# Patient Record
Sex: Female | Born: 1960 | Race: White | Hispanic: No | Marital: Married | Smoking: Never smoker
Health system: Southern US, Community
[De-identification: ages and names within clinical notes are randomized; demographics above are authoritative.]

## PROBLEM LIST (undated history)

## (undated) DIAGNOSIS — Z1501 Genetic susceptibility to malignant neoplasm of breast: Secondary | ICD-10-CM

## (undated) DIAGNOSIS — Z9889 Other specified postprocedural states: Secondary | ICD-10-CM

## (undated) DIAGNOSIS — N6019 Diffuse cystic mastopathy of unspecified breast: Secondary | ICD-10-CM

## (undated) DIAGNOSIS — Z808 Family history of malignant neoplasm of other organs or systems: Secondary | ICD-10-CM

## (undated) DIAGNOSIS — T4145XA Adverse effect of unspecified anesthetic, initial encounter: Secondary | ICD-10-CM

## (undated) DIAGNOSIS — Z515 Encounter for palliative care: Secondary | ICD-10-CM

## (undated) DIAGNOSIS — F329 Major depressive disorder, single episode, unspecified: Secondary | ICD-10-CM

## (undated) DIAGNOSIS — T8859XA Other complications of anesthesia, initial encounter: Secondary | ICD-10-CM

## (undated) DIAGNOSIS — J45909 Unspecified asthma, uncomplicated: Secondary | ICD-10-CM

## (undated) DIAGNOSIS — F32A Depression, unspecified: Secondary | ICD-10-CM

## (undated) DIAGNOSIS — IMO0002 Reserved for concepts with insufficient information to code with codable children: Secondary | ICD-10-CM

## (undated) DIAGNOSIS — J329 Chronic sinusitis, unspecified: Secondary | ICD-10-CM

## (undated) DIAGNOSIS — Z1589 Genetic susceptibility to other disease: Secondary | ICD-10-CM

## (undated) DIAGNOSIS — J302 Other seasonal allergic rhinitis: Secondary | ICD-10-CM

## (undated) DIAGNOSIS — G893 Neoplasm related pain (acute) (chronic): Secondary | ICD-10-CM

## (undated) DIAGNOSIS — S0300XA Dislocation of jaw, unspecified side, initial encounter: Secondary | ICD-10-CM

## (undated) DIAGNOSIS — R112 Nausea with vomiting, unspecified: Secondary | ICD-10-CM

## (undated) HISTORY — DX: Diffuse cystic mastopathy of unspecified breast: N60.19

## (undated) HISTORY — DX: Encounter for palliative care: Z51.5

## (undated) HISTORY — DX: Genetic susceptibility to other disease: Z15.89

## (undated) HISTORY — DX: Genetic susceptibility to malignant neoplasm of breast: Z15.01

## (undated) HISTORY — DX: Reserved for concepts with insufficient information to code with codable children: IMO0002

## (undated) HISTORY — PX: BREAST BIOPSY: SHX20

## (undated) HISTORY — PX: BREAST CYST ASPIRATION: SHX578

## (undated) HISTORY — DX: Chronic sinusitis, unspecified: J32.9

## (undated) HISTORY — DX: Unspecified asthma, uncomplicated: J45.909

## (undated) HISTORY — DX: Other seasonal allergic rhinitis: J30.2

## (undated) HISTORY — PX: LASIK: SHX215

## (undated) HISTORY — DX: Depression, unspecified: F32.A

## (undated) HISTORY — DX: Dislocation of jaw, unspecified side, initial encounter: S03.00XA

## (undated) HISTORY — DX: Neoplasm related pain (acute) (chronic): G89.3

## (undated) HISTORY — DX: Major depressive disorder, single episode, unspecified: F32.9

## (undated) HISTORY — DX: Family history of malignant neoplasm of other organs or systems: Z80.8

## (undated) MED FILL — Fosaprepitant Dimeglumine For IV Infusion 150 MG (Base Eq): INTRAVENOUS | Qty: 5 | Status: AC

---
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
## 1989-02-23 HISTORY — PX: TUBAL LIGATION: SHX77

## 1989-09-30 DIAGNOSIS — O321XX0 Maternal care for breech presentation, not applicable or unspecified: Secondary | ICD-10-CM

## 1994-02-23 HISTORY — PX: RHINOPLASTY: SUR1284

## 1999-02-24 DIAGNOSIS — J45909 Unspecified asthma, uncomplicated: Secondary | ICD-10-CM

## 1999-02-24 HISTORY — PX: OTHER SURGICAL HISTORY: SHX169

## 1999-02-24 HISTORY — DX: Unspecified asthma, uncomplicated: J45.909

## 2001-09-21 IMAGING — MG MM CAD DIAGNOSTIC MAMMO
1 series · 6 of 6 positions shown · non-contrast
Comparison: none

REASON FOR EXAM: Left brt mass...hm [PHONE_NUMBER]...[HOSPITAL] EMP CAT 2

[Series 7520: R CC · right · 6 of 6 slices shown]
[im 1/6]
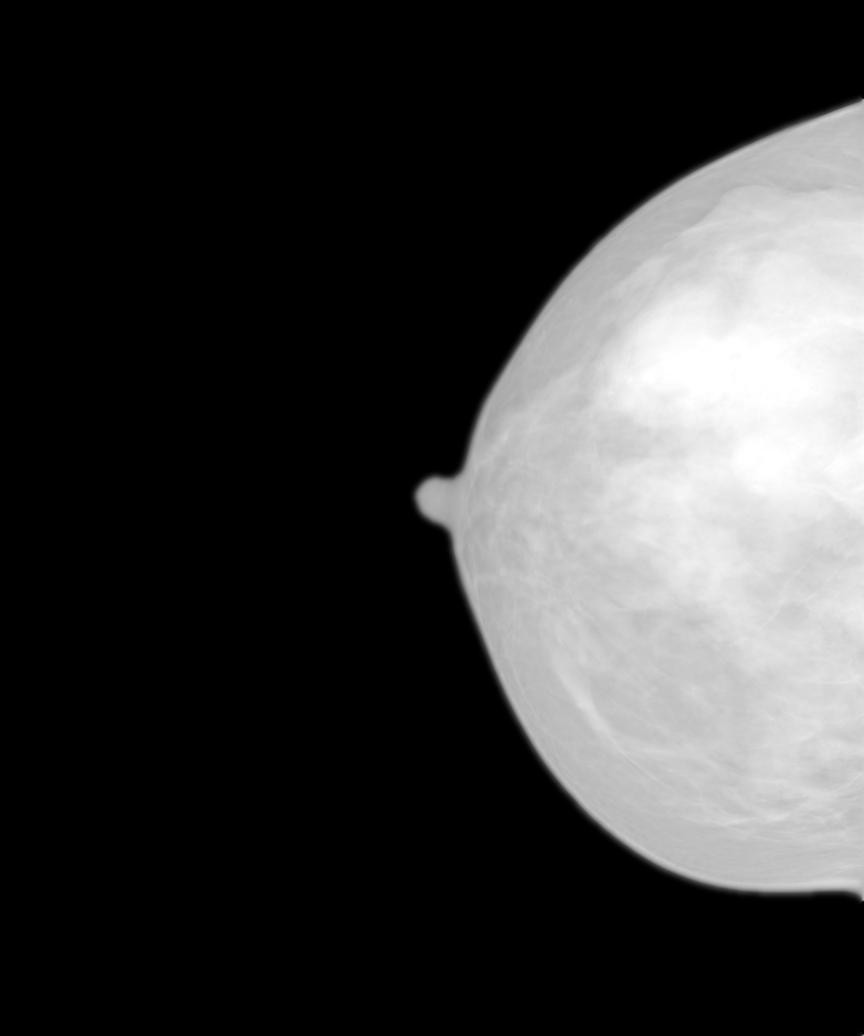
[im 2/6]
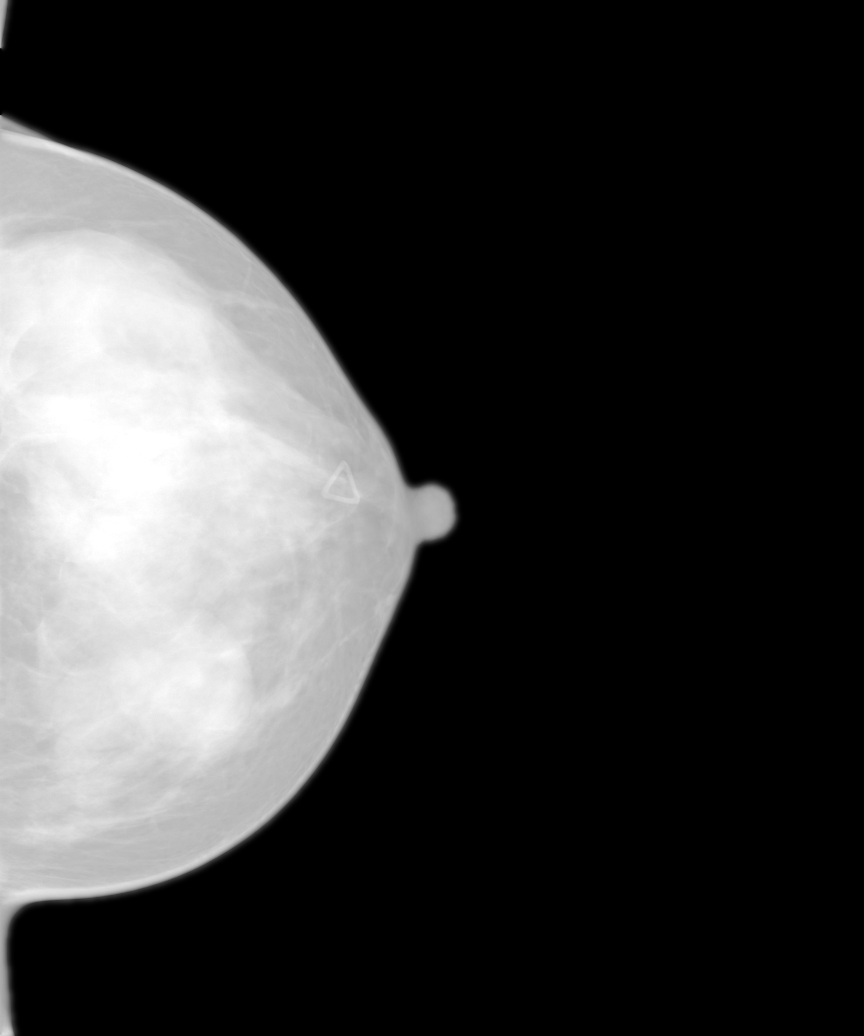
[im 3/6]
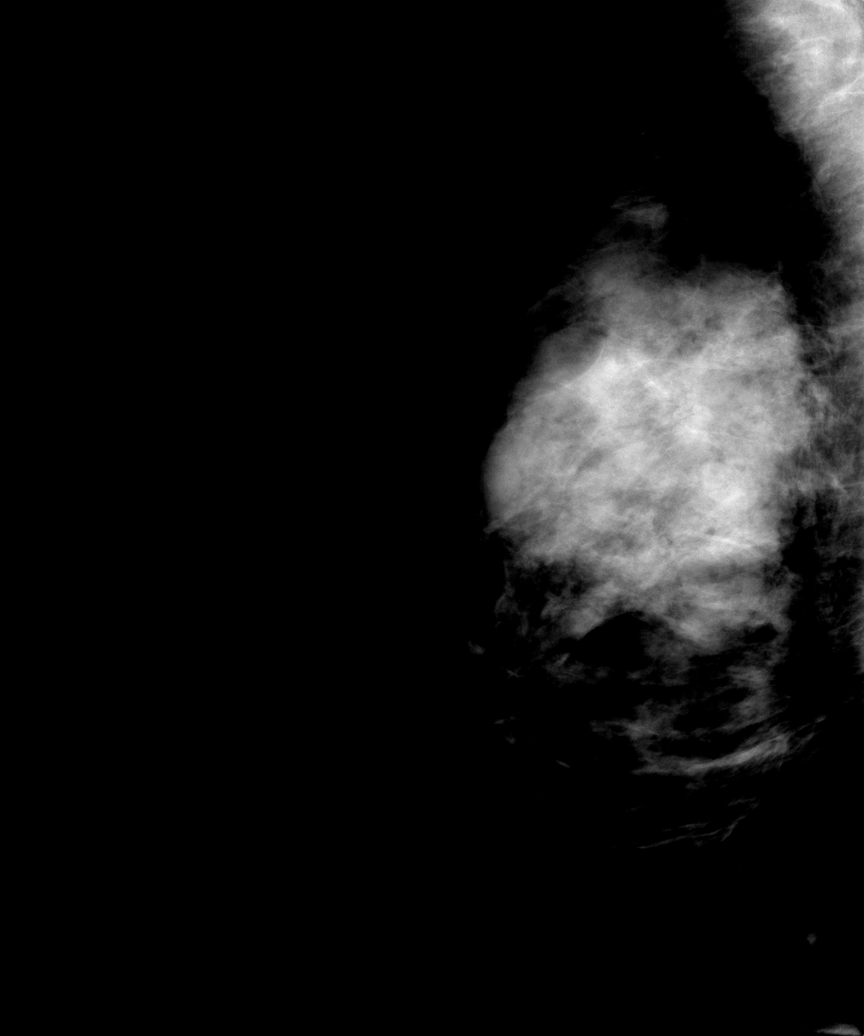
[im 4/6]
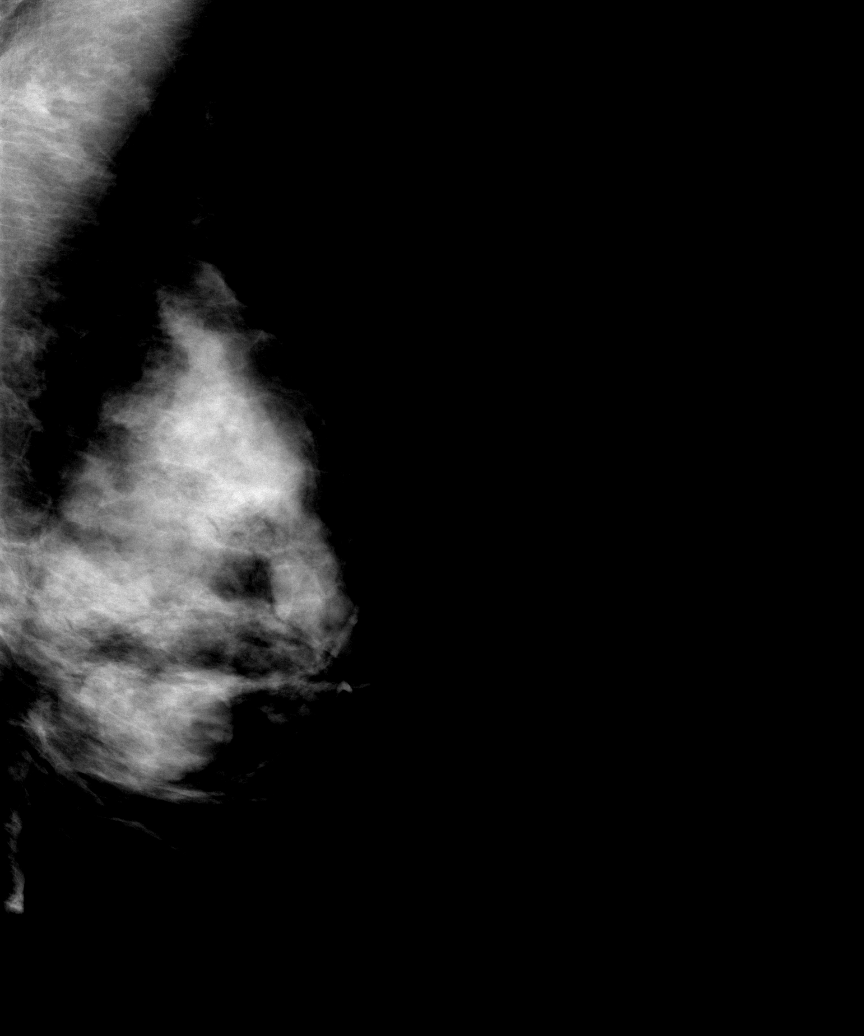
[im 5/6]
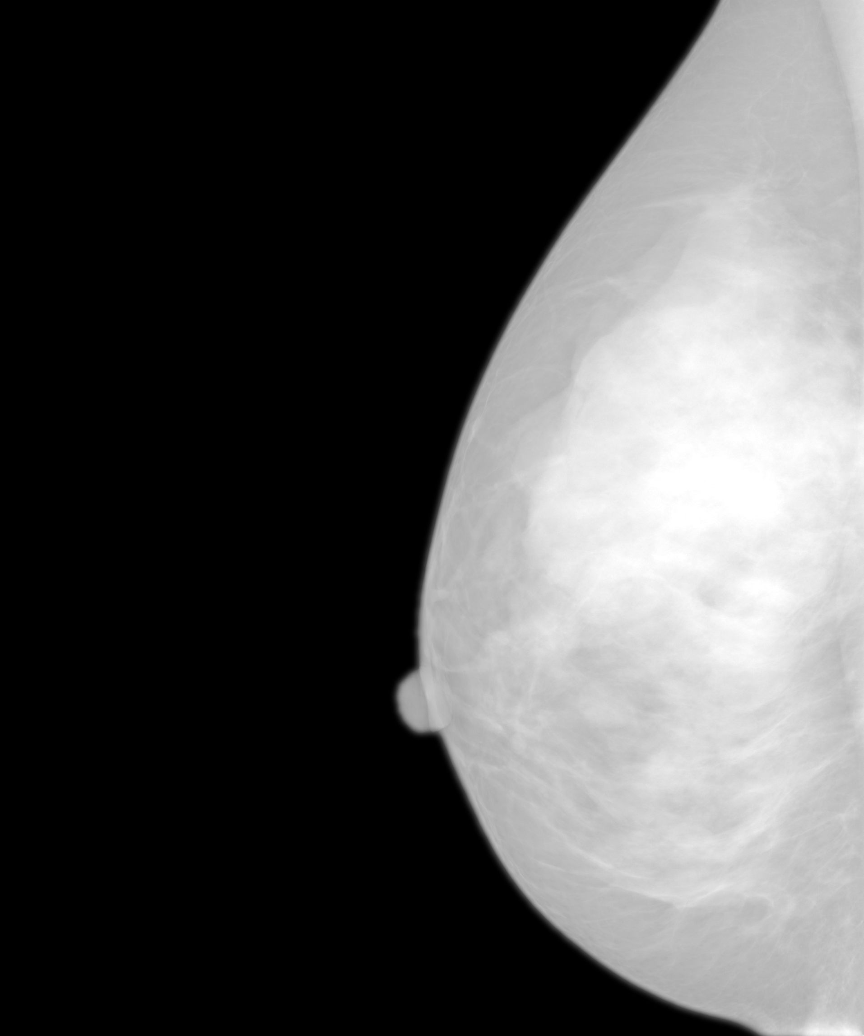
[im 6/6]
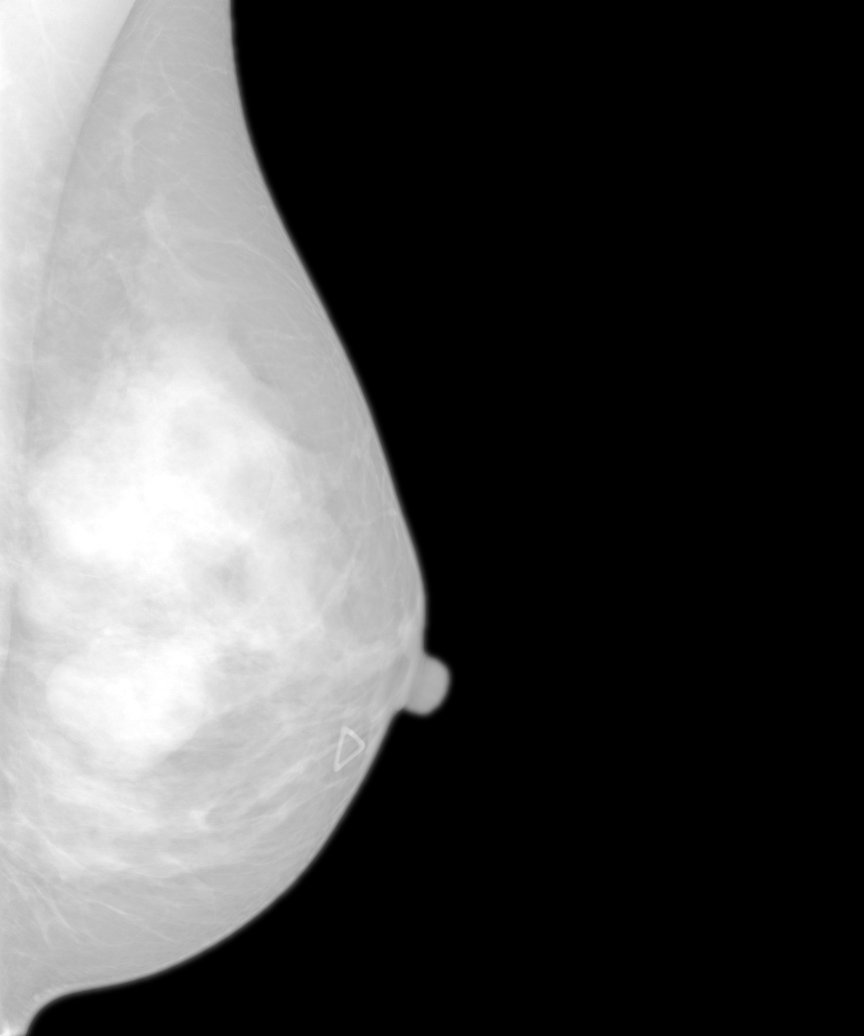

[6 of 6 positions shown; findings below may reference images not displayed]

Procedure: DIGITAL BILATERAL DIAGNOSTIC MAMMOGRAPHY WITH CAD

 Noted is a new prominent mass lesion in the LEFT breast inferiorly and
medially measuring approximately 3.0 cm consistent with a large cyst. This
was confirmed by ultrasound. Multiple tiny cysts are present throughout the
remainder of the breast. There are multiple RIGHT and LEFT breast masses
which are stable.
IMPRESSION: Large cyst, LEFT breast confirmed by ultrasound. Otherwise stable exam with
multiple masses bilaterally consistent with multiple cysts. BI-RADS:
Category 2-Benign Findings.
 A NEGATIVE MAMMOGRAM REPORT DOES NOT PRECLUDE BIOPSY OR OTHER EVALUATION OF
A CLINICALLY PALPABLE OR OTHERWISE SUSPICIOUS MASS OR LESION. BREAST CANCER
MAY NOT BE DETECTED BY MAMMOGRAPHY IN UP TO 10% OF CASES.

## 2003-01-15 IMAGING — MG MM CAD SCREENING MAMMO
1 series · 6 of 6 positions shown · non-contrast
Comparison: none

REASON FOR EXAM: screening

Procedure: BILATERAL DIGITAL SCREENING MAMMOGRAPHY WITH CAD

[R CC · right · 6 of 6 slices shown]
[im 1/6]
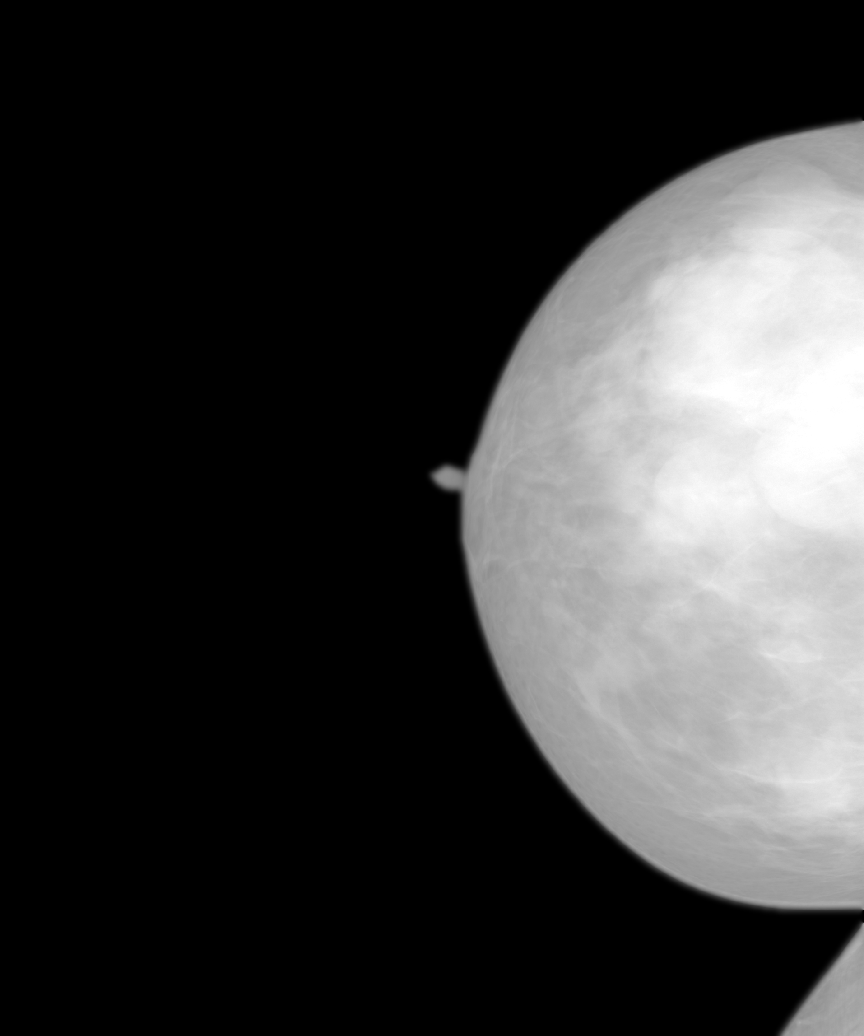
[im 2/6]
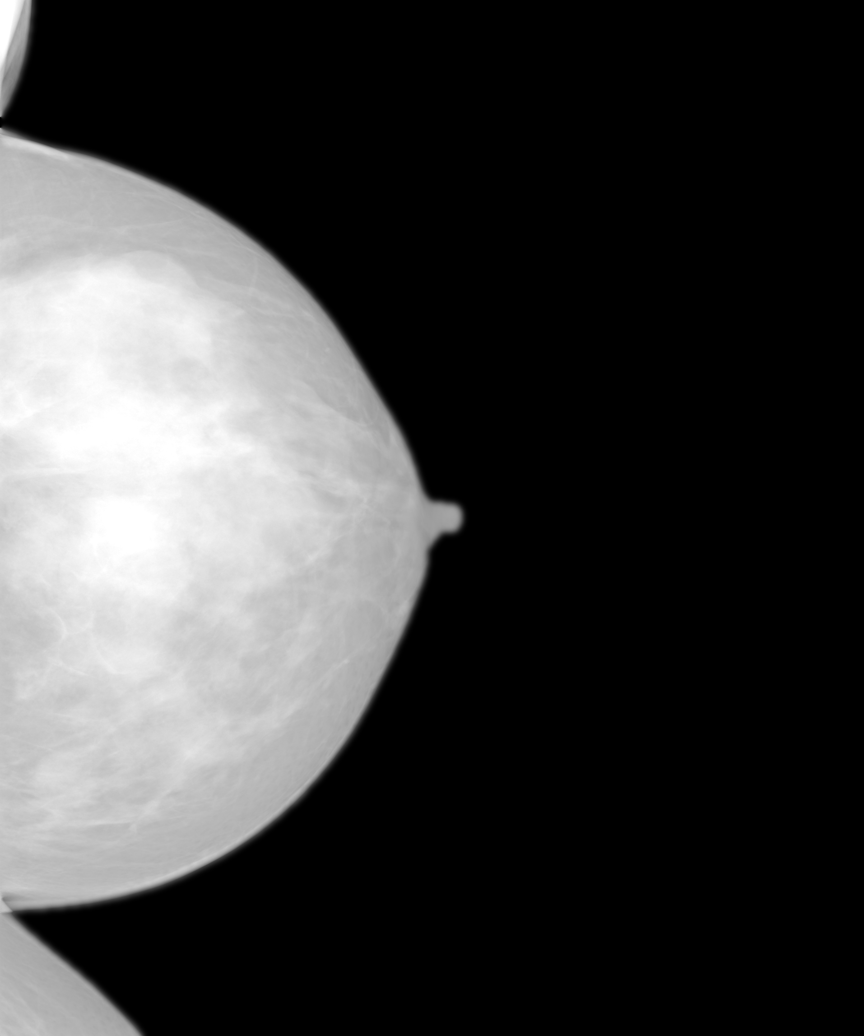
[im 3/6]
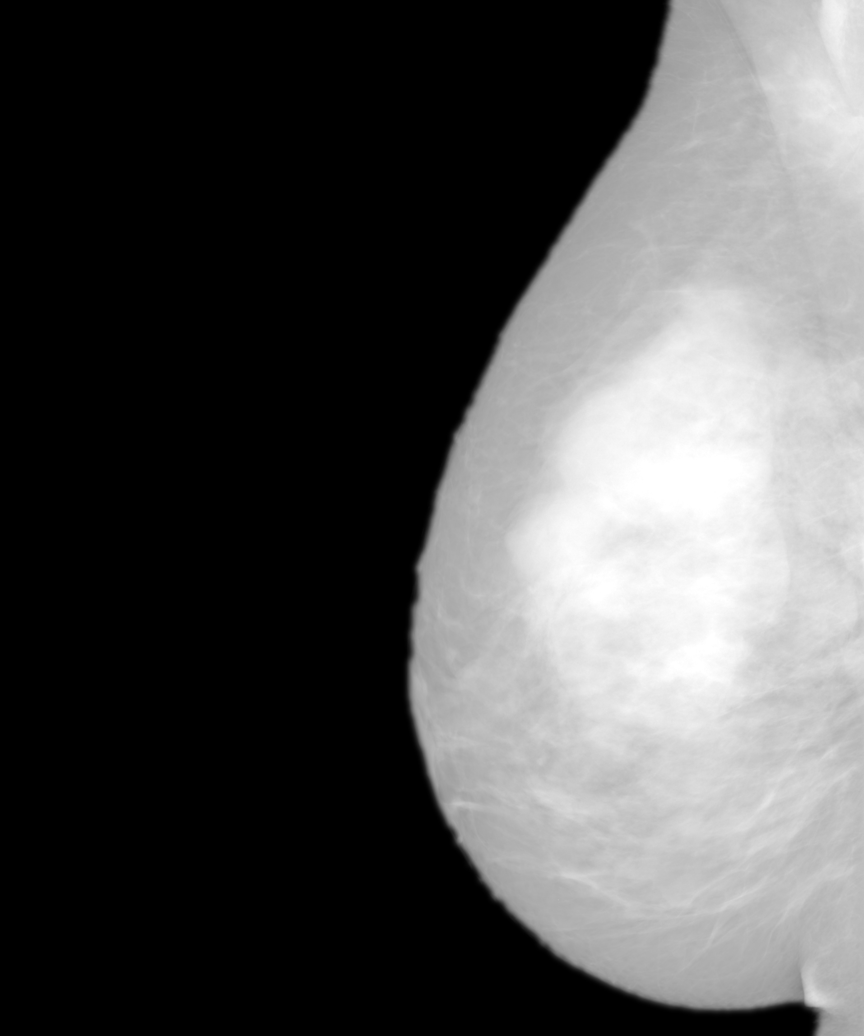
[im 4/6]
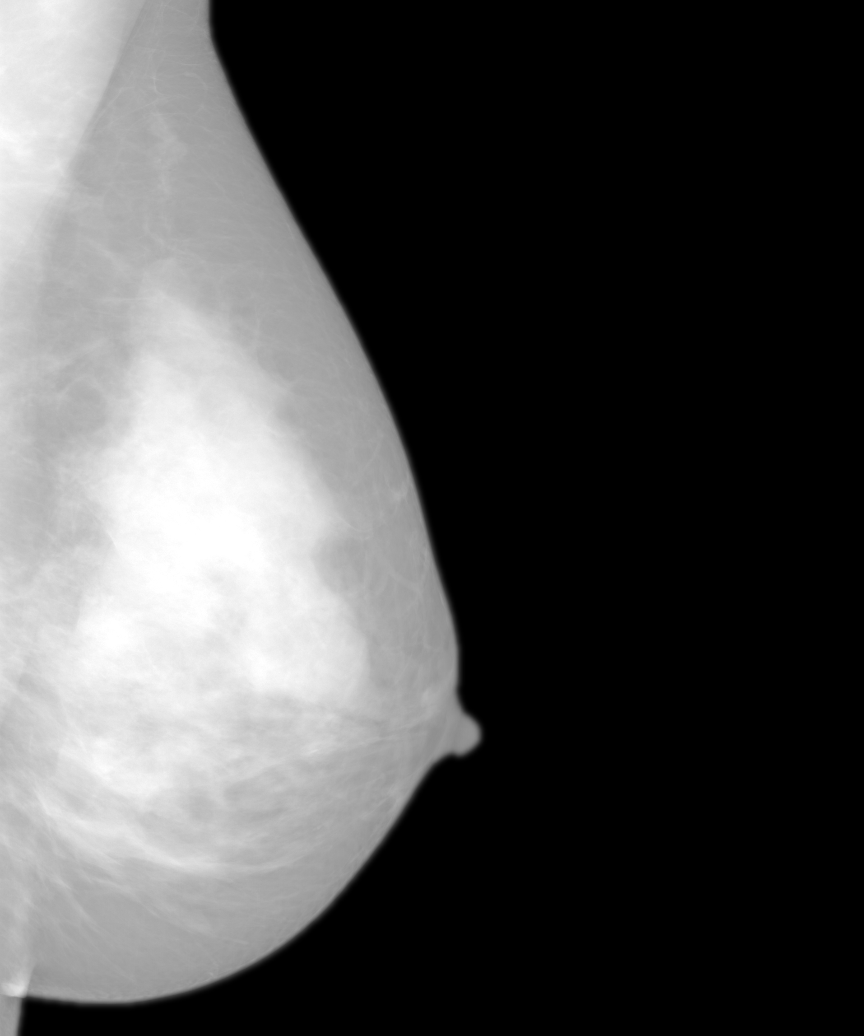
[im 5/6]
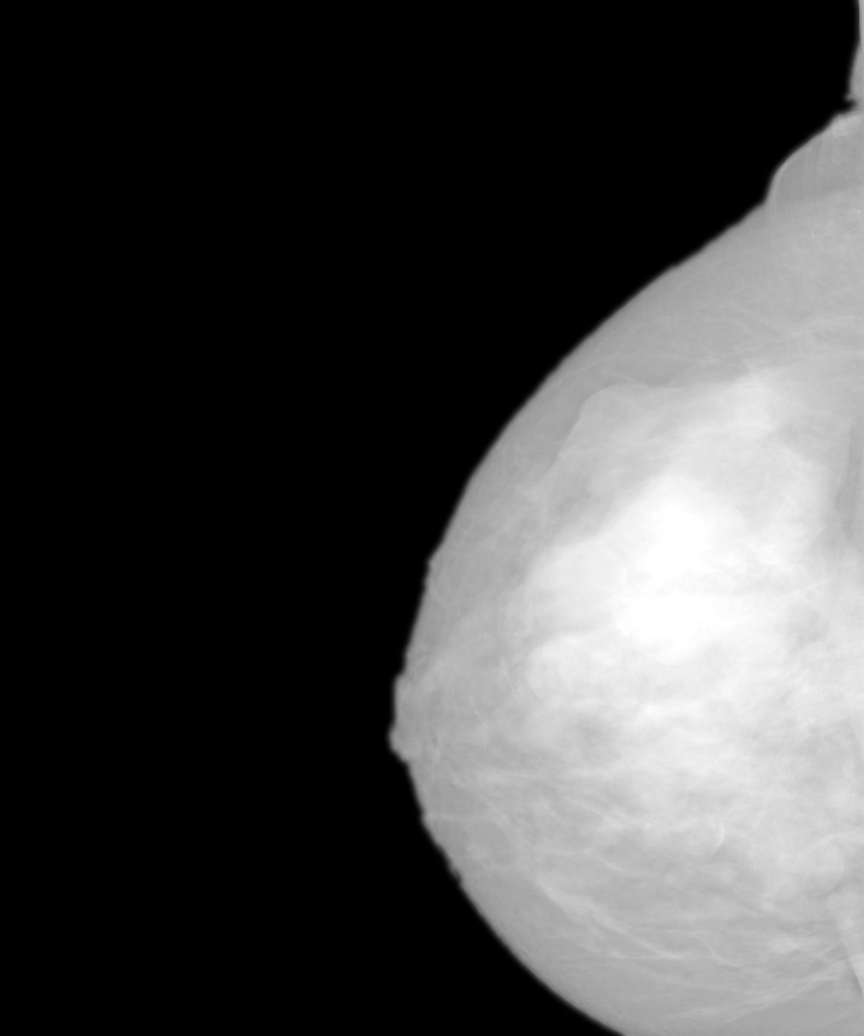
[im 6/6]
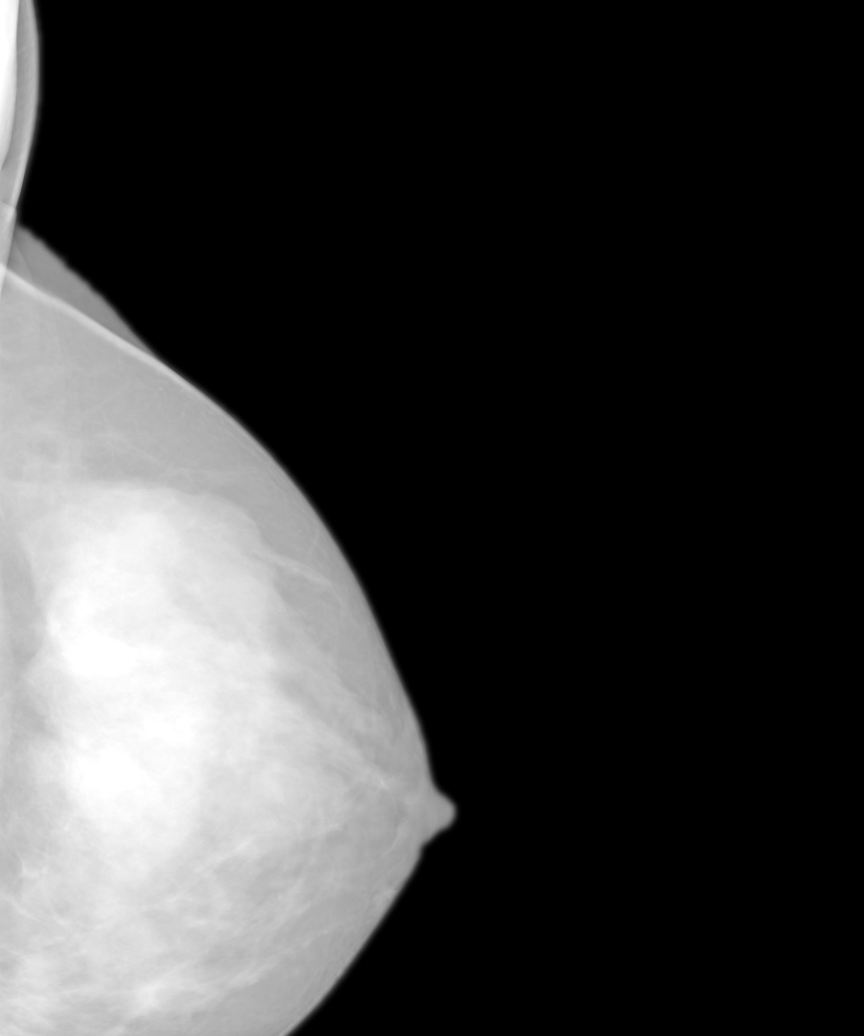

[6 of 6 positions shown; findings below may reference images not displayed]

FINDINGS: Comparison is made to the study of [DATE] as well as to the
film screen exam of [DATE].

 The breasts exhibit a dense parenchymal pattern which limits the
sensitivity for early detection of malignancy or subtle change compared to
prior exams. The CC image on todays examination shows an area of slightly
increased density with some associated calcifications that appear to be
slightly different than that seen on the prior studies. This may be
secondary to projection or differences in compression. Additional
magnification compression views of this area would be suggested. The area is
marked on the CC view and stored electronically. A similar appearing density
is seen on the MLO view and additional magnification compression in that
projection is also recommended.
IMPRESSION: Area of slightly increased parenchymal density seen on the current scan
which is not definitely correlated with areas on the prior study. This may
be secondary to differences in compression compared to the prior exam and
additional magnification compression views of the RIGHT breast would be
suggested in the areas indicated. Mediolateral view of the RIGHT breast
would also be considered.

 BI-RADS: Category 0 - Needs Additional Imaging Evaluation. Please have the
patient return for additional views of the RIGHT breast.

 A NEGATIVE MAMMOGRAM REPORT DOES NOT PRECLUDE BIOPSY OR OTHER EVALUATION OF
CLINICALLY PALPABLE OR OTHERWISE SUSPICIOUS MASS OR LESION. BREAST CANCER
MAY NOT BE DETECTED BY MAMMOGRAPHY IN UP TO 10% OF CASES.

## 2003-02-05 IMAGING — MG MM ADDITIONAL VIEWS AT NO CHARGE
1 series · 3 of 3 positions shown · non-contrast
Comparison: none

REASON FOR EXAM: DENSITY..cat 2

Procedure: DIGITAL RIGHT BREAST MAMMOGRAM, ADDITIONAL VIEWS

[R ML · right · 3 of 3 slices shown]
[im 1/3]
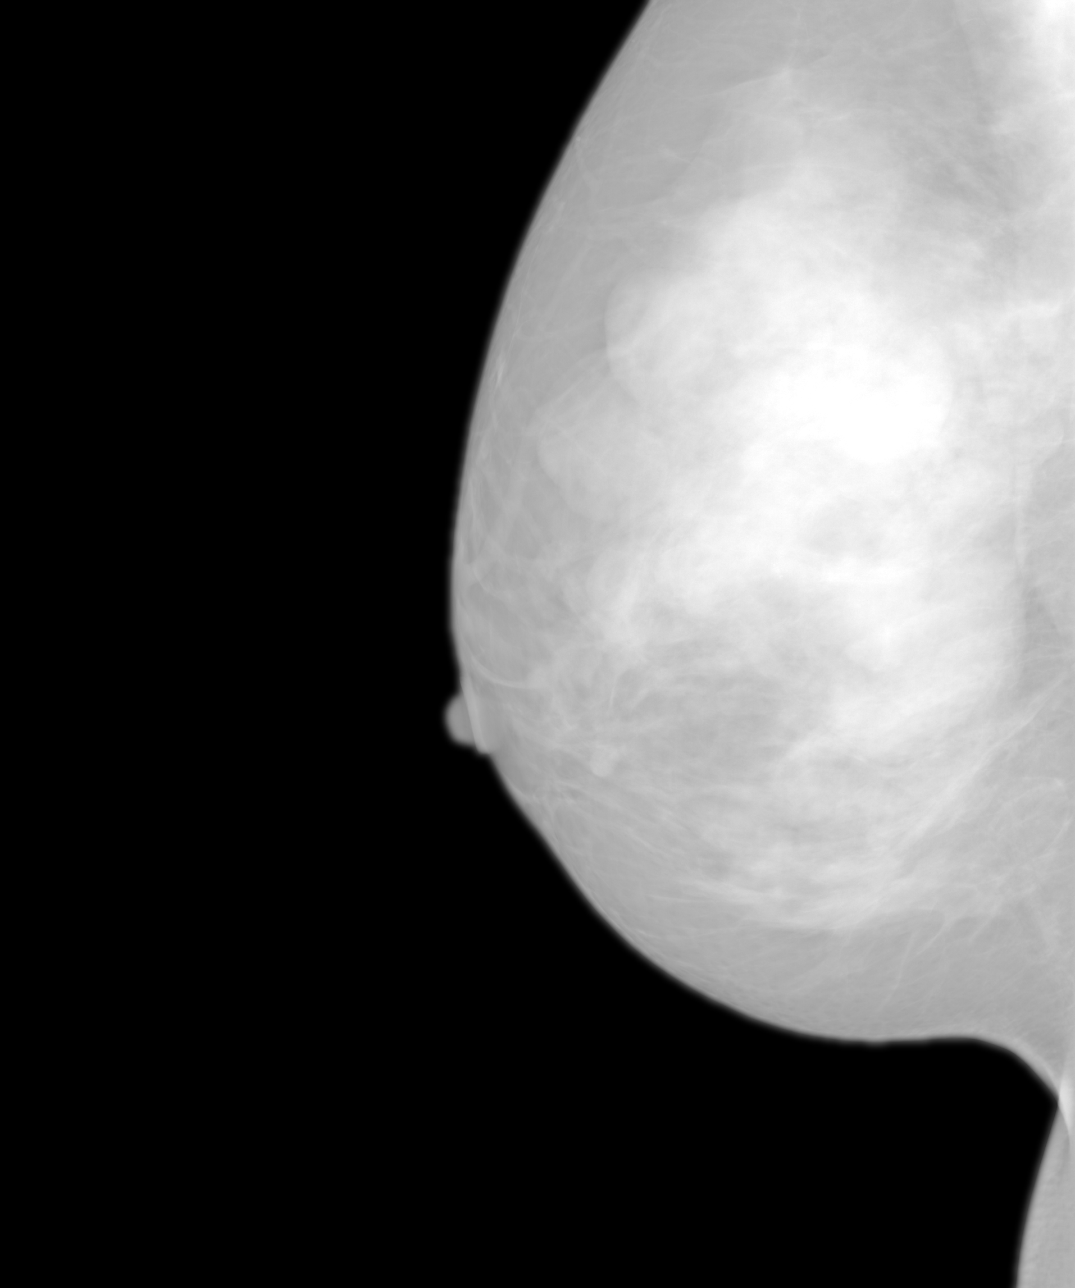
[im 2/3]
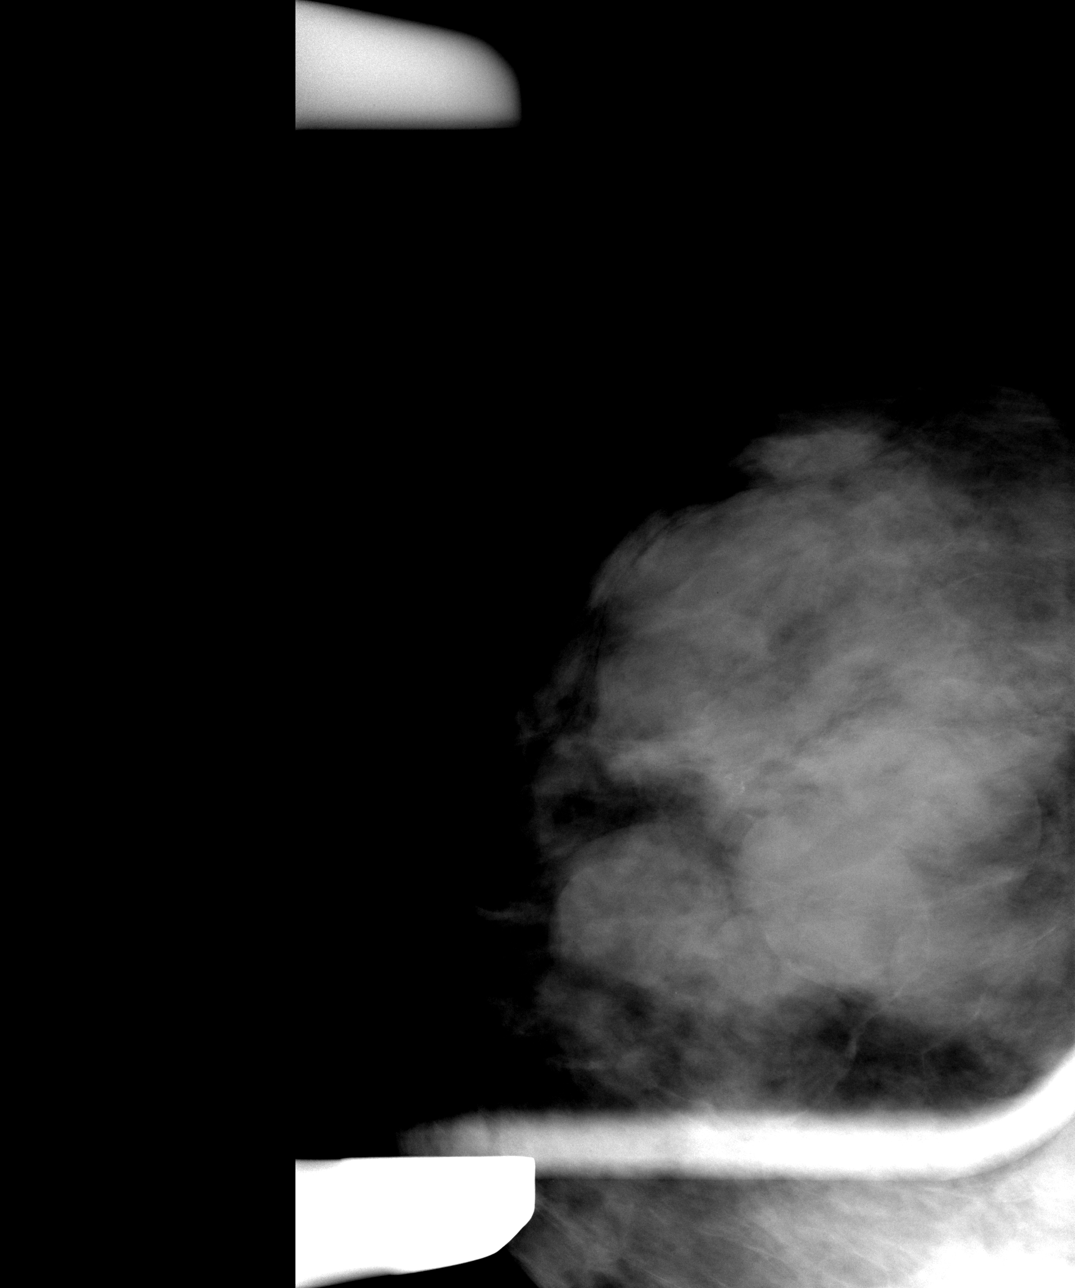
[im 3/3]
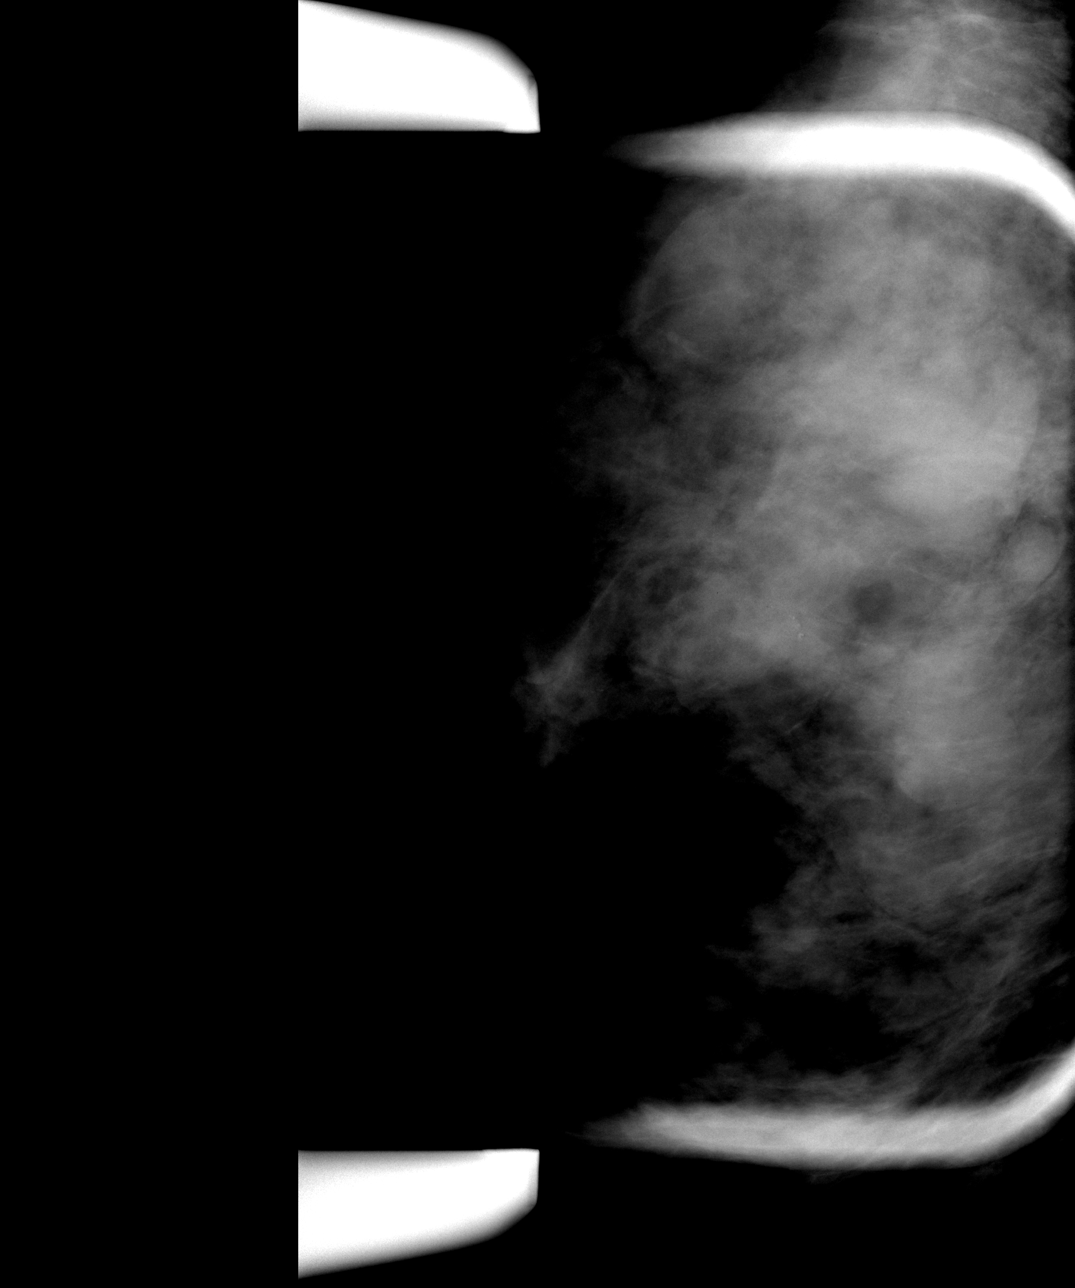

[3 of 3 positions shown; findings below may reference images not displayed]

FINDINGS: The patient returned for additional mediolateral and
magnification compression mediolateral and craniocaudal projections.
Ultrasound was also performed.

 The additional views demonstrate microcalcifications which are faintly seen
in the area of what appear to be some architectural distortion in the upper
outer quadrant. This appears to be at approximately the 9 oclock to 10
oclock position. The breasts show a dense parenchymal configuration. The
ultrasound demonstrates multiple cysts as described in a separate dictation.
IMPRESSION: Abnormal additional views with microcalcifications present at approximately
the 9 oclock position in the RIGHT breast. <pp>
 BI-RADS: Category 4 - Suspicious Abnormality. Surgical consultation is
suggested.

 A NEGATIVE MAMMOGRAM REPORT DOES NOT PRECLUDE BIOPSY OR OTHER EVALUATION OF
CLINICALLY PALPABLE OR OTHERWISE SUSPICIOUS MASS OR LESION. BREAST CANCER
MAY NOT BE DETECTED BY MAMMOGRAPHY IN UP TO 10% OF CASES.

## 2003-03-02 IMAGING — MG MM BREAST NEEDLE LOCALIZATION*R*
1 series · 3 of 3 positions shown · non-contrast
Comparison: none

REASON FOR EXAM: breast mass microcalcifications

[R LM · right · 3 of 3 slices shown]
[im 1/3]
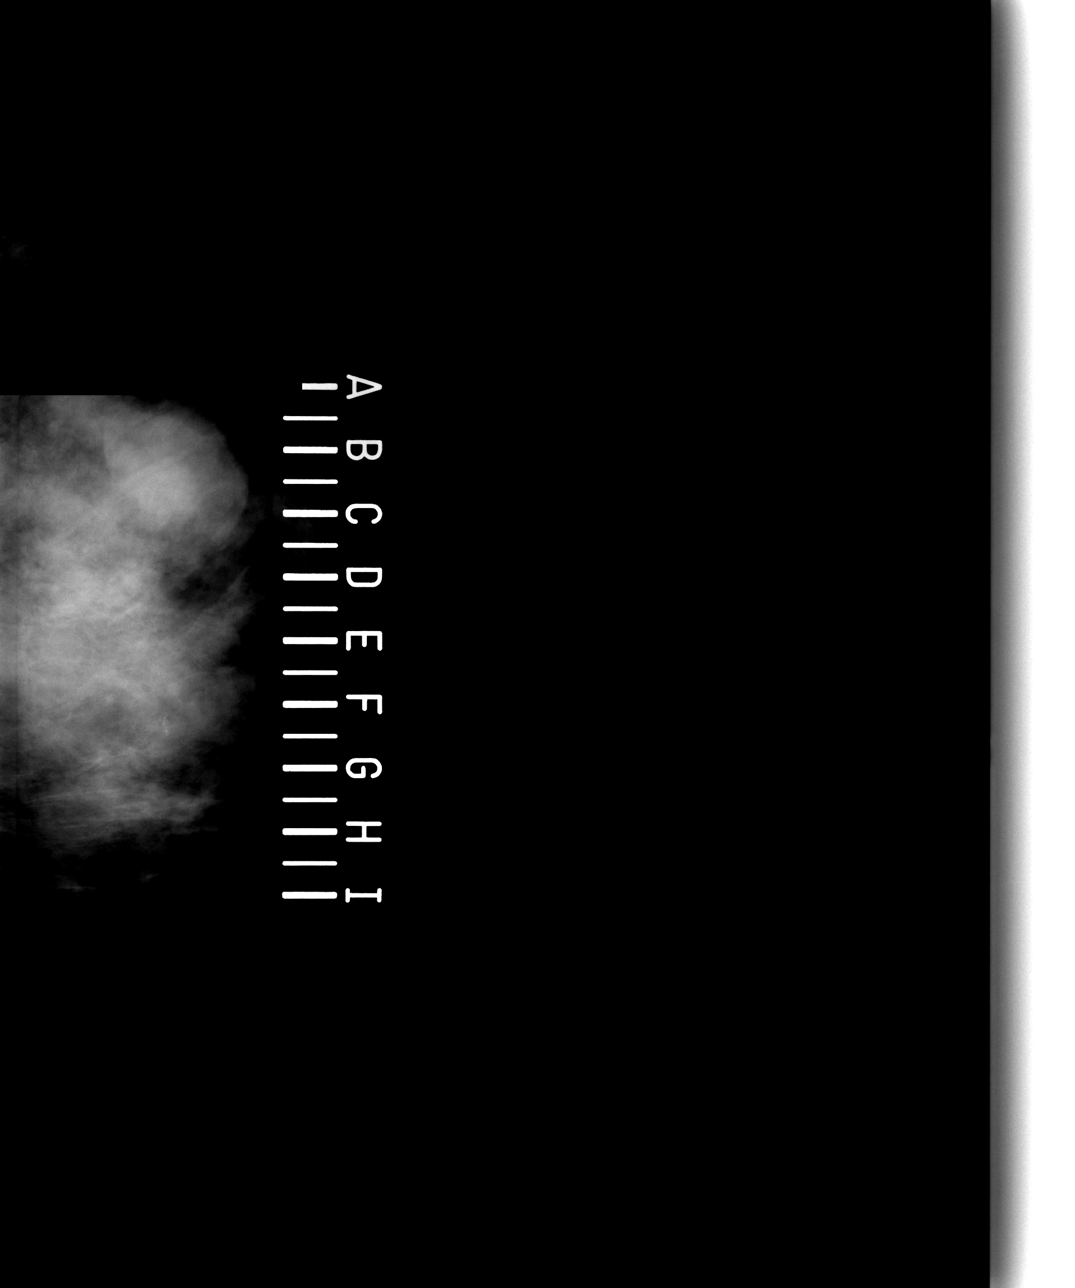
[im 2/3]
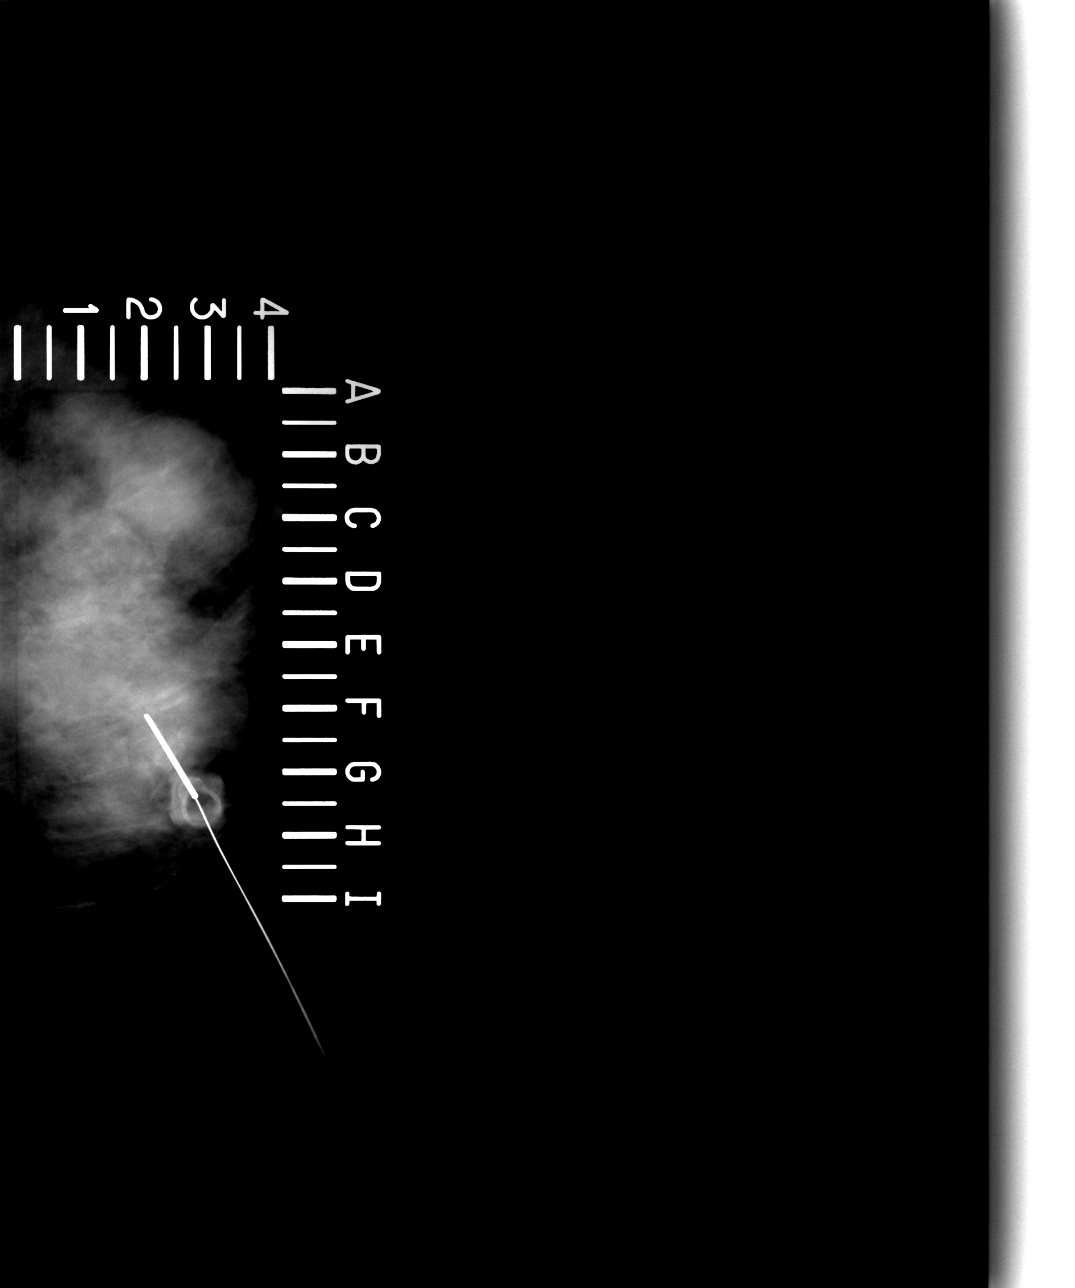
[im 3/3]
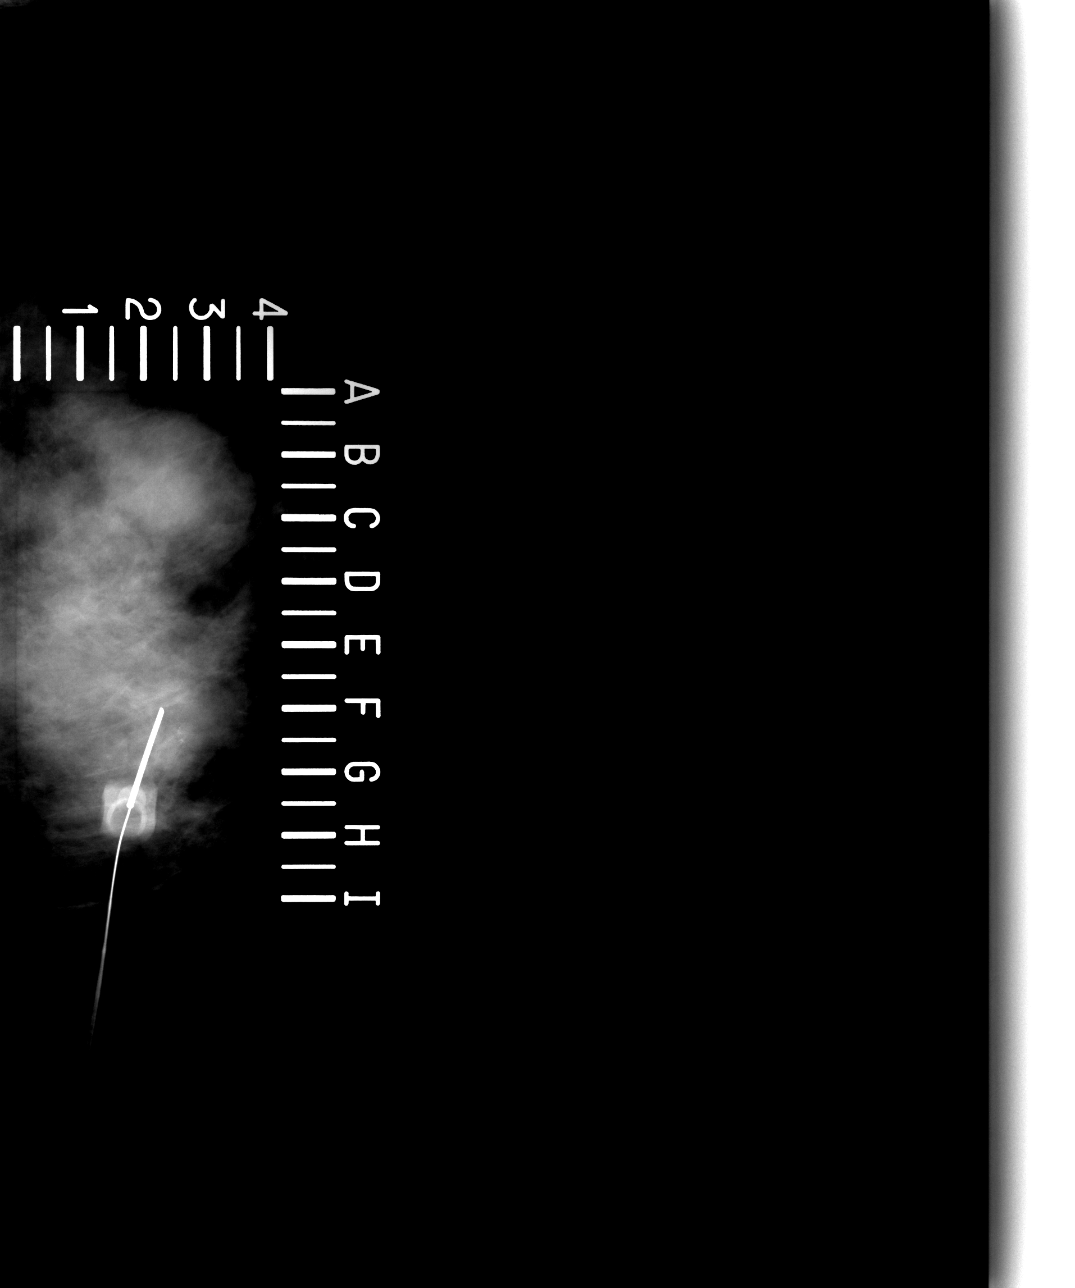

[3 of 3 positions shown; findings below may reference images not displayed]

Procedure: RIGHT BREAST NEEDLE LOCALIZATION

 REASON: Calcifications.

 The patient was scheduled for needle localization of microcalcifications of
the RIGHT breast and subsequent surgical removal. The patient was prepped
and draped in a sterile manner. Appropriate site for needle localization was
determined. However, immediately following administration of local
anesthetic, the patient began feeling dizzy and experienced a vasovagal
reaction. Immediately compression was withdrawn from the breast and she was
placed in the supine position. Immediately the procedure was terminated. The
patient was monitored for a half an hour before refusing to have another
attempt at needle localization. The procedure will have to be rescheduled.
She was driven home by her sister. The operating room and Dr. SANESI were
informed of the developments.

## 2003-07-20 IMAGING — MG MM MAMMO DIAGNOSTIC UNILATERAL*R*
1 series · 3 of 3 positions shown · non-contrast
Comparison: none

REASON FOR EXAM: Calcifications Rt Breast Status Post Biopsy

[R CC · right · 3 of 3 slices shown]
[im 1/3]
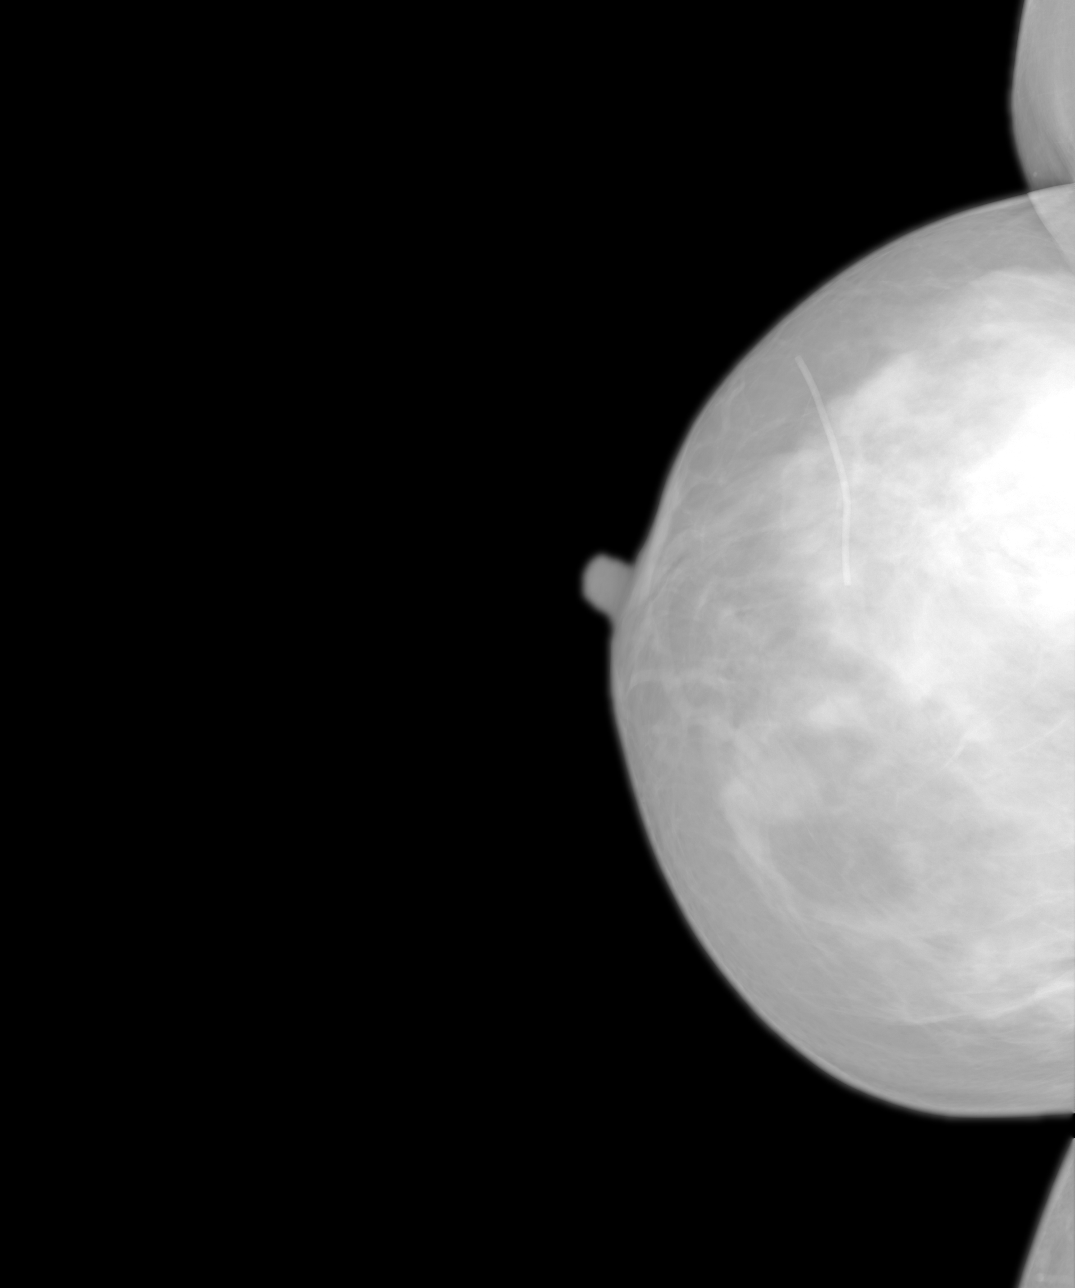
[im 2/3]
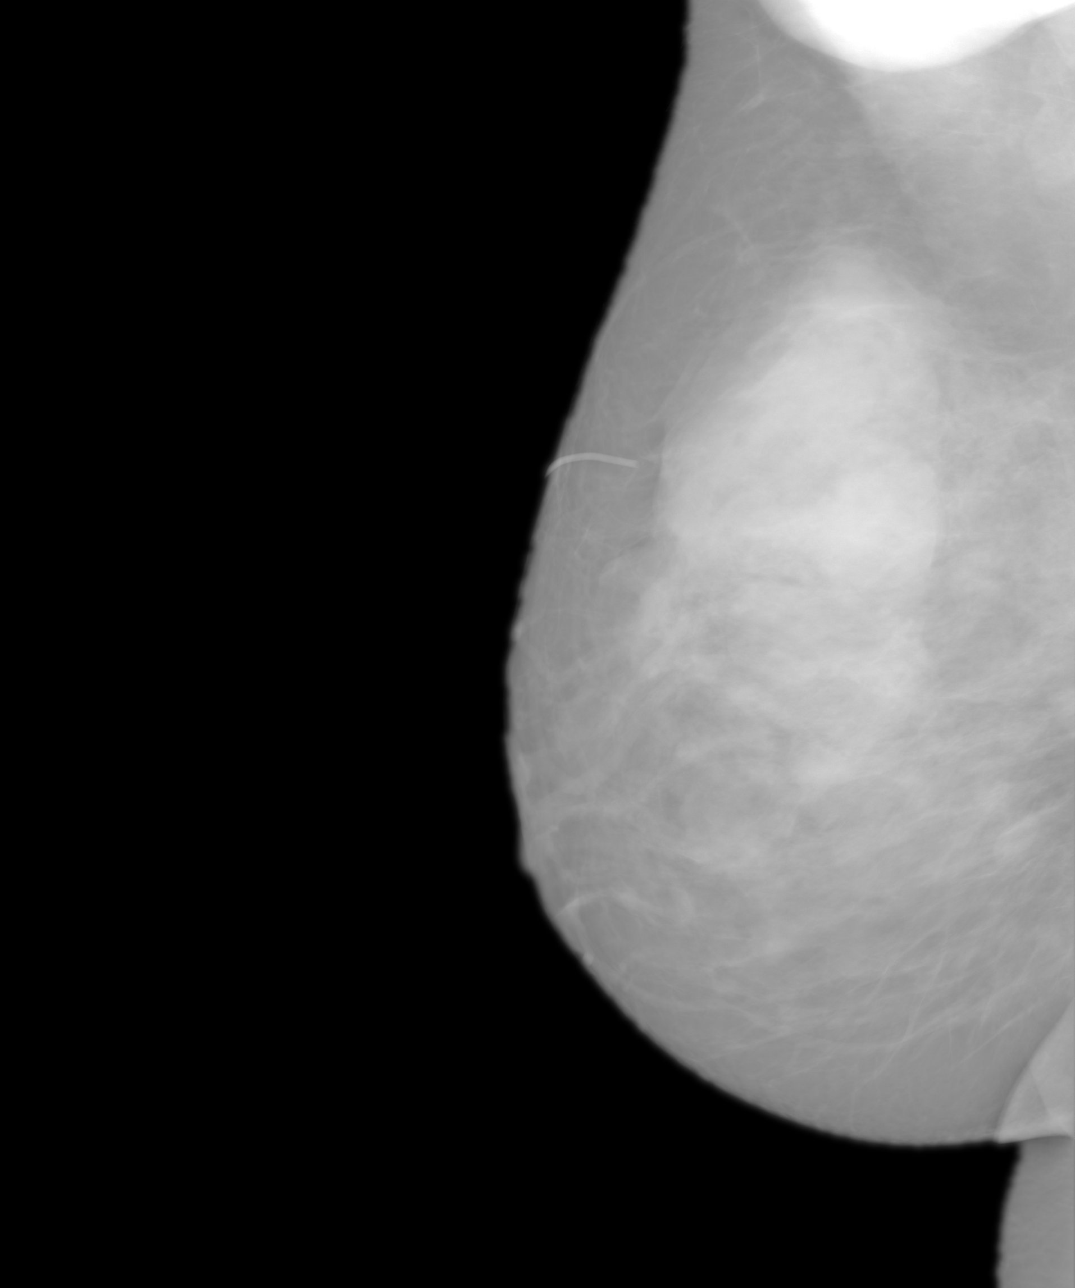
[im 3/3]
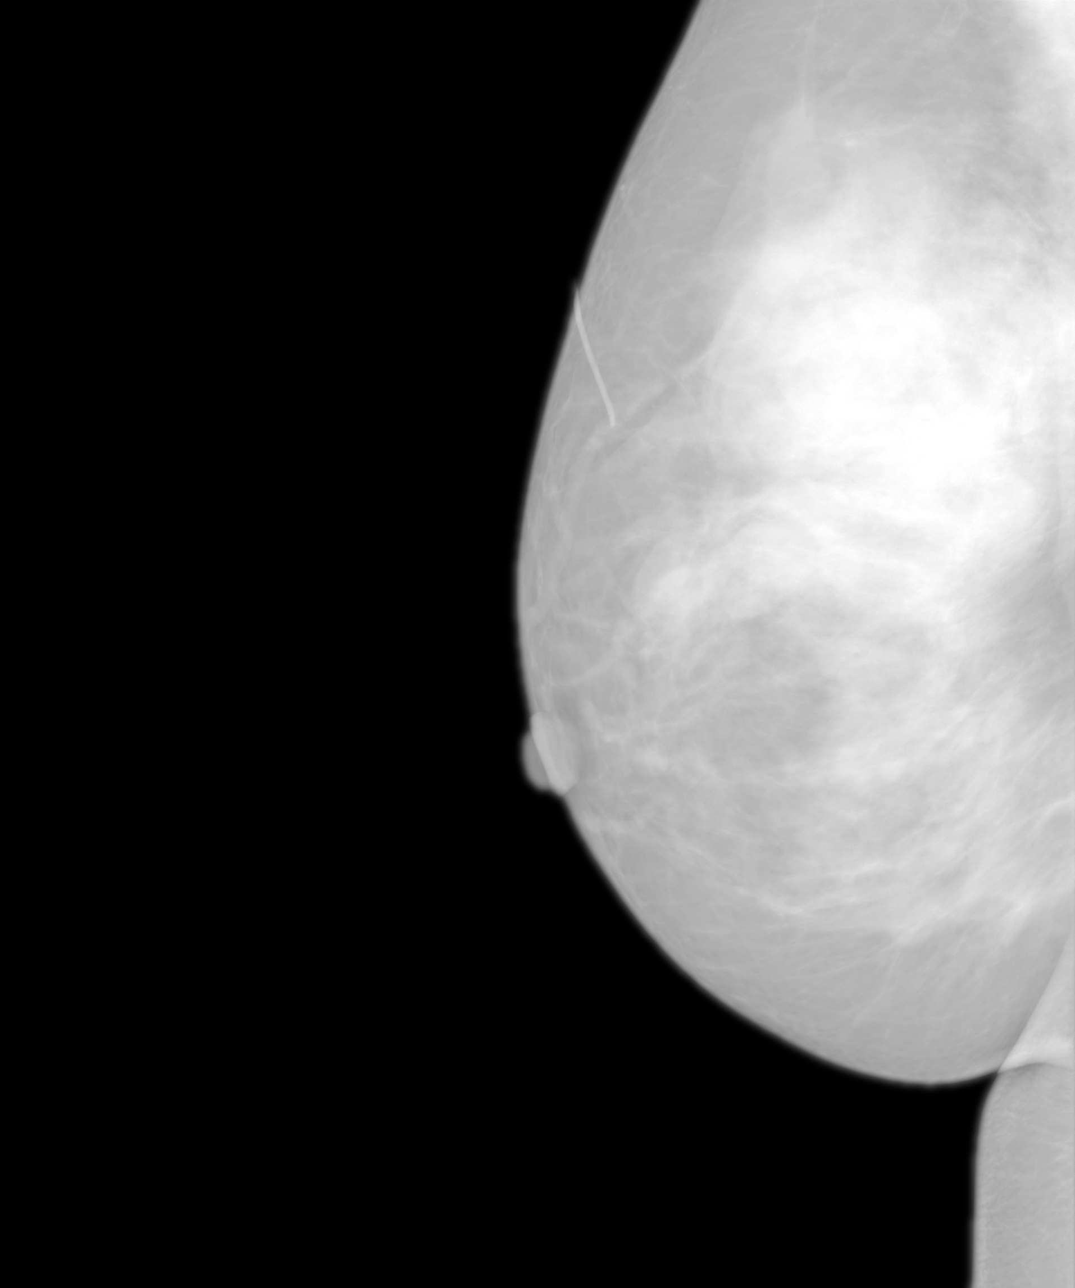

[3 of 3 positions shown; findings below may reference images not displayed]

Procedure: DIGITAL UNILATERAL RIGHT BREAST MAMMOGRAM

 REASON: Calcifications.

 The patient is 42 years old and shows no personal or family history of
breast cancer. She does not take hormones. The patient has had recent needle
localization of her calcifications in the RIGHT breast.

 The RIGHT breast shows predominantly dense fibroglandular tissue with the
overall parenchymal pattern remaining stable when compared to a prior study
from [DATE]. There are post surgical changes seen in the RIGHT breast but
no new mass, calcifications or architectural distortion is identified.

 RECOMMENDATON: The patient should return for bilateral screening mammogram
in 6 months.

 BI-RADS: Category 3 - Probably Benign Finding (interval follow-up)

 A NEGATIVE MAMMOGRAM REPORT DOES NOT PRECLUDE BIOPSY OR OTHER EVALUATION OF
CLINICALLY PALPABLE OR OTHERWISE SUSPICIOUS MASS OR LESION. BREAST CANCER
MAY NOT BE DETECTED BY MAMMOGRAPHY IN UP TO 10% OF CASES.

## 2003-12-31 ENCOUNTER — Ambulatory Visit: Payer: Self-pay | Admitting: General Surgery

## 2003-12-31 IMAGING — MG MM CAD DIAGNOSTIC MAMMO
1 series · 7 of 7 positions shown · non-contrast
Comparison: none

REASON FOR EXAM: density  CAT 2
COMMENTS:

[R CC · right · 7 of 7 slices shown]
[im 1/7]
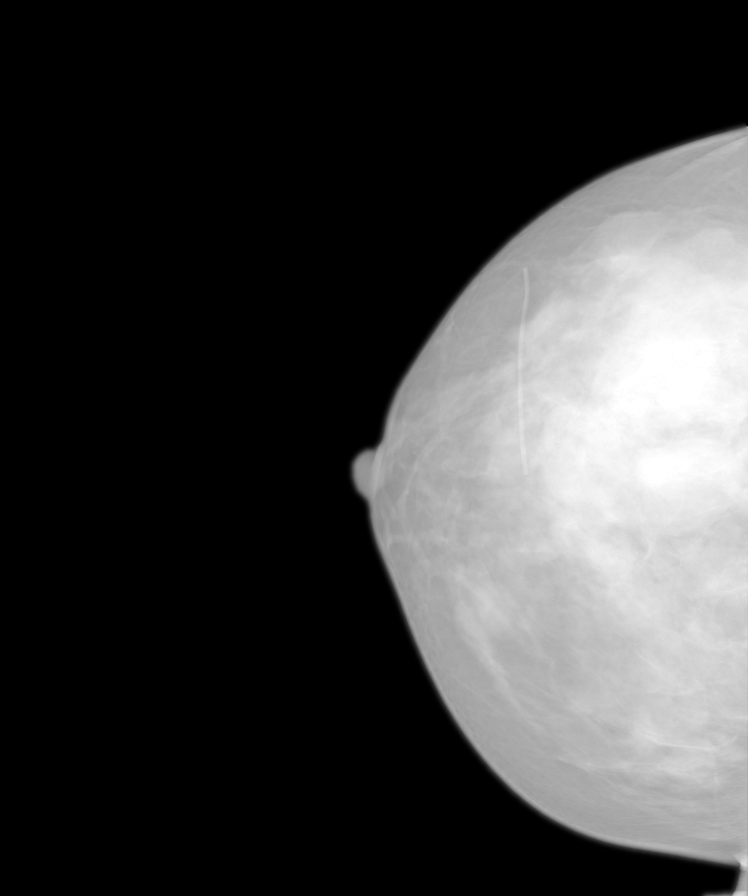
[im 2/7]
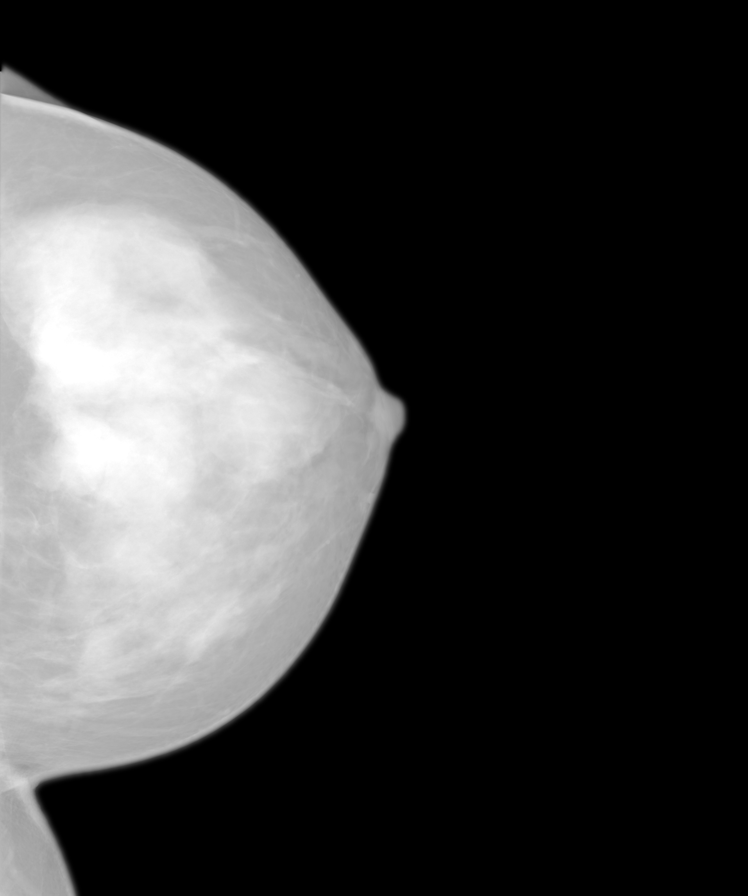
[im 3/7]
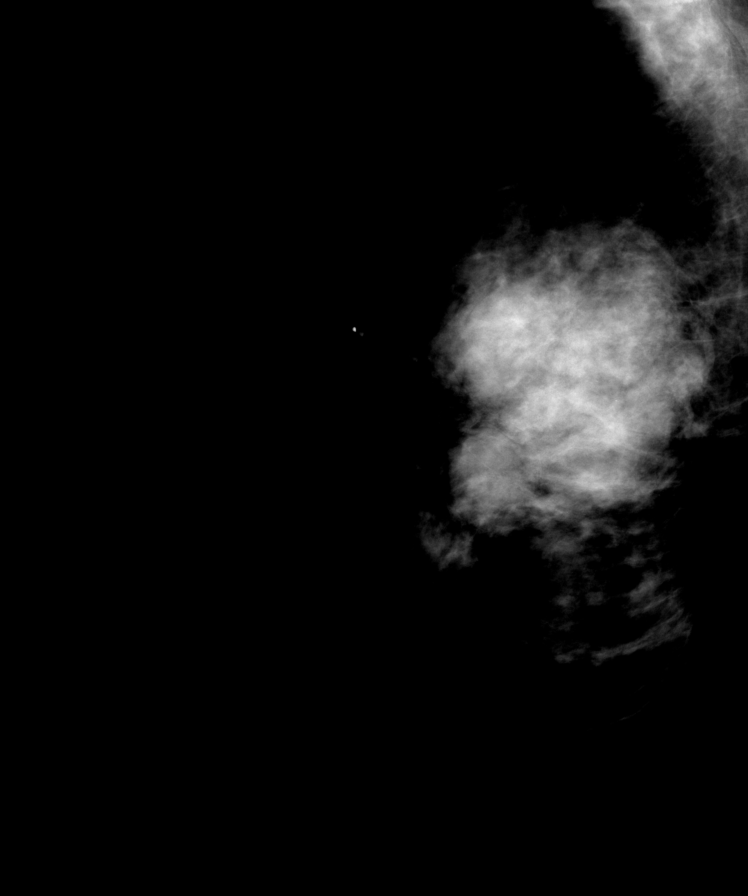
[im 4/7]
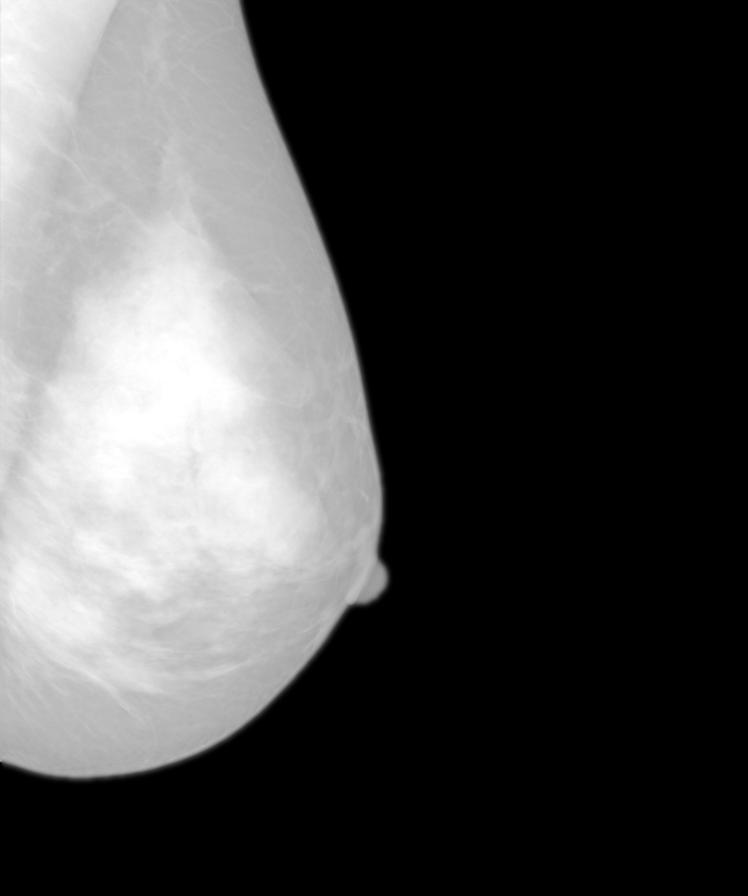
[im 5/7]
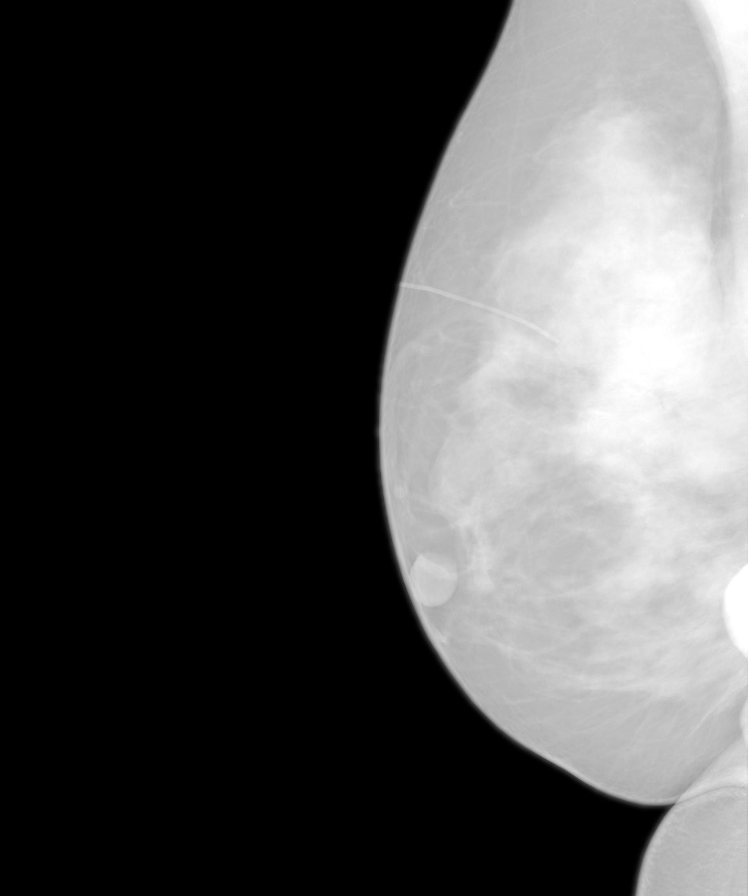
[im 6/7]
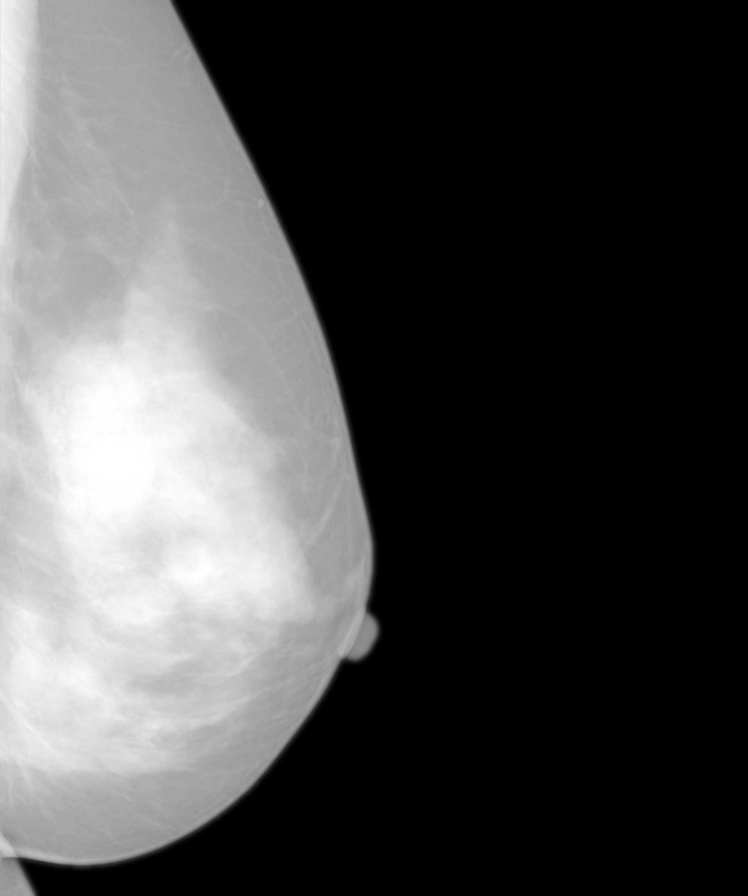
[im 7/7]
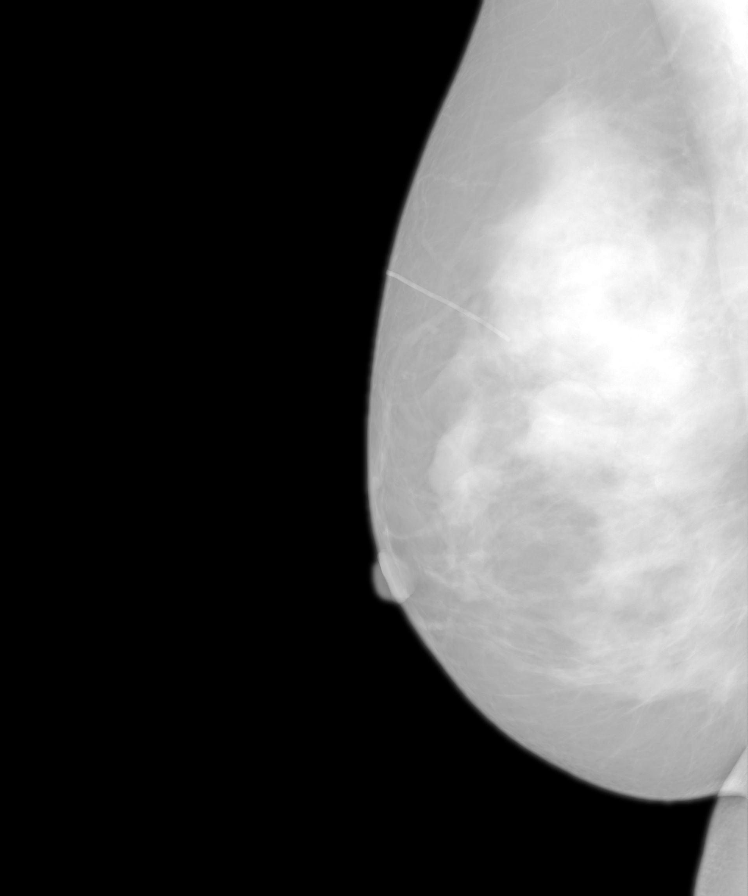

[7 of 7 positions shown; findings below may reference images not displayed]

PROCEDURE:     MAM - MAM DGTL DIAGNOSTIC MAMMO W/CAD  - [DATE]  [DATE]

RESULT:        The current exam is compared to the previous RIGHT mammogram
of [DATE] and the bilateral mammogram of [DATE].  The examination of
[DATE] is also reviewed.   The breast parenchyma is dense and
nonhomogenous.  There are again noted multiple rounded densities which could
either represent masses or dense fibroglandular tissue.  These do not have
appear to have changed appreciably and likely are secondary to fibrocystic
change.  Ultrasound of the upper outer quadrant of both breasts is
recommended for further evaluation if such has not already been performed.
No malignant appearing microcalcifications are seen.
IMPRESSION: 1.     No malignant appearing calcifications are identified.
2.     The breasts are very dense and nonhomogenous.  Ultrasound of the
upper outer quadrant of both breasts are recommended since mass lesions
cannot be excluded mammographically.
3.     BI-RADS:  Category 0- Needs Additional Imaging Evaluation.

A NEGATIVE MAMMOGRAM REPORT DOES NOT PRECLUDE BIOPSY OR OTHER EVALUATION OF
A CLINICALLY PALPABLE OR OTHERWISE SUSPICIOUS MASS OR LESION.   BREAST

## 2004-11-24 ENCOUNTER — Ambulatory Visit: Payer: Self-pay | Admitting: General Surgery

## 2005-11-27 ENCOUNTER — Ambulatory Visit: Payer: Self-pay | Admitting: General Surgery

## 2005-11-27 IMAGING — MG MM CAD DIAGNOSTIC MAMMO
1 series · 6 of 6 positions shown · non-contrast
Comparison: none

REASON FOR EXAM: Fibrocystic disease yearly
COMMENTS:

[R CC · right · 6 of 6 slices shown]
[im 1/6]
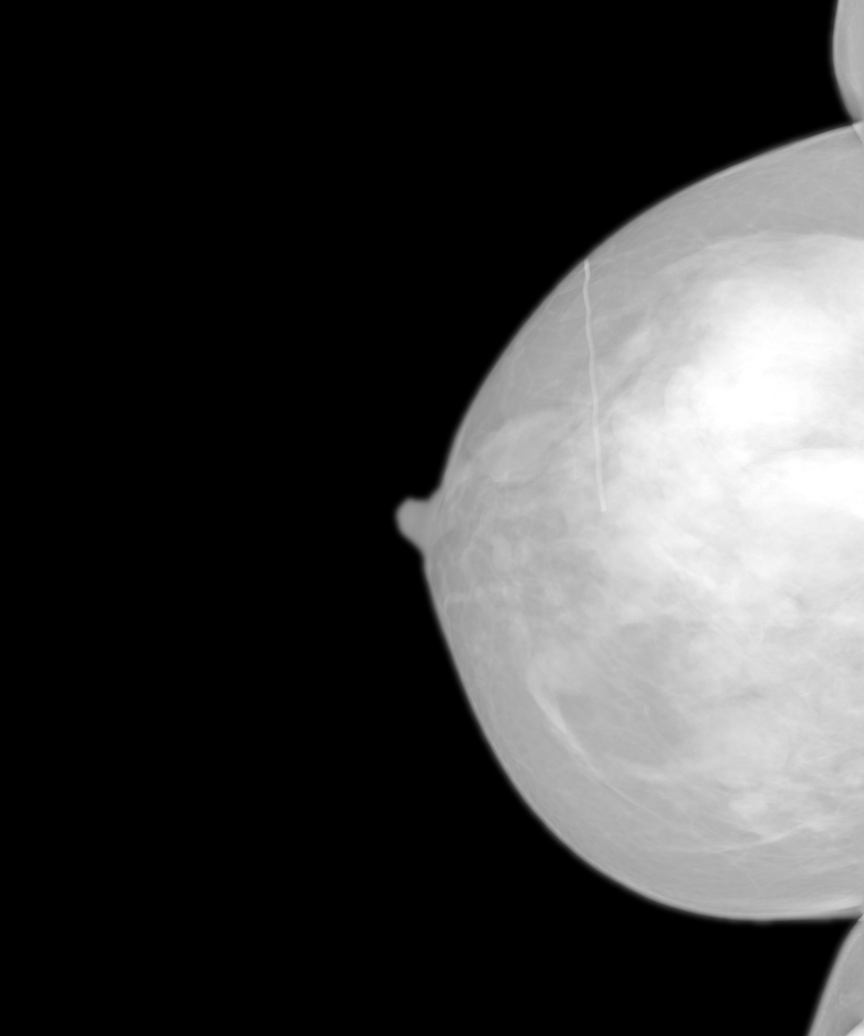
[im 2/6]
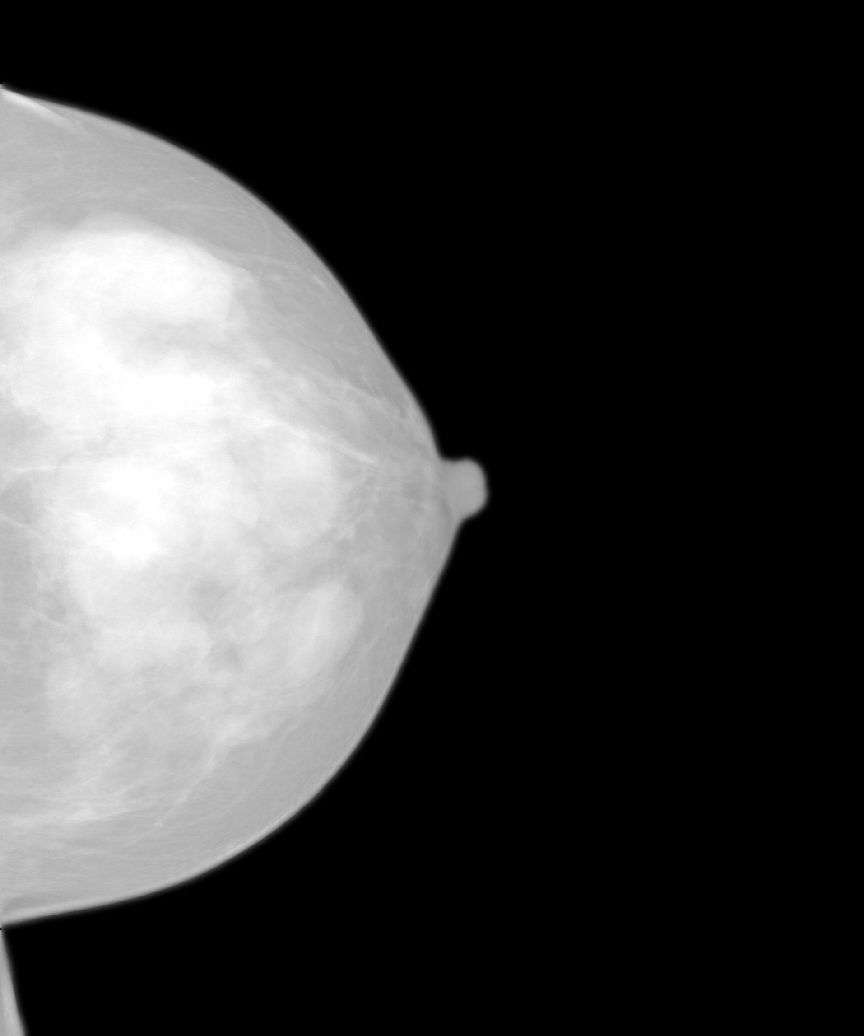
[im 3/6]
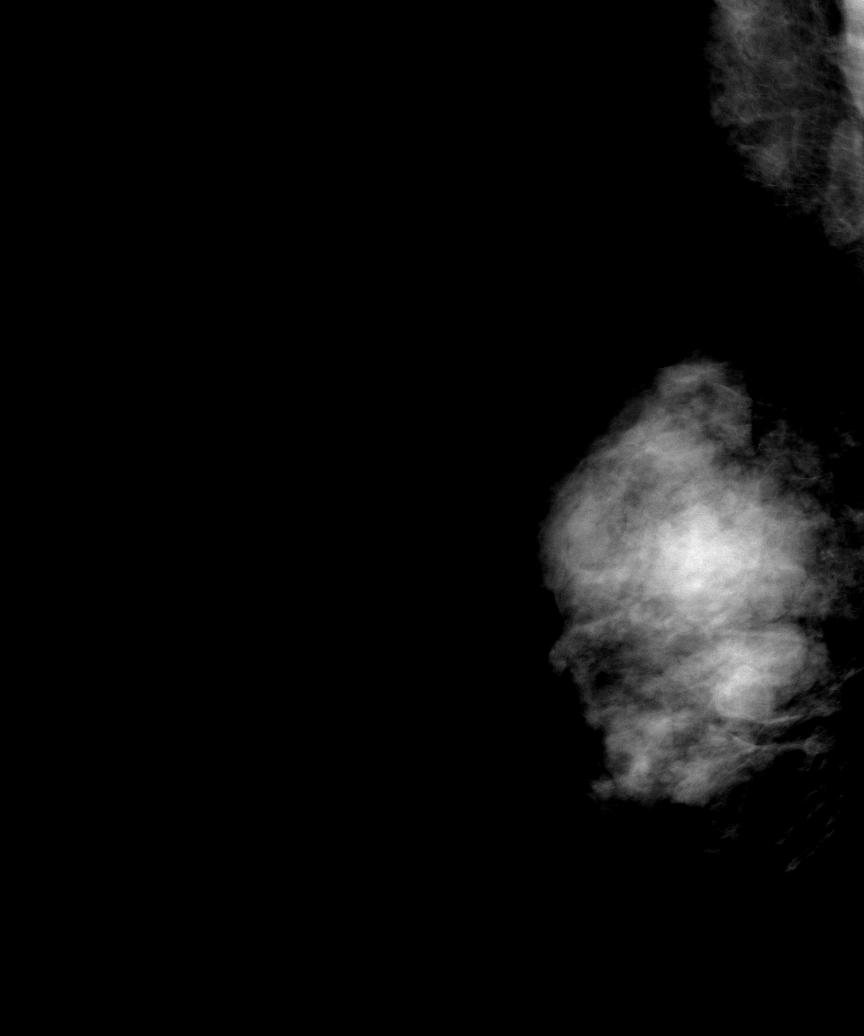
[im 4/6]
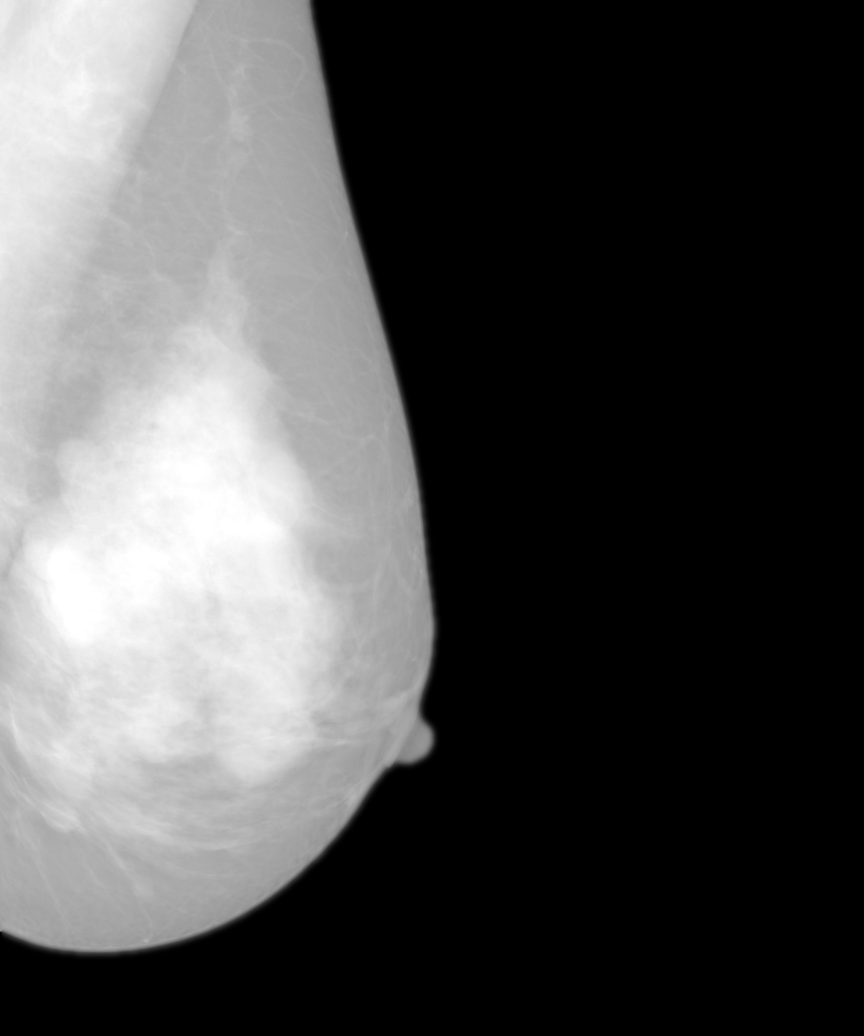
[im 5/6]
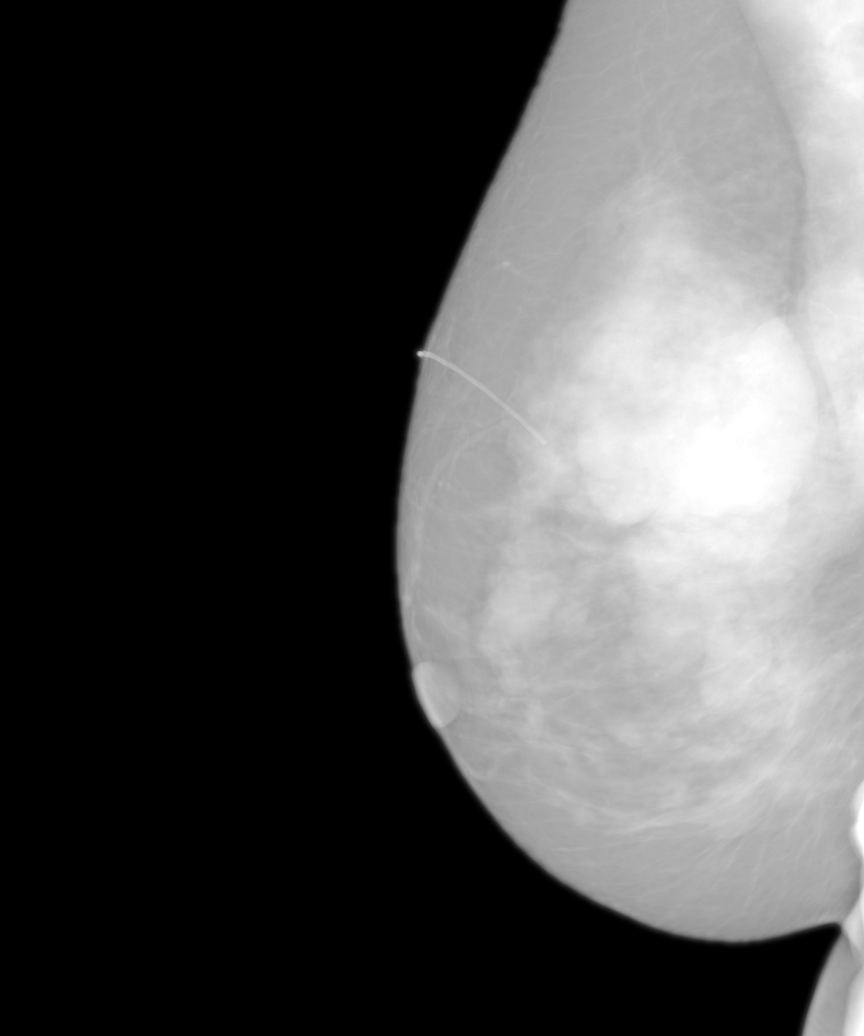
[im 6/6]
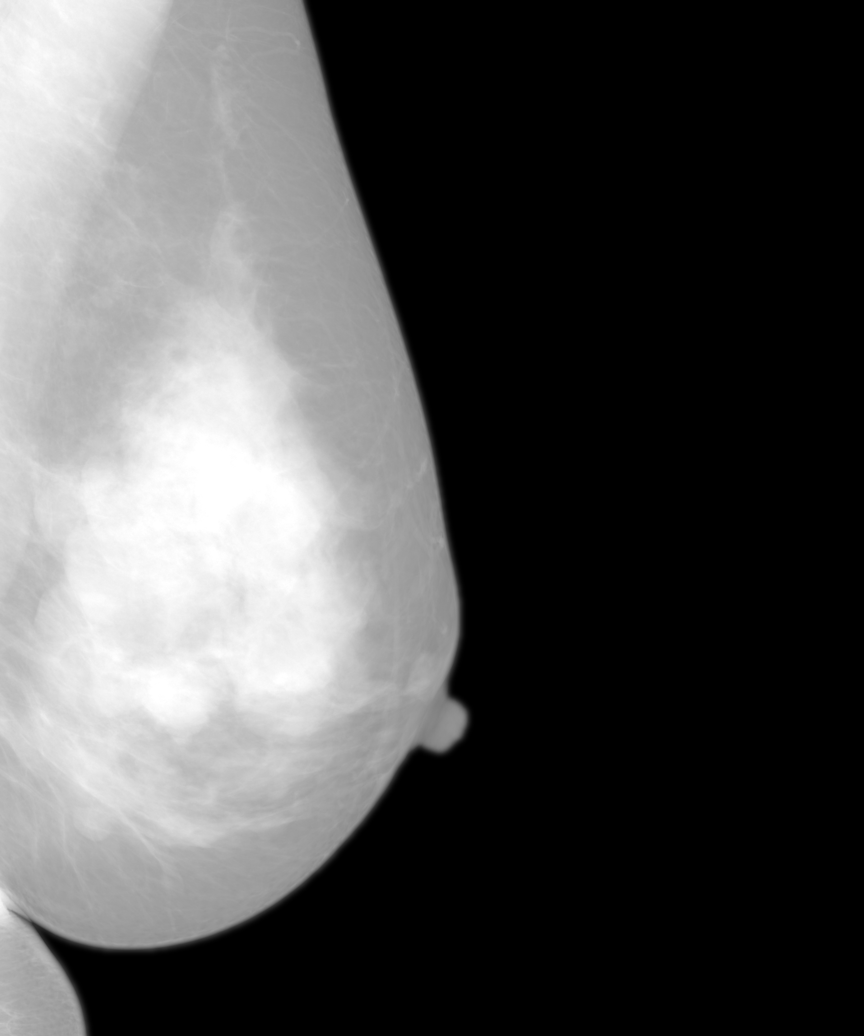

[6 of 6 positions shown; findings below may reference images not displayed]

PROCEDURE:     MAM - MAM DGTL DIAGNOSTIC MAMMO W/CAD  - [DATE]  [DATE]

RESULT:     Multiple bilateral breast masses are noted.  There is a new
breast mass in the medial portion of the LEFT breast.  The remainder of the
bilateral breast masses are stable.  The new breast mass measures
approximately 1.5 cm and is present in the medial portion of the LEFT breast
seen on craniocaudad view.  Ultrasound of this well-circumscribed mass is
suggested.  This most likely represents a cyst.
IMPRESSION: Multiple bilateral breast masses consistent with multiple bilateral cysts.

New breast mass in the medial portion of the LEFT breast seen on
craniocaudad view.  This is well circumscribed and most likely represents
cyst.  Ultrasound of this new well circumscribed nodular density is
suggested.

BI-RADS 0 - Needs Additional Imaging Evaluation.

A NEGATIVE MAMMOGRAM REPORT DOES NOT PRECLUDE BIOPSY OR OTHER EVALUATION OF
A CLNICALLY PALPABLE OR OTHERWISE SUSPICIOUS MASS OR LESION.  BREAST CANCER
MAY NOT BE DETECTED BY MAMMOGRAPHY IN UP TO 10% OF CASES.

## 2006-07-30 ENCOUNTER — Ambulatory Visit: Payer: Self-pay

## 2006-07-30 DIAGNOSIS — D239 Other benign neoplasm of skin, unspecified: Secondary | ICD-10-CM

## 2006-07-30 DIAGNOSIS — D229 Melanocytic nevi, unspecified: Secondary | ICD-10-CM

## 2006-07-30 HISTORY — DX: Melanocytic nevi, unspecified: D22.9

## 2006-07-30 HISTORY — DX: Other benign neoplasm of skin, unspecified: D23.9

## 2007-01-05 ENCOUNTER — Ambulatory Visit: Payer: Self-pay | Admitting: General Surgery

## 2007-01-05 IMAGING — MG MM CAD SCREENING MAMMO
1 series · 4 of 4 positions shown · non-contrast
Comparison: none

REASON FOR EXAM: FCD
COMMENTS:

[Series 2268: R CC · right · 4 of 4 slices shown]
[im 1/4]
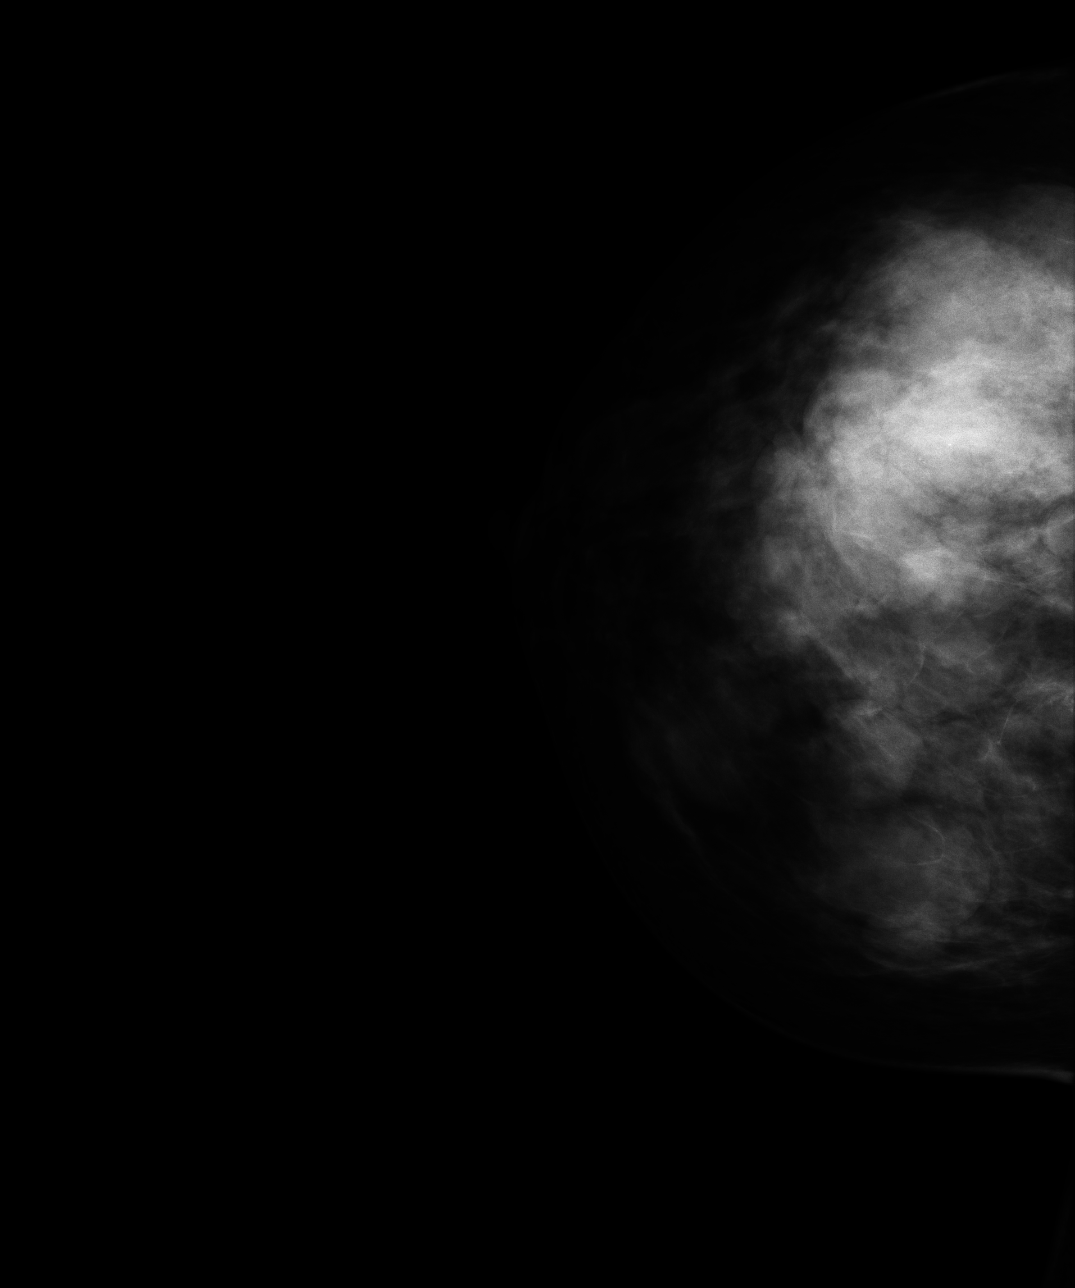
[im 2/4]
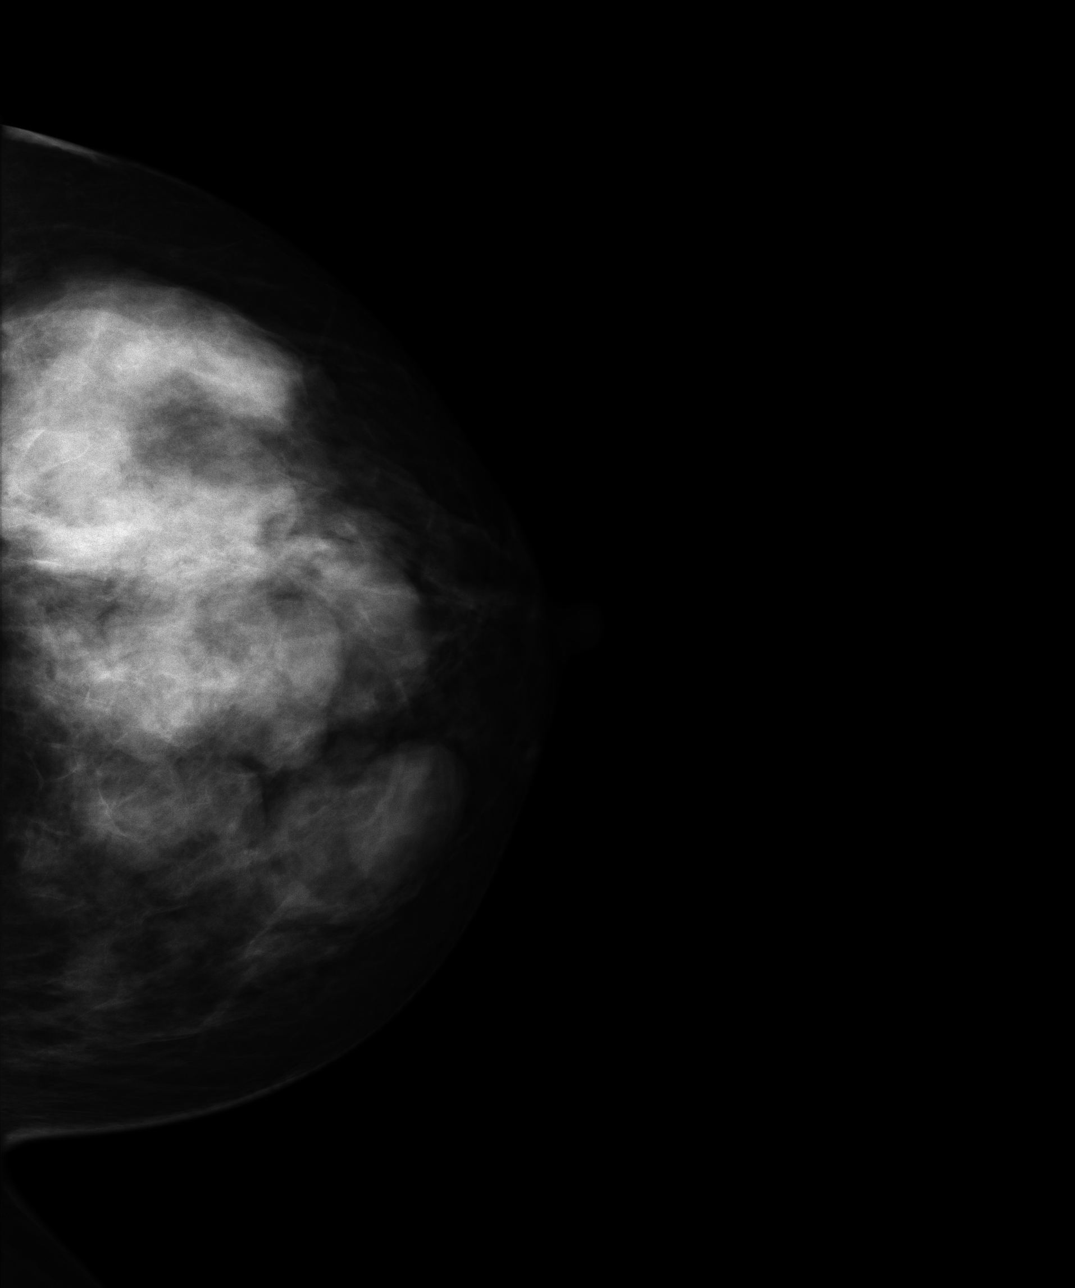
[im 3/4]
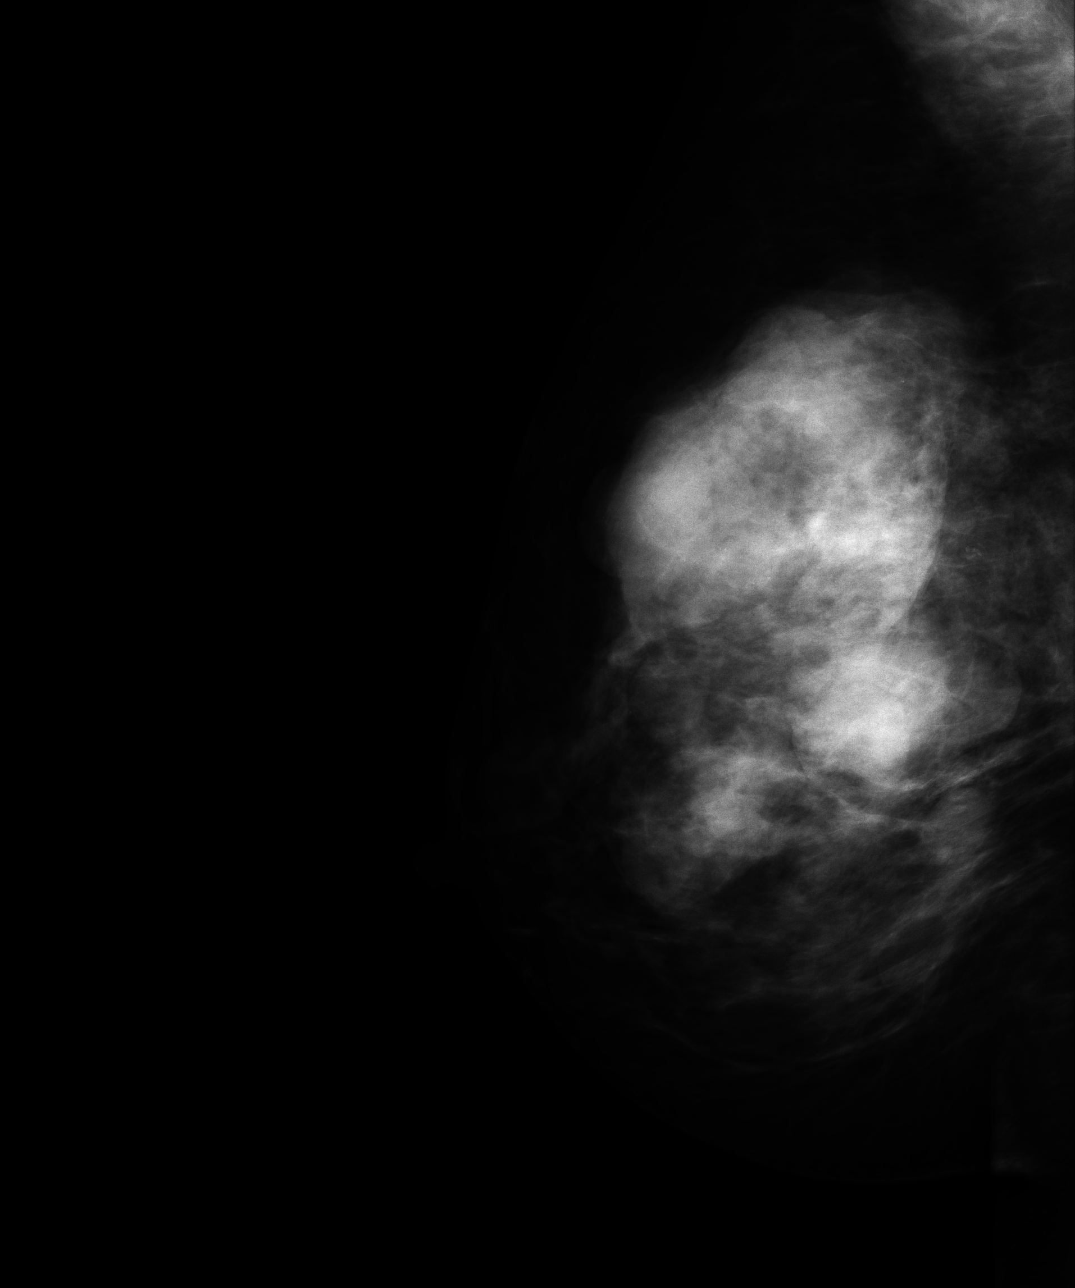
[im 4/4]
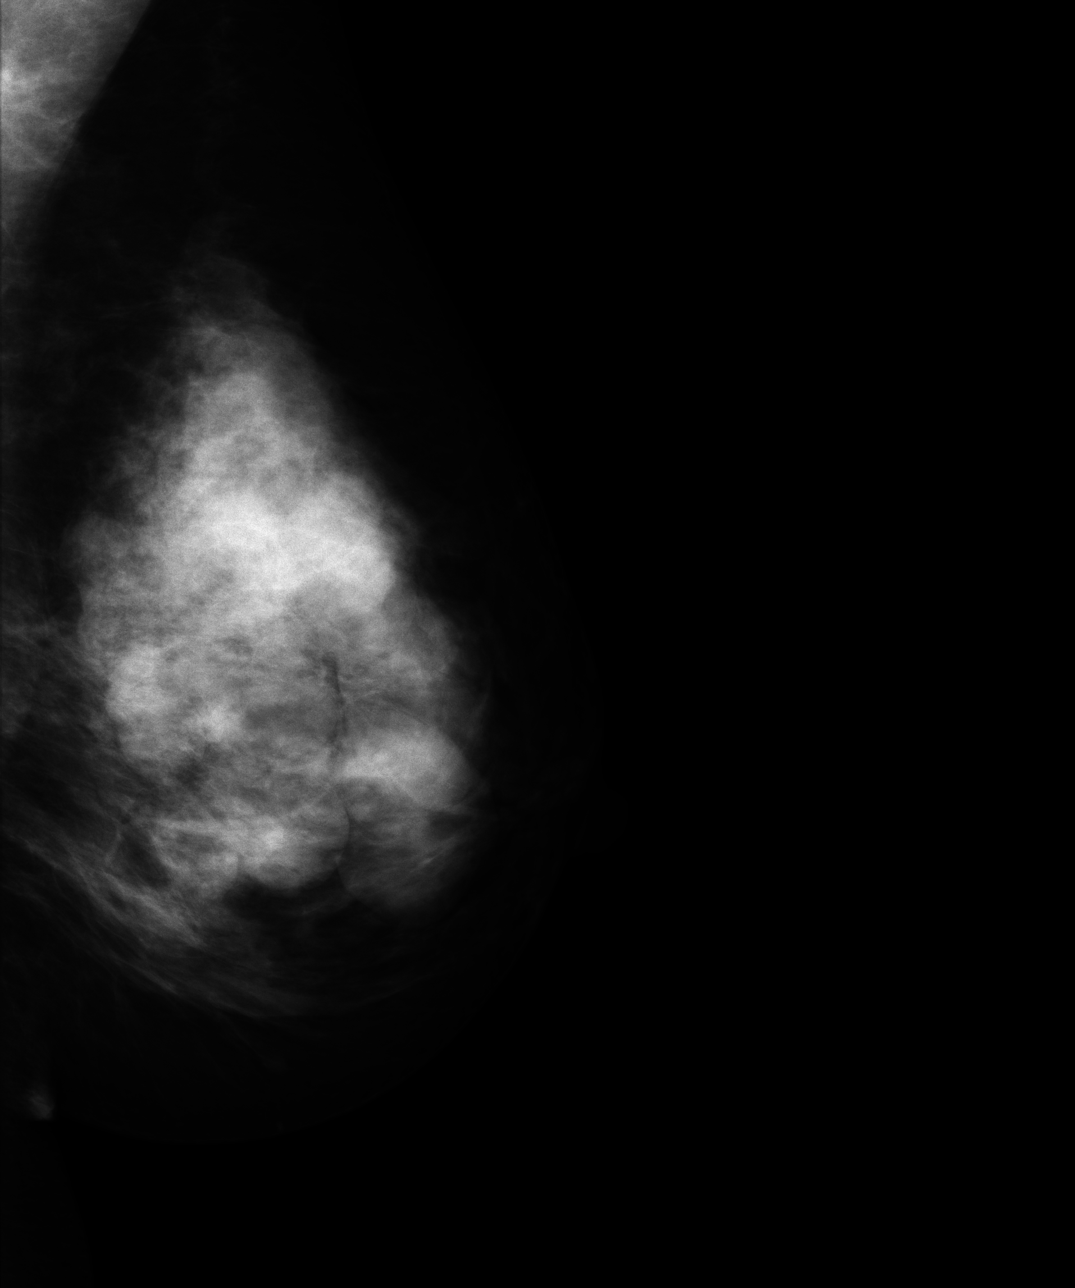

[4 of 4 positions shown; findings below may reference images not displayed]

PROCEDURE:     MAM - MAM DGTL SCREENING MAMMO W/CAD  - [DATE]  [DATE]

RESULT:     Comparison is made to film screening images of [DATE] as well as
to digital images of [DATE] and unilateral RIGHT breast images of [DATE]
and bilateral images of [DATE].  The breasts exhibit a dense parenchymal
pattern with diffuse areas of nodularity present in both breasts.  There
appears to be stable smoothly marginated density medially in the LEFT breast
as well as in the RIGHT breast.  There is no developing density or dominant
mass. There is no architectural distortion or malignant appearing
calcification.
IMPRESSION: 1)Stable, benign appearing bilateral mammogram.

BI-RADS: Category 2-Benign Finding (s)

RECOMMENDATIONS

1)Please continue to encourage annual mammographic follow-up.

A NEGATIVE MAMMOGRAM REPORT DOES NOT PRECLUDE BIOPSY OR OTHER EVALUATION OF
A CLINICALLY PALPABLE OR OTHERWISE SUSPICIOUS MASS OR LESION. BREAST CANCER
MAY NOT BE DETECTED BY MAMMOGRAPHY IN UP TO 10% OF CASES.

## 2007-06-27 ENCOUNTER — Encounter: Payer: Self-pay | Admitting: Orthopaedic Surgery

## 2007-07-25 ENCOUNTER — Encounter: Payer: Self-pay | Admitting: Orthopaedic Surgery

## 2007-08-24 ENCOUNTER — Encounter: Payer: Self-pay | Admitting: Orthopaedic Surgery

## 2008-01-24 ENCOUNTER — Ambulatory Visit: Payer: Self-pay | Admitting: General Surgery

## 2008-01-24 IMAGING — MG MM CAD SCREENING MAMMO
1 series · 4 of 4 positions shown · non-contrast
Comparison: none

REASON FOR EXAM: Screening mammogram  CAT 2
COMMENTS:

[R CC · right · 4 of 4 slices shown]
[im 1/4]
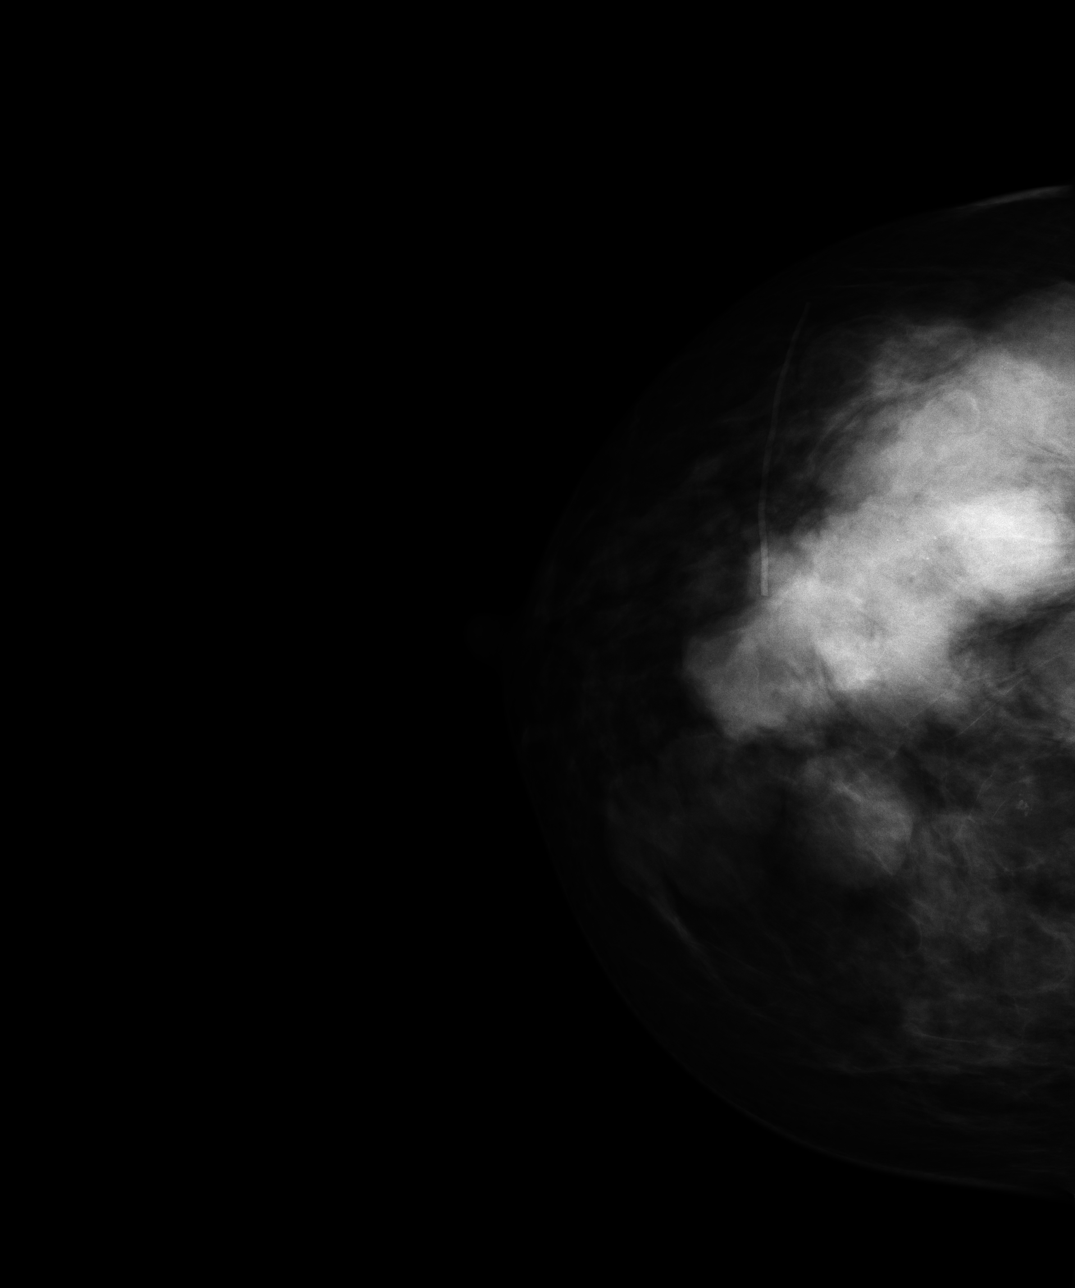
[im 2/4]
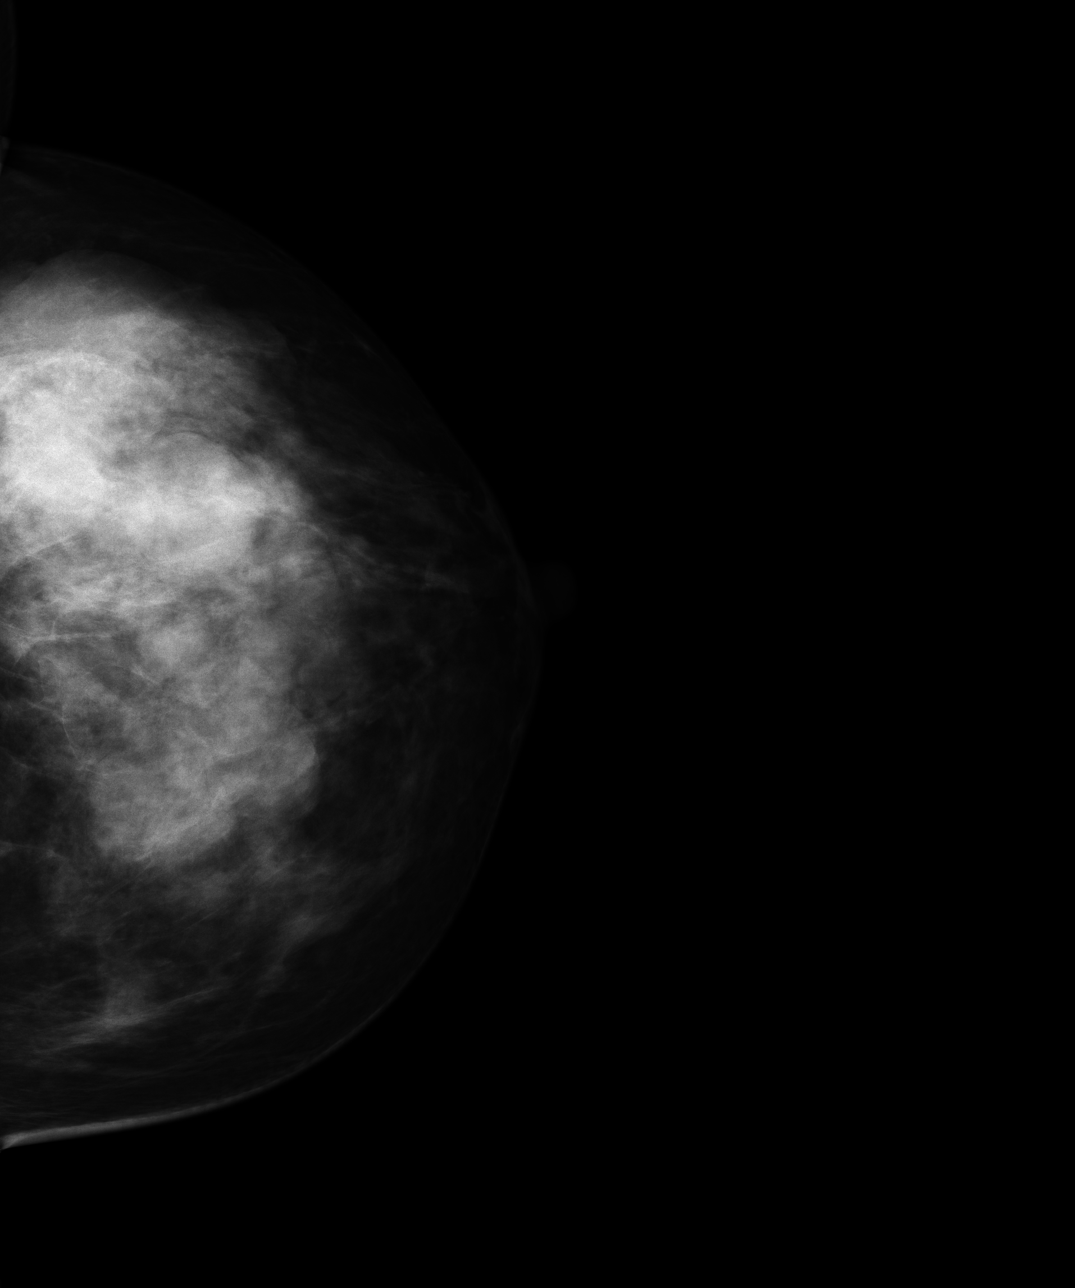
[im 3/4]
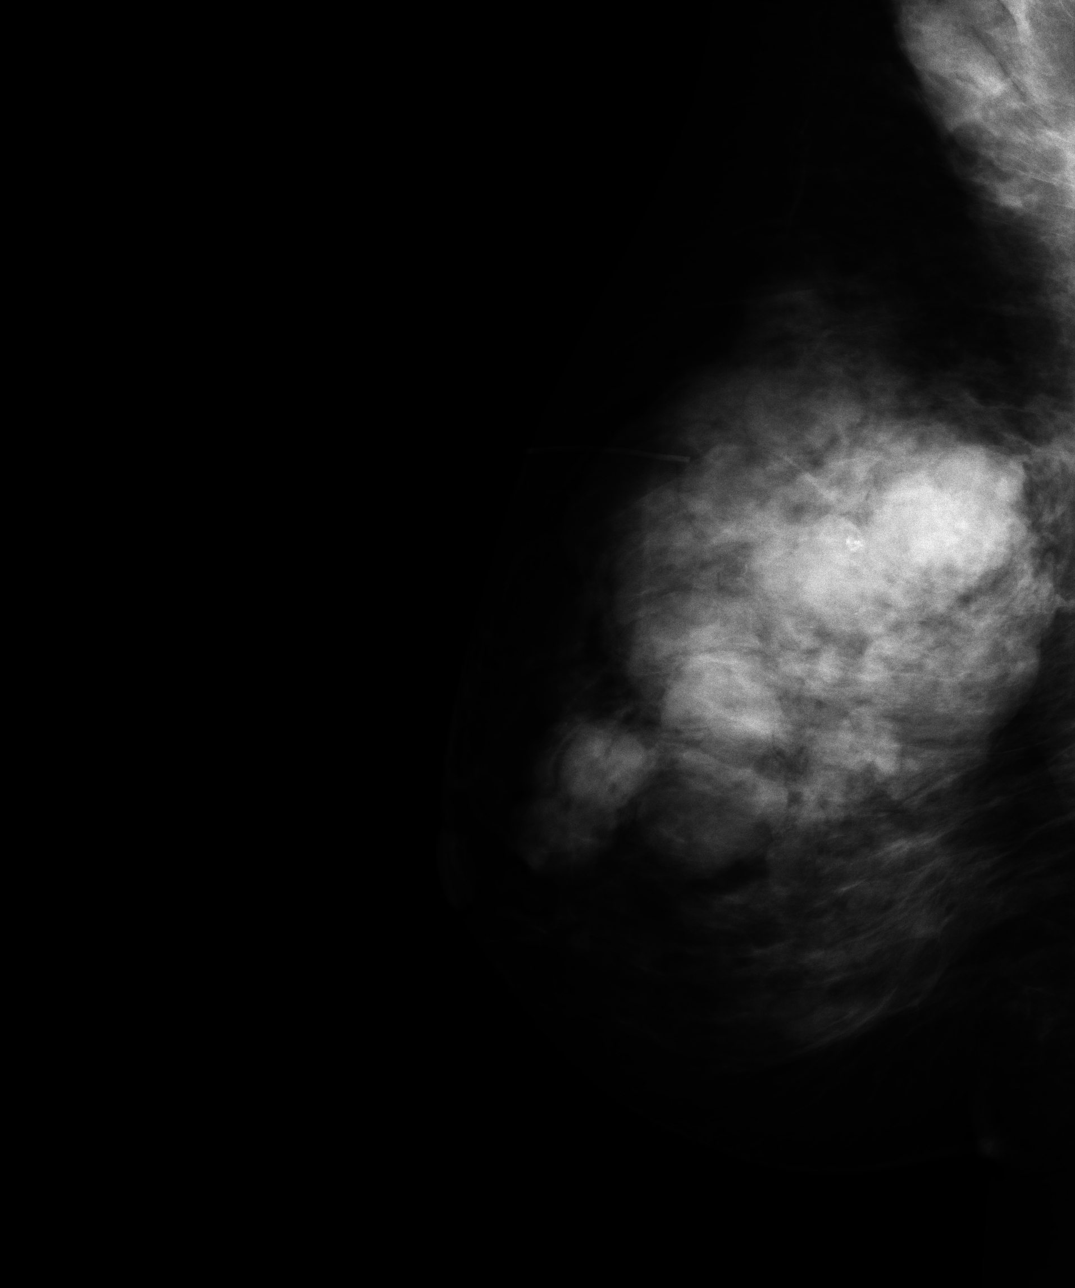
[im 4/4]
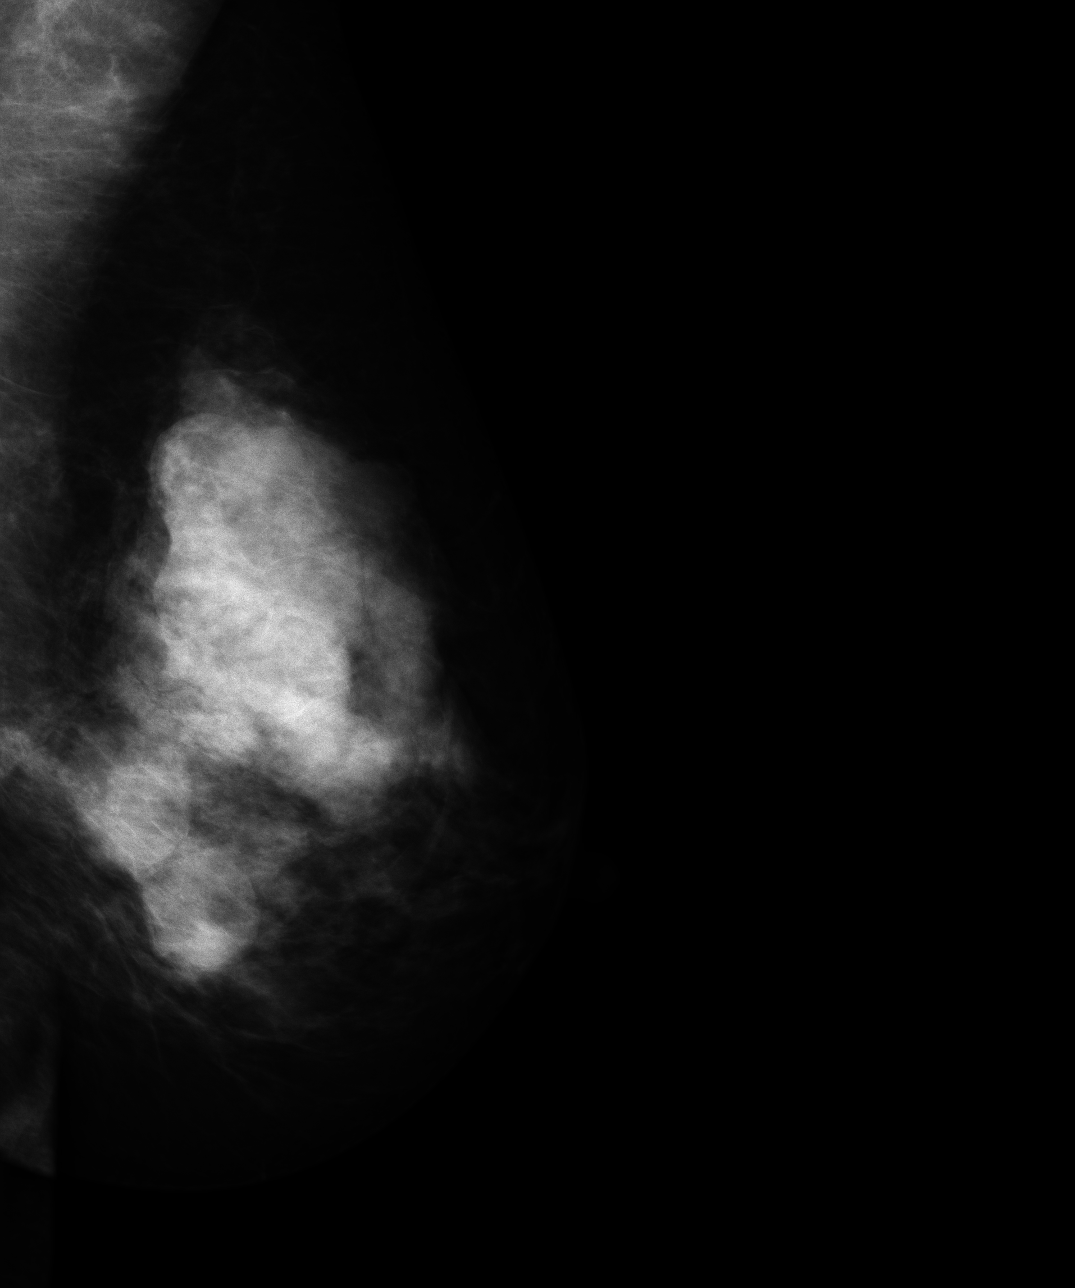

[4 of 4 positions shown; findings below may reference images not displayed]

PROCEDURE:     MAM - MAM DGTL SCREENING MAMMO W/CAD  - [DATE]  [DATE]

RESULT:     Comparison is made to prior examinations of [DATE],
[DATE] and [DATE].

Multiple, smoothly marginated breast masses are again seen and are most
compatible with numerous cysts secondary to fibrocystic change. No focal
dominant mass suspicious for malignancy is seen. The breast parenchyma is
dense. There are a few, scattered, benign appearing calcifications on the
RIGHT.
IMPRESSION: 1.     Stable, bilaterally benign appearing screening mammography.
2.     Multiple masses consistent with fibrocystic change are again observed.
3.     Continued annual screening mammography is recommended.
4.     BI-RADS: Category 2 - Benign Finding

## 2009-02-07 ENCOUNTER — Ambulatory Visit: Payer: Self-pay | Admitting: General Practice

## 2009-02-07 IMAGING — CT CT STONE STUDY
1 of 2 series · 16 of 32 positions shown, 20 images · non-contrast
Comparison: none

REASON FOR EXAM: flank pain hematuria eval for kideny stone
COMMENTS:

[Series 2: stone · axial · 0.66mm/px · z∈[-416,-26]mm · 16 of 142 slices shown, 20 images]
[im 6/142  soft-tissue]
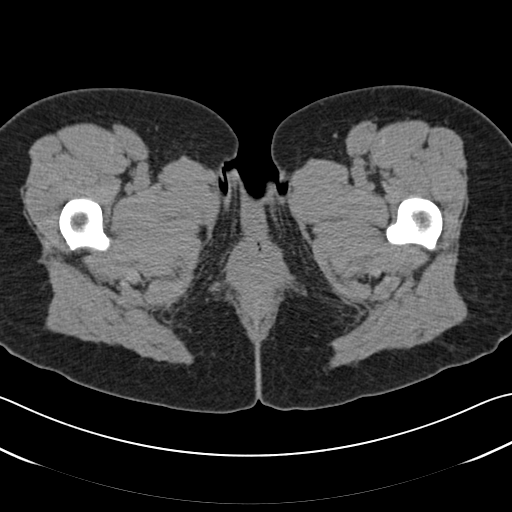
[im 6/142  bone]
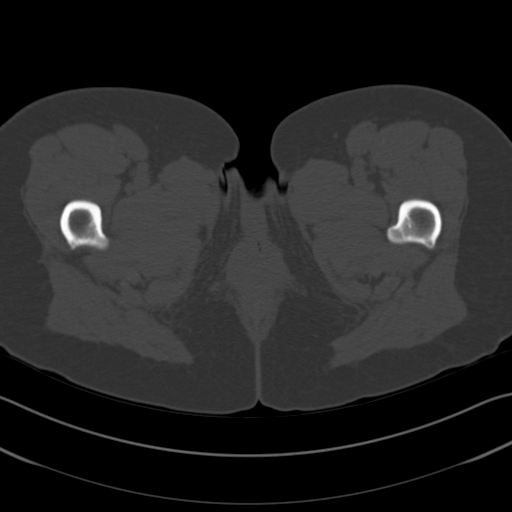
[im 16/142  soft-tissue]
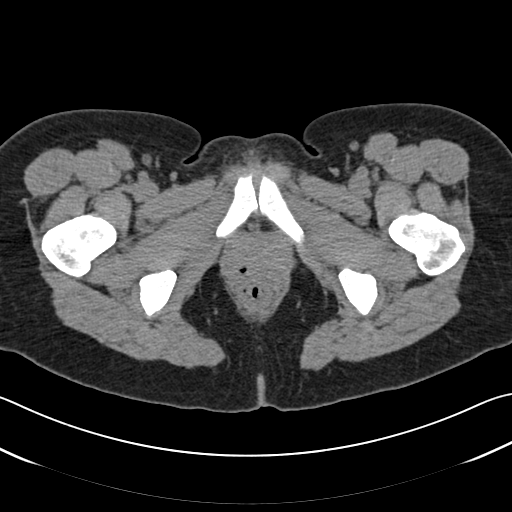
[im 27/142  soft-tissue]
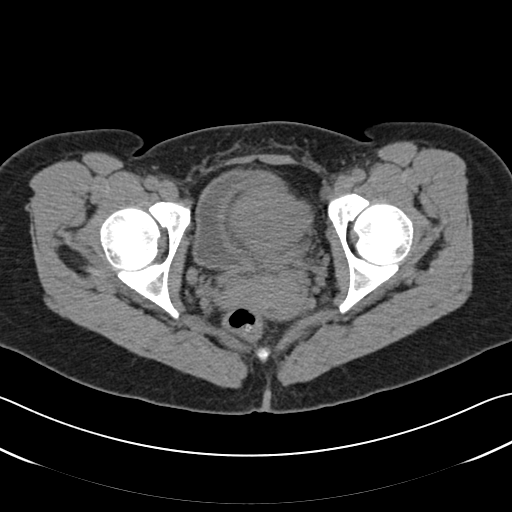
[im 37/142  soft-tissue]
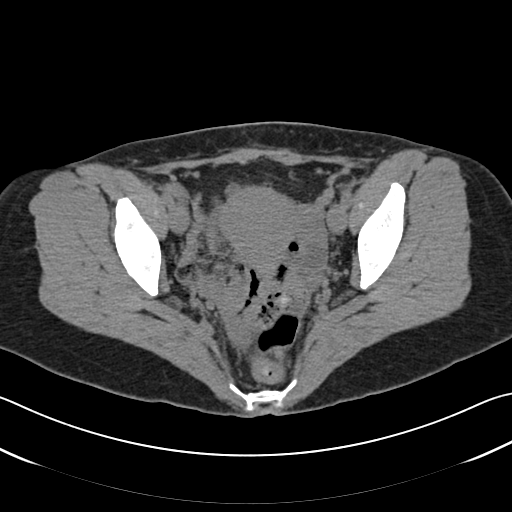
[im 48/142  soft-tissue]
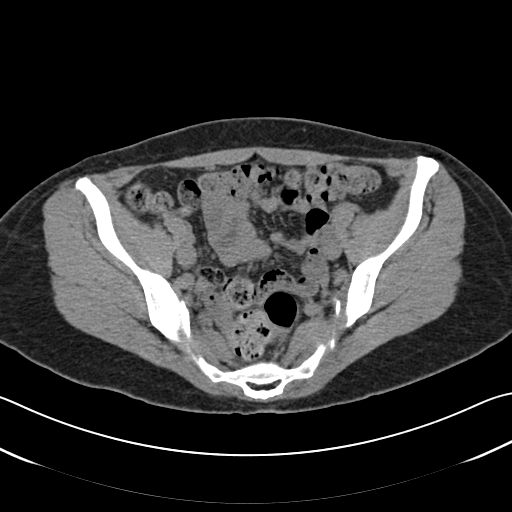
[im 58/142  soft-tissue]
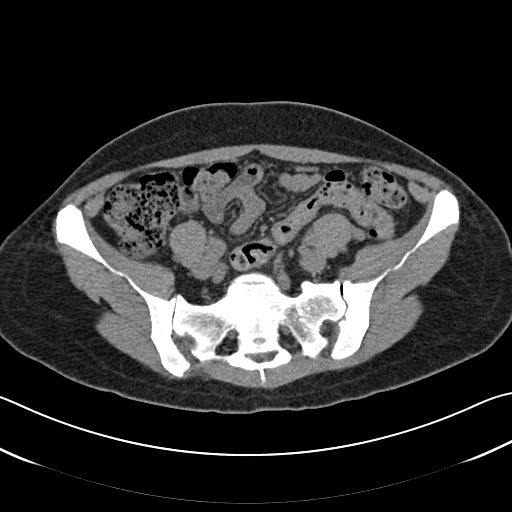
[im 68/142  soft-tissue]
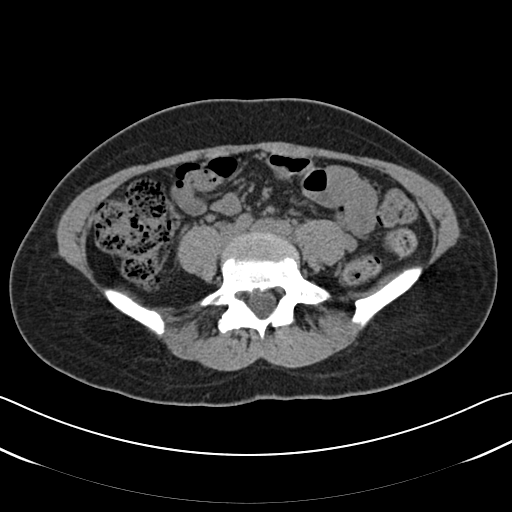
[im 74/142  soft-tissue]
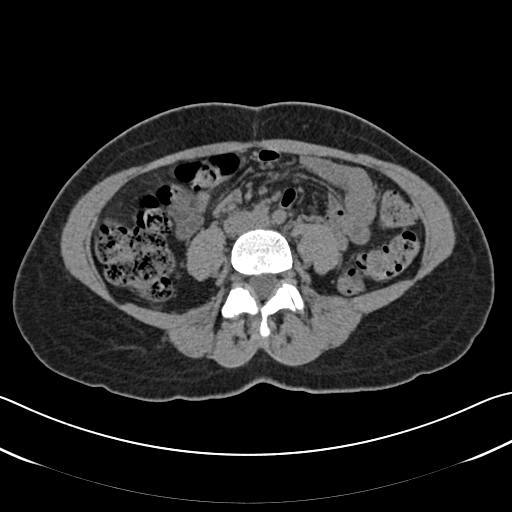
[im 84/142  soft-tissue]
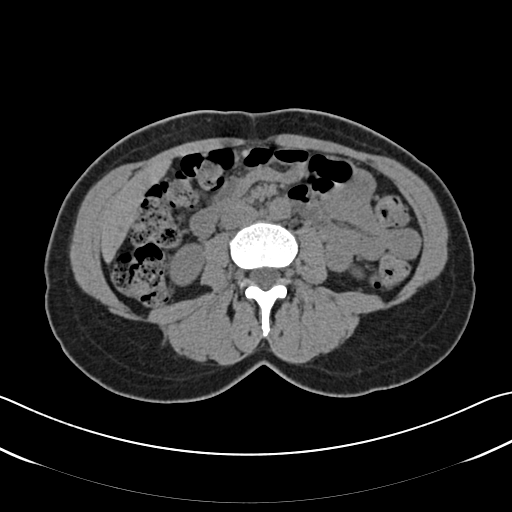
[im 84/142  bone]
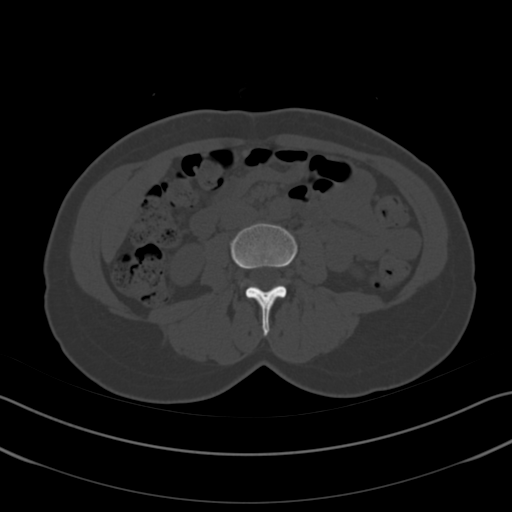
[im 95/142  soft-tissue]
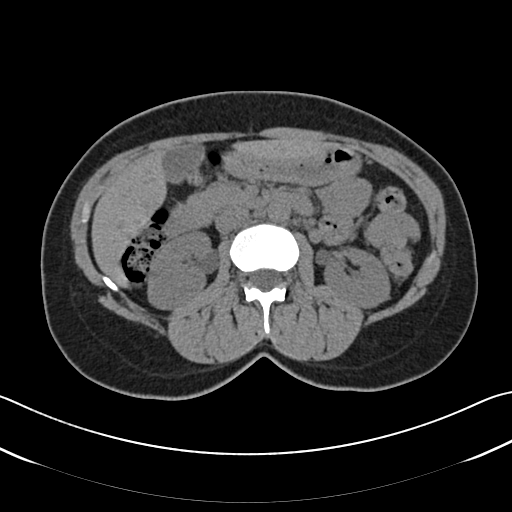
[im 105/142  soft-tissue]
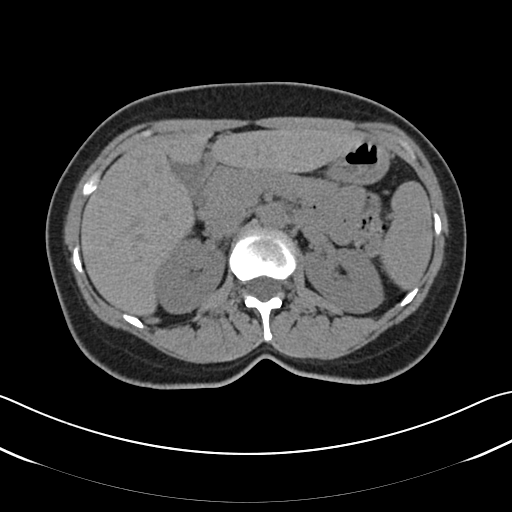
[im 115/142  soft-tissue]
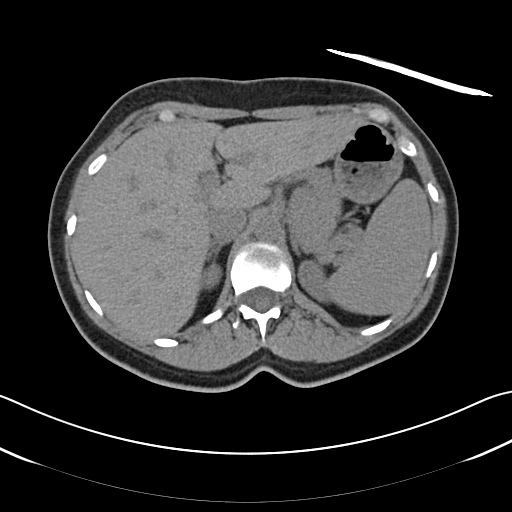
[im 121/142  lung]
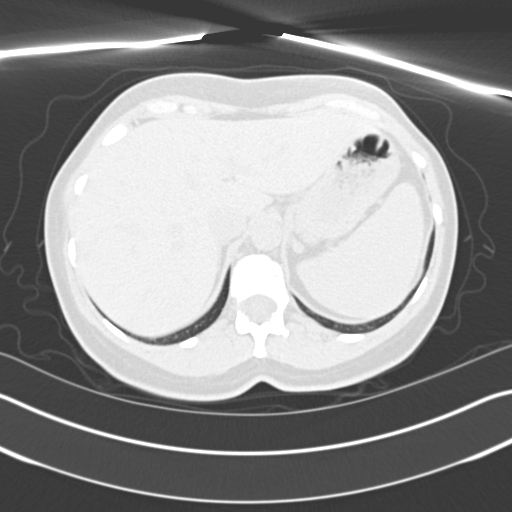
[im 126/142  soft-tissue]
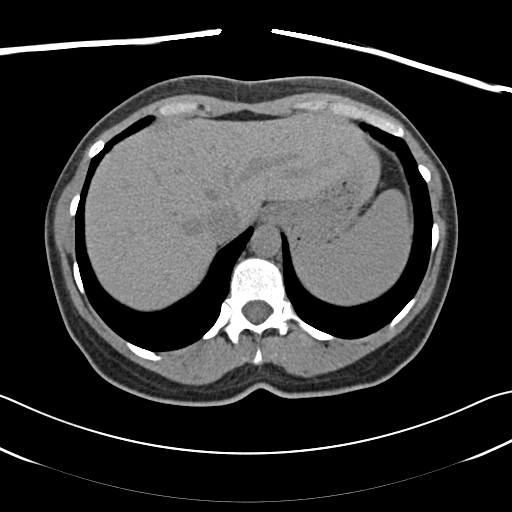
[im 126/142  lung]
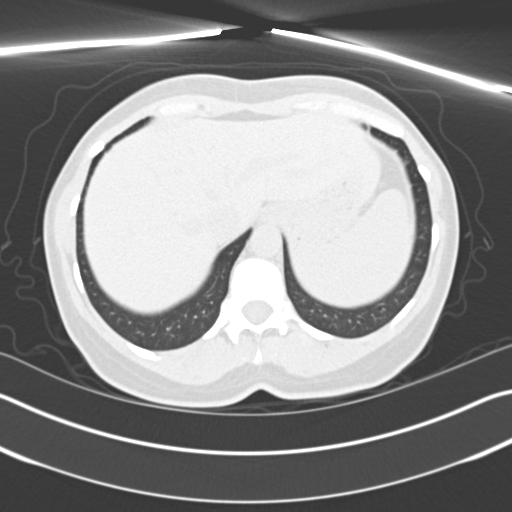
[im 131/142  lung]
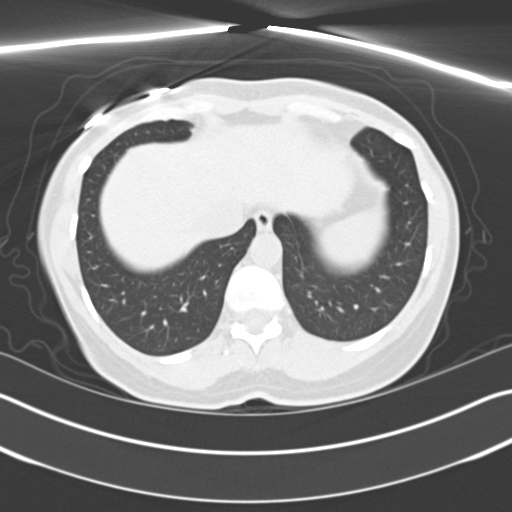
[im 136/142  soft-tissue]
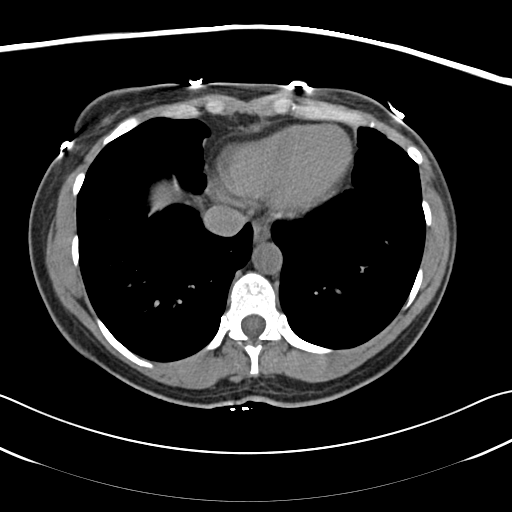
[im 136/142  lung]
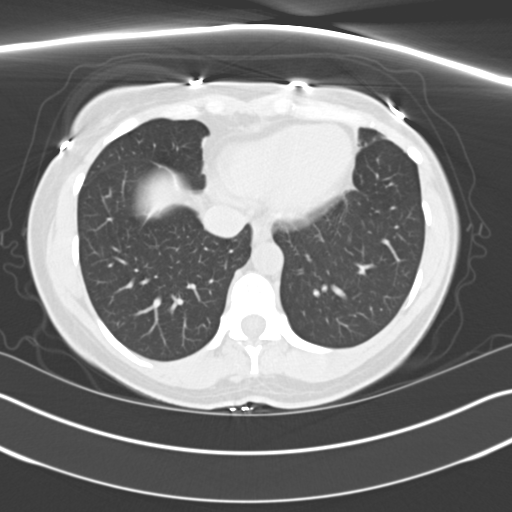

[16 of 32 positions shown; findings below may reference images not displayed]

PROCEDURE:     CT  - CT ABDOMEN /PELVIS WO (STONE)  - [DATE] [DATE]

RESULT:     Non-contrast CT of the abdomen and pelvis is reconstructed at 3
mm slice thickness in the axial plane. There is no previous exam for
comparison.

Images through the base of the lungs demonstrate normal aeration. The
abdominal viscera appear to be normal for a noncontrast exam. The uterus is
present. The urinary bladder is not significantly distended. There is some
free fluid in the right pelvis which may be physiologic. No acute
inflammation is seen.
IMPRESSION: 1.     No urinary tract stones or obstructive changes are seen. There is a
moderate amount of stool in the colon without obstruction. There is no
evidence of appendicitis. There is a trace amount of free fluid in the
pelvis which may be physiologic.

## 2009-02-19 ENCOUNTER — Encounter: Payer: Self-pay | Admitting: Orthopedic Surgery

## 2009-02-23 ENCOUNTER — Encounter: Payer: Self-pay | Admitting: Orthopedic Surgery

## 2009-02-26 ENCOUNTER — Ambulatory Visit: Payer: Self-pay | Admitting: General Surgery

## 2009-02-26 IMAGING — MG MM CAD SCREENING MAMMO
1 series · 4 of 4 positions shown · non-contrast
Comparison: none

REASON FOR EXAM: SCREENING MAMMO [MN]
COMMENTS:  Submitted by practice: [HOSPITAL] Scheduled by
user:

[Series 3339: R CC · right · 4 of 4 slices shown]
[im 1/4]
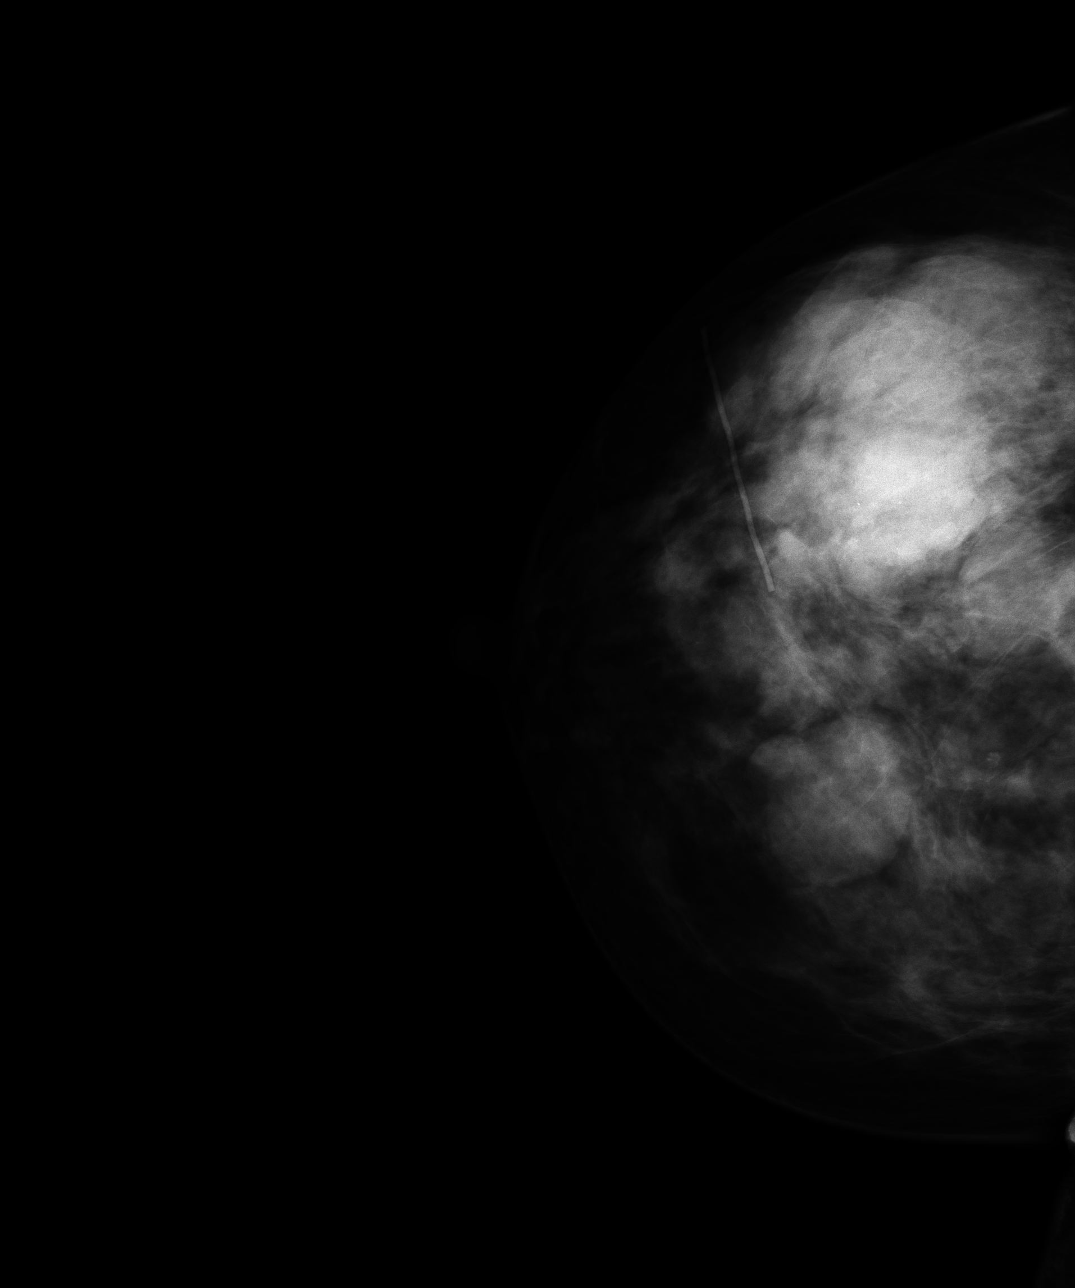
[im 2/4]
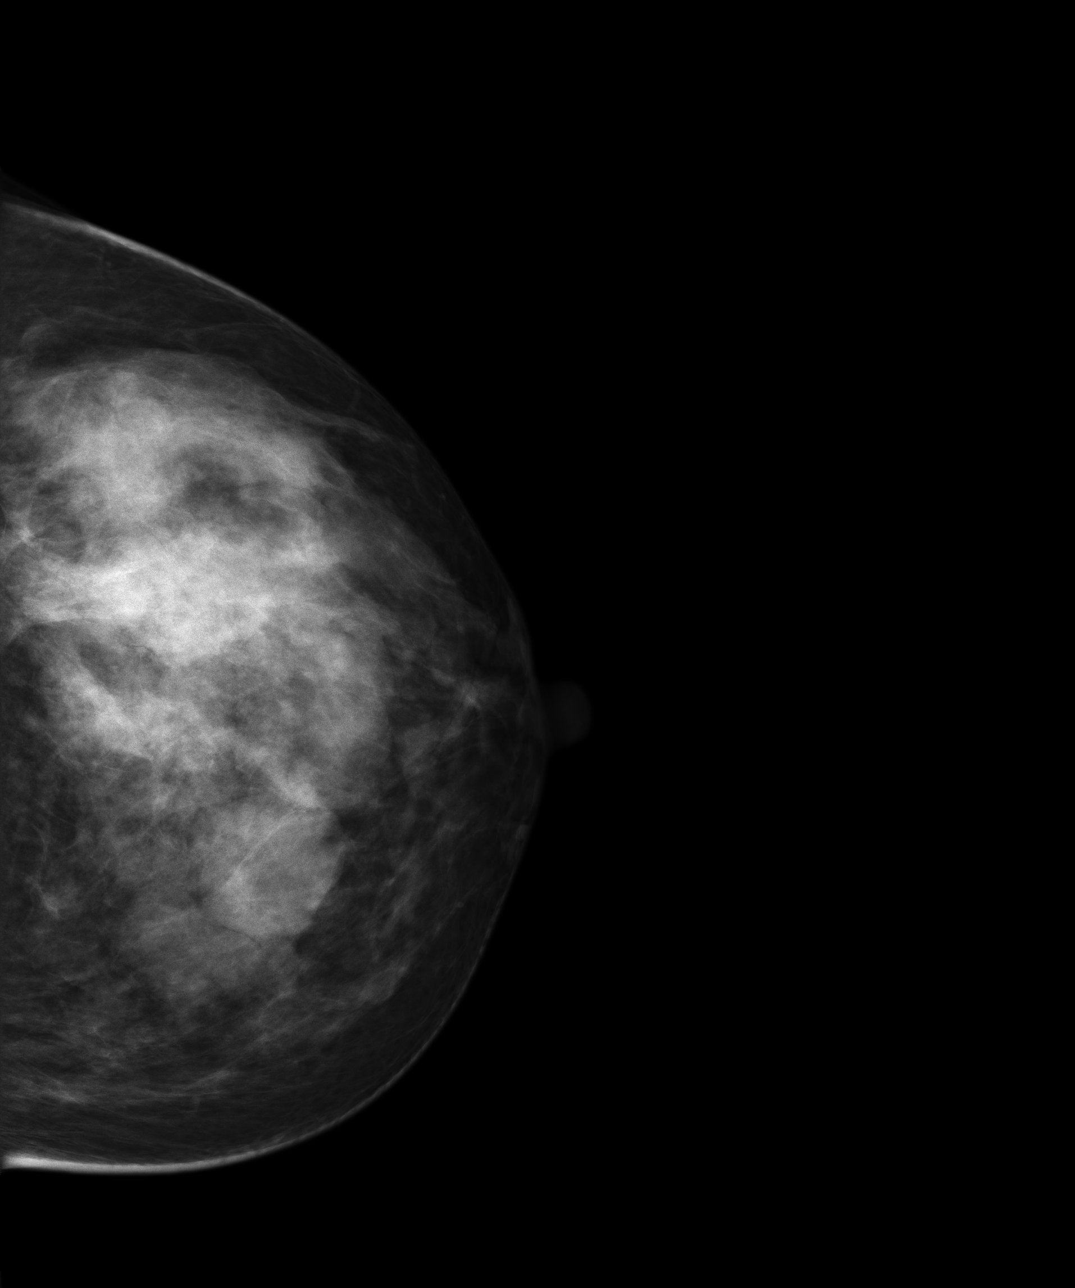
[im 3/4]
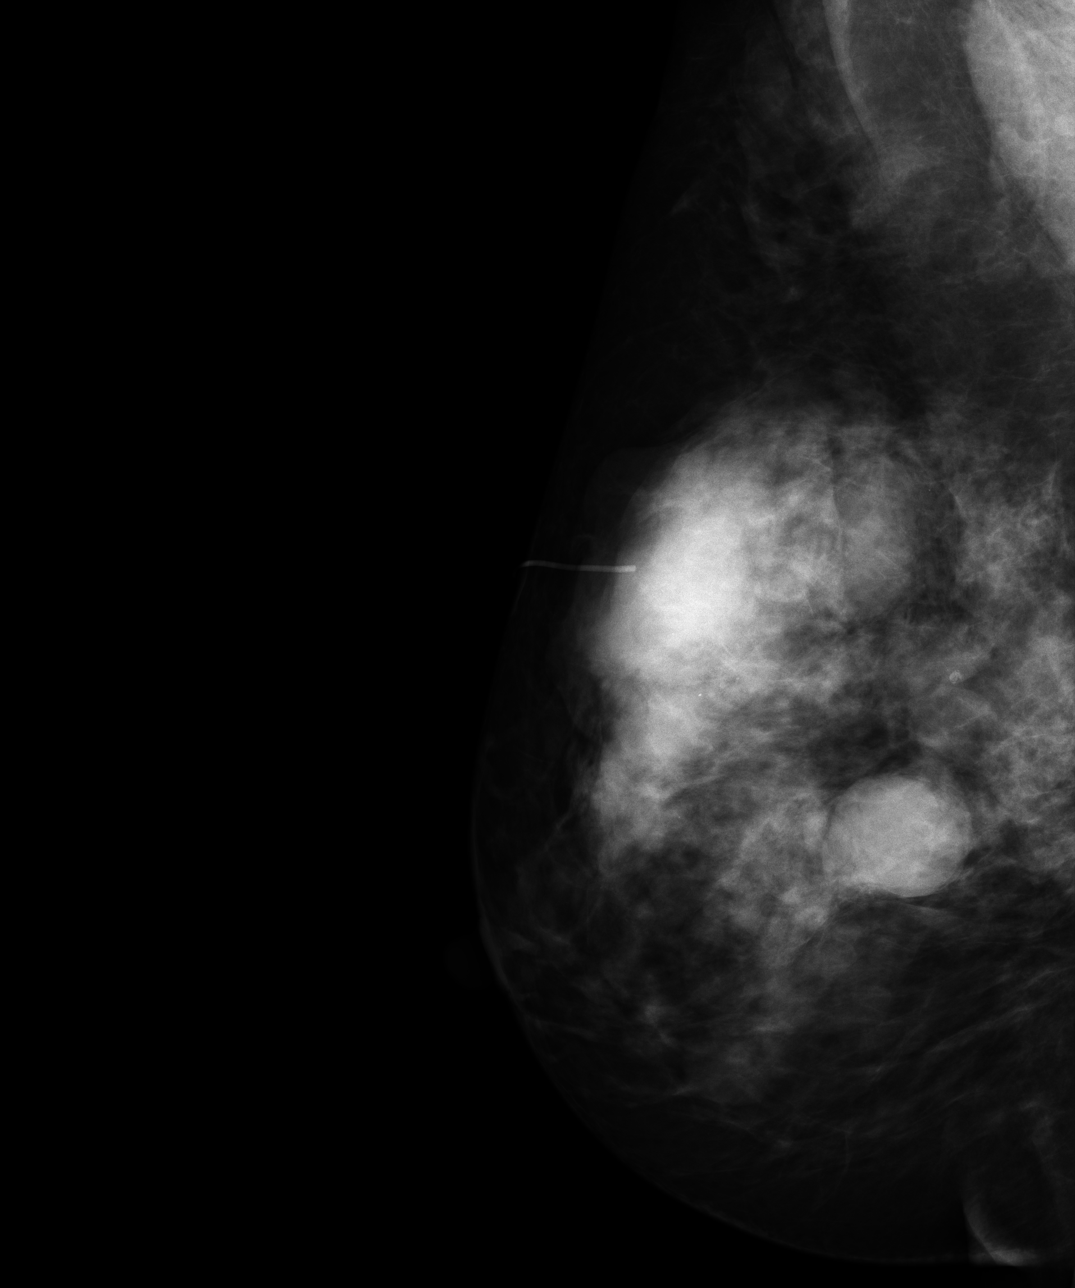
[im 4/4]
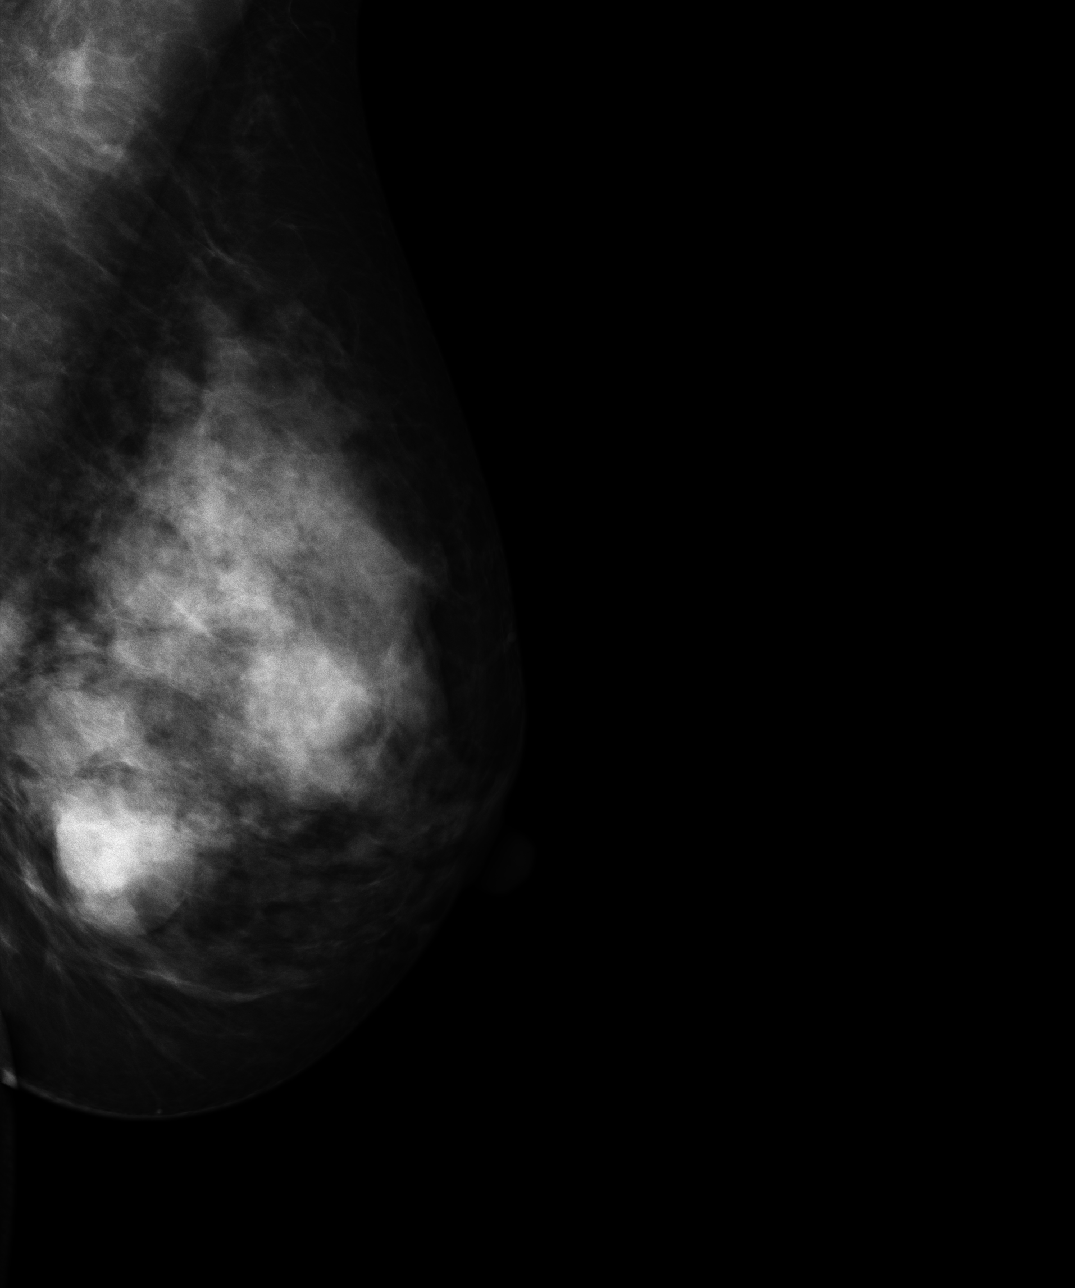

[4 of 4 positions shown; findings below may reference images not displayed]

PROCEDURE:     MAM - MAM DGTL SCREENING MAMMO W/CAD  - [DATE]  [DATE]

RESULT:       Comparison is made to prior studies dated [DATE], [DATE],
[DATE].

The breasts demonstrate a heterogenous fibronodular parenchymal pattern.
Multiple nodular densities are scattered throughout both breasts likely
representing the sequela of fibrocystic change.  There is no new
radiographic evidence to suggest malignancy.
IMPRESSION: BI-RADS:  Category 2- Benign Finding.

A negative mammogram report does not preclude biopsy or other evaluation of
a clinically palpable or otherwise suspicious mass or lesion. Breast cancer
may not be detected by mammography in up to 10% of cases.

## 2009-03-26 ENCOUNTER — Encounter: Payer: Self-pay | Admitting: Orthopedic Surgery

## 2009-04-23 ENCOUNTER — Encounter: Payer: Self-pay | Admitting: Orthopedic Surgery

## 2010-02-23 DIAGNOSIS — N6019 Diffuse cystic mastopathy of unspecified breast: Secondary | ICD-10-CM

## 2010-02-23 DIAGNOSIS — S0300XA Dislocation of jaw, unspecified side, initial encounter: Secondary | ICD-10-CM

## 2010-02-23 HISTORY — DX: Dislocation of jaw, unspecified side, initial encounter: S03.00XA

## 2010-02-23 HISTORY — DX: Diffuse cystic mastopathy of unspecified breast: N60.19

## 2010-03-04 ENCOUNTER — Ambulatory Visit: Payer: Self-pay | Admitting: General Surgery

## 2010-03-04 IMAGING — MG MM CAD SCREENING MAMMO
1 series · 4 of 4 positions shown · non-contrast
Comparison: none

REASON FOR EXAM: scr
COMMENTS:

[R CC · right · 4 of 4 slices shown]
[im 1/4]
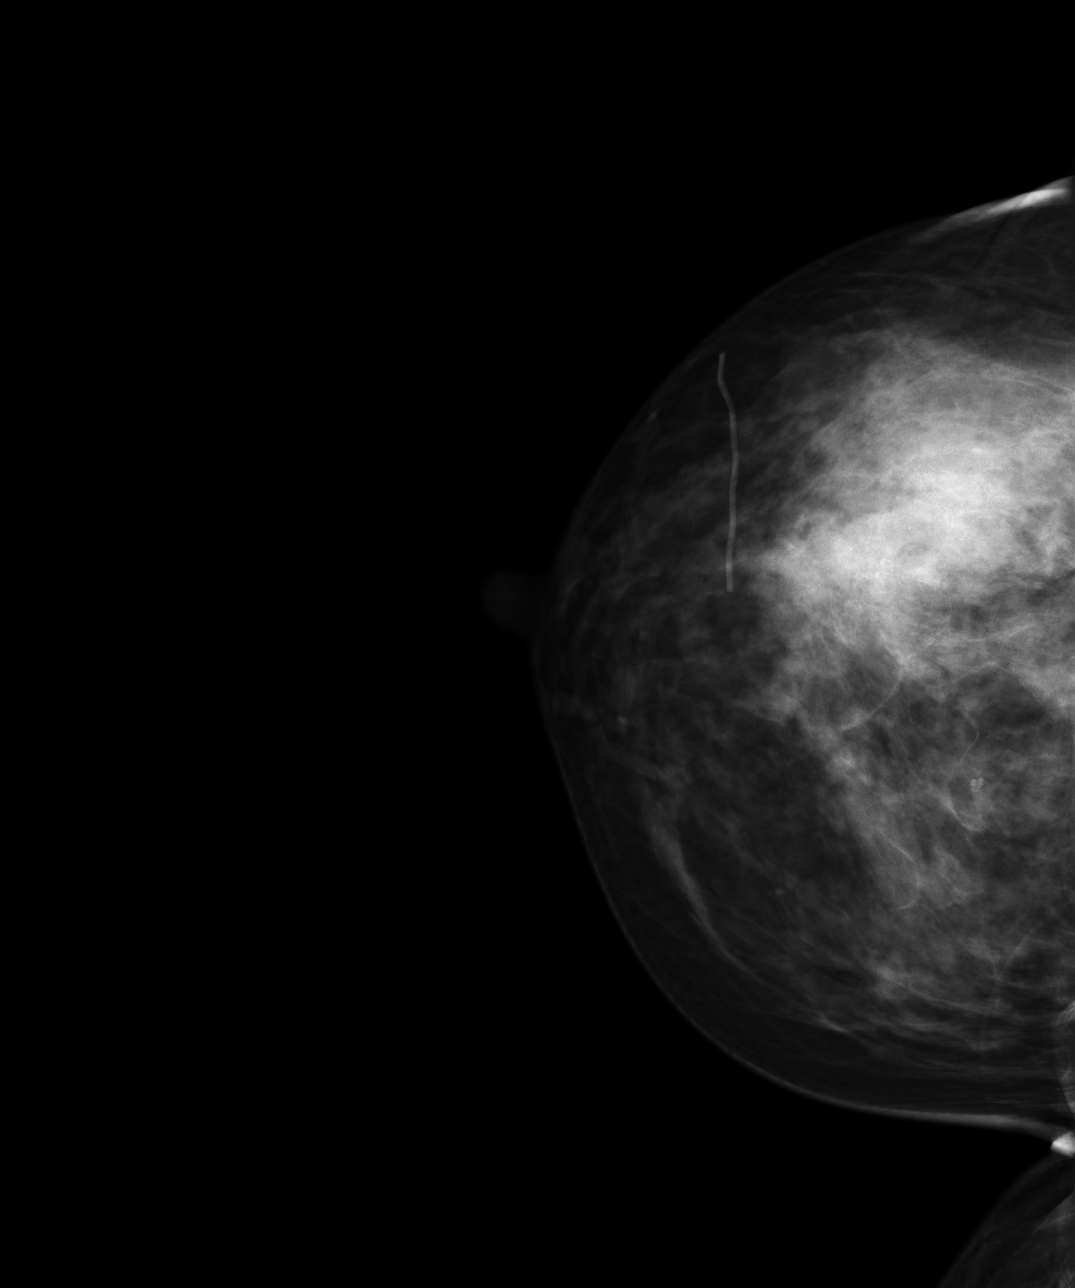
[im 2/4]
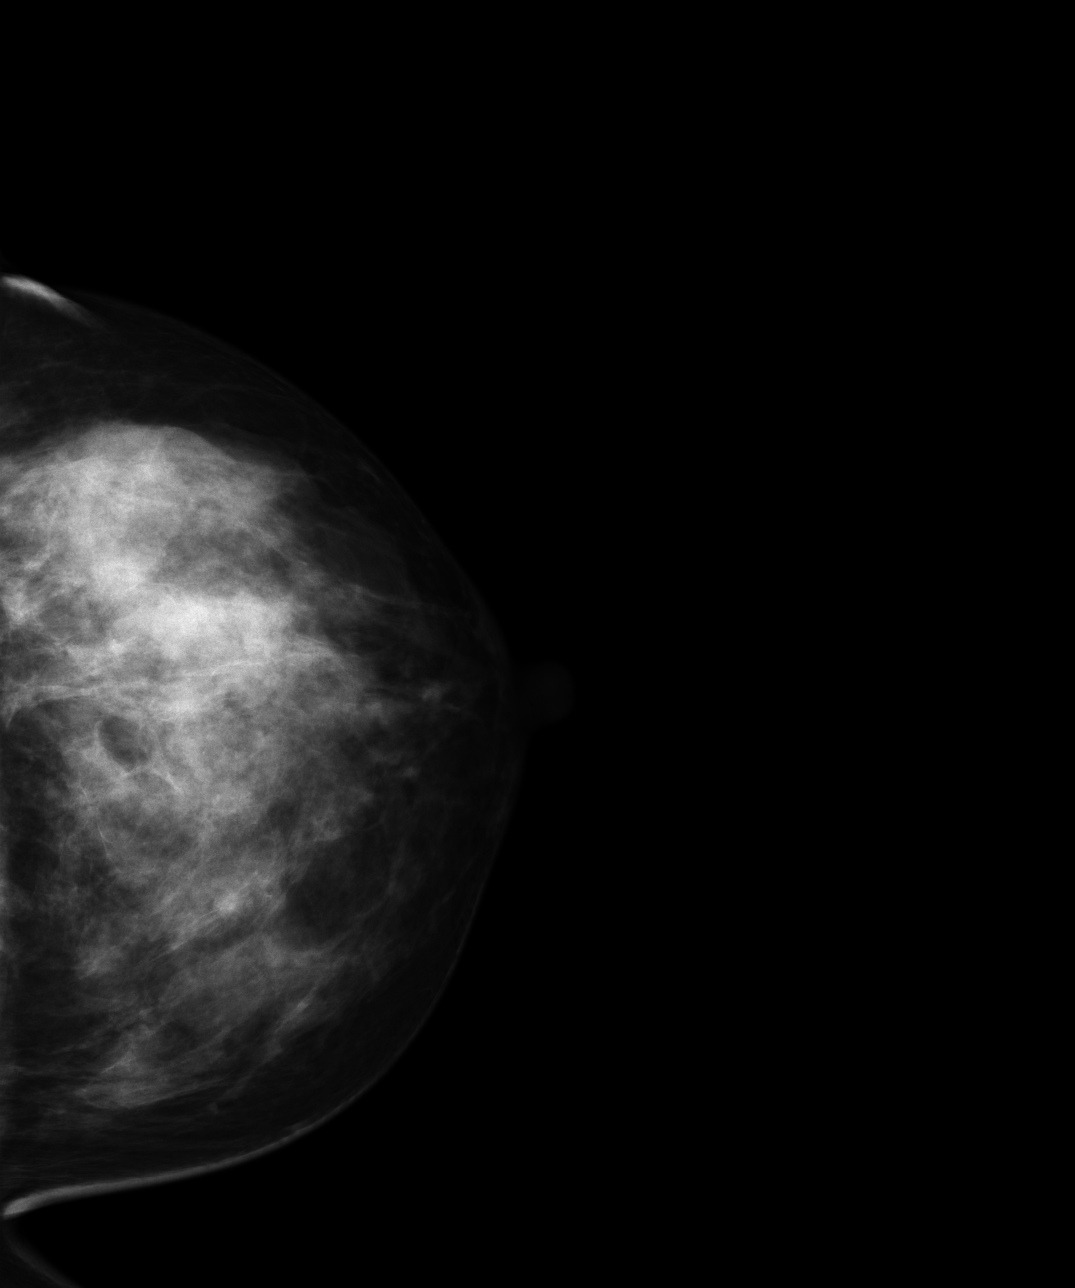
[im 3/4]
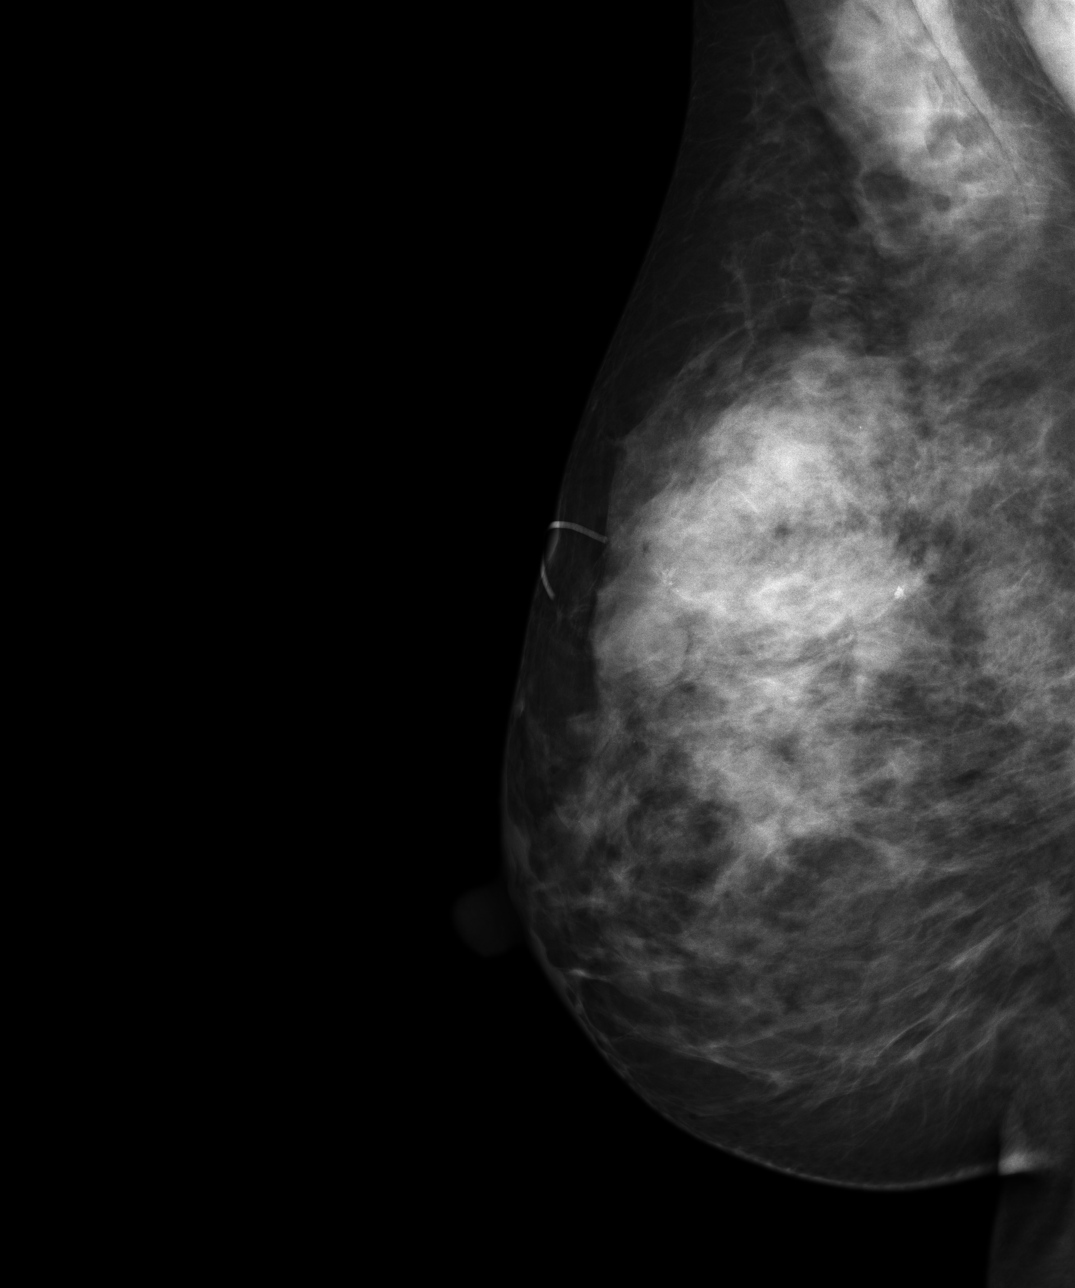
[im 4/4]
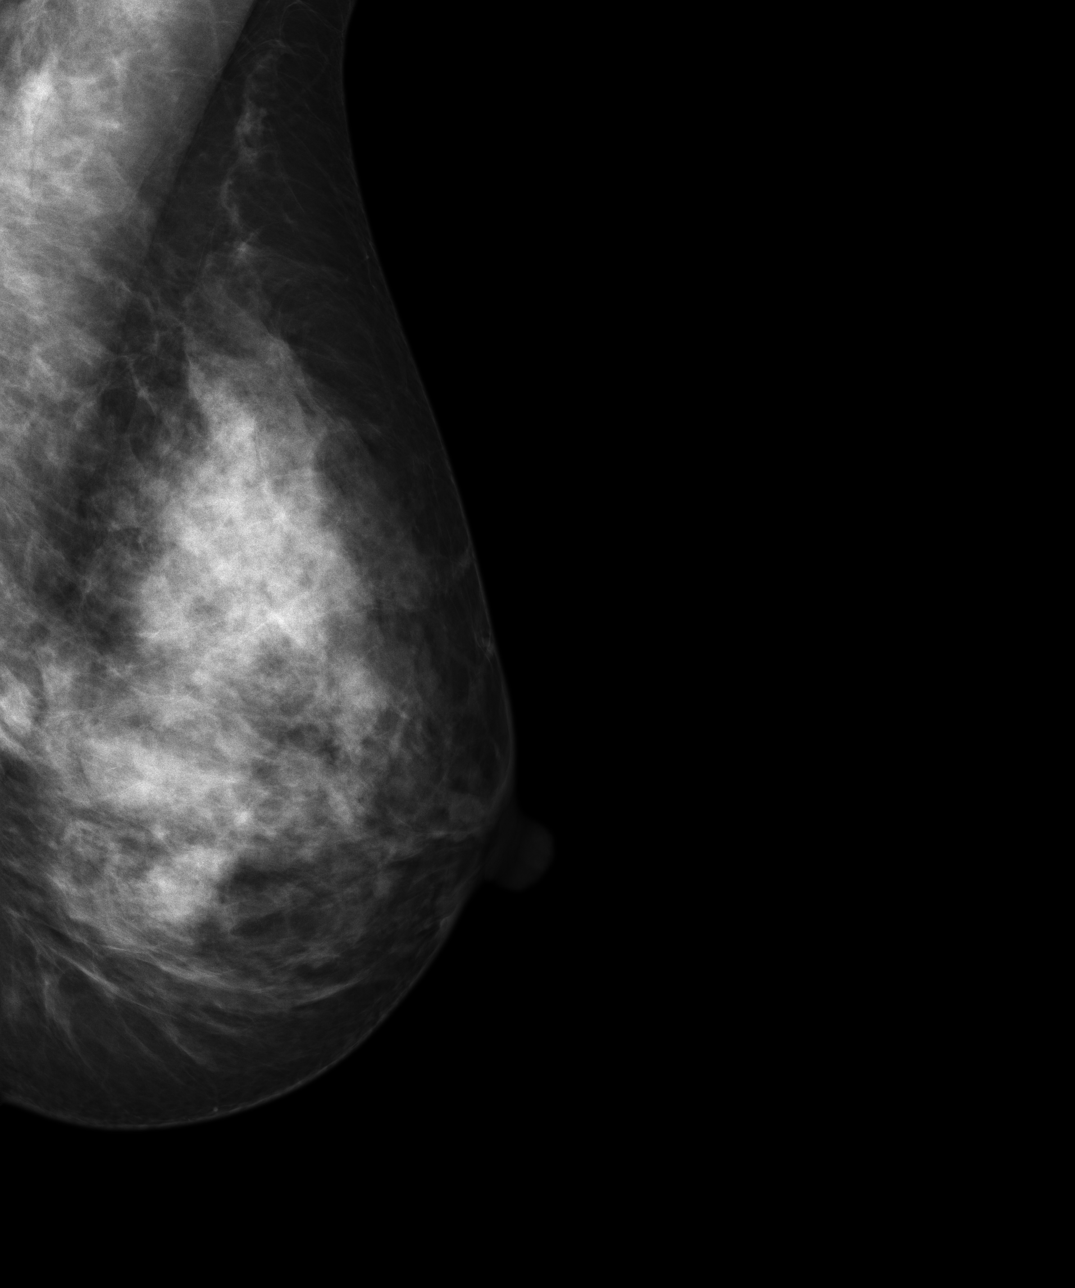

[4 of 4 positions shown; findings below may reference images not displayed]

PROCEDURE:     MAM - MAM DGTL SCREENING MAMMO W/CAD  - [DATE]  [DATE]

RESULT:     There is a history of a previous negative right breast biopsy.
There is no documented family history of breast cancer. Comparison is made
to digital images of [DATE] as well as [DATE] and [DATE]. The
breasts exhibit a dense parenchymal pattern. There is a scar marker in the
lateral upper mid left breast. Some rounded parenchymal densities seen
previously are less prominent consistent with fibrocystic change.
IMPRESSION: 1.     Benign-appearing bilateral mammogram.
2.     BI-RADS:  Category 2- Benign Finding.
3.     Please continue to encourage annual mammographic followup and monthly
breast self-examination.

A negative mammogram report does not preclude biopsy or other evaluation of
a clinically palpable or otherwise suspicious mass or lesion.  Breast cancer
may not be detected by mammography in up to 10% of cases.

## 2010-06-05 ENCOUNTER — Encounter: Payer: Self-pay | Admitting: Dentist

## 2010-06-24 ENCOUNTER — Encounter: Payer: Self-pay | Admitting: Dentist

## 2010-11-27 ENCOUNTER — Ambulatory Visit: Payer: Self-pay | Admitting: Anesthesiology

## 2010-11-27 IMAGING — CR DG CHEST 2V
1 series · 2 of 2 positions shown · non-contrast
Comparison: none

REASON FOR EXAM: cough Dyspnea Please Call Result [PHONE_NUMBER]
COMMENTS:

[Series 1: view not recorded · 0.17mm/px · 2 of 2 slices shown]
[im 1/2]
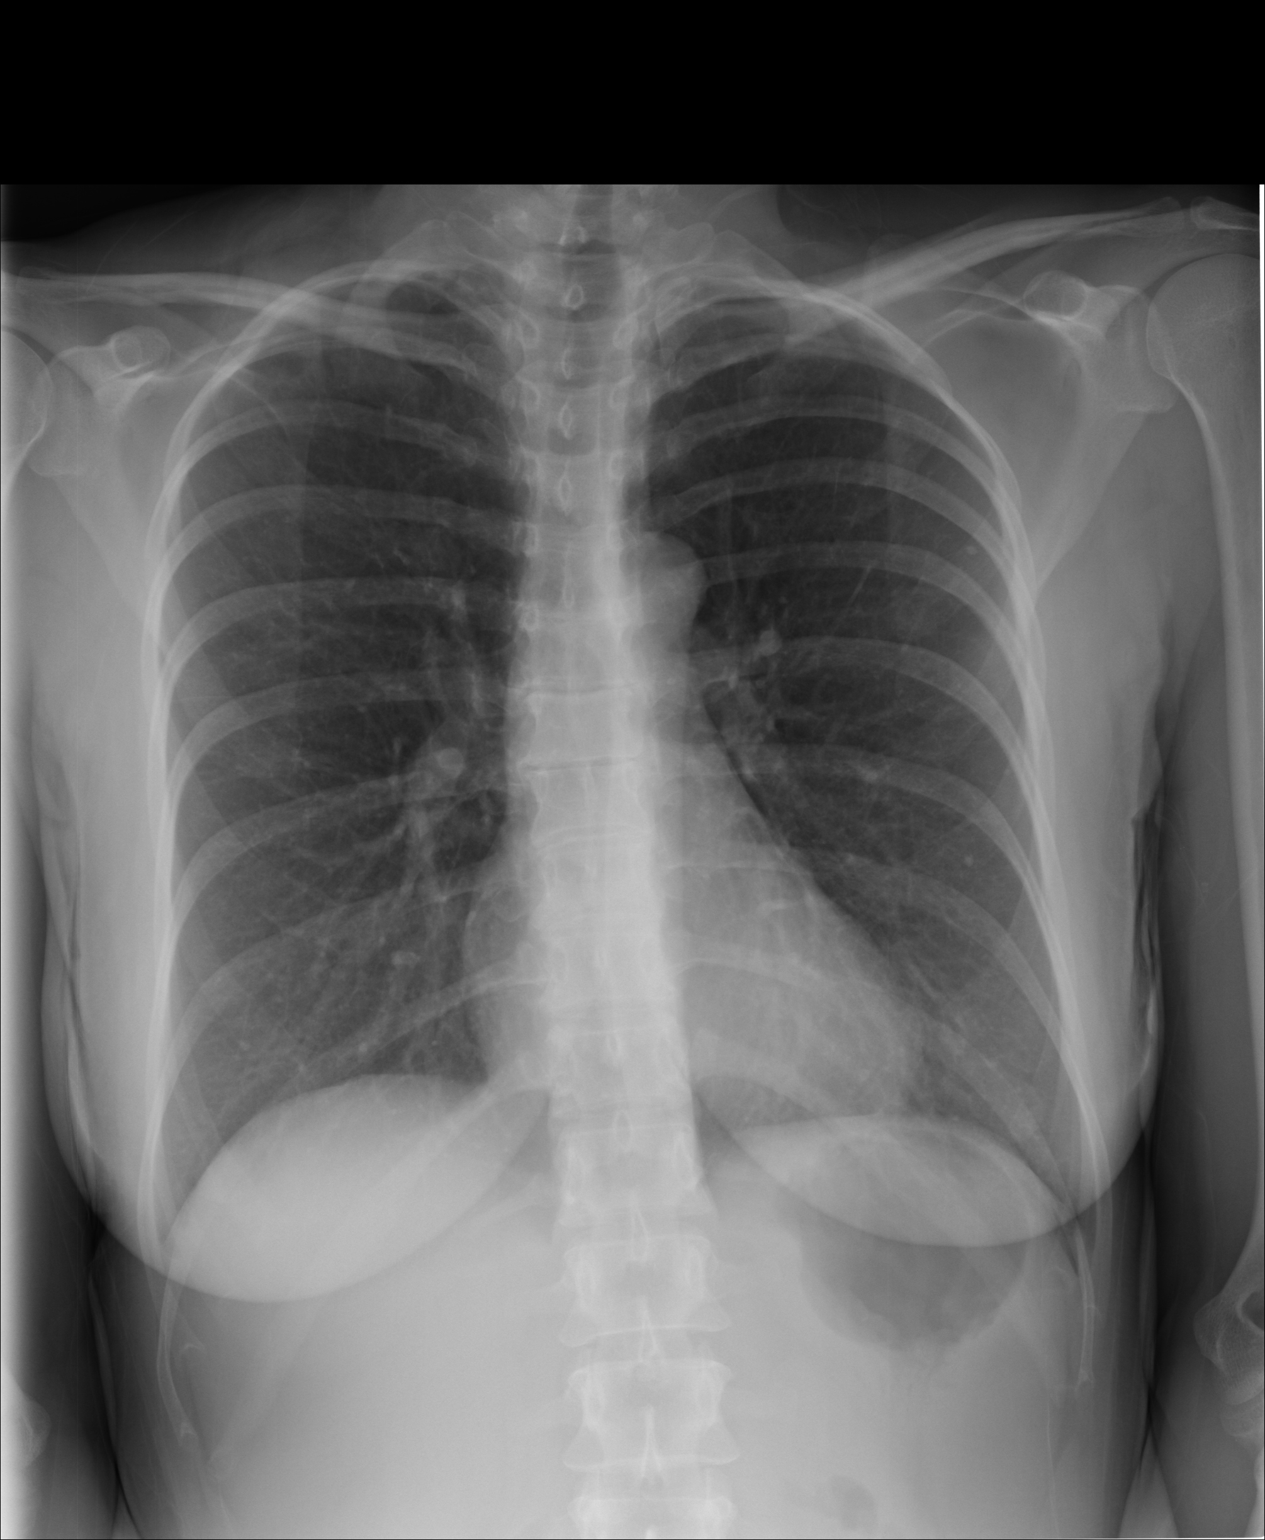
[im 2/2]
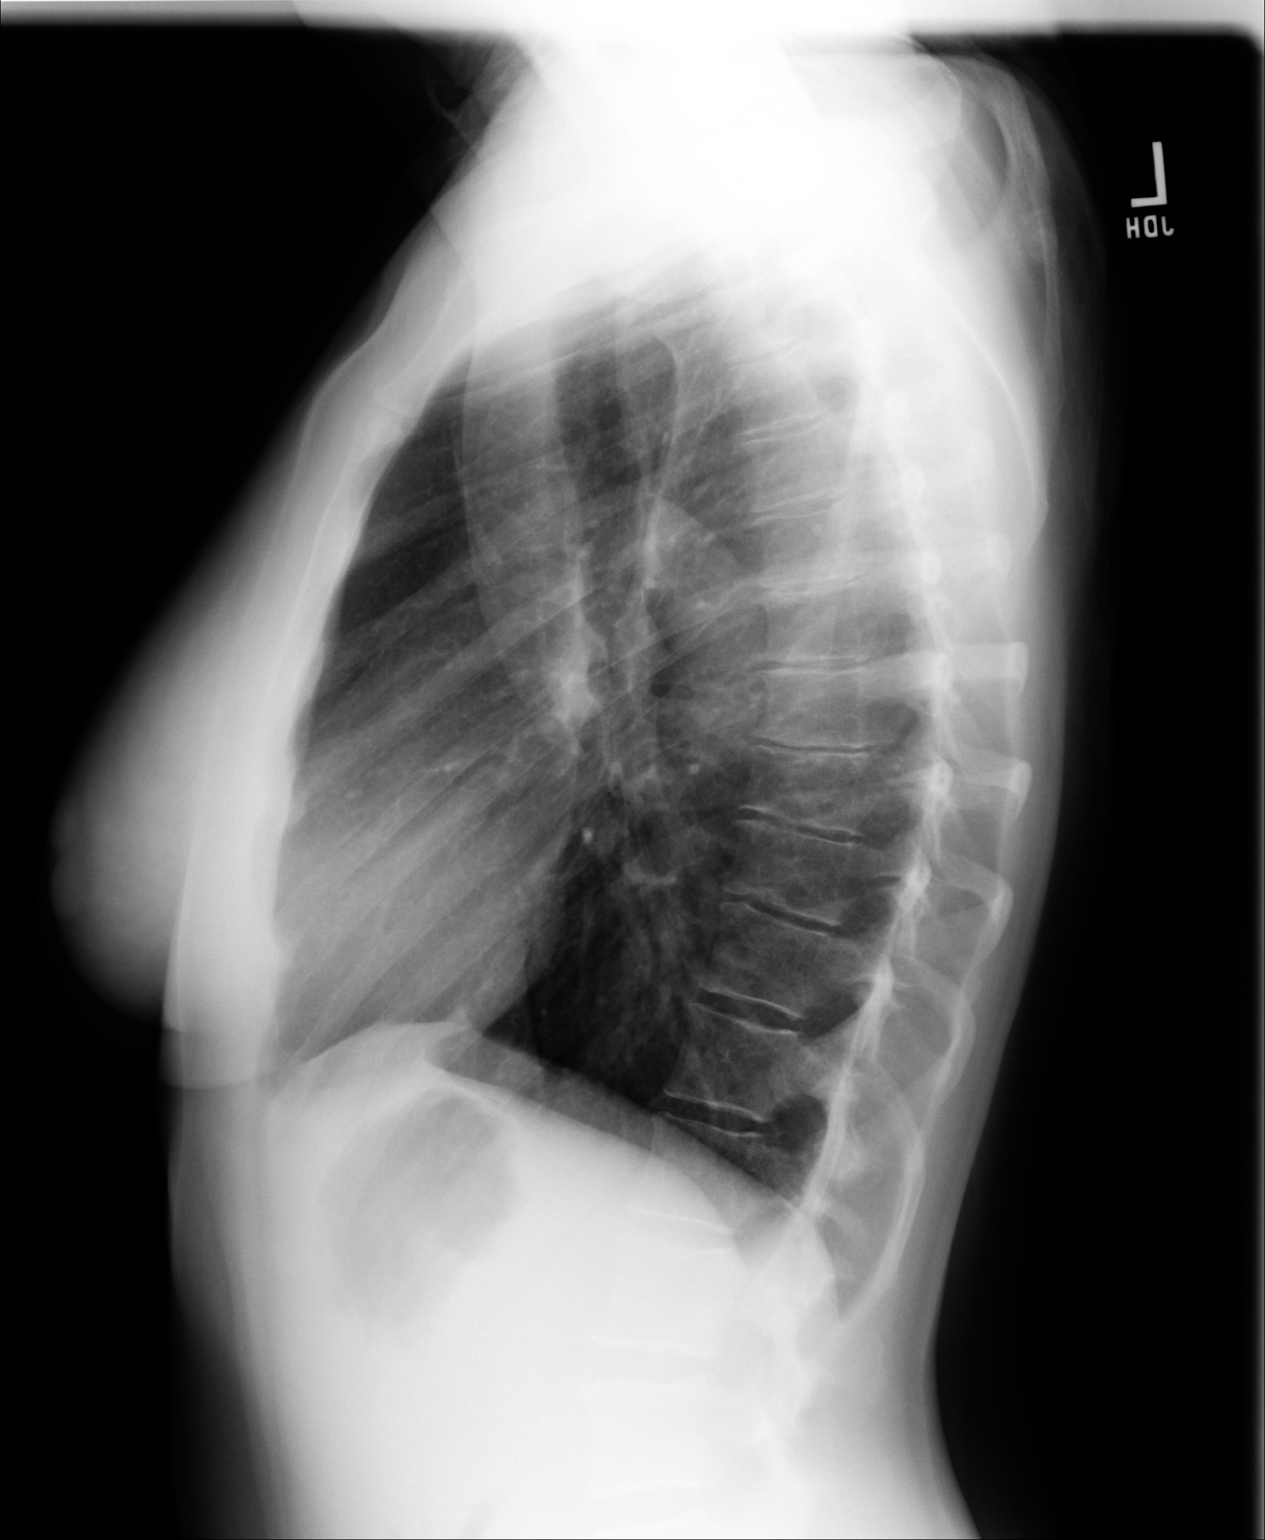

[2 of 2 positions shown; findings below may reference images not displayed]

PROCEDURE:     DXR - DXR CHEST PA (OR AP) AND LATERAL  - [DATE]  [DATE]

RESULT:     The lung fields are clear. No pneumonia, pneumothorax or pleural
effusion is seen. Heart size is normal. No acute bony abnormalities are
seen. The chest appears bilaterally hyperinflated. This is nonspecific and
can be due to body habitus but would raise the question of asthma.
IMPRESSION: 1. The lung fields are clear.
2. The chest appears mildly hyperinflated bilaterally.

## 2011-03-06 ENCOUNTER — Ambulatory Visit: Payer: Self-pay | Admitting: General Surgery

## 2011-03-06 IMAGING — MG MM CAD SCREENING MAMMO
1 series · 4 of 4 positions shown · non-contrast
Comparison: none

REASON FOR EXAM: scr mammo no order
COMMENTS:

[Series 9175: R CC · right · 4 of 4 slices shown]
[im 1/4]
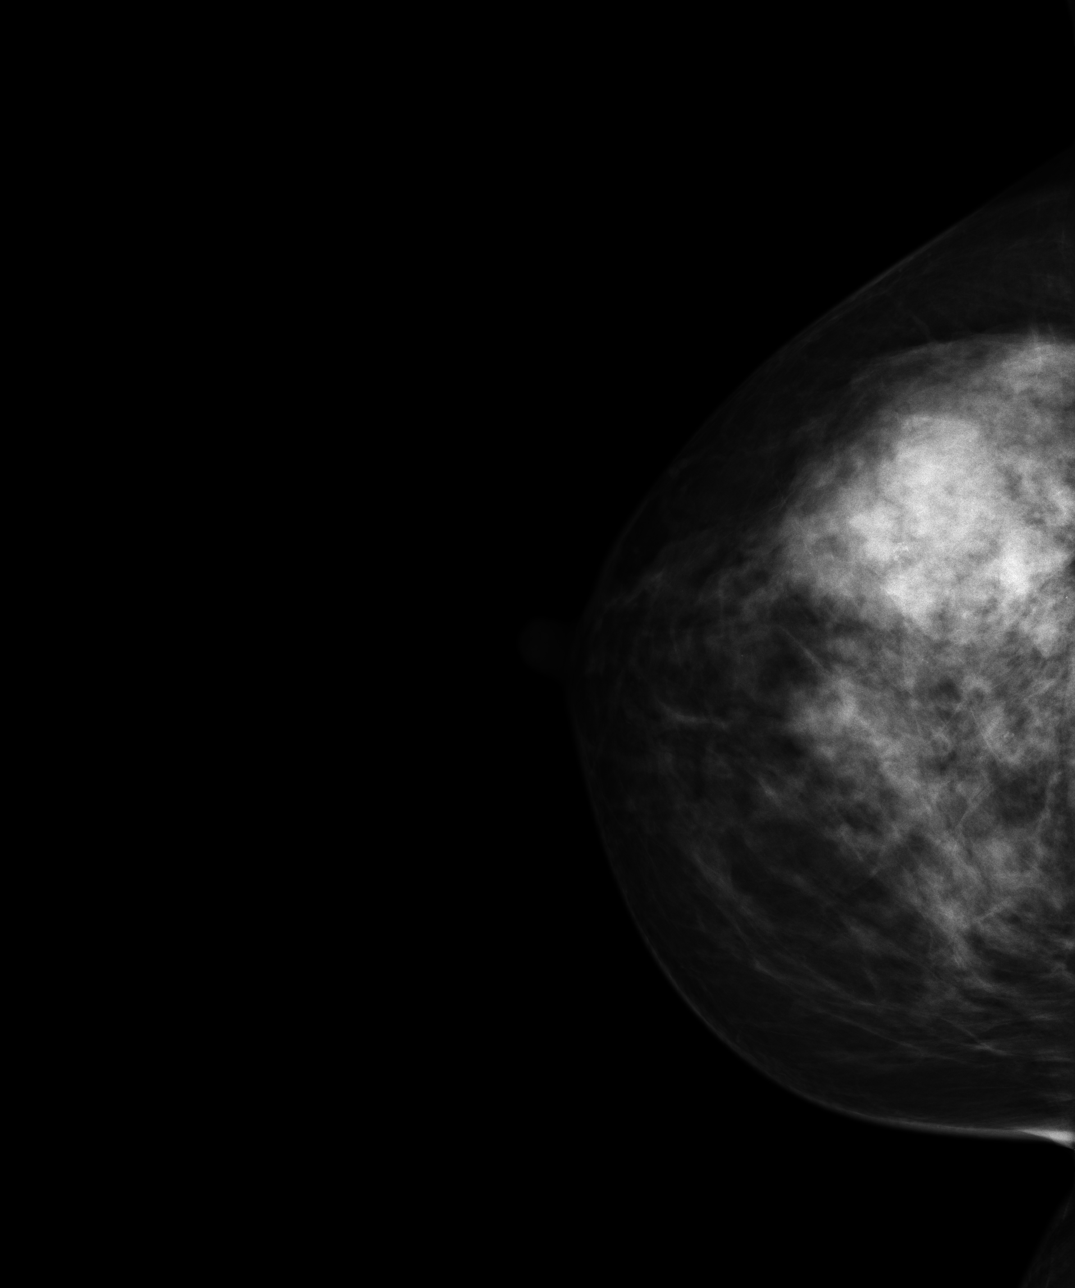
[im 2/4]
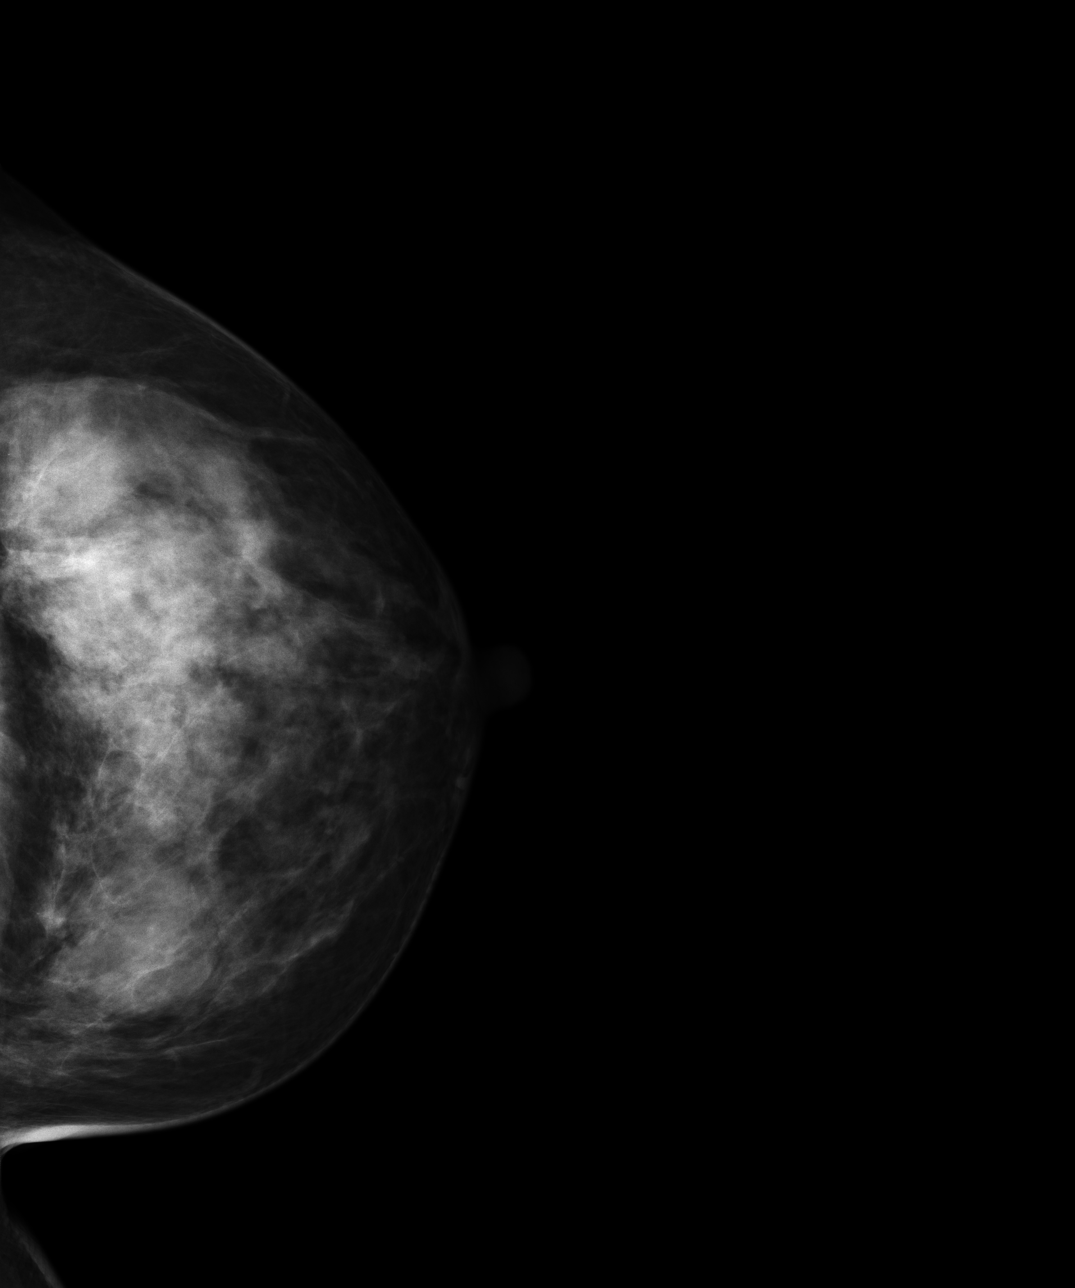
[im 3/4]
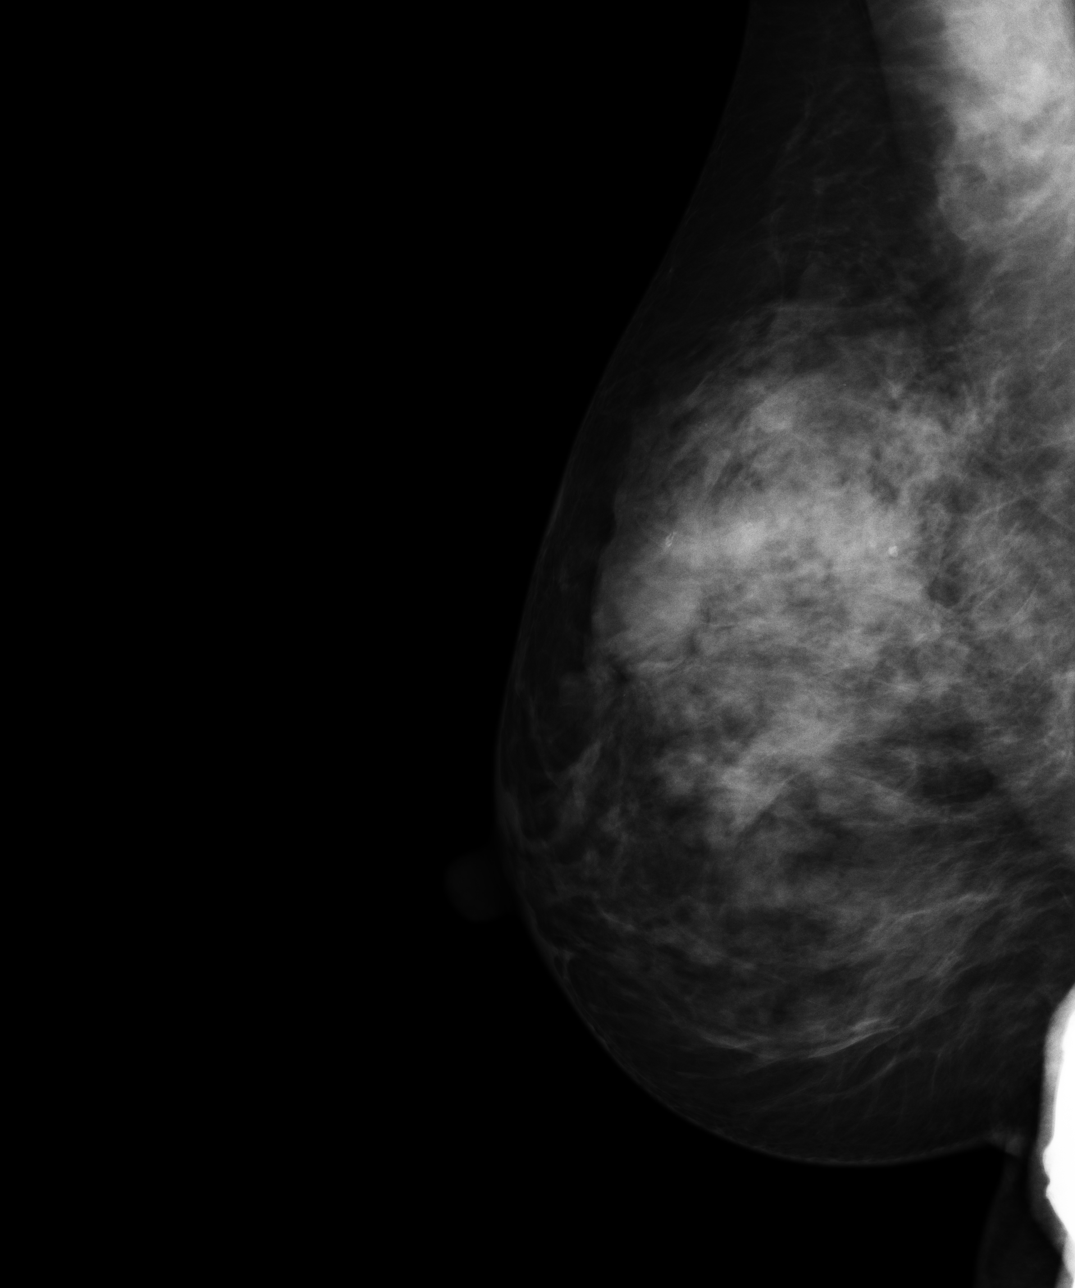
[im 4/4]
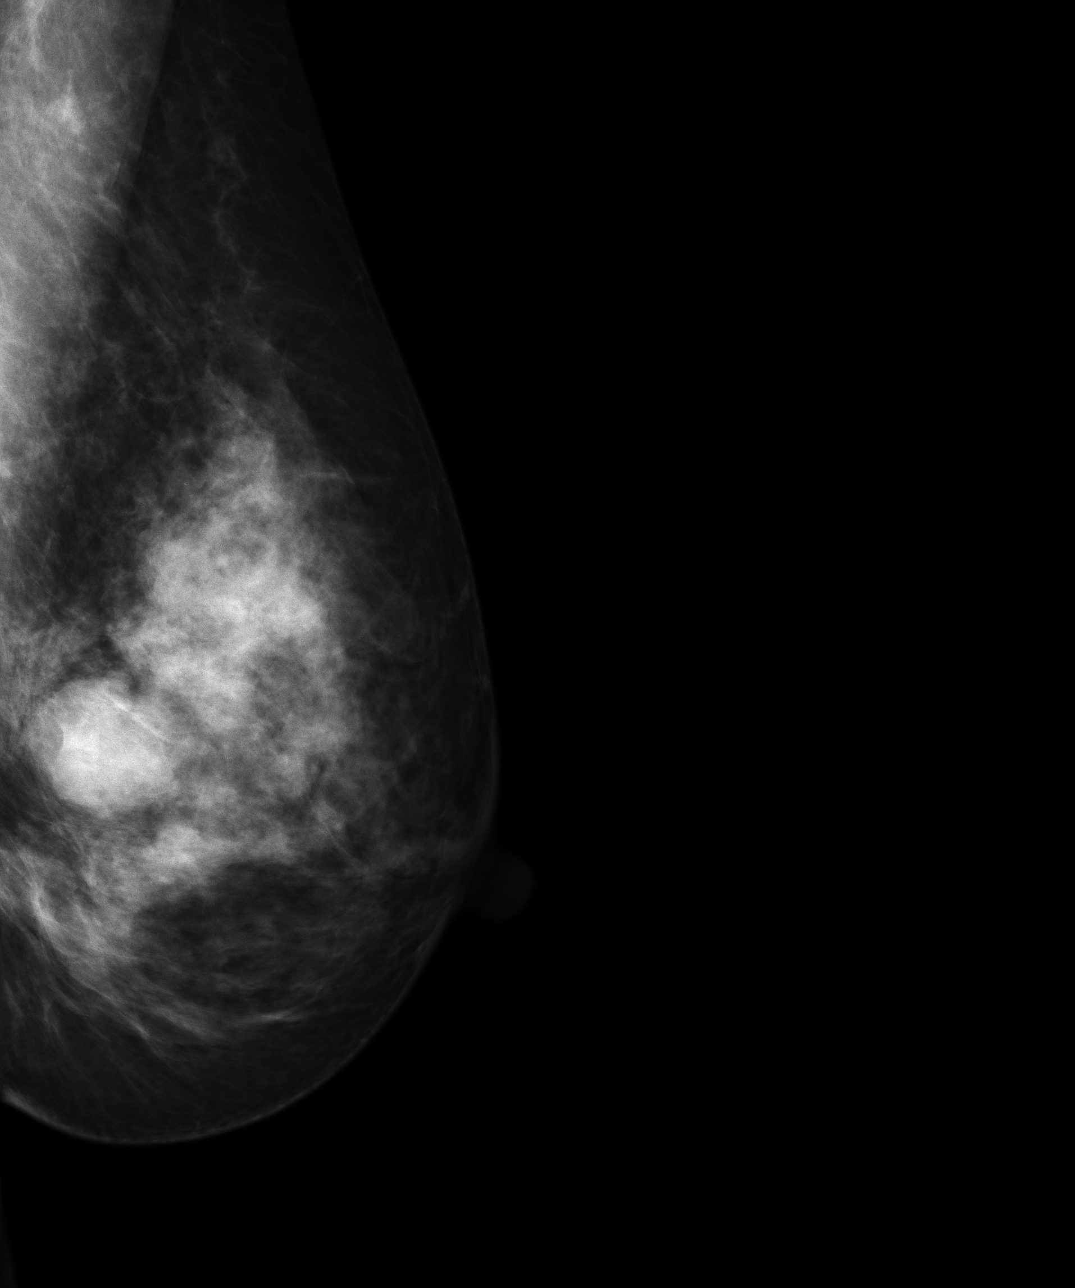

[4 of 4 positions shown; findings below may reference images not displayed]

PROCEDURE:     MAM - MAM DGTL SCRN MAM NO ORDER W/CAD  - [DATE]  [DATE]

RESULT:

The previously identified, prominent rounded densities have improved. These
are consistent with cysts. Persistent nodular density is noted in the left
breast, unchanged. Dense nodular parenchymal pattern is present. Benign
calcifications are again noted. Yearly follow-up exams are suggested.
IMPRESSION: BI-RADS: Category 2 - Benign Findings

Thank you for this opportunity to contribute to the care of your patient.

A NEGATIVE MAMMOGRAM REPORT DOES NOT PRECLUDE BIOPSY OR OTHER EVALUATION OF
A CLINICALLY PALPABLE OR OTHERWISE SUSPICIOUS MASS OR LESION. BREAST CANCER
MAY NOT BE DETECTED BY MAMMOGRAPHY IN UP TO 10% OF CASES.

## 2011-03-11 ENCOUNTER — Other Ambulatory Visit: Payer: Self-pay

## 2011-03-11 LAB — TSH: Thyroid Stimulating Horm: 2.86 u[IU]/mL

## 2011-03-12 ENCOUNTER — Encounter: Payer: Self-pay | Admitting: Orthopedic Surgery

## 2011-03-27 ENCOUNTER — Encounter: Payer: Self-pay | Admitting: Orthopedic Surgery

## 2011-04-24 ENCOUNTER — Encounter: Payer: Self-pay | Admitting: Orthopedic Surgery

## 2011-05-25 ENCOUNTER — Encounter: Payer: Self-pay | Admitting: Orthopedic Surgery

## 2011-11-11 ENCOUNTER — Ambulatory Visit: Payer: Self-pay | Admitting: Podiatry

## 2011-11-11 HISTORY — PX: BUNIONECTOMY: SHX129

## 2012-01-18 ENCOUNTER — Encounter: Payer: Self-pay | Admitting: Podiatry

## 2012-01-24 ENCOUNTER — Encounter: Payer: Self-pay | Admitting: Podiatry

## 2012-02-24 ENCOUNTER — Encounter: Payer: Self-pay | Admitting: Podiatry

## 2012-03-07 ENCOUNTER — Ambulatory Visit: Payer: Self-pay | Admitting: General Surgery

## 2012-03-07 IMAGING — MG MM CAD SCREENING MAMMO
1 series · 4 of 4 positions shown · non-contrast
Comparison: none

REASON FOR EXAM: SCR MAMMO NO ORDER
COMMENTS:

PROCEDURE:     MAM - MAM DGTL SCRN MAM NO ORDER W/CAD  - [DATE] [DATE]
RESULT:     Breast are dense and nodular. Rounded densities in both breast
most consistent with cysts and are stable. Benign calcifications. CAD
evaluation nonfocal.

[R CC · right · 4 of 4 slices shown]
[im 1/4]
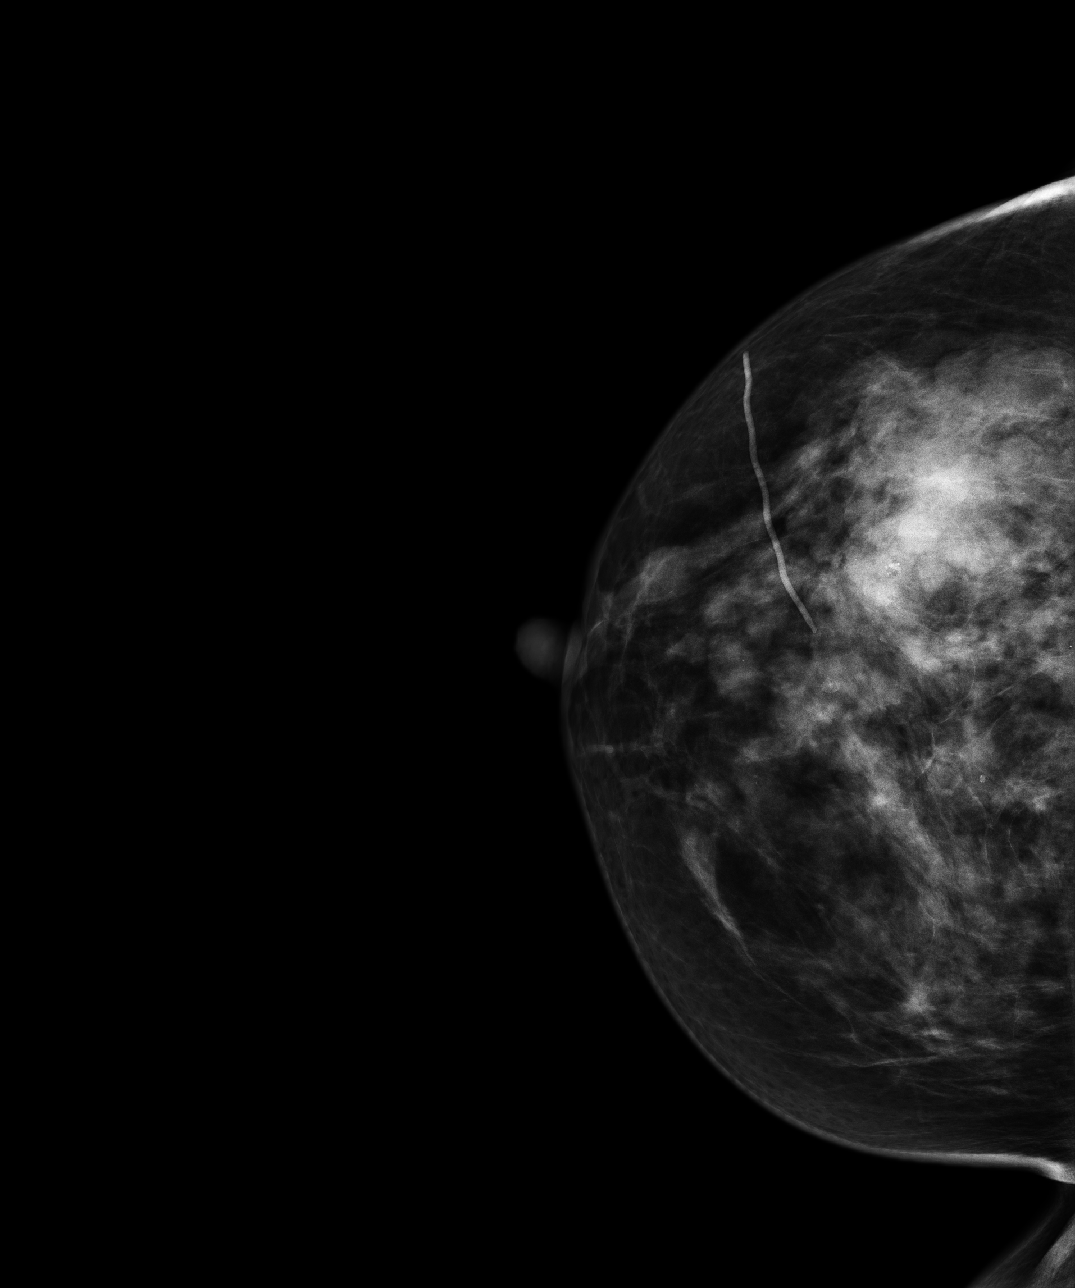
[im 2/4]
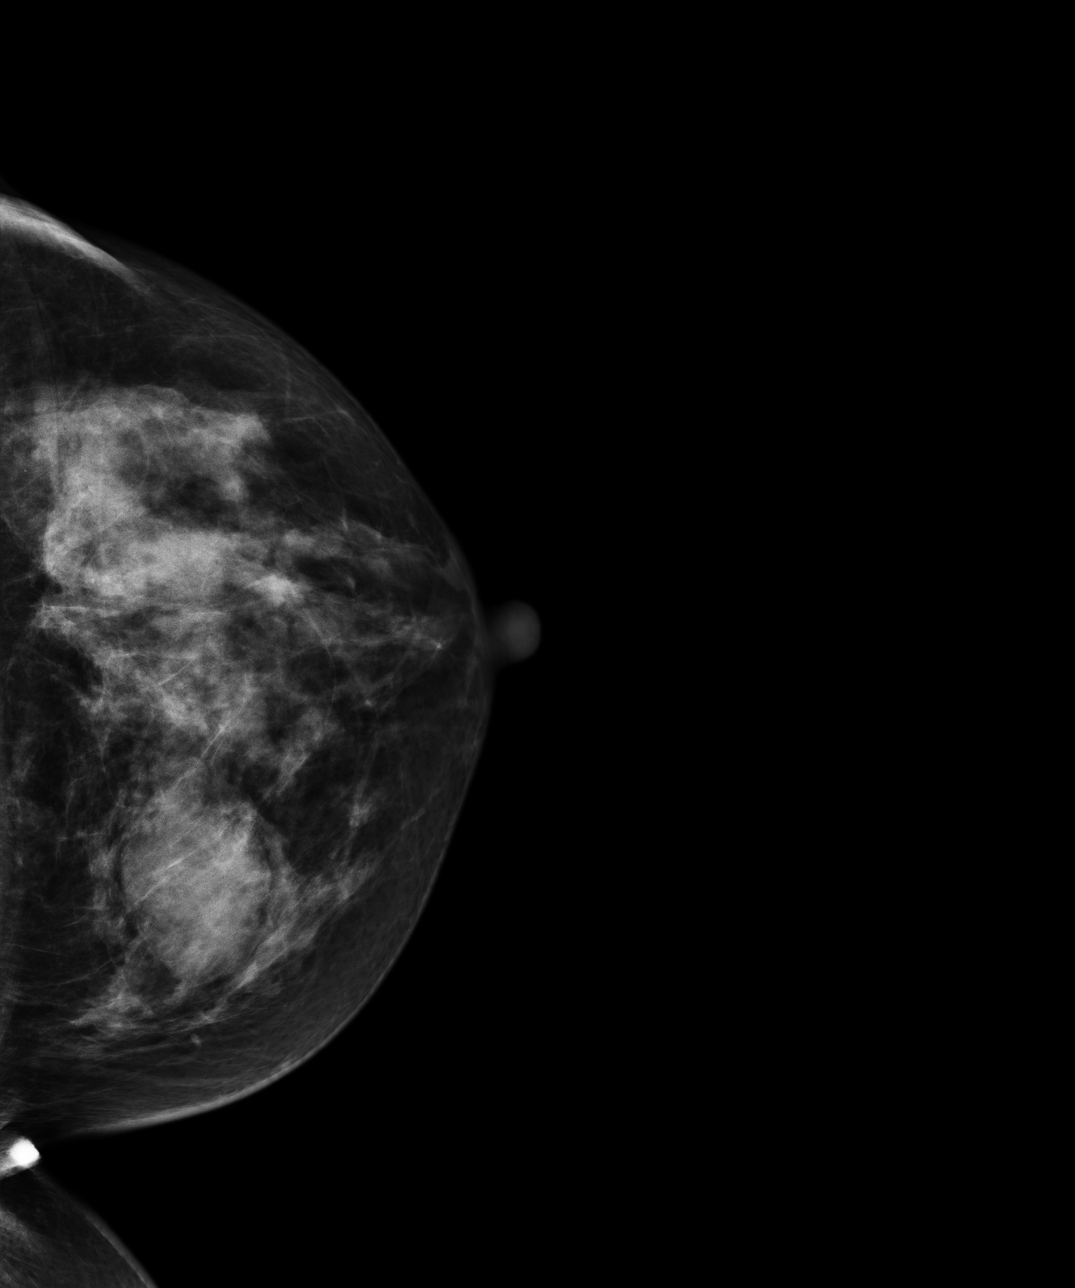
[im 3/4]
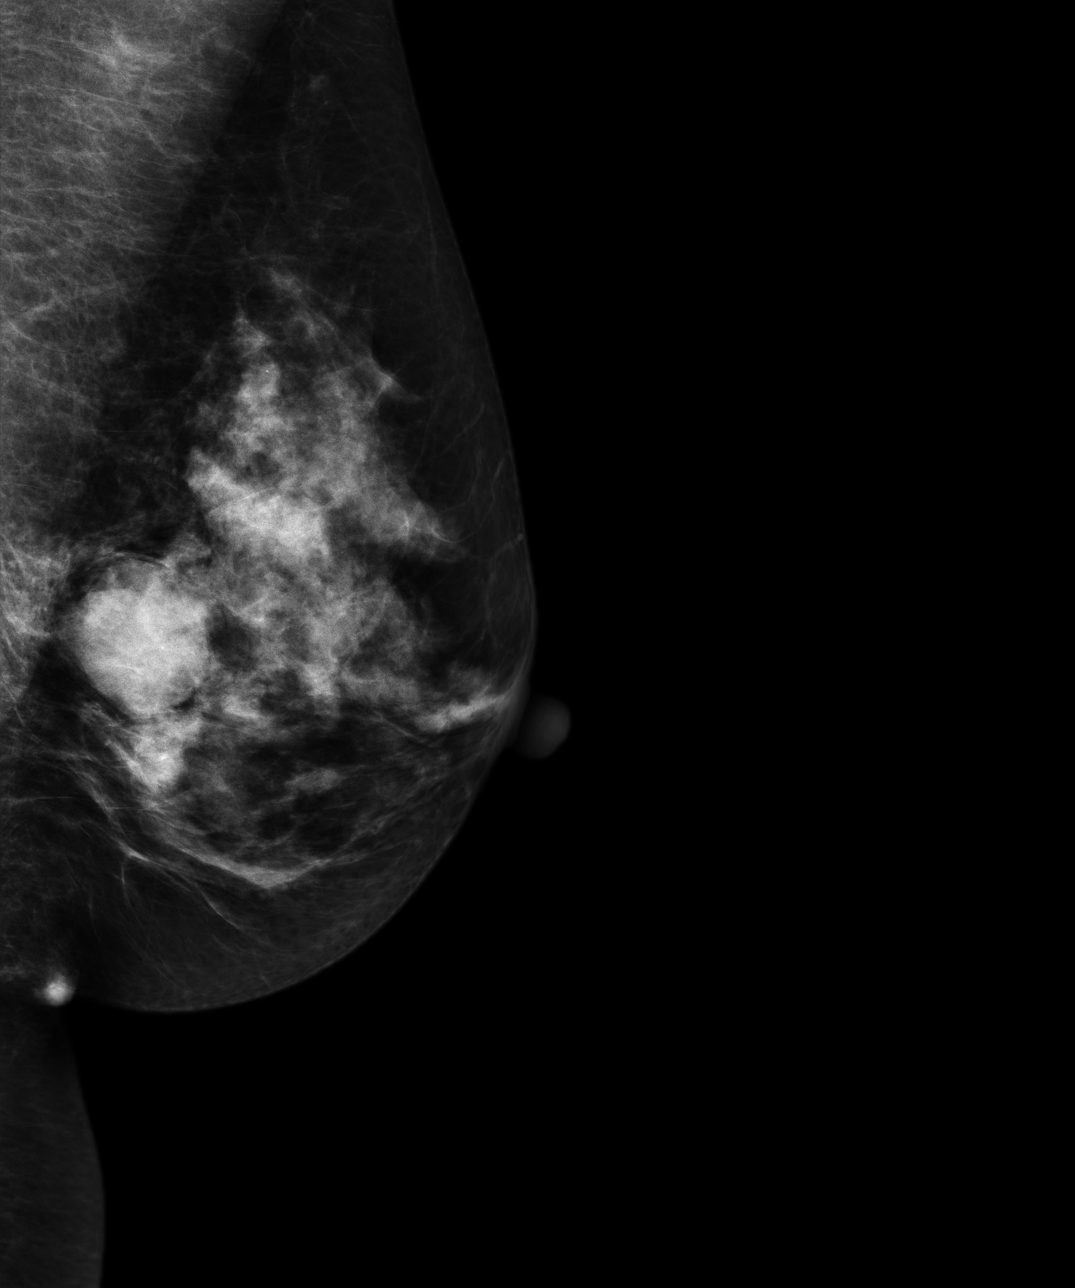
[im 4/4]
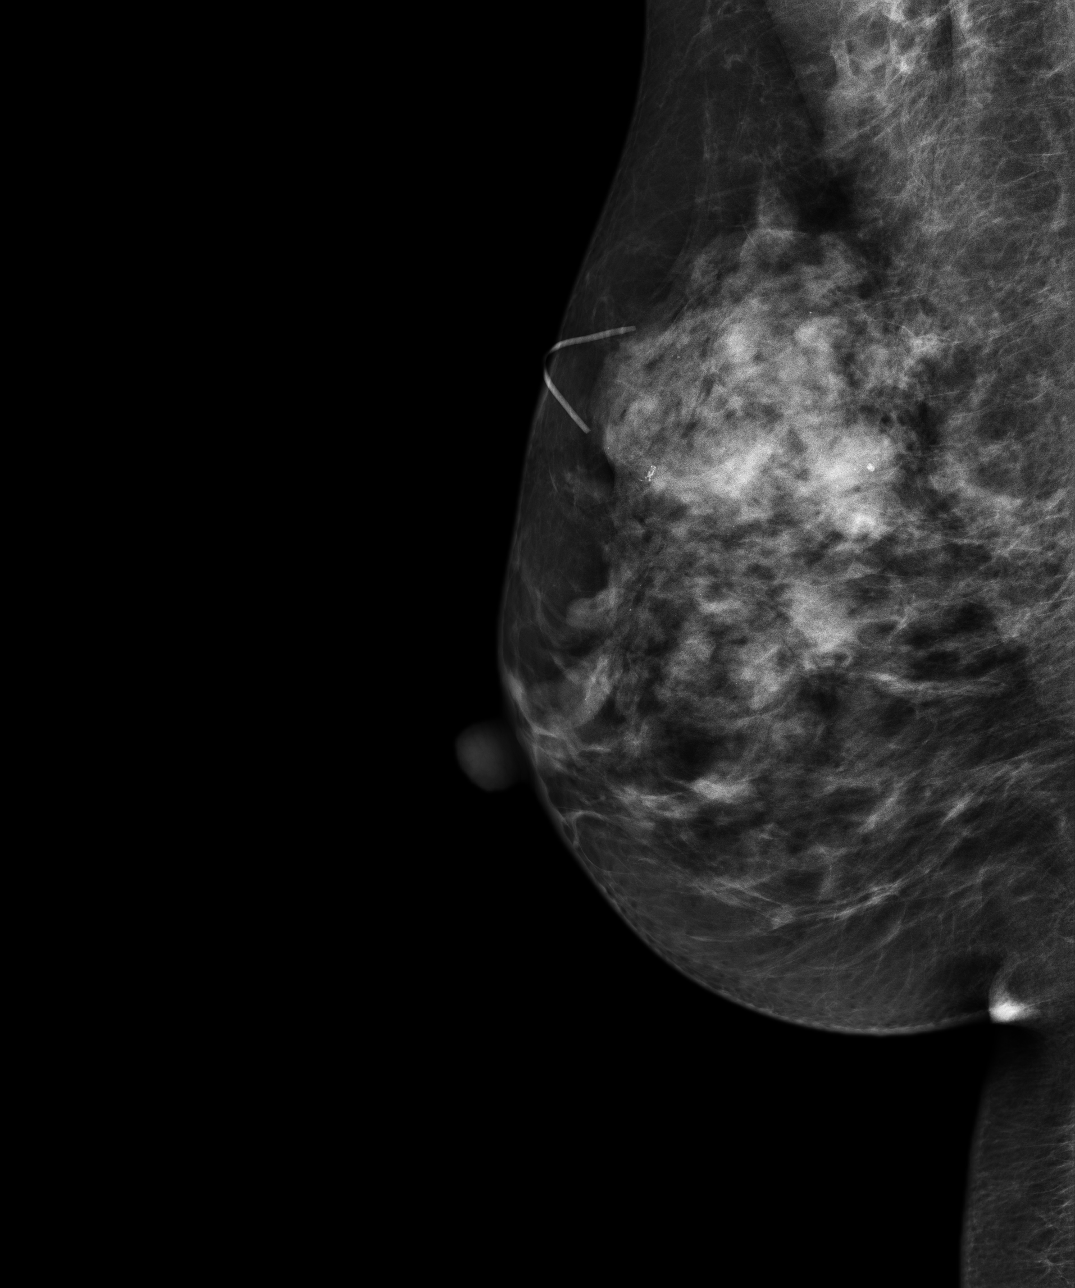

[4 of 4 positions shown; findings below may reference images not displayed]

IMPRESSION: Benign exam. Yearly followup exam suggested.

BI-RADS: Category 2- Benign Finding

A NEGATIVE MAMMOGRAM REPORT DOES NOT PRECLUDE BIOPSY OR OTHER EVALUATION OF
A CLINICALLY PALPABLE OR OTHERWISE SUSPICIOUS MASS OR LESION. BREAST CANCER
MAY NOT BE DETECTED IN UP TO 10% OF CASES.

Thank you for the oppurtunity to contribute to the care of your patient. .

BREAST COMPOSITION: The breast composition is EXTREMELY DENSE (glandular
tissue greater than 75%) This decreases the sensitivity of mammography.

## 2012-05-03 ENCOUNTER — Encounter: Payer: Self-pay | Admitting: *Deleted

## 2012-05-11 ENCOUNTER — Encounter: Payer: Self-pay | Admitting: General Surgery

## 2012-05-11 ENCOUNTER — Ambulatory Visit (INDEPENDENT_AMBULATORY_CARE_PROVIDER_SITE_OTHER): Payer: 59 | Admitting: General Surgery

## 2012-05-11 VITALS — BP 100/70 | HR 80 | Resp 12 | Ht 61.0 in | Wt 126.0 lb

## 2012-05-11 DIAGNOSIS — N6019 Diffuse cystic mastopathy of unspecified breast: Secondary | ICD-10-CM

## 2012-05-11 NOTE — Progress Notes (Signed)
Subjective:     Patient ID: Amanda Skinner, female   DOB: 1960/06/12, 52 y.o.   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  No pain. No discharge from nipple. FH- no cancers.PaTIENT HAD MAMMOGRAM DONE 02/2012.   Review of Systems  Constitutional: Negative.   Cardiovascular: Negative.        Objective:   Physical Exam  Constitutional: She appears well-developed and well-nourished.  Eyes: Conjunctivae are normal. No scleral icterus.  Neck: Normal range of motion. Neck supple. No thyromegaly present.  Cardiovascular: Normal rate, regular rhythm and normal heart sounds.   Pulmonary/Chest: Breath sounds normal. Right breast exhibits tenderness. Right breast exhibits no inverted nipple, no mass, no nipple discharge and no skin change. Left breast exhibits tenderness. Left breast exhibits no inverted nipple, no mass, no nipple discharge and no skin change. Breasts are symmetrical.    Lymphadenopathy:    She has no cervical adenopathy.       Assessment:     Stable exam. Mammogram shows dense nodular pattern, no new findings     Plan:     F/U 1 yr

## 2012-08-29 ENCOUNTER — Other Ambulatory Visit: Payer: Self-pay | Admitting: Physician Assistant

## 2012-08-29 LAB — CBC WITH DIFFERENTIAL/PLATELET
Eosinophil #: 0.1 10*3/uL (ref 0.0–0.7)
Eosinophil %: 2.3 %
HCT: 40.9 % (ref 35.0–47.0)
Lymphocyte #: 1.6 10*3/uL (ref 1.0–3.6)
Lymphocyte %: 25.2 %
MCH: 32 pg (ref 26.0–34.0)
Monocyte %: 6.5 %
RBC: 4.53 10*6/uL (ref 3.80–5.20)
RDW: 12.7 % (ref 11.5–14.5)

## 2012-08-29 LAB — TSH: Thyroid Stimulating Horm: 3.69 u[IU]/mL

## 2013-04-14 ENCOUNTER — Ambulatory Visit: Payer: Self-pay | Admitting: General Surgery

## 2013-04-14 IMAGING — MG MM DIGITAL SCREENING BILAT W/ CAD
1 series · 4 of 4 positions shown · non-contrast
Comparison: Previous exam(s).

CLINICAL DATA: Screening.

EXAM:
DIGITAL SCREENING BILATERAL MAMMOGRAM WITH CAD

[R CC · right · 4 of 4 slices shown]
[im 1/4]
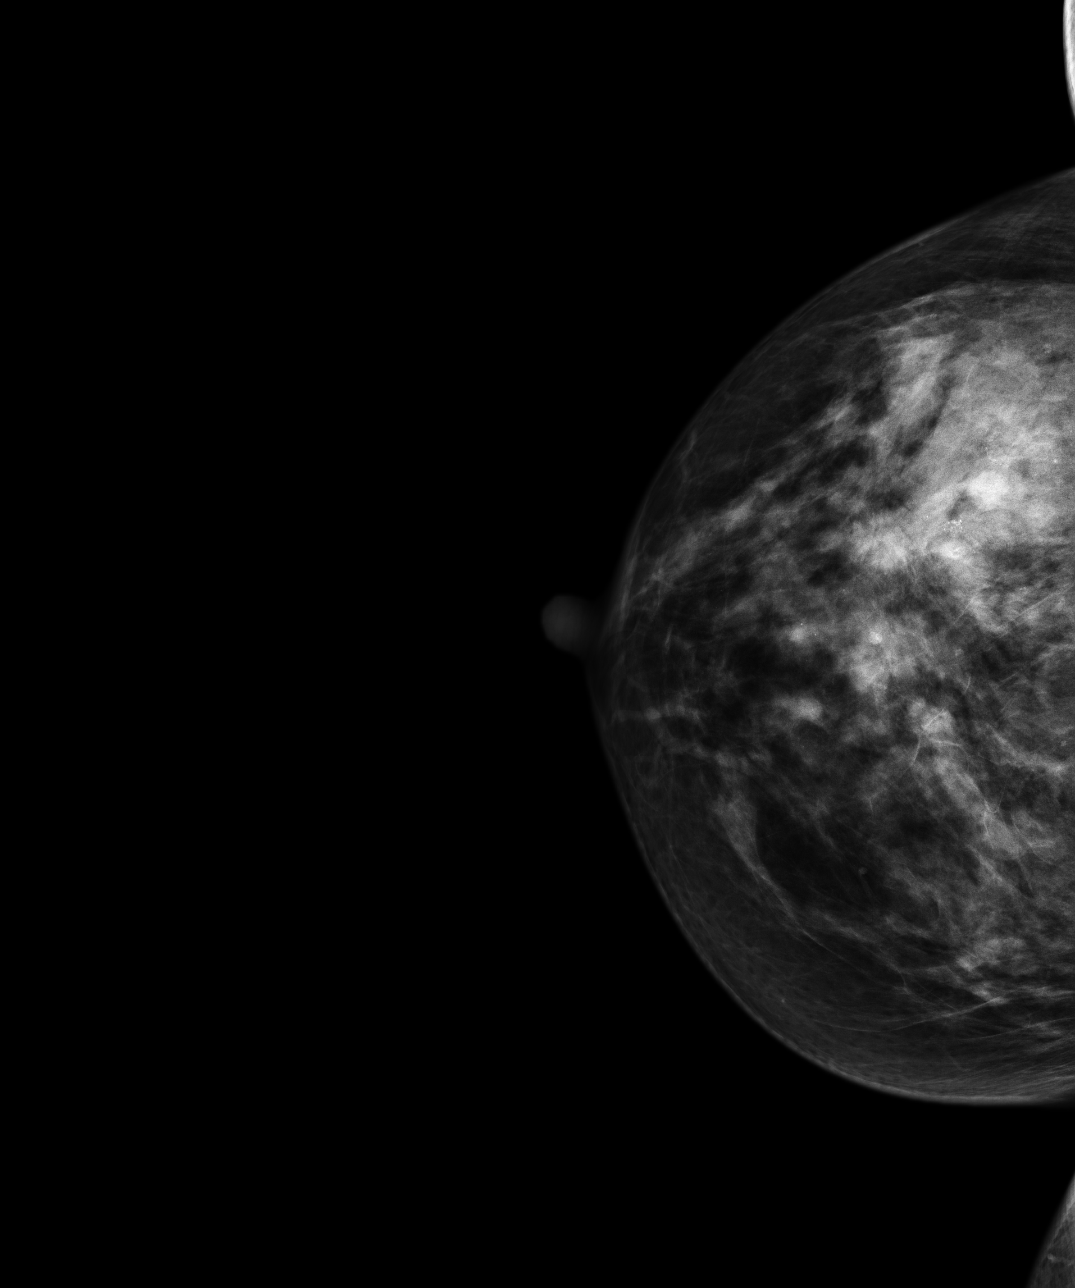
[im 2/4]
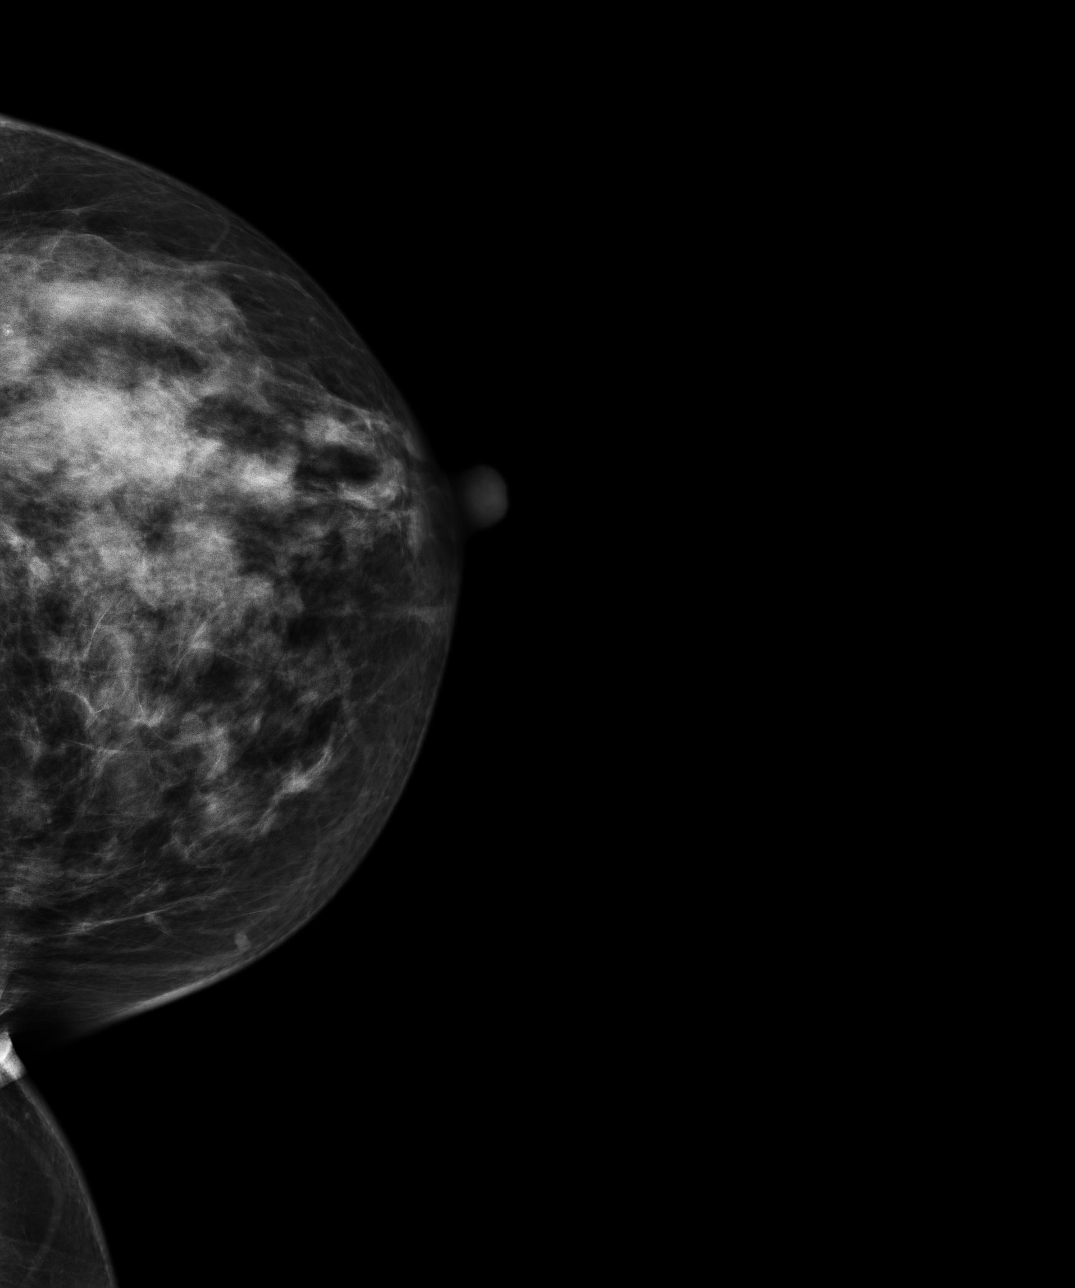
[im 3/4]
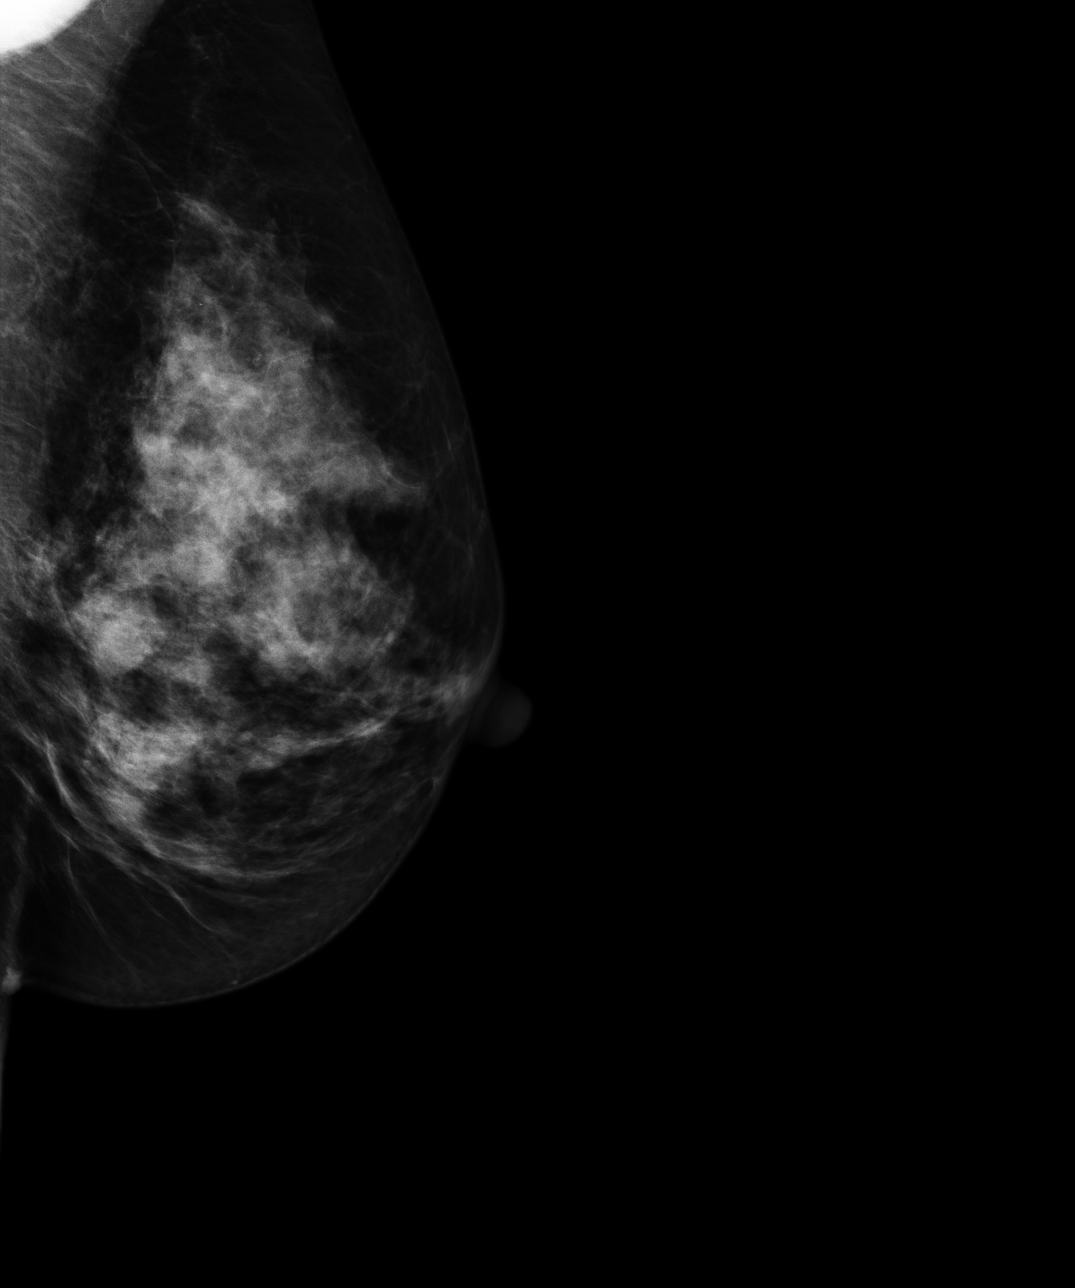
[im 4/4]
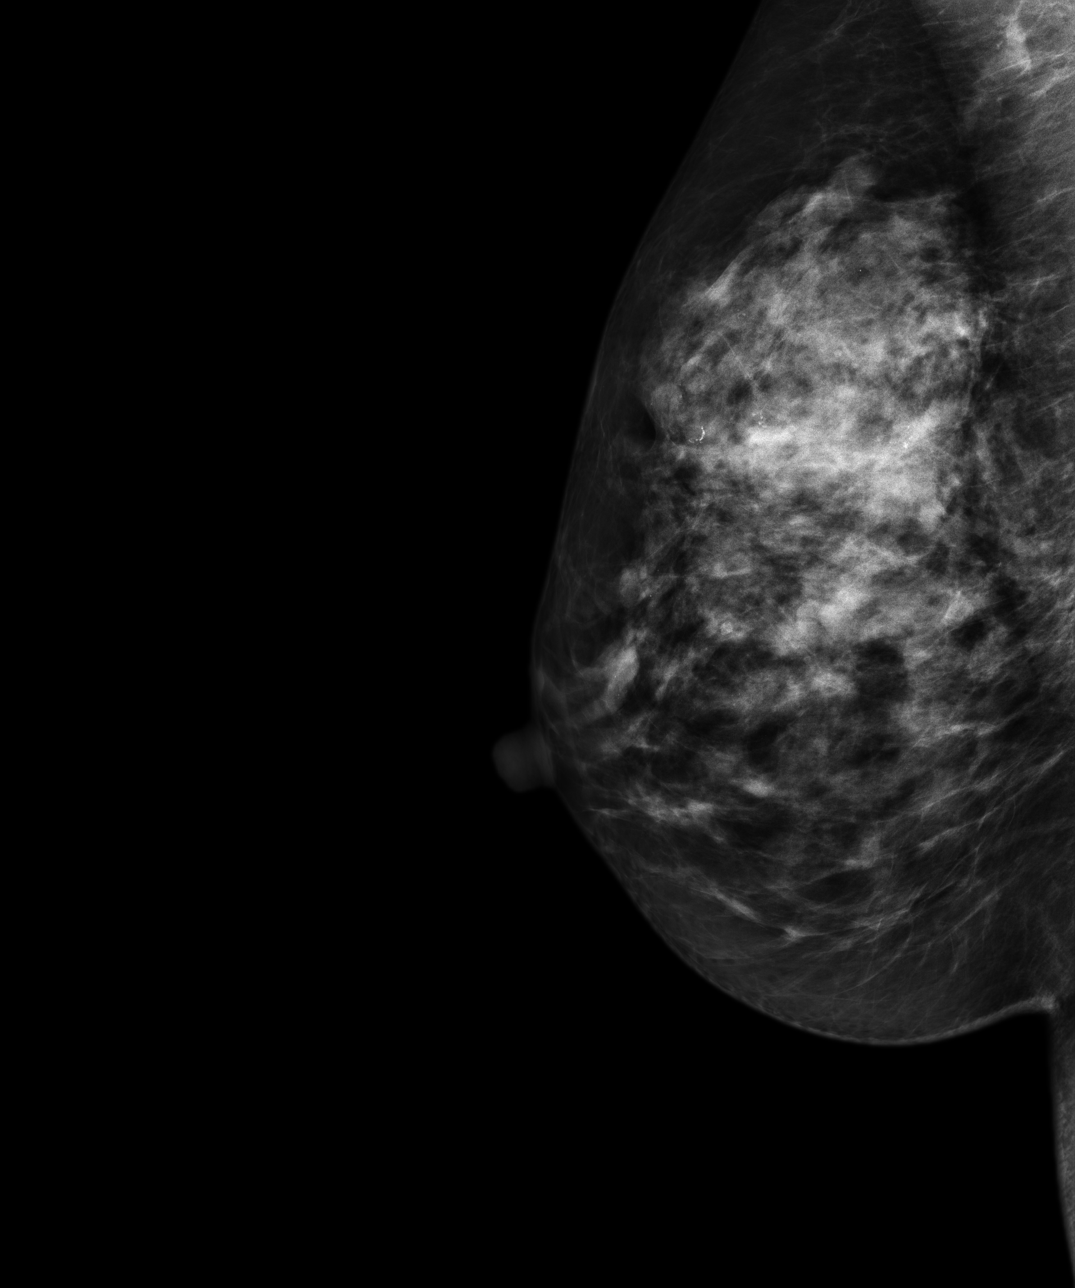

[4 of 4 positions shown; findings below may reference images not displayed]

ACR Breast Density Category d: The breast tissue is extremely dense,
which lowers the sensitivity of mammography.
FINDINGS: There are no findings suspicious for malignancy. Images were
processed with CAD.
IMPRESSION: No mammographic evidence of malignancy. A result letter of this
screening mammogram will be mailed directly to the patient.

RECOMMENDATION:
Screening mammogram in one year. (Code:[ZH])

BI-RADS CATEGORY  1: Negative.

## 2013-04-16 ENCOUNTER — Encounter: Payer: Self-pay | Admitting: General Surgery

## 2013-05-04 ENCOUNTER — Encounter: Payer: Self-pay | Admitting: General Surgery

## 2013-05-04 ENCOUNTER — Ambulatory Visit (INDEPENDENT_AMBULATORY_CARE_PROVIDER_SITE_OTHER): Payer: 59 | Admitting: General Surgery

## 2013-05-04 ENCOUNTER — Ambulatory Visit: Payer: 59 | Admitting: General Surgery

## 2013-05-04 VITALS — BP 120/74 | HR 70 | Resp 12 | Ht 60.0 in | Wt 125.0 lb

## 2013-05-04 DIAGNOSIS — N6019 Diffuse cystic mastopathy of unspecified breast: Secondary | ICD-10-CM

## 2013-05-04 NOTE — Patient Instructions (Signed)
Patient to return in 1 year with bilateral screening mammogram. Continue self breast exams. Call office for any new breast issues or concerns.  

## 2013-05-04 NOTE — Progress Notes (Signed)
Patient ID: Amanda Skinner, female   DOB: 1961/02/20, 53 y.o.   MRN: [id number]  Chief Complaint  Patient presents with  . Follow-up    screening mammogram     HPI Amanda Skinner is a 53 y.o. female who presents for a breast evaluation. The most recent mammogram was done on 04/14/13. Patient does perform regular self breast checks and gets regular mammograms done. The patient denies any problems at this time.    HPI  Past Medical History  Diagnosis Date  . TMJ (dislocation of temporomandibular joint) 2012  . Asthma 2001  . Hypertension   . Ulcer   . Diffuse cystic mastopathy 2012    left    Past Surgical History  Procedure Laterality Date  . Rhinoplasty  1996  . Cesarean section  1991  . Tubal ligation  1991  . Tonsilloadenoidectomy   1978  . Lower extremity surgery   2001  . Foot surgery Left 10/2011    History reviewed. No pertinent family history.  Social History History  Substance Use Topics  . Smoking status: Never Smoker   . Smokeless tobacco: Never Used  . Alcohol Use: No    Allergies  Allergen Reactions  . Eggs Or Egg-Derived Products     Headache     Current Outpatient Prescriptions  Medication Sig Dispense Refill  . albuterol (PROVENTIL HFA;VENTOLIN HFA) 108 (90 BASE) MCG/ACT inhaler Inhale 2 puffs into the lungs every 6 (six) hours as needed for wheezing.      . calcium carbonate 200 MG capsule Take 250 mg by mouth daily.      Marland Kitchen VITAMIN D, CHOLECALCIFEROL, PO Take by mouth daily.       No current facility-administered medications for this visit.    Review of Systems Review of Systems  Constitutional: Negative.   Respiratory: Negative.   Cardiovascular: Negative.     Blood pressure 120/74, pulse 70, resp. rate 12, height 5' (1.524 m), weight 125 lb (56.7 kg).  Physical Exam Physical Exam  Constitutional: She is oriented to person, place, and time. She appears well-developed and well-nourished.  Eyes: Conjunctivae are normal. No  scleral icterus.  Neck: Neck supple. No thyromegaly present.  Cardiovascular: Normal rate, regular rhythm and normal heart sounds.   No murmur heard. Pulmonary/Chest: Effort normal. She has wheezes (mild scattered). Right breast exhibits no inverted nipple, no mass, no nipple discharge, no skin change and no tenderness. Left breast exhibits no inverted nipple, no mass, no nipple discharge, no skin change and no tenderness.  Lymphadenopathy:    She has no cervical adenopathy.    She has no axillary adenopathy.  Neurological: She is alert and oriented to person, place, and time.  Skin: Skin is warm and dry.    Data Reviewed  Mammogram reviewed.  Assessment    Stable exam. She has had multiple large cysts in past needing aspiration.    Plan    Patient to return in 1 year with bilateral screening mammogram.        Junie Panning G 05/04/2013, 3:45 PM

## 2013-05-11 ENCOUNTER — Ambulatory Visit: Payer: 59 | Admitting: General Surgery

## 2013-07-24 HISTORY — PX: COLONOSCOPY: SHX174

## 2013-08-21 ENCOUNTER — Ambulatory Visit: Payer: Self-pay | Admitting: Gastroenterology

## 2013-08-21 LAB — HM COLONOSCOPY

## 2013-09-19 DIAGNOSIS — M4306 Spondylolysis, lumbar region: Secondary | ICD-10-CM | POA: Insufficient documentation

## 2013-12-25 ENCOUNTER — Encounter: Payer: Self-pay | Admitting: General Surgery

## 2014-04-16 ENCOUNTER — Ambulatory Visit: Payer: Self-pay | Admitting: General Surgery

## 2014-04-16 IMAGING — MG MM DIGITAL SCREENING BILAT W/ TOMO W/ CAD
9 of 12 series · 9 of 28 positions shown · non-contrast
Comparison: Previous exam(s).

CLINICAL DATA: Screening.

EXAM:
DIGITAL SCREENING BILATERAL MAMMOGRAM WITH 3D TOMO WITH CAD

[L MLO]
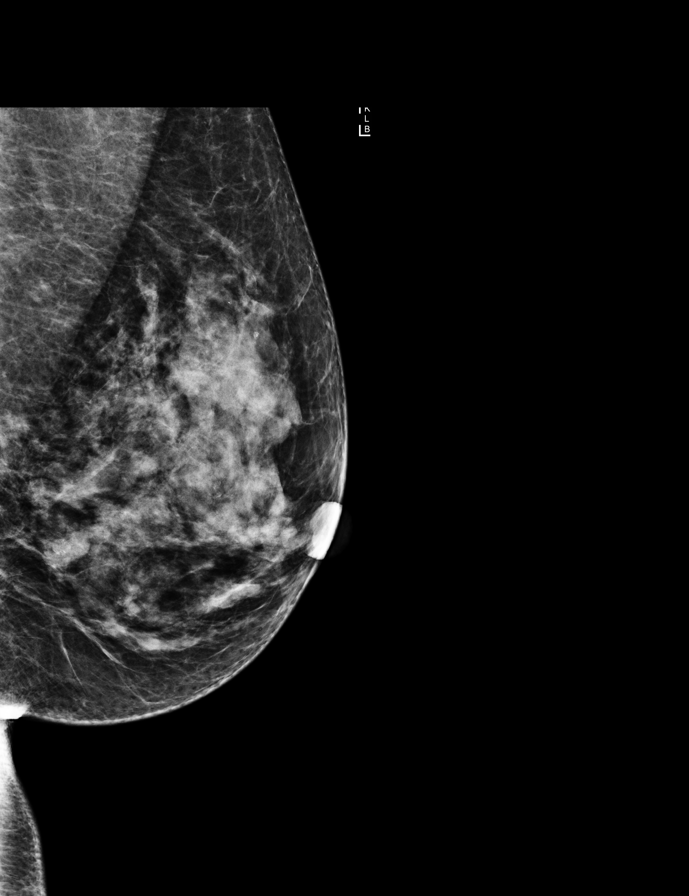

[R MLO synth-2D]
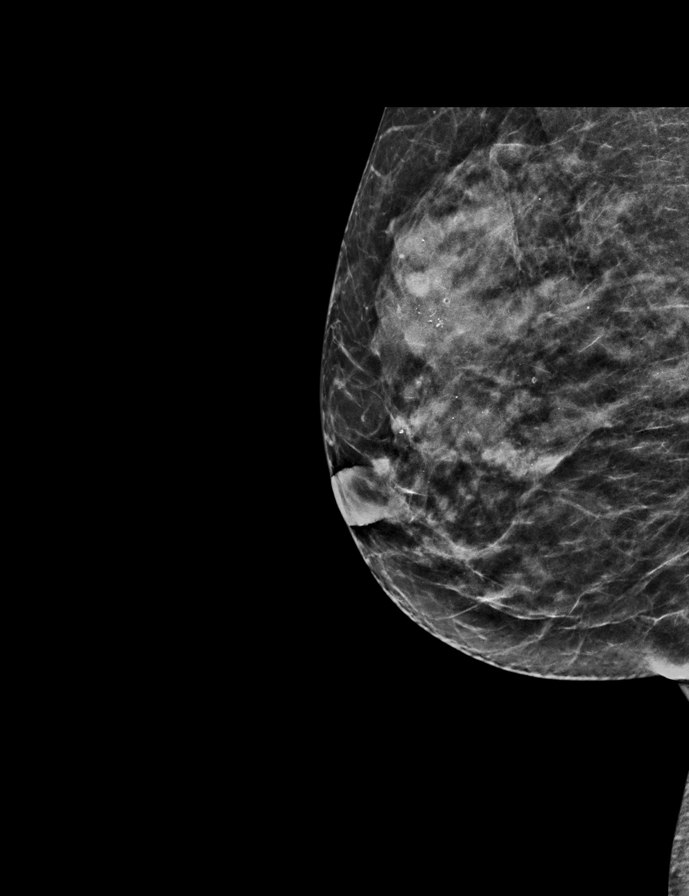

[R CC synth-2D]
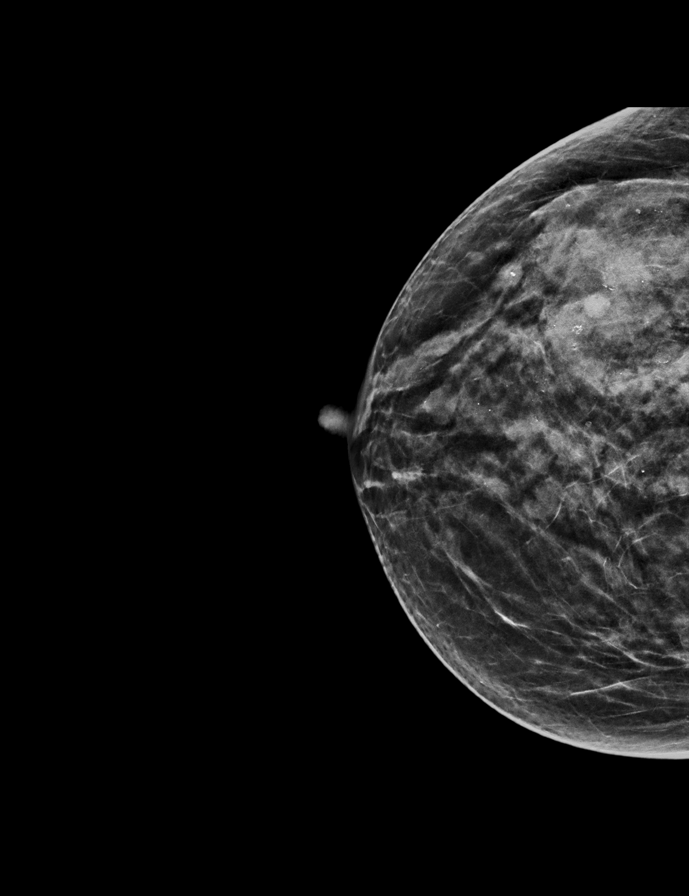

[R MLO]
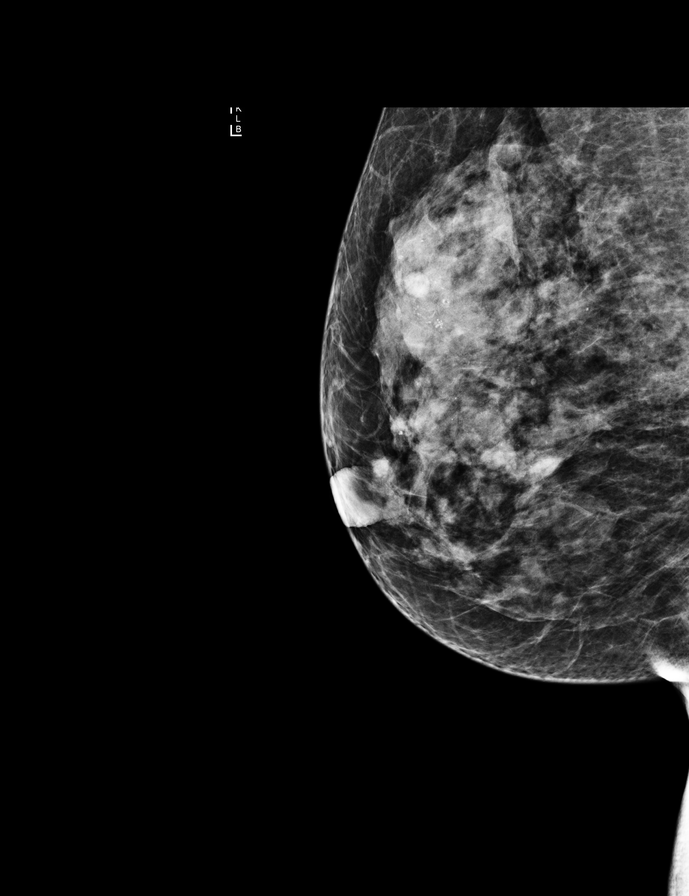

[L CC]
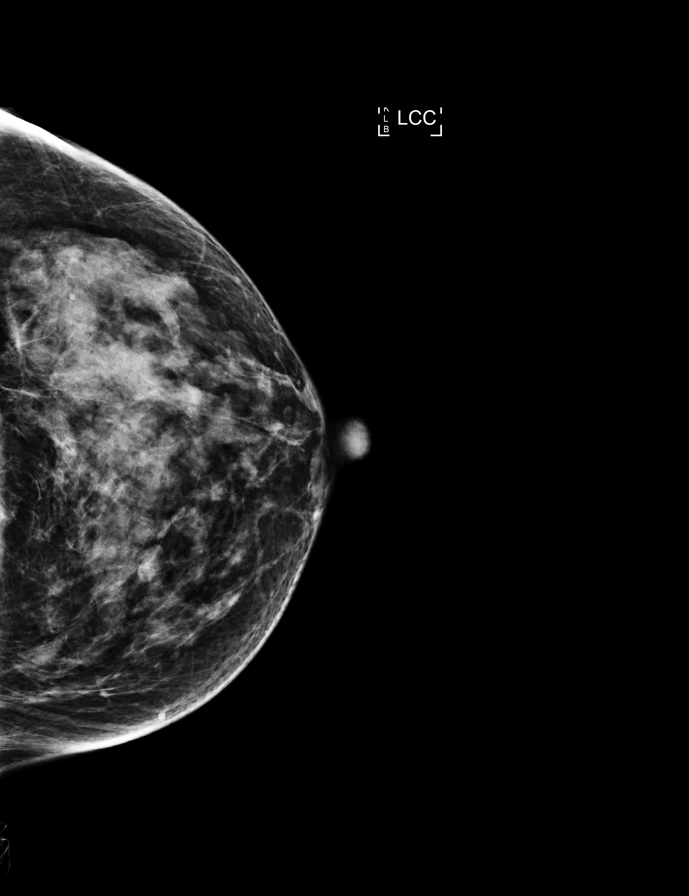

[L CC synth-2D]
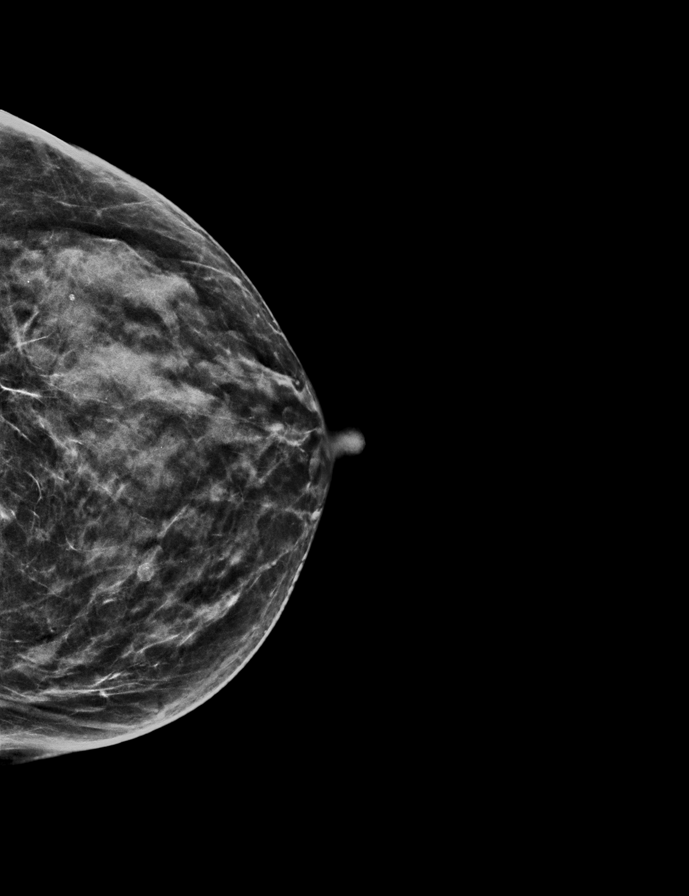

[R CC]
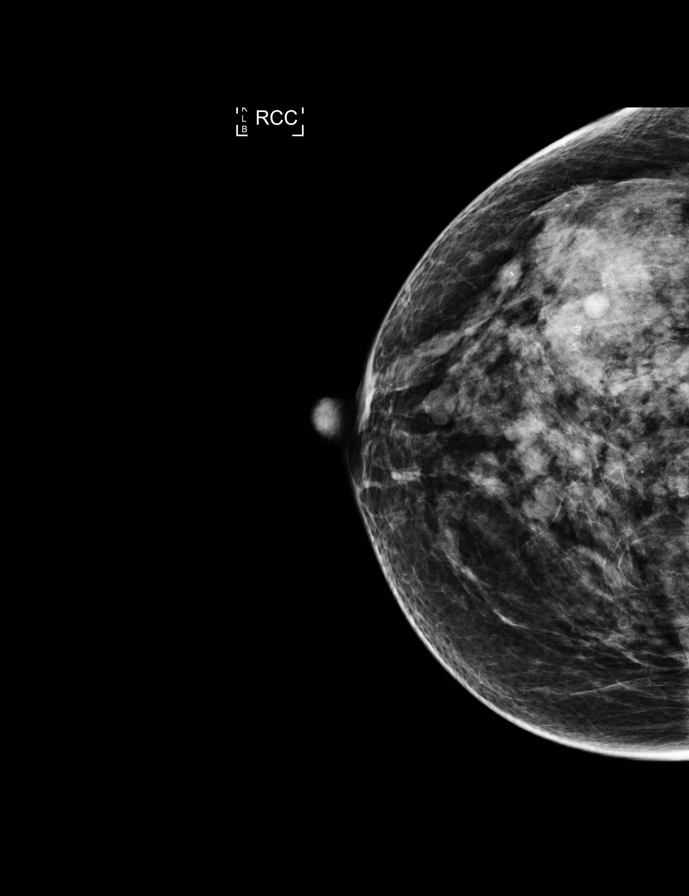

[L MLO synth-2D]
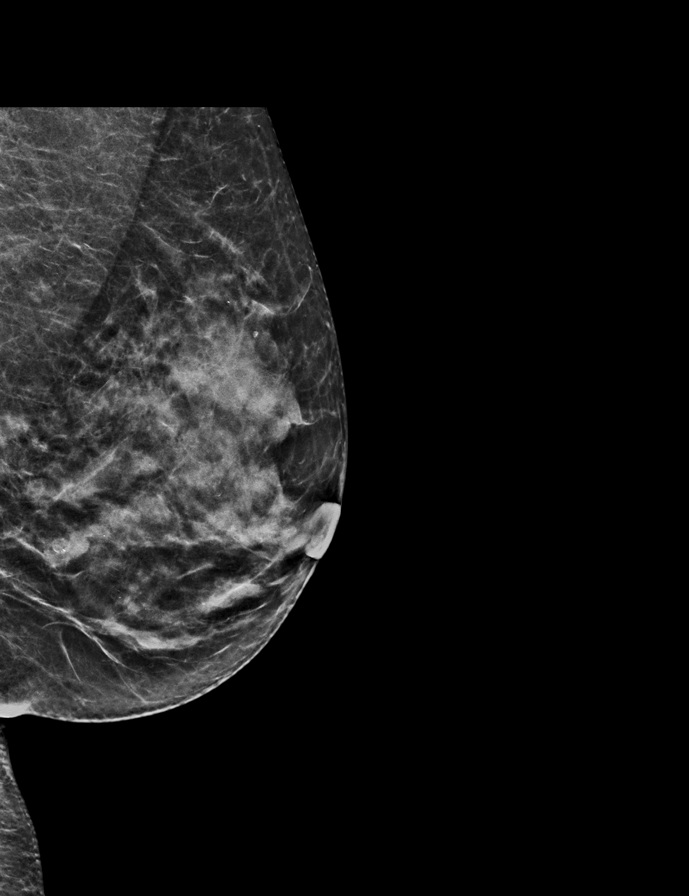

[L MLO tomo · tomo slice 25/50.0]
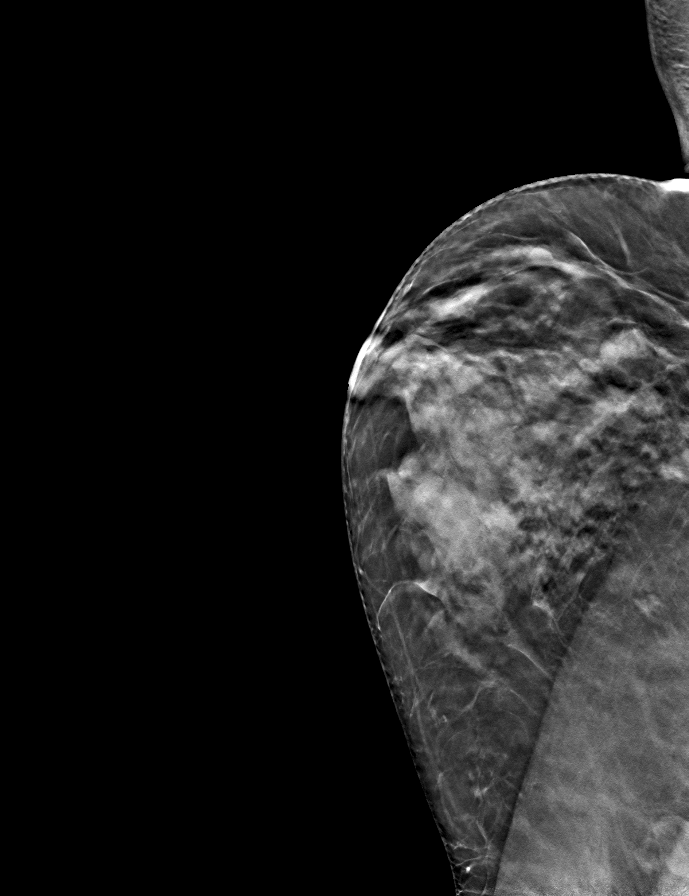

[9 of 28 positions shown; findings below may reference images not displayed]

ACR Breast Density Category d: The breast tissue is extremely dense,
which lowers the sensitivity of mammography.
FINDINGS: There are no findings suspicious for malignancy. Images were
processed with CAD.
IMPRESSION: No mammographic evidence of malignancy. A result letter of this
screening mammogram will be mailed directly to the patient.

RECOMMENDATION:
Screening mammogram in one year. (Code:[8S])

BI-RADS CATEGORY  1: Negative.

## 2014-04-24 ENCOUNTER — Encounter: Payer: Self-pay | Admitting: General Surgery

## 2014-04-24 ENCOUNTER — Ambulatory Visit (INDEPENDENT_AMBULATORY_CARE_PROVIDER_SITE_OTHER): Payer: 59 | Admitting: General Surgery

## 2014-04-24 VITALS — BP 96/62 | HR 72 | Resp 12 | Ht 61.0 in

## 2014-04-24 DIAGNOSIS — N6019 Diffuse cystic mastopathy of unspecified breast: Secondary | ICD-10-CM

## 2014-04-24 NOTE — Progress Notes (Signed)
Patient ID: Amanda Skinner, female   DOB: Jan 06, 1961, 54 y.o.   MRN: [id number]  Chief Complaint  Patient presents with  . Follow-up    mammogram    HPI Amanda Skinner is a 54 y.o. female.  who presents for a breast evaluation. The most recent mammogram was done on 04-16-14.  Patient does perform regular self breast checks and gets regular mammograms done.  No new breast issues.  HPI  Past Medical History  Diagnosis Date  . TMJ (dislocation of temporomandibular joint) 2012  . Asthma 2001  . Hypertension   . Ulcer   . Diffuse cystic mastopathy 2012    left    Past Surgical History  Procedure Laterality Date  . Rhinoplasty  1996  . Cesarean section  1991  . Tubal ligation  1991  . Tonsilloadenoidectomy   1978  . Lower extremity surgery   2001  . Foot surgery Left 10/2011  . Colonoscopy  June 2015    [name]    History reviewed. No pertinent family history.  Social History History  Substance Use Topics  . Smoking status: Never Smoker   . Smokeless tobacco: Never Used  . Alcohol Use: No    Allergies  Allergen Reactions  . Eggs Or Egg-Derived Products     Headache     Current Outpatient Prescriptions  Medication Sig Dispense Refill  . albuterol (PROVENTIL HFA;VENTOLIN HFA) 108 (90 BASE) MCG/ACT inhaler Inhale 2 puffs into the lungs every 6 (six) hours as needed for wheezing.    . calcium carbonate 200 MG capsule Take 250 mg by mouth daily.    . Estradiol (ESTRACE VA) Place vaginally.    Marland Kitchen estradiol (ESTRACE) 0.1 MG/GM vaginal cream Place 1 Applicatorful vaginally 2 (two) times a week.    Marland Kitchen VITAMIN D, CHOLECALCIFEROL, PO Take by mouth daily.     No current facility-administered medications for this visit.    Review of Systems Review of Systems  Constitutional: Negative.   Respiratory: Negative.   Cardiovascular: Negative.     Blood pressure 96/62, pulse 72, resp. rate 12, height 5\' 1"  (1.549 m).  Physical Exam Physical Exam  Constitutional:  She is oriented to person, place, and time. She appears well-developed and well-nourished.  Eyes: Conjunctivae are normal. No scleral icterus.  Neck: Neck supple.  Cardiovascular: Normal rate, regular rhythm and normal heart sounds.   Pulmonary/Chest: Effort normal and breath sounds normal. Right breast exhibits no inverted nipple, no mass, no nipple discharge, no skin change and no tenderness. Left breast exhibits no inverted nipple, no mass, no nipple discharge, no skin change and no tenderness.  Abdominal: Soft. Normal appearance and bowel sounds are normal. There is no hepatomegaly. There is no tenderness.  Lymphadenopathy:    She has no cervical adenopathy.    She has no axillary adenopathy.  Neurological: She is alert and oriented to person, place, and time.  Skin: Skin is warm and dry.    Data Reviewed Mammogram reviewed and stable.  Assessment    Stable physical exam. FCD with multiple cyst aspirations in past    Plan    The patient has been asked to return to the office in one year with a bilateral screening mammogram. Continue self breast exams. Call office for any new breast issues or concerns.       Liandro Thelin G 04/25/2014, 7:45 AM

## 2014-04-24 NOTE — Patient Instructions (Addendum)
Continue self breast exams. Call office for any new breast issues or concerns.The patient has been asked to return to the office in one year with a bilateral screening mammogram.

## 2014-04-25 ENCOUNTER — Encounter: Payer: Self-pay | Admitting: General Surgery

## 2014-05-01 ENCOUNTER — Encounter: Payer: Self-pay | Admitting: General Surgery

## 2014-05-30 ENCOUNTER — Encounter: Payer: Self-pay | Admitting: *Deleted

## 2014-10-08 ENCOUNTER — Encounter: Payer: Self-pay | Admitting: Family Medicine

## 2014-10-08 ENCOUNTER — Ambulatory Visit (INDEPENDENT_AMBULATORY_CARE_PROVIDER_SITE_OTHER): Payer: 59 | Admitting: Family Medicine

## 2014-10-08 VITALS — BP 102/62 | HR 77 | Temp 98.0°F | Ht 61.0 in | Wt 117.0 lb

## 2014-10-08 DIAGNOSIS — Z13220 Encounter for screening for lipoid disorders: Secondary | ICD-10-CM

## 2014-10-08 DIAGNOSIS — J309 Allergic rhinitis, unspecified: Secondary | ICD-10-CM | POA: Diagnosis not present

## 2014-10-08 DIAGNOSIS — M79641 Pain in right hand: Secondary | ICD-10-CM | POA: Diagnosis not present

## 2014-10-08 DIAGNOSIS — M79642 Pain in left hand: Secondary | ICD-10-CM

## 2014-10-08 DIAGNOSIS — J4520 Mild intermittent asthma, uncomplicated: Secondary | ICD-10-CM

## 2014-10-08 NOTE — Progress Notes (Signed)
Patient ID: [name], female   DOB: Oct 09, 1960, 54 y.o.   MRN: [id number]  Tommi Rumps, MD Phone: [phone]  Amanda Skinner is a 54 y.o. female who presents today for new patient visit.  Joint pain in hands: patient reports one week ago she had onset of aching pain in her bilateral hands. This was mostly in the MCP and PIP joints and also in her wrists. She notes she saw health at work and they treated her with doxycycline given her history of Lyme disease. She reports no rash with this. She notes mild joint swelling particularly in her right 1st MCP and PIP. She denies erythema of joints. She denies fevers. Notes she has lost 3 lbs over the past 2 months. No fever or fatigue. No other joint pain or swelling. Notes her mother had arthritis of her hands, though not RA. Has used voltaren and ibuprofen for this with some benefit. Notes this is improved and has only had 3 episodes of discomfort in various hand joints today.   Asthma: patient reports long history of asthma. Notes that she uses her albuterol inhaler once a month. States this for a tightness with breathing and the albuterol resolves this. States this can be brought on by anything. Always resolves with albuterol. No dyspnea, wheezing, or chest pain. Previously on advair, though has not used this in some time.   Allergic rhinitis: Notes long history of this. Notes she gets sinus congestion, ear fulness, post nasal drip, and sinus pressure. Notes mild sinus congestion at this time. No other symptoms at this time. Has been using intermittent flonase, zyrtec, and pseudephedrine.   Active Ambulatory Problems    Diagnosis Date Noted  . Diffuse cystic mastopathy 05/11/2012  . Bilateral hand pain 10/08/2014  . Asthma, chronic 10/09/2014  . Allergic rhinitis 10/09/2014   Resolved Ambulatory Problems    Diagnosis Date Noted  . No Resolved Ambulatory Problems   Past Medical History  Diagnosis Date  . TMJ (dislocation of  temporomandibular joint) 2012  . Asthma 2001  . Hypertension   . Ulcer     Family History  Problem Relation Age of Onset  . Heart attack Mother   . Heart disease Mother   . Heart disease Maternal Grandmother     Social History   Social History  . Marital Status: Married    Spouse Name: N/A  . Number of Children: N/A  . Years of Education: N/A   Occupational History  . Not on file.   Social History Main Topics  . Smoking status: Never Smoker   . Smokeless tobacco: Never Used  . Alcohol Use: No  . Drug Use: No  . Sexual Activity: Not on file   Other Topics Concern  . Not on file   Social History Narrative    ROS   General:  Negative for nexplained weight loss, fever Skin: Negative for new or changing mole, sore that won't heal HEENT: Negative for trouble hearing, trouble seeing, ringing in ears, mouth sores, hoarseness, change in voice, dysphagia. CV:  Negative for chest pain, dyspnea, edema, palpitations Resp: Negative for cough, dyspnea, hemoptysis GI: Negative for nausea, vomiting, diarrhea, constipation, abdominal pain, melena, hematochezia. GU: Negative for dysuria, incontinence, urinary hesitance, hematuria, vaginal or penile discharge, polyuria, sexual difficulty, lumps in testicle or breasts MSK: Positive for joint pain, Negative for muscle cramps or aches, joint swelling Neuro: Negative for headaches, weakness, numbness, dizziness, passing out/fainting Psych: Negative for depression, anxiety, memory problems  Objective  Physical Exam Filed Vitals:   10/08/14 1450  BP: 102/62  Pulse: 77  Temp: 98 F (36.7 C)    Physical Exam  Constitutional: She is well-developed, well-nourished, and in no distress.  HENT:  Head: Normocephalic and atraumatic.  Right Ear: External ear normal.  Left Ear: External ear normal.  Normal TM bilaterally, no OP erythema, no OP exudate  Eyes: Conjunctivae are normal. Pupils are equal, round, and reactive to light.    Neck: Neck supple.  Cardiovascular: Normal rate, regular rhythm and normal heart sounds.   Cap refill bilateral hands <2 seconds, 2+ radial pulses  Pulmonary/Chest: Effort normal and breath sounds normal.  Abdominal: Soft. Bowel sounds are normal. She exhibits no distension. There is no tenderness. There is no rebound and no guarding.  Musculoskeletal: She exhibits no edema.  Bilateral hands with no swelling, tenderness, or erythema, full hand ROM  Lymphadenopathy:    She has no cervical adenopathy.  Neurological: She is alert.  5/5 strength in bilateral biceps, triceps, grip, sensation to light touch intact in bilateral UE, normal gait, 2+ brachioradialis reflexes  Skin: Skin is warm and dry. She is not diaphoretic.     Assessment/Plan:   Bilateral hand pain Patient with bilateral hand discomfort that has improved over the past week. Differential includes OA, RA, SLE, or lyme disease. Patient recently treated as if she had lyme disease, though no confirmatory testing completed. Normal exam today. Discussed work up options including monitoring for continued improvement vs lab work for RA, SLE, and lyme disease vs imaging hands. Patient opted for continued monitoring at this time. Advised that if she has continued discomfort she should follow-up in the office for further evaluation and lab work. Given return precautions.   Asthma, chronic Stable at this time. Infrequently uses albuterol. Will continue to monitor and use as needed albuterol.   Allergic rhinitis Mild nasal congestion at this time. Normal exam at this time. Will continue flonase and zyrtec.     Orders Placed This Encounter  Procedures  . Lipid Profile    Standing Status: Future     Number of Occurrences:      Standing Expiration Date: 10/08/2015    Tommi Rumps

## 2014-10-08 NOTE — Progress Notes (Signed)
Pre visit review using our clinic review tool, if applicable. No additional management support is needed unless otherwise documented below in the visit note. 

## 2014-10-08 NOTE — Patient Instructions (Signed)
Nice to meet you. Please continue to monitor the joint aches in your hands. If these continue to bother you please let us know so we can work them up further.  Please schedule a lab appointment for cholesterol screening.

## 2014-10-08 NOTE — Assessment & Plan Note (Addendum)
Patient with bilateral hand discomfort that has improved over the past week. Differential includes OA, RA, SLE, or lyme disease. Patient recently treated as if she had lyme disease, though no confirmatory testing completed. Normal exam today. Discussed work up options including monitoring for continued improvement vs lab work for RA, SLE, and lyme disease vs imaging hands. Patient opted for continued monitoring at this time. Advised that if she has continued discomfort she should follow-up in the office for further evaluation and lab work. Given return precautions.

## 2014-10-09 ENCOUNTER — Encounter: Payer: Self-pay | Admitting: Family Medicine

## 2014-10-09 DIAGNOSIS — J309 Allergic rhinitis, unspecified: Secondary | ICD-10-CM | POA: Insufficient documentation

## 2014-10-09 DIAGNOSIS — J4520 Mild intermittent asthma, uncomplicated: Secondary | ICD-10-CM | POA: Insufficient documentation

## 2014-10-09 NOTE — Assessment & Plan Note (Signed)
Mild nasal congestion at this time. Normal exam at this time. Will continue flonase and zyrtec.

## 2014-10-09 NOTE — Assessment & Plan Note (Signed)
Stable at this time. Infrequently uses albuterol. Will continue to monitor and use as needed albuterol.

## 2014-12-11 ENCOUNTER — Telehealth: Payer: Self-pay | Admitting: Physician Assistant

## 2014-12-11 DIAGNOSIS — J3089 Other allergic rhinitis: Secondary | ICD-10-CM

## 2014-12-11 MED ORDER — FLUTICASONE PROPIONATE 50 MCG/ACT NA SUSP: 2.0000 | NASAL | Status: DC | PRN

## 2014-12-11 NOTE — Telephone Encounter (Signed)
Med refill request

## 2015-01-21 ENCOUNTER — Inpatient Hospital Stay
Admission: EM | Admit: 2015-01-21 | Discharge: 2015-01-22 | DRG: 311 | Disposition: A | Discharge status: Home / Self Care | Payer: 59 | Attending: Internal Medicine | Admitting: Internal Medicine

## 2015-01-21 ENCOUNTER — Emergency Department: Payer: 59

## 2015-01-21 ENCOUNTER — Ambulatory Visit: Payer: Self-pay | Admitting: Physician Assistant

## 2015-01-21 ENCOUNTER — Encounter: Payer: Self-pay | Admitting: Emergency Medicine

## 2015-01-21 ENCOUNTER — Encounter: Payer: Self-pay | Admitting: Physician Assistant

## 2015-01-21 VITALS — BP 120/74 | HR 76 | Temp 97.7°F

## 2015-01-21 DIAGNOSIS — Z9851 Tubal ligation status: Secondary | ICD-10-CM

## 2015-01-21 DIAGNOSIS — F1990 Other psychoactive substance use, unspecified, uncomplicated: Secondary | ICD-10-CM

## 2015-01-21 DIAGNOSIS — Z8249 Family history of ischemic heart disease and other diseases of the circulatory system: Secondary | ICD-10-CM

## 2015-01-21 DIAGNOSIS — Z888 Allergy status to other drugs, medicaments and biological substances status: Secondary | ICD-10-CM

## 2015-01-21 DIAGNOSIS — Z9889 Other specified postprocedural states: Secondary | ICD-10-CM | POA: Diagnosis not present

## 2015-01-21 DIAGNOSIS — J4520 Mild intermittent asthma, uncomplicated: Secondary | ICD-10-CM | POA: Diagnosis present

## 2015-01-21 DIAGNOSIS — Z79899 Other long term (current) drug therapy: Secondary | ICD-10-CM | POA: Diagnosis not present

## 2015-01-21 DIAGNOSIS — Z7951 Long term (current) use of inhaled steroids: Secondary | ICD-10-CM

## 2015-01-21 DIAGNOSIS — I200 Unstable angina: Secondary | ICD-10-CM | POA: Diagnosis not present

## 2015-01-21 DIAGNOSIS — I10 Essential (primary) hypertension: Secondary | ICD-10-CM | POA: Diagnosis present

## 2015-01-21 DIAGNOSIS — R079 Chest pain, unspecified: Secondary | ICD-10-CM

## 2015-01-21 LAB — PROTIME-INR
INR: 0.96
Prothrombin Time: 13 seconds (ref 11.4–15.0)

## 2015-01-21 LAB — BASIC METABOLIC PANEL
ANION GAP: 7 (ref 5–15)
BUN: 14 mg/dL (ref 6–20)
CALCIUM: 9.2 mg/dL (ref 8.9–10.3)
CO2: 29 mmol/L (ref 22–32)
Chloride: 104 mmol/L (ref 101–111)
Creatinine, Ser: 0.79 mg/dL (ref 0.44–1.00)
Glucose, Bld: 98 mg/dL (ref 65–99)
Potassium: 3.5 mmol/L (ref 3.5–5.1)
SODIUM: 140 mmol/L (ref 135–145)

## 2015-01-21 LAB — CBC
HCT: 41 % (ref 35.0–47.0)
Hemoglobin: 14 g/dL (ref 12.0–16.0)
MCH: 31 pg (ref 26.0–34.0)
MCHC: 34.1 g/dL (ref 32.0–36.0)
MCV: 90.8 fL (ref 80.0–100.0)
PLATELETS: 223 10*3/uL (ref 150–440)
RBC: 4.51 MIL/uL (ref 3.80–5.20)
RDW: 12.5 % (ref 11.5–14.5)
WBC: 6.6 10*3/uL (ref 3.6–11.0)

## 2015-01-21 LAB — TROPONIN I

## 2015-01-21 LAB — APTT: APTT: 30 s (ref 24–36)

## 2015-01-21 IMAGING — CR DG CHEST 2V
2 series · 2 of 2 positions shown · non-contrast
Comparison: [DATE]

CLINICAL DATA: Chest pain.

EXAM:
CHEST  2 VIEW

[chest pa]
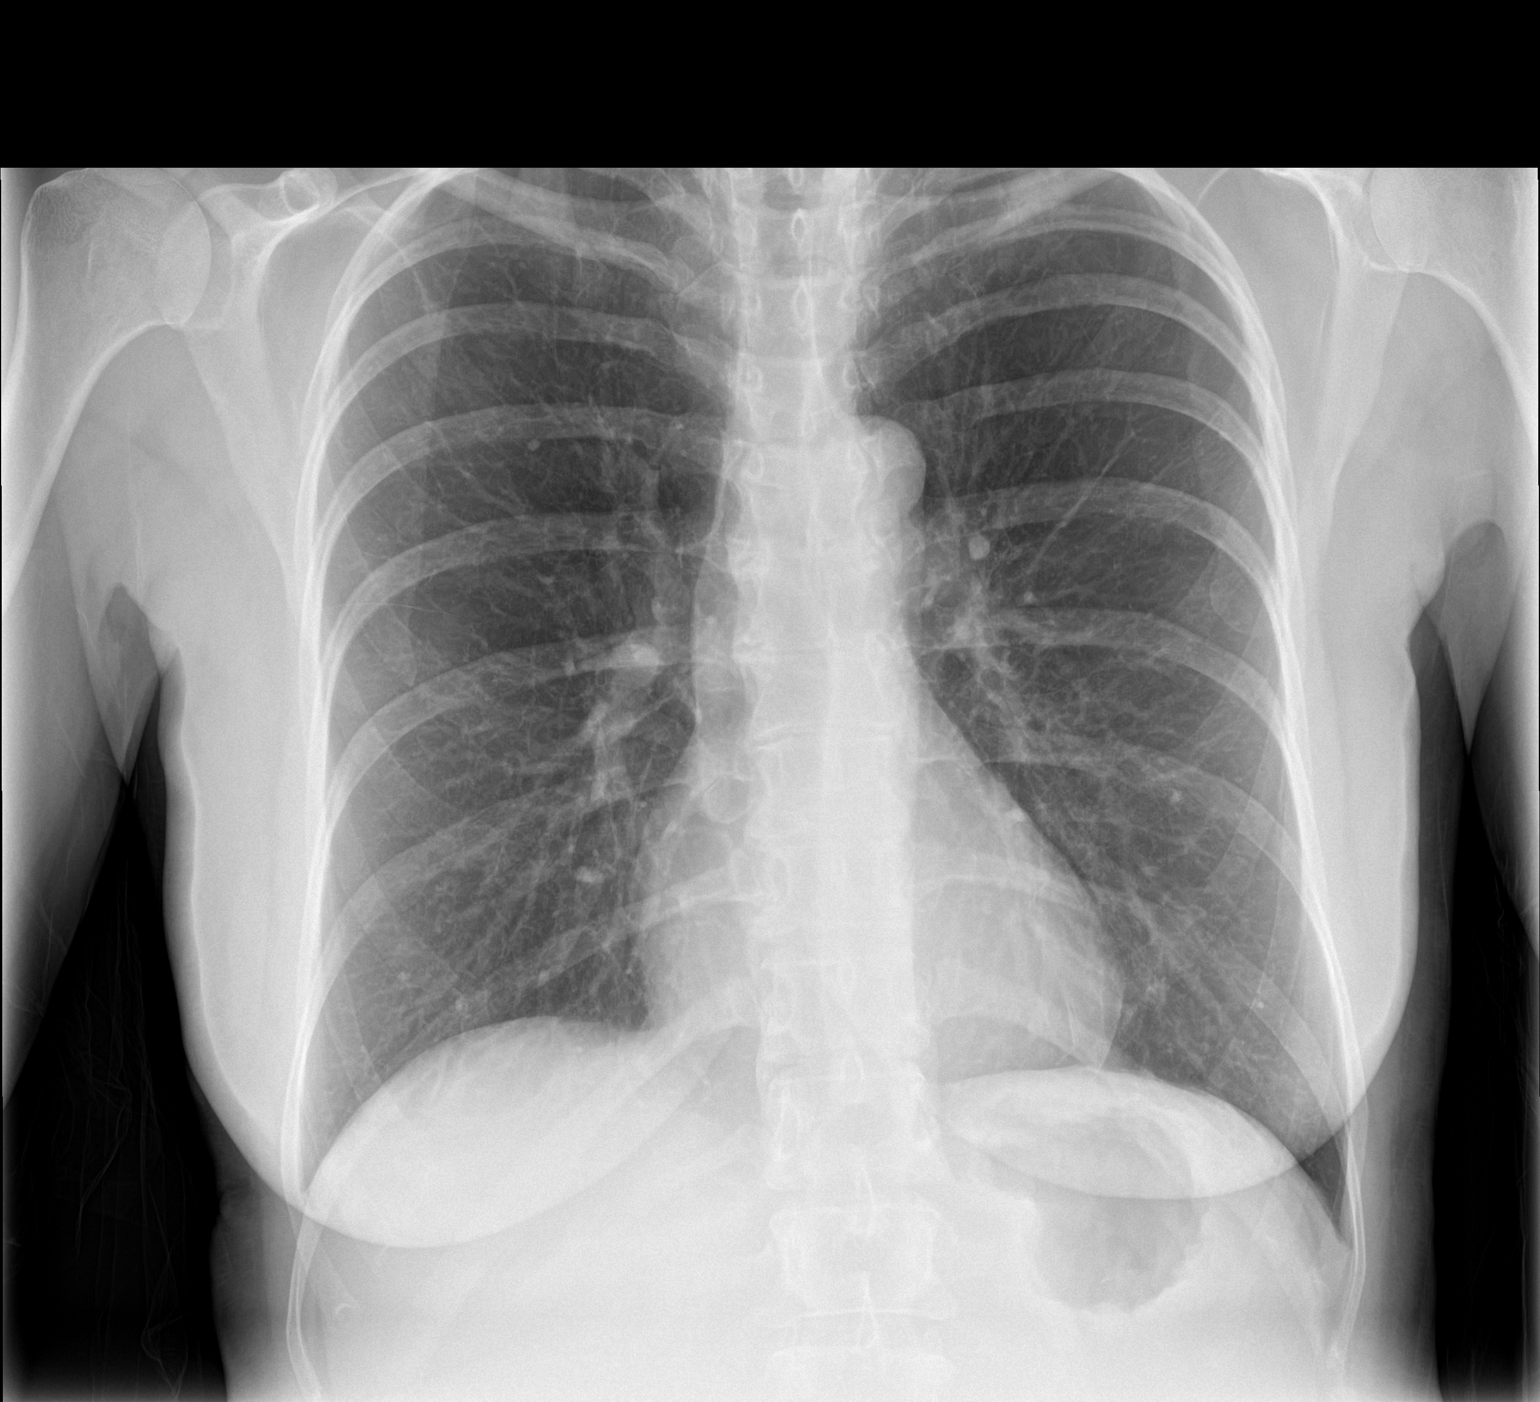

[chest lat]
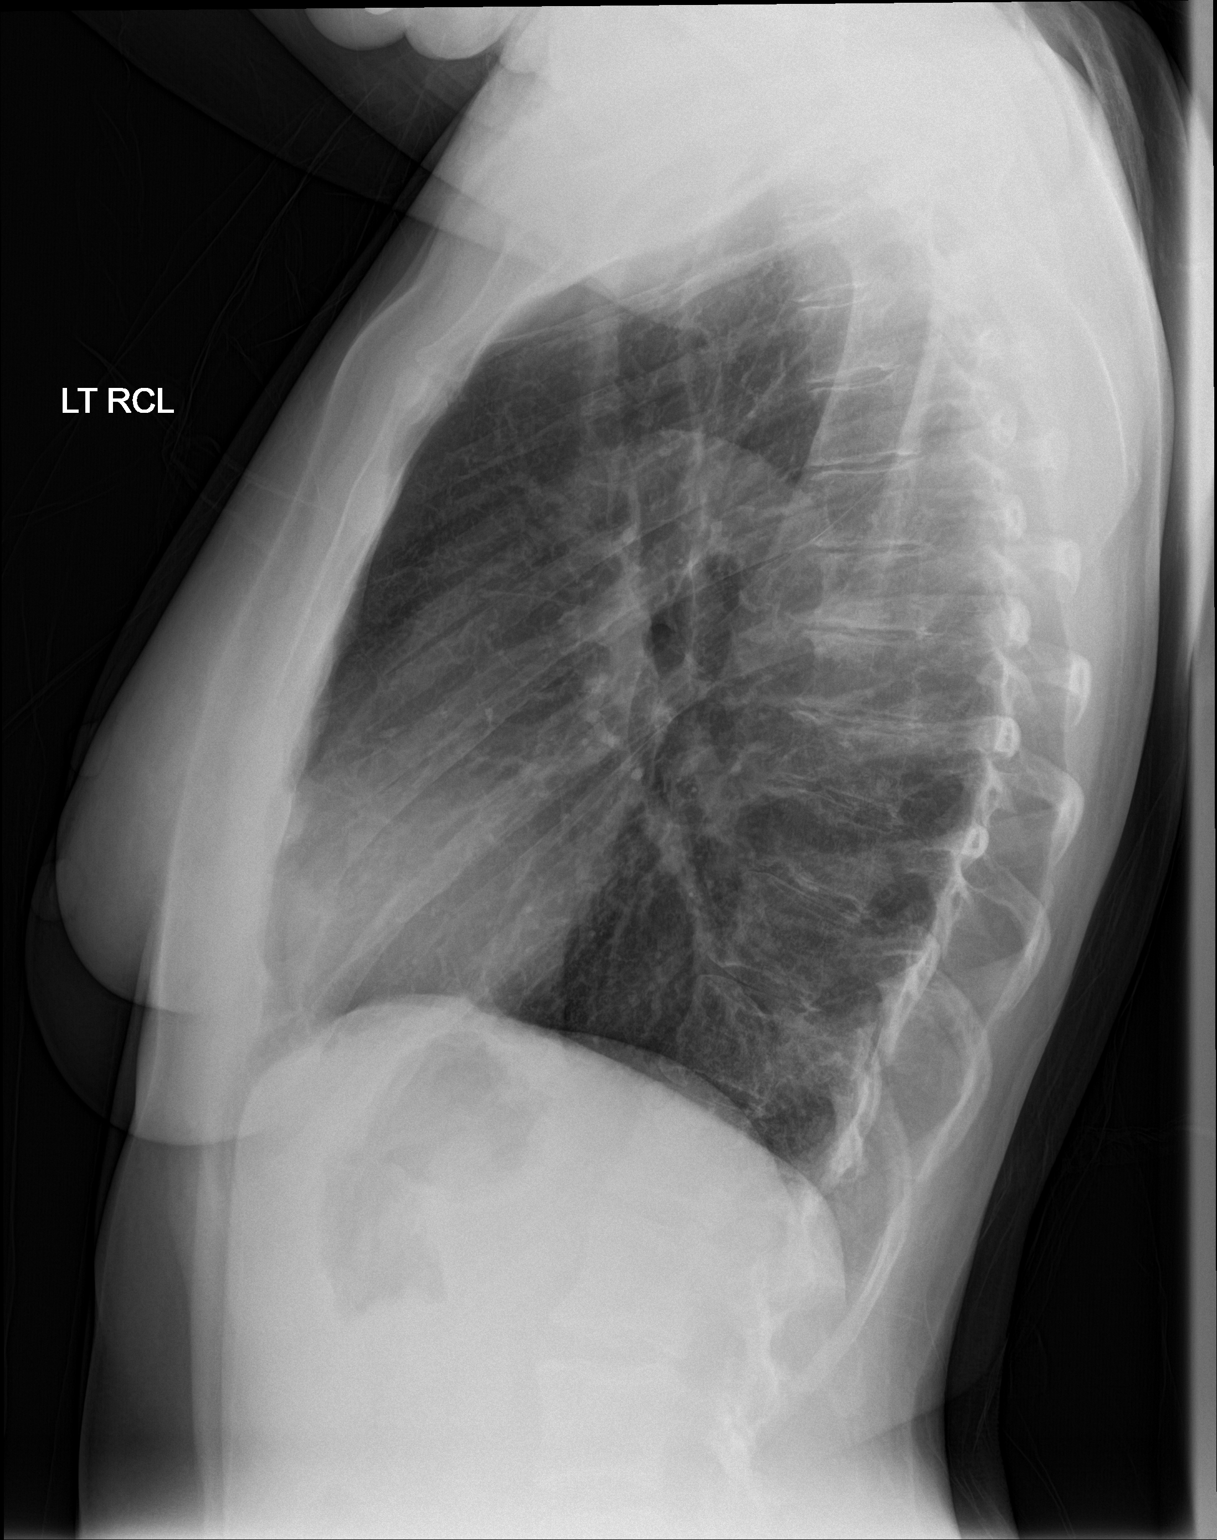

[2 of 2 positions shown; findings below may reference images not displayed]

FINDINGS: Normal heart size and mediastinal contours. No acute infiltrate or
edema. No effusion or pneumothorax. No acute osseous findings.
IMPRESSION: No active cardiopulmonary disease.

## 2015-01-21 MED ORDER — ONDANSETRON HCL 4 MG/2ML IJ SOLN
4.0000 mg | Freq: Once | INTRAMUSCULAR | Status: AC
  Administered 2015-01-21: 4 mg via INTRAVENOUS

## 2015-01-21 MED ORDER — CHOLECALCIFEROL 10 MCG (400 UNIT) PO TABS
800.0000 [IU] | ORAL_TABLET | Freq: Every day | ORAL | Status: DC
  Filled 2015-01-21: qty 2

## 2015-01-21 MED ORDER — FLUTICASONE PROPIONATE 50 MCG/ACT NA SUSP
2.0000 | NASAL | Status: DC | PRN
  Filled 2015-01-21: qty 16

## 2015-01-21 MED ORDER — SODIUM CHLORIDE 0.9 % IJ SOLN
3.0000 mL | Freq: Two times a day (BID) | INTRAMUSCULAR | Status: DC
  Administered 2015-01-22: 3 mL via INTRAVENOUS

## 2015-01-21 MED ORDER — NITROGLYCERIN 0.4 MG SL SUBL
0.4000 mg | SUBLINGUAL_TABLET | SUBLINGUAL | Status: DC | PRN
  Administered 2015-01-21 (×3): 0.4 mg via SUBLINGUAL

## 2015-01-21 MED ORDER — GI COCKTAIL ~~LOC~~
30.0000 mL | Freq: Once | ORAL | Status: AC
  Administered 2015-01-21: 30 mL via ORAL

## 2015-01-21 MED ORDER — ASPIRIN EC 325 MG PO TBEC
325.0000 mg | DELAYED_RELEASE_TABLET | Freq: Every day | ORAL | Status: DC
  Administered 2015-01-22: 325 mg via ORAL
  Filled 2015-01-21: qty 1

## 2015-01-21 MED ORDER — ESTRADIOL 0.1 MG/GM VA CREA: 1.0000 | TOPICAL_CREAM | VAGINAL | Status: DC

## 2015-01-21 MED ORDER — ACETAMINOPHEN 650 MG RE SUPP: 650.0000 mg | Freq: Four times a day (QID) | RECTAL | Status: DC | PRN

## 2015-01-21 MED ORDER — ONDANSETRON HCL 4 MG/2ML IJ SOLN
INTRAMUSCULAR | Status: AC
  Administered 2015-01-21: 4 mg via INTRAVENOUS
  Filled 2015-01-21: qty 2

## 2015-01-21 MED ORDER — ASPIRIN 81 MG PO CHEW
324.0000 mg | CHEWABLE_TABLET | Freq: Once | ORAL | Status: AC
  Administered 2015-01-21: 324 mg via ORAL
  Filled 2015-01-21: qty 4

## 2015-01-21 MED ORDER — MORPHINE SULFATE (PF) 2 MG/ML IV SOLN
1.0000 mg | INTRAVENOUS | Status: DC | PRN
Start: 2015-01-21 — End: 2015-01-22

## 2015-01-21 MED ORDER — MORPHINE SULFATE (PF) 4 MG/ML IV SOLN
INTRAVENOUS | Status: AC
  Administered 2015-01-21: 4 mg via INTRAVENOUS
  Filled 2015-01-21: qty 1

## 2015-01-21 MED ORDER — HEPARIN BOLUS VIA INFUSION
3150.0000 [IU] | Freq: Once | INTRAVENOUS | Status: AC
  Administered 2015-01-21: 3150 [IU] via INTRAVENOUS
  Filled 2015-01-21: qty 3150

## 2015-01-21 MED ORDER — ASPIRIN 81 MG PO CHEW
CHEWABLE_TABLET | ORAL | Status: AC
  Filled 2015-01-21: qty 1

## 2015-01-21 MED ORDER — HEPARIN (PORCINE) IN NACL 100-0.45 UNIT/ML-% IJ SOLN
650.0000 [IU]/h | INTRAMUSCULAR | Status: DC
  Administered 2015-01-21: 650 [IU]/h via INTRAVENOUS
  Filled 2015-01-21: qty 250

## 2015-01-21 MED ORDER — ALBUTEROL SULFATE (2.5 MG/3ML) 0.083% IN NEBU: 2.5000 mg | INHALATION_SOLUTION | Freq: Four times a day (QID) | RESPIRATORY_TRACT | Status: DC | PRN

## 2015-01-21 MED ORDER — SENNOSIDES-DOCUSATE SODIUM 8.6-50 MG PO TABS: 1.0000 | ORAL_TABLET | Freq: Every evening | ORAL | Status: DC | PRN

## 2015-01-21 MED ORDER — ALBUTEROL SULFATE HFA 108 (90 BASE) MCG/ACT IN AERS: 2.0000 | INHALATION_SPRAY | Freq: Four times a day (QID) | RESPIRATORY_TRACT | Status: DC | PRN

## 2015-01-21 MED ORDER — ALUM & MAG HYDROXIDE-SIMETH 200-200-20 MG/5ML PO SUSP: 30.0000 mL | Freq: Four times a day (QID) | ORAL | Status: DC | PRN

## 2015-01-21 MED ORDER — GI COCKTAIL ~~LOC~~
ORAL | Status: AC
  Filled 2015-01-21: qty 30

## 2015-01-21 MED ORDER — ONDANSETRON HCL 4 MG PO TABS: 4.0000 mg | ORAL_TABLET | Freq: Four times a day (QID) | ORAL | Status: DC | PRN

## 2015-01-21 MED ORDER — ONDANSETRON HCL 4 MG/2ML IJ SOLN
4.0000 mg | Freq: Four times a day (QID) | INTRAMUSCULAR | Status: DC | PRN
  Administered 2015-01-22: 4 mg via INTRAVENOUS
  Filled 2015-01-21: qty 2

## 2015-01-21 MED ORDER — HYDROCODONE-ACETAMINOPHEN 5-325 MG PO TABS
1.0000 | ORAL_TABLET | ORAL | Status: DC | PRN
  Administered 2015-01-21: 2 via ORAL
  Filled 2015-01-21: qty 1
  Filled 2015-01-21: qty 2

## 2015-01-21 MED ORDER — ACETAMINOPHEN 325 MG PO TABS
650.0000 mg | ORAL_TABLET | Freq: Four times a day (QID) | ORAL | Status: DC | PRN
  Administered 2015-01-22: 650 mg via ORAL
  Filled 2015-01-21: qty 2

## 2015-01-21 MED ORDER — NITROGLYCERIN 0.4 MG SL SUBL: 0.4000 mg | SUBLINGUAL_TABLET | SUBLINGUAL | Status: DC | PRN

## 2015-01-21 MED ORDER — MORPHINE SULFATE (PF) 4 MG/ML IV SOLN
4.0000 mg | Freq: Once | INTRAVENOUS | Status: AC
  Administered 2015-01-21: 4 mg via INTRAVENOUS

## 2015-01-21 NOTE — Progress Notes (Signed)
S: c/o left sided chest pain for 2 days, gets worse with activity, radiates to back, no left arm pain, neck pain, or diaphoresis, strong fam hx of heart disease,   O: vitals wnl, pt tearful, lungs c t a, cv rrr  A: acute chest pain  P: sent patient to the ER, states she is going but needs to stop by her department first, told her to clock out here and go directly to the ER

## 2015-01-21 NOTE — ED Notes (Signed)
Patient transported to X-ray 

## 2015-01-21 NOTE — H&P (Signed)
[place] [facility] [place] NAME: Amanda Skinner    MR#:  [id number]  DATE OF BIRTH:  1961/01/06  DATE OF ADMISSION:  01/21/2015  PRIMARY CARE PHYSICIAN: Tommi Rumps, MD   REQUESTING/REFERRING PHYSICIAN: Dr Karma Greaser  CHIEF COMPLAINT:  Chest pain  HISTORY OF PRESENT ILLNESS:  Amanda Skinner  is a 54 y.o. female with a known history of Asthma who presents with above complaint. The patient reports over the past few weeks she's had twinges of chest pain. However over the past week  She has had  increasing episodes of chest pain. Her chest pain started Saturday when she was decorating her house. Since that time she is at increasing episodes of chest pain. In the emergency room she had chest pain with lightheadedness and dizziness. This manages her chest pain she had no other symptoms and it did not radiate. She denies any aggravating or relieving factors. She currently has chest pain. She was given aspirin and one dose of Kytril and the chest pain is subsiding. I also gave her a GI cocktail. She is consented for heparin drip.Marland Kitchen  PAST MEDICAL HISTORY:   Past Medical History  Diagnosis Date  . TMJ (dislocation of temporomandibular joint) 2012  . Asthma 2001  . Hypertension   . Ulcer   . Diffuse cystic mastopathy 2012    left    PAST SURGICAL HISTORY:   Past Surgical History  Procedure Laterality Date  . Rhinoplasty  1996  . Cesarean section  1991  . Tubal ligation  1991  . Tonsilloadenoidectomy   1978  . Lower extremity surgery   2001  . Foot surgery Left 10/2011  . Colonoscopy  June 2015    [name]    SOCIAL HISTORY:   Social History  Substance Use Topics  . Smoking status: Never Smoker   . Smokeless tobacco: Never Used  . Alcohol Use: No    FAMILY HISTORY:   Family History  Problem Relation Age of Onset  . Heart attack Mother   . Heart disease Mother   . Heart disease Maternal Grandmother     DRUG ALLERGIES:    Allergies  Allergen Reactions  . Etodolac Other (See Comments)    Reaction:  Migraines      REVIEW OF SYSTEMS:  CONSTITUTIONAL: No fever, fatigue or weakness.  EYES: No blurred or double vision.  EARS, NOSE, AND THROAT: No tinnitus or ear pain.  RESPIRATORY: No cough, shortness of breath, wheezing or hemoptysis.  CARDIOVASCULAR: ++ chest pain, NO orthopnea, edema.  GASTROINTESTINAL: No nausea, vomiting, diarrhea or abdominal pain.  GENITOURINARY: No dysuria, hematuria.  ENDOCRINE: No polyuria, nocturia,  HEMATOLOGY: No anemia, easy bruising or bleeding SKIN: No rash or lesion. MUSCULOSKELETAL: No joint pain or arthritis.   NEUROLOGIC: No tingling, numbness, weakness.  PSYCHIATRY: No anxiety or depression.   MEDICATIONS AT HOME:   Prior to Admission medications   Medication Sig Start Date End Date Taking? Authorizing Provider  albuterol (PROVENTIL HFA;VENTOLIN HFA) 108 (90 BASE) MCG/ACT inhaler Inhale 2 puffs into the lungs every 6 (six) hours as needed for wheezing or shortness of breath.    Yes Historical Provider, MD  Cholecalciferol (VITAMIN D3) 400 UNITS CHEW Chew 800 Units by mouth daily.   Yes Historical Provider, MD  estradiol (ESTRACE) 0.1 MG/GM vaginal cream Place 1 Applicatorful vaginally 2 (two) times a week.   Yes Historical Provider, MD  fluticasone (FLONASE) 50 MCG/ACT nasal spray Place 2 sprays into both  nostrils as needed for allergies or rhinitis. 12/11/14  Yes Versie Starks, PA-C      VITAL SIGNS:  Blood pressure 129/63, pulse 67, temperature 97.7 F (36.5 C), temperature source Oral, resp. rate 16, height 5' (1.524 m), weight 53.071 kg (117 lb), SpO2 100 %.  PHYSICAL EXAMINATION:  GENERAL:  54 y.o.-year-old patient lying in the bed with no acute distress. Anxious EYES: Pupils equal, round, reactive to light and accommodation. No scleral icterus. Extraocular muscles intact.  HEENT: Head atraumatic, normocephalic. Oropharynx and nasopharynx clear.  NECK:   Supple, no jugular venous distention. No thyroid enlargement, no tenderness.  LUNGS: Normal breath sounds bilaterally, no wheezing, rales,rhonchi or crepitation. No use of accessory muscles of respiration.  CARDIOVASCULAR: S1, S2 normal. No murmurs, rubs, or gallops. No chest pain on palpation ABDOMEN: Soft, nontender, nondistended. Bowel sounds present. No organomegaly or mass.  EXTREMITIES: No pedal edema, cyanosis, or clubbing.  NEUROLOGIC: Cranial nerves II through XII are grossly intact. No focal deficits. PSYCHIATRIC: The patient is alert and oriented x 3.  SKIN: No obvious rash, lesion, or ulcer.   LABORATORY PANEL:   CBC  Recent Labs Lab 01/21/15 1558  WBC 6.6  HGB 14.0  HCT 41.0  PLT 223   ------------------------------------------------------------------------------------------------------------------  Chemistries   Recent Labs Lab 01/21/15 1558  NA 140  K 3.5  CL 104  CO2 29  GLUCOSE 98  BUN 14  CREATININE 0.79  CALCIUM 9.2   ------------------------------------------------------------------------------------------------------------------  Cardiac Enzymes  Recent Labs Lab 01/21/15 1558  TROPONINI <0.03   ------------------------------------------------------------------------------------------------------------------  RADIOLOGY:  Dg Chest 2 View  01/21/2015  CLINICAL DATA:  Chest pain. EXAM: CHEST  2 VIEW COMPARISON:  11/27/2010 FINDINGS: Normal heart size and mediastinal contours. No acute infiltrate or edema. No effusion or pneumothorax. No acute osseous findings. IMPRESSION: No active cardiopulmonary disease. Electronically Signed   By: Monte Fantasia M.D.   On: 01/21/2015 16:30    EKG:   Normal sinus rhythm and no ST elevation or depression. T-wave inversions in the anterior leads  IMPRESSION AND PLAN:   54 year old female with a history of Asthma  who presents with chest pain.  1. Unstable angina: Patient will be admitted to the  hospitalist service. Patient has consented for heparin drip. I have reviewed side effects, alternatives, risks and benefits. Patient accepts these risks not limited to bleeding. Patient is on aspirin and I will check lipid panel. Patient will need to rule out for acute coronary syndrome with troponins. If these are negative patient will undergo cardiac stress test and a urine. If they become positive she will need cardiac catheterization.  2. Asthma: This is mild and intermittent. She is no acute exacerbation.    All the records are reviewed and case discussed with ED provider. Management plans discussed with the patient and she is in agreement.  CODE STATUS: FULL  TOTAL TIME TAKING CARE OF THIS PATIENT: 45 minutes.    Gean Larose M.D on 01/21/2015 at 5:13 PM  Between 7am to 6pm - Pager - [phone] After 6pm go to [email or web address] - password [email or web address] [facility]  [facility]  Office  [phone]  CC: Primary care physician; Tommi Rumps, MD

## 2015-01-21 NOTE — Consult Note (Signed)
ANTICOAGULATION CONSULT NOTE - Initial Consult  Pharmacy Consult for heparin Indication: chest pain/ACS / unstable angina  Allergies  Allergen Reactions  . Etodolac Other (See Comments)    Reaction:  Migraines     Patient Measurements: Height: 5' (152.4 cm) Weight: 117 lb (53.071 kg) IBW/kg (Calculated) : 45.5 Heparin Dosing Weight: 53.1kg  Vital Signs: Temp: 97.7 F (36.5 C) (11/28 1557) Temp Source: Oral (11/28 1557) BP: 129/63 mmHg (11/28 1657) Pulse Rate: 67 (11/28 1657)  Labs:  Recent Labs  01/21/15 1558  HGB 14.0  HCT 41.0  PLT 223  APTT 30  LABPROT 13.0  INR 0.96  CREATININE 0.79  TROPONINI <0.03    Estimated Creatinine Clearance: 57.1 mL/min (by C-G formula based on Cr of 0.79).   Medical History: Past Medical History  Diagnosis Date  . TMJ (dislocation of temporomandibular joint) 2012  . Asthma 2001  . Hypertension   . Ulcer   . Diffuse cystic mastopathy 2012    left    Medications:  Scheduled:    Assessment: Pt is a 54 year old female with unstable angina. Pharmacy consulted to dose a heparin drip.   Goal of Therapy:  Heparin level 0.3-0.7 units/ml Monitor platelets by anticoagulation protocol: Yes   Plan:  Give 3150 units bolus x 1 Start heparin infusion at 650 units/hr Check anti-Xa level in 6 hours and daily while on heparin Continue to monitor H&H and platelets  Amanda Skinner D Amanda Skinner 01/21/2015,5:34 PM

## 2015-01-21 NOTE — ED Notes (Addendum)
States she had some chest pain over the weekend. Then again about 1130 a. And has been constant. Describes as throbbing and tightness while in triage became dizzy

## 2015-01-21 NOTE — ED Provider Notes (Signed)
[facility] Emergency Department Provider Note  ____________________________________________  Time seen: Approximately 4:17 PM  I have reviewed the triage vital signs and the nursing notes.   HISTORY  Chief Complaint Chest Pain    HPI Amanda Skinner is a 54 y.o. female with past medical history of hypertension and a strong family history of ACS/CAD in both her mother and her grandmother.  She presents with several days of episodic chest pain that occurs at rest and with minimal exertion.  She describes it as a squeezing pressure and occasional sharp stabbing feeling in the central to left side of her chest.  It is accompanied with some shortness of breath.  It lasts for 30 minutes or more and then resolves, but today it started several hours prior to arrival and has been constant.  While she was waiting for room in the emergency department she became lightheaded and dizzy.  She has had no nausea or vomiting, no abdominal pain, no dysuria, no fever/chills.  She does use estradiol cream but does not take any exogenous estrogen systemically.She did go on a cruise less than a month ago but has no history of cancer and has had no leg pain or swelling.  Nothing seems to make the pain better and nothing makes it worse and she describes the symptoms as severe.   Past Medical History  Diagnosis Date  . TMJ (dislocation of temporomandibular joint) 2012  . Asthma 2001  . Hypertension   . Ulcer   . Diffuse cystic mastopathy 2012    left    Patient Active Problem List   Diagnosis Date Noted  . Asthma, chronic 10/09/2014  . Allergic rhinitis 10/09/2014  . Bilateral hand pain 10/08/2014  . Diffuse cystic mastopathy 05/11/2012    Past Surgical History  Procedure Laterality Date  . Rhinoplasty  1996  . Cesarean section  1991  . Tubal ligation  1991  . Tonsilloadenoidectomy   1978  . Lower extremity surgery   2001  . Foot surgery Left 10/2011  . Colonoscopy   June 2015    [name]    Current Outpatient Rx  Name  Route  Sig  Dispense  Refill  . albuterol (PROVENTIL HFA;VENTOLIN HFA) 108 (90 BASE) MCG/ACT inhaler   Inhalation   Inhale 2 puffs into the lungs every 6 (six) hours as needed for wheezing or shortness of breath.          . Cholecalciferol (VITAMIN D3) 400 UNITS CHEW   Oral   Chew 800 Units by mouth daily.         Marland Kitchen estradiol (ESTRACE) 0.1 MG/GM vaginal cream   Vaginal   Place 1 Applicatorful vaginally 2 (two) times a week.         . fluticasone (FLONASE) 50 MCG/ACT nasal spray   Each Nare   Place 2 sprays into both nostrils as needed for allergies or rhinitis.   16 g   6     Allergies Etodolac  Family History  Problem Relation Age of Onset  . Heart attack Mother   . Heart disease Mother   . Heart disease Maternal Grandmother     Social History Social History  Substance Use Topics  . Smoking status: Never Smoker   . Smokeless tobacco: Never Used  . Alcohol Use: No    Review of Systems Constitutional: No fever/chills Eyes: No visual changes. ENT: No sore throat. Cardiovascular: Severe central chest pain Respiratory: Mild shortness of breath that accompanies the  chest pain Gastrointestinal: No abdominal pain.  No nausea, no vomiting.  No diarrhea.  No constipation. Genitourinary: Negative for dysuria. Musculoskeletal: Negative for back pain. Skin: Negative for rash. Neurological: Negative for headaches, focal weakness or numbness.  10-point ROS otherwise negative.  ____________________________________________   PHYSICAL EXAM:  VITAL SIGNS: ED Triage Vitals  Enc Vitals Group     BP 01/21/15 1557 148/82 mmHg     Pulse Rate 01/21/15 1557 68     Resp 01/21/15 1557 18     Temp 01/21/15 1557 97.7 F (36.5 C)     Temp Source 01/21/15 1557 Oral     SpO2 01/21/15 1557 99 %     Weight 01/21/15 1557 117 lb (53.071 kg)     Height 01/21/15 1557 5' (1.524 m)     Head Cir --      Peak Flow --       Pain Score 01/21/15 1551 3     Pain Loc --      Pain Edu? --      Excl. in [place]? --     Constitutional: Alert and oriented.  Healthy body habitus.  Generally well appearing but tearful and in mild distress. Eyes: Conjunctivae are normal. PERRL. EOMI. Head: Atraumatic. Nose: No congestion/rhinnorhea. Mouth/Throat: Mucous membranes are moist.  Oropharynx non-erythematous. Neck: No stridor.   Cardiovascular: Normal rate, regular rhythm. Grossly normal heart sounds.  Good peripheral circulation.  No reproducible chest wall tenderness Respiratory: Normal respiratory effort.  No retractions. Lungs CTAB. Gastrointestinal: Soft and nontender. No distention. No abdominal bruits. No CVA tenderness. Musculoskeletal: No lower extremity tenderness nor edema.  No joint effusions. Neurologic:  Normal speech and language. No gross focal neurologic deficits are appreciated.  Skin:  Skin is warm, dry and intact. No rash noted. Psychiatric: Mood and affect are anxious. Speech and behavior are normal.  ____________________________________________   LABS (all labs ordered are listed, but only abnormal results are displayed)  Labs Reviewed  BASIC METABOLIC PANEL  TROPONIN I  CBC  PROTIME-INR  APTT   ____________________________________________  EKG  ED ECG REPORT I, Malory Spurr, the attending physician, personally viewed and interpreted this ECG.  Date: 01/21/2015 EKG Time: 15:55 Rate: 70 Rhythm: normal sinus rhythm QRS Axis: normal Intervals: normal ST/T Wave abnormalities: normal Conduction Disutrbances: none Narrative Interpretation: unremarkable  ____________________________________________  RADIOLOGY   Dg Chest 2 View  01/21/2015  CLINICAL DATA:  Chest pain. EXAM: CHEST  2 VIEW COMPARISON:  11/27/2010 FINDINGS: Normal heart size and mediastinal contours. No acute infiltrate or edema. No effusion or pneumothorax. No acute osseous findings. IMPRESSION: No active cardiopulmonary  disease. Electronically Signed   By: Monte Fantasia M.D.   On: 01/21/2015 16:30    ____________________________________________   PROCEDURES  Procedure(s) performed: None  Critical Care performed: Yes, see critical care note(s)   CRITICAL CARE Performed by: Hinda Kehr   Total critical care time: 30 minutes  Critical care time was exclusive of separately billable procedures and treating other patients.  Critical care was necessary to treat or prevent imminent or life-threatening deterioration.  Critical care was time spent personally by me on the following activities: development of treatment plan with patient and/or surrogate as well as nursing, discussions with consultants, evaluation of patient's response to treatment, examination of patient, obtaining history from patient or surrogate, ordering and performing treatments and interventions, ordering and review of laboratory studies, ordering and review of radiographic studies, pulse oximetry and re-evaluation of patient's condition.  ____________________________________________   INITIAL  IMPRESSION / ASSESSMENT AND PLAN / ED COURSE  Pertinent labs & imaging results that were available during my care of the patient were reviewed by me and considered in my medical decision making (see chart for details).  The patient's well score for PE is 1.5 given the relatively recent travel, but she was not truly immobilized during the travel and remained active.  I think that the patient is a very low risk for pulmonary embolism and her symptoms are more consistent with unstable angina given that they are occurring at rest.  She does have a strong family history in the female members of her family and she does have hypertension as well.  I believe that she would benefit from admission and further chest pain workup.  I am starting her on heparin per pharmacy consult for presumed unstable angina.  She has received a full dose of aspirin in the  emergency department.  ____________________________________________  FINAL CLINICAL IMPRESSION(S) / ED DIAGNOSES  Final diagnoses:  Unstable angina ([place])      NEW MEDICATIONS STARTED DURING THIS VISIT:  New Prescriptions   No medications on file     Hinda Kehr, MD 01/21/15 1702

## 2015-01-22 ENCOUNTER — Inpatient Hospital Stay (HOSPITAL_COMMUNITY): Payer: 59

## 2015-01-22 ENCOUNTER — Telehealth: Payer: Self-pay | Admitting: Family Medicine

## 2015-01-22 DIAGNOSIS — F1990 Other psychoactive substance use, unspecified, uncomplicated: Secondary | ICD-10-CM

## 2015-01-22 LAB — BASIC METABOLIC PANEL
Anion gap: 5 (ref 5–15)
BUN: 15 mg/dL (ref 6–20)
CALCIUM: 9.1 mg/dL (ref 8.9–10.3)
CO2: 30 mmol/L (ref 22–32)
CREATININE: 0.85 mg/dL (ref 0.44–1.00)
Chloride: 107 mmol/L (ref 101–111)
GFR calc Af Amer: >60 mL/min (ref >60)
GLUCOSE: 98 mg/dL (ref 65–99)
Potassium: 4.1 mmol/L (ref 3.5–5.1)
SODIUM: 142 mmol/L (ref 135–145)

## 2015-01-22 LAB — CBC
HCT: 37.4 % (ref 35.0–47.0)
Hemoglobin: 12.9 g/dL (ref 12.0–16.0)
MCH: 31.2 pg (ref 26.0–34.0)
MCHC: 34.5 g/dL (ref 32.0–36.0)
MCV: 90.4 fL (ref 80.0–100.0)
PLATELETS: 201 10*3/uL (ref 150–440)
RBC: 4.13 MIL/uL (ref 3.80–5.20)
RDW: 12 % (ref 11.5–14.5)
WBC: 6.7 10*3/uL (ref 3.6–11.0)

## 2015-01-22 LAB — NM MYOCAR MULTI W/SPECT W/WALL MOTION / EF
CHL CUP NUCLEAR SRS: 4
CHL CUP NUCLEAR SSS: 1
CHL CUP STRESS STAGE 1 DBP: 62 mmHg
CHL CUP STRESS STAGE 1 GRADE: 0 %
CHL CUP STRESS STAGE 1 HR: 72 {beats}/min
CHL CUP STRESS STAGE 1 SPEED: 0 mph
CHL CUP STRESS STAGE 2 GRADE: 0 %
CHL CUP STRESS STAGE 2 HR: 72 {beats}/min
CHL CUP STRESS STAGE 3 HR: 120 {beats}/min
CHL CUP STRESS STAGE 4 GRADE: 0 %
CHL CUP STRESS STAGE 4 SPEED: 0 mph
CSEPHR: 73 %
Estimated workload: 1 METS
LVDIAVOL: 53 mL
LVSYSVOL: 23 mL
NUC STRESS TID: 0.97
Peak HR: 120 {beats}/min
Percent of predicted max HR: 72 %
Rest HR: 64 {beats}/min
SDS: 1
Stage 1 SBP: 110 mmHg
Stage 2 Speed: 0 mph
Stage 3 Grade: 0 %
Stage 3 Speed: 0 mph
Stage 4 DBP: 61 mmHg
Stage 4 HR: 86 {beats}/min
Stage 4 SBP: 104 mmHg

## 2015-01-22 LAB — LIPID PANEL
CHOLESTEROL: 177 mg/dL (ref 0–200)
HDL: 68 mg/dL (ref >40)
LDL CALC: 101 mg/dL — AB (ref 0–99)
TRIGLYCERIDES: 38 mg/dL (ref <150)
Total CHOL/HDL Ratio: 2.6 RATIO
VLDL: 8 mg/dL (ref 0–40)

## 2015-01-22 LAB — HEPARIN LEVEL (UNFRACTIONATED)
HEPARIN UNFRACTIONATED: 0.48 [IU]/mL (ref 0.30–0.70)
HEPARIN UNFRACTIONATED: 0.43 [IU]/mL (ref 0.30–0.70)

## 2015-01-22 LAB — TROPONIN I

## 2015-01-22 MED ORDER — REGADENOSON 0.4 MG/5ML IV SOLN
0.4000 mg | Freq: Once | INTRAVENOUS | Status: AC
  Administered 2015-01-22: 0.4 mg via INTRAVENOUS

## 2015-01-22 MED ORDER — TECHNETIUM TC 99M SESTAMIBI - CARDIOLITE
12.4800 | Freq: Once | INTRAVENOUS | Status: AC | PRN
  Administered 2015-01-22: 12:00:00 12.48 via INTRAVENOUS

## 2015-01-22 MED ORDER — TECHNETIUM TC 99M SESTAMIBI GENERIC - CARDIOLITE
31.0800 | Freq: Once | INTRAVENOUS | Status: AC | PRN
  Administered 2015-01-22: 31.08 via INTRAVENOUS

## 2015-01-22 NOTE — Telephone Encounter (Signed)
HFU/pt is being discharged today from the hospital. Diagnosis is Unstable angina. Pt is scheduled for 01/29/2015 '@10'$ :30am. Thank You!

## 2015-01-22 NOTE — Progress Notes (Signed)
Spoke with dr. Posey Pronto to make aware of stress test results. Per md patient okay to be discharged home

## 2015-01-22 NOTE — Telephone Encounter (Signed)
Thank you! Will continue to follow.

## 2015-01-22 NOTE — Progress Notes (Signed)
Discharge medications verified with discharged summary.

## 2015-01-22 NOTE — Discharge Summary (Signed)
Amanda Skinner at [place] NAME: Amanda Skinner    MR#:  [id number]  DATE OF BIRTH:  Jul 17, 1960  DATE OF ADMISSION:  01/21/2015 ADMITTING PHYSICIAN: Amanda Costa, MD  DATE OF DISCHARGE: 01/22/2015    PRIMARY CARE PHYSICIAN: Amanda Rumps, MD    ADMISSION DIAGNOSIS:  Unstable angina ([place]) [I20.0]  DISCHARGE DIAGNOSIS:  Active Problems:   Unstable angina ([place])   SECONDARY DIAGNOSIS:   Past Medical History  Diagnosis Date  . TMJ (dislocation of temporomandibular joint) 2012  . Asthma 2001  .    Marland Kitchen Ulcer   . Diffuse cystic mastopathy 2012    left    HOSPITAL COURSE:    54 year old female with a history of asthma who presented with unstable angina. For further details please refer the H&P.  1. Chest pain: Patient underwent troponins 3 which are negative. Patient was placed on telemetry monitoring which essentially was negative. She underwent cardiac stress test evaluation which was low risk for ischemia. Her chest pain sounded atypical in nature. I suspect this could be related to either anxiety or musculoskeletal issue. I did ask the patient if she would like to try NSAIDs and she told me she would think about it.  2. Mild intermittent asthma: This was not an acute issue during this hospitalization.  DISCHARGE CONDITIONS AND DIET:  home  CONSULTS OBTAINED:     DRUG ALLERGIES:   Allergies  Allergen Reactions  . Etodolac Other (See Comments)    Reaction:  Migraines     DISCHARGE MEDICATIONS:   Current Discharge Medication List    CONTINUE these medications which have NOT CHANGED   Details  albuterol (PROVENTIL HFA;VENTOLIN HFA) 108 (90 BASE) MCG/ACT inhaler Inhale 2 puffs into the lungs every 6 (six) hours as needed for wheezing or shortness of breath.     Cholecalciferol (VITAMIN D3) 400 UNITS CHEW Chew 800 Units by mouth daily.    estradiol (ESTRACE) 0.1 MG/GM vaginal cream Place 1 Applicatorful vaginally 2  (two) times a week.    fluticasone (FLONASE) 50 MCG/ACT nasal spray Place 2 sprays into both nostrils as needed for allergies or rhinitis. Qty: 16 g, Refills: 6   Associated Diagnoses: Other allergic rhinitis              Today   CHIEF COMPLAINT:  Patient continued to have some strange twinging of the chest   VITAL SIGNS:  Blood pressure 93/59, pulse 57, temperature 98.4 F (36.9 C), temperature source Oral, resp. rate 18, height 5' (1.524 m), weight 54.386 kg (119 lb 14.4 oz), SpO2 98 %.   REVIEW OF SYSTEMS:  Review of Systems  Constitutional: Negative for fever, chills and malaise/fatigue.  HENT: Negative for sore throat.   Eyes: Negative for blurred vision.  Respiratory: Negative for cough, hemoptysis, shortness of breath and wheezing.   Cardiovascular: Positive for chest pain. Negative for palpitations and leg swelling.  Gastrointestinal: Negative for nausea, vomiting, abdominal pain, diarrhea and blood in stool.  Genitourinary: Negative for dysuria.  Musculoskeletal: Negative for back pain.  Neurological: Negative for dizziness, tremors and headaches.  Endo/Heme/Allergies: Does not bruise/bleed easily.     PHYSICAL EXAMINATION:  GENERAL:  54 y.o.-year-old patient lying in the bed with no acute distress.  NECK:  Supple, no jugular venous distention. No thyroid enlargement, no tenderness.  LUNGS: Normal breath sounds bilaterally, no wheezing, rales,rhonchi  No use of accessory muscles of respiration.  CARDIOVASCULAR: S1, S2 normal. No murmurs, rubs, or gallops.  ABDOMEN: Soft, non-tender, non-distended. Bowel sounds present. No organomegaly or mass.  EXTREMITIES: No pedal edema, cyanosis, or clubbing.  PSYCHIATRIC: The patient is alert and oriented x 3.  SKIN: No obvious rash, lesion, or ulcer.   DATA REVIEW:   CBC  Recent Labs Lab 01/22/15 0109  WBC 6.7  HGB 12.9  HCT 37.4  PLT 201    Chemistries   Recent Labs Lab 01/22/15 0109  NA 142  K 4.1   CL 107  CO2 30  GLUCOSE 98  BUN 15  CREATININE 0.85  CALCIUM 9.1    Cardiac Enzymes  Recent Labs Lab 01/21/15 1558 01/21/15 1830 01/22/15 0109  TROPONINI <0.03 <0.03 <0.03    Microbiology Results  '@[id number]'$ @  RADIOLOGY:  Dg Chest 2 View  01/21/2015  CLINICAL DATA:  Chest pain. EXAM: CHEST  2 VIEW COMPARISON:  11/27/2010 FINDINGS: Normal heart size and mediastinal contours. No acute infiltrate or edema. No effusion or pneumothorax. No acute osseous findings. IMPRESSION: No active cardiopulmonary disease. Electronically Signed   By: Monte Fantasia M.D.   On: 01/21/2015 16:30      Management plans discussed with the patient and she is in agreement. Stable for discharge home  Patient should follow up with cardiology in 1 week  CODE STATUS:     Code Status Orders        Start     Ordered   01/21/15 2116  Full code   Continuous     01/21/15 2115      TOTAL TIME TAKING CARE OF THIS PATIENT: 35 minutes.    Note: This dictation was prepared with Dragon dictation along with smaller phrase technology. Any transcriptional errors that result from this process are unintentional.  Amanda Skinner M.D on 01/22/2015 at 1:02 PM  Between 7am to 6pm - Pager - [phone] After 6pm go to [email or web address] - password [email or web address] [facility]  [place] [facility]  Office  [phone]  CC: Primary care physician; Amanda Rumps, MD

## 2015-01-22 NOTE — Progress Notes (Signed)
Amanda Skinner at Amanda Skinner: Amanda Skinner    MR#:  [id number]  DATE OF BIRTH:  12/30/1960  SUBJECTIVE:   Patient will with intermitted chest twinges unlike yesterday  REVIEW OF SYSTEMS:    Review of Systems  Constitutional: Negative for fever, chills and malaise/fatigue.  HENT: Negative for sore throat.   Eyes: Negative for blurred vision.  Respiratory: Negative for cough, hemoptysis, shortness of breath and wheezing.   Cardiovascular: Positive for chest pain. Negative for palpitations and leg swelling.  Gastrointestinal: Negative for nausea, vomiting, abdominal pain, diarrhea and blood in stool.  Genitourinary: Negative for dysuria.  Musculoskeletal: Negative for back pain.  Neurological: Negative for dizziness, tremors and headaches.  Endo/Heme/Allergies: Does not bruise/bleed easily.    Tolerating Diet:yes      DRUG ALLERGIES:   Allergies  Allergen Reactions  . Etodolac Other (See Comments)    Reaction:  Migraines     VITALS:  Blood pressure 93/59, pulse 57, temperature 98.4 F (36.9 C), temperature source Oral, resp. rate 18, height 5' (1.524 m), weight 54.386 kg (119 lb 14.4 oz), SpO2 98 %.  PHYSICAL EXAMINATION:   Physical Exam  Constitutional: She is oriented to person, place, and time and well-developed, well-nourished, and in no distress. No distress.  HENT:  Head: Normocephalic.  Eyes: No scleral icterus.  Neck: Normal range of motion. Neck supple. No JVD present. No tracheal deviation present.  Cardiovascular: Normal rate, regular rhythm and normal heart sounds.  Exam reveals no gallop and no friction rub.   No murmur heard. Pulmonary/Chest: Effort normal and breath sounds normal. No respiratory distress. She has no wheezes. She has no rales. She exhibits no tenderness.  Abdominal: Soft. Bowel sounds are normal. She exhibits no distension and no mass. There is no tenderness. There is no rebound and no  guarding.  Musculoskeletal: Normal range of motion. She exhibits no edema.  Neurological: She is alert and oriented to person, place, and time.  Skin: Skin is warm. No rash noted. No erythema.  Psychiatric: Affect and judgment normal.      LABORATORY PANEL:   CBC  Recent Labs Lab 01/22/15 0109  WBC 6.7  HGB 12.9  HCT 37.4  PLT 201   ------------------------------------------------------------------------------------------------------------------  Chemistries   Recent Labs Lab 01/22/15 0109  NA 142  K 4.1  CL 107  CO2 30  GLUCOSE 98  BUN 15  CREATININE 0.85  CALCIUM 9.1   ------------------------------------------------------------------------------------------------------------------  Cardiac Enzymes  Recent Labs Lab 01/21/15 1558 01/21/15 1830 01/22/15 0109  TROPONINI <0.03 <0.03 <0.03   ------------------------------------------------------------------------------------------------------------------  RADIOLOGY:  Dg Chest 2 View  01/21/2015  CLINICAL DATA:  Chest pain. EXAM: CHEST  2 VIEW COMPARISON:  11/27/2010 FINDINGS: Normal heart size and mediastinal contours. No acute infiltrate or edema. No effusion or pneumothorax. No acute osseous findings. IMPRESSION: No active cardiopulmonary disease. Electronically Signed   By: Monte Fantasia M.D.   On: 01/21/2015 16:30     ASSESSMENT AND PLAN:   54 year old female with a history of asthma who presented with unstable angina. For further details please refer the H&P.  1. Chest pain: Patient underwent troponins 3 which are negative. Patient was placed on telemetry monitoring which essentially was negative. She underwent cardiac stress test evaluation. Her chest pain sounded atypical in nature. I suspect this could be related to either anxiety or musculoskeletal issue. I did ask the patient if she would like to try NSAIDs and she told me she  would think about it.  2. Mild intermittent asthma: This is not an  acute issue during this hospitalization      Management plans discussed with the patient and she is in agreement.  CODE STATUS: FULL  TOTAL TIME TAKING CARE OF THIS PATIENT: 30 minutes.     POSSIBLE D/C today or tomorrow, DEPENDING ON stress test  Dniya Neuhaus M.D on 01/22/2015 at 1:09 PM  Between 7am to 6pm - Pager - [phone] After 6pm go to [email or web address] - password [email or web address] [place] [facility]  Office  [phone]  CC: Primary care physician; Tommi Rumps, MD  Note: This dictation was prepared with Dragon dictation along with smaller phrase technology. Any transcriptional errors that result from this process are unintentional.

## 2015-01-22 NOTE — Progress Notes (Signed)
Spoke with dr. Benjie Karvonen to make aware patient has orders for stress test however no order to be npo or cardiology consult. Per md place order for npo and she does not need a cardiology consult. md ordered for heparin drip to be discontinued since patient is chest pain free and troponins are negative x3

## 2015-01-22 NOTE — Progress Notes (Signed)
Patient discharged via wheelchair and private vehicle. IV removed and catheter intact. All discharge instructions given and patient verbalizes understanding. Tele removed and returned. No prescriptions given to patient No distress noted.   

## 2015-01-22 NOTE — Consult Note (Signed)
ANTICOAGULATION CONSULT NOTE - Initial Consult  Pharmacy Consult for heparin Indication: chest pain/ACS / unstable angina  Allergies  Allergen Reactions  . Etodolac Other (See Comments)    Reaction:  Migraines     Patient Measurements: Height: 5' (152.4 cm) Weight: 119 lb 14.4 oz (54.386 kg) IBW/kg (Calculated) : 45.5 Heparin Dosing Weight: 53.1kg  Vital Signs: Temp: 97.7 F (36.5 C) (11/28 2105) Temp Source: Oral (11/28 2105) BP: 109/43 mmHg (11/28 2105) Pulse Rate: 59 (11/28 2105)  Labs:  Recent Labs  01/21/15 1558 01/21/15 1830 01/22/15 0109  HGB 14.0  --   --   HCT 41.0  --   --   PLT 223  --   --   APTT 30  --   --   LABPROT 13.0  --   --   INR 0.96  --   --   HEPARINUNFRC  --   --  0.48  CREATININE 0.79  --   --   TROPONINI <0.03 <0.03 <0.03    Estimated Creatinine Clearance: 57.1 mL/min (by C-G formula based on Cr of 0.79).   Medical History: Past Medical History  Diagnosis Date  . TMJ (dislocation of temporomandibular joint) 2012  . Asthma 2001  . Hypertension   . Ulcer   . Diffuse cystic mastopathy 2012    left    Medications:  Scheduled:  . aspirin EC  325 mg Oral Daily  . cholecalciferol  800 Units Oral Daily  . [START ON 01/24/2015] estradiol  1 Applicatorful Vaginal Once per day on Mon Thu  . sodium chloride  3 mL Intravenous Q12H    Assessment: Pt is a 54 year old female with unstable angina. Pharmacy consulted to dose a heparin drip.   Goal of Therapy:  Heparin level 0.3-0.7 units/ml Monitor platelets by anticoagulation protocol: Yes   Plan:  Give 3150 units bolus x 1 Start heparin infusion at 650 units/hr Check anti-Xa level in 6 hours and daily while on heparin Continue to monitor H&H and platelets    11/29 0100 heparin level 0.48. Recheck in 6 hours to confirm.  Kathi Dohn S 01/22/2015,3:43 AM

## 2015-01-23 ENCOUNTER — Telehealth: Payer: Self-pay

## 2015-01-23 NOTE — Telephone Encounter (Signed)
Transition Care Management Follow-up Telephone Call   Date discharged? 01/22/15   How have you been since you were released from the hospital? Some twinges last night and today, but not pain. The twinges are not as bad either. I am restricting myself from moving too fast.  Appetite is ok.   Do you understand why you were in the hospital? Yes, chest pain.   Do you understand the discharge instructions? Yes, no problems.   Where were you discharged to? Home   Items Reviewed:  Medications reviewed: Yes, no changes.  Allergies reviewed: Yes, no changes.  Dietary changes reviewed: Yes, no changes.  Referrals reviewed: Yes, cardiology appointment made.   Functional Questionnaire:   Activities of Daily Living (ADLs):   She states they are independent in the following: Independent in all ADLs. States they require assistance with the following: No assistance currently required.   Any transportation issues/concerns?: Not at this time.   Any patient concerns? Yes, need for reflux medicine and medication refill of albuterol inhaler.   Confirmed importance and date/time of follow-up visits scheduled Yes, appointment scheduled for 01/28/15 at 4pm.  Provider Appointment booked with Dr. Caryl Bis (PCP)  Confirmed with patient if condition begins to worsen call PCP or go to the ER.  Patient was given the office number and encouraged to call back with question or concerns.  : Yes, patient verbalized understanding.

## 2015-01-28 ENCOUNTER — Ambulatory Visit (INDEPENDENT_AMBULATORY_CARE_PROVIDER_SITE_OTHER): Payer: 59 | Admitting: Family Medicine

## 2015-01-28 VITALS — BP 100/62 | HR 76 | Temp 98.6°F | Wt 121.0 lb

## 2015-01-28 DIAGNOSIS — R0789 Other chest pain: Secondary | ICD-10-CM | POA: Diagnosis not present

## 2015-01-28 DIAGNOSIS — J4520 Mild intermittent asthma, uncomplicated: Secondary | ICD-10-CM | POA: Diagnosis not present

## 2015-01-28 MED ORDER — ALBUTEROL SULFATE HFA 108 (90 BASE) MCG/ACT IN AERS: 2.0000 | INHALATION_SPRAY | Freq: Four times a day (QID) | RESPIRATORY_TRACT | Status: DC | PRN

## 2015-01-28 MED ORDER — OMEPRAZOLE 20 MG PO CPDR: 20.0000 mg | DELAYED_RELEASE_CAPSULE | Freq: Every day | ORAL | Status: DC

## 2015-01-28 NOTE — Progress Notes (Signed)
Pre visit review using our clinic review tool, if applicable. No additional management support is needed unless otherwise documented below in the visit note. 

## 2015-01-28 NOTE — Patient Instructions (Signed)
Nice to see you. Your discomfort is likely related to irritation of the joints in your chest. Could also be related to reflux. We will treat you with ibuprofen 600 mg every 8 hours by mouth for the next 5 days and then every 8 hours as needed. You can use ice on the area. You should take this with food. We will start you on omeprazole for reflux. If your reflux worsens or you develop abdominal pain with this you need to stop the ibuprofen. If you develop abdominal pain, chest pain, shortness of breath, palpitations, increasing reflux, wheezing, or any new or change in symptoms please seek medical attention.  Costochondritis Costochondritis, sometimes called Tietze syndrome, is a swelling and irritation (inflammation) of the tissue (cartilage) that connects your ribs with your breastbone (sternum). It causes pain in the chest and rib area. Costochondritis usually goes away on its own over time. It can take up to 6 weeks or longer to get better, especially if you are unable to limit your activities. CAUSES  Some cases of costochondritis have no known cause. Possible causes include:  Injury (trauma).  Exercise or activity such as lifting.  Severe coughing. SIGNS AND SYMPTOMS  Pain and tenderness in the chest and rib area.  Pain that gets worse when coughing or taking deep breaths.  Pain that gets worse with specific movements. DIAGNOSIS  Your health care provider will do a physical exam and ask about your symptoms. Chest X-rays or other tests may be done to rule out other problems. TREATMENT  Costochondritis usually goes away on its own over time. Your health care provider may prescribe medicine to help relieve pain. HOME CARE INSTRUCTIONS   Avoid exhausting physical activity. Try not to strain your ribs during normal activity. This would include any activities using chest, abdominal, and side muscles, especially if heavy weights are used.  Apply ice to the affected area for the first 2  days after the pain begins.  Put ice in a plastic bag.  Place a towel between your skin and the bag.  Leave the ice on for 20 minutes, 2-3 times a day.  Only take over-the-counter or prescription medicines as directed by your health care provider. SEEK MEDICAL CARE IF:  You have redness or swelling at the rib joints. These are signs of infection.  Your pain does not go away despite rest or medicine. SEEK IMMEDIATE MEDICAL CARE IF:   Your pain increases or you are very uncomfortable.  You have shortness of breath or difficulty breathing.  You cough up blood.  You have worse chest pains, sweating, or vomiting.  You have a fever or persistent symptoms for more than 2-3 days.  You have a fever and your symptoms suddenly get worse. MAKE SURE YOU:   Understand these instructions.  Will watch your condition.  Will get help right away if you are not doing well or get worse.   This information is not intended to replace advice given to you by your health care provider. Make sure you discuss any questions you have with your health care provider.   Document Released: 11/19/2004 Document Revised: 11/30/2012 Document Reviewed: 09/13/2012 Elsevier Interactive Patient Education [employer].

## 2015-01-29 ENCOUNTER — Ambulatory Visit: Payer: Self-pay | Admitting: Family Medicine

## 2015-01-29 ENCOUNTER — Encounter: Payer: Self-pay | Admitting: Family Medicine

## 2015-01-29 DIAGNOSIS — R0789 Other chest pain: Secondary | ICD-10-CM | POA: Insufficient documentation

## 2015-01-29 NOTE — Assessment & Plan Note (Signed)
Infrequent issues with her asthma. We will refill her albuterol inhaler today.

## 2015-01-29 NOTE — Assessment & Plan Note (Signed)
Patient with left-sided costochondral tenderness to palpation most consistent with musculoskeletal chest pain and costochondritis. She had a negative stress test. She had negative troponins in the hospital. Doubt cardiac cause in patient with no known cardiac risk factors and recent negative cardiac workup. Patient with a wells score of 0 and stable vital signs making PE unlikely cause. Could be related to reflux as she does have symptoms of reflux. Doubt pulmonary cause with normal vitals and oxygenation and normal pulmonary exam. We will treat her for musculoskeletal chest pain with anti-inflammatories and treat her reflux with a PPI. She is advised she needs to take anti-inflammatory with food. She was advised that this could worsen her reflux. If it does this she is to let us know. She is given return precautions.

## 2015-01-29 NOTE — Progress Notes (Signed)
Patient ID: Amanda Skinner, female   DOB: 11/14/1960, 54 y.o.   MRN: [id number]  Tommi Rumps, MD Phone: [phone]  Amanda Skinner is a 54 y.o. female who presents today for follow-up.  Patient presents for follow-up from hospitalization. She was hospitalized for chest pain. She notes 2 weeks ago she developed intermittent sharp brief (less than 1 second) twinges of discomfort in the left aspect of her sternum. She notes this occurred for about a week and then on this past Saturday she developed an increasing chest pain in the left side of her chest that felt like a squeezing pain. States it felt like there was something in there. It did not radiate. She had no diaphoresis or nausea with it. She notes some minimal dizziness with it initially. She had no shortness of breath with this. She notes it would worsen if she was in certain positions. She was hospitalized and underwent cardiac stress testing that revealed a low risk stress test. She had negative troponins. She has not had any recent surgeries. She does not have any cough. She's not having leg swelling. She's not had any prolonged periods of immobility recently. She notes her pain is not exertional. She's not tried any NSAIDs. She does note some reflux particularly if she lays down after eating. She notes she is 85% better at this time. She notes there is a low level of minimal discomfort that is present most of the time. She notes she can reproduce the discomfort by pressing on her left costochondral joints and by turning in certain positions.  Asthma: Patient has a long history of asthma. She infrequently uses her albuterol inhaler. The last time she used it was 3-4 months ago. She is due for a tightness she had her chest and this was relieved. Her albuterol inhalers out of date and she needs a refill.  PMH: nonsmoker.   ROS see history of present illness  Objective  Physical Exam Filed Vitals:   01/28/15 1558  BP: 100/62    Pulse: 76  Temp: 98.6 F (37 C)    Physical Exam  Constitutional: She is well-developed, well-nourished, and in no distress.  HENT:  Head: Normocephalic and atraumatic.  Cardiovascular: Normal rate, regular rhythm and normal heart sounds.  Exam reveals no gallop and no friction rub.   No murmur heard. Pulmonary/Chest: Effort normal and breath sounds normal. She exhibits tenderness (left costochondral joints tender to palpation).  Abdominal: Soft. Bowel sounds are normal. She exhibits no distension. There is no tenderness. There is no rebound and no guarding.  Musculoskeletal:  No calf swelling, tenderness, or cords  Neurological: She is alert. Gait normal.  Skin: Skin is warm and dry. She is not diaphoretic.     Assessment/Plan: Please see individual problem list.  Musculoskeletal chest pain Patient with left-sided costochondral tenderness to palpation most consistent with musculoskeletal chest pain and costochondritis. She had a negative stress test. She had negative troponins in the hospital. Doubt cardiac cause in patient with no known cardiac risk factors and recent negative cardiac workup. Patient with a wells score of 0 and stable vital signs making PE unlikely cause. Could be related to reflux as she does have symptoms of reflux. Doubt pulmonary cause with normal vitals and oxygenation and normal pulmonary exam. We will treat her for musculoskeletal chest pain with anti-inflammatories and treat her reflux with a PPI. She is advised she needs to take anti-inflammatory with food. She was advised that this could worsen her reflux.  If it does this she is to let us know. She is given return precautions.  Asthma, chronic Infrequent issues with her asthma. We will refill her albuterol inhaler today.    Meds ordered this encounter  Medications  . albuterol (PROVENTIL HFA;VENTOLIN HFA) 108 (90 BASE) MCG/ACT inhaler    Sig: Inhale 2 puffs into the lungs every 6 (six) hours as needed  for wheezing or shortness of breath.    Dispense:  2 Inhaler    Refill:  0  . omeprazole (PRILOSEC) 20 MG capsule    Sig: Take 1 capsule (20 mg total) by mouth daily.    Dispense:  30 capsule    Refill:  [place]

## 2015-02-01 ENCOUNTER — Encounter: Payer: Self-pay | Admitting: Cardiovascular Disease

## 2015-02-01 ENCOUNTER — Ambulatory Visit (INDEPENDENT_AMBULATORY_CARE_PROVIDER_SITE_OTHER): Payer: 59 | Admitting: Cardiovascular Disease

## 2015-02-01 VITALS — BP 100/78 | HR 57 | Ht 61.0 in | Wt 122.5 lb

## 2015-02-01 DIAGNOSIS — R0789 Other chest pain: Secondary | ICD-10-CM

## 2015-02-01 DIAGNOSIS — Z8249 Family history of ischemic heart disease and other diseases of the circulatory system: Secondary | ICD-10-CM | POA: Diagnosis not present

## 2015-02-01 DIAGNOSIS — R079 Chest pain, unspecified: Secondary | ICD-10-CM

## 2015-02-01 NOTE — Patient Instructions (Addendum)
You are doing well.  We will schedule a CT coronary calcium score for chest pain at your convenience next year Please call to schedule:  [phone]  Please call us if you have new issues that need to be addressed before your next appt.    Coronary Calcium Scan A coronary calcium scan is an imaging test used to look for deposits of calcium and other fatty materials (plaques) in the inner lining of the blood vessels of your heart (coronary arteries). These deposits of calcium and plaques can partly clog and narrow the coronary arteries without producing any symptoms or warning signs. This puts you at risk for a heart attack. This test can detect these deposits before symptoms develop.  LET [facility] CARE PROVIDER KNOW ABOUT:  Any allergies you have.  All medicines you are taking, including vitamins, herbs, eye drops, creams, and over-the-counter medicines.  Previous problems you or members of your family have had with the use of anesthetics.  Any blood disorders you have.  Previous surgeries you have had.  Medical conditions you have.  Possibility of pregnancy, if this applies. RISKS AND COMPLICATIONS Generally, this is a safe procedure. However, as with any procedure, complications can occur. This test involves the use of radiation. Radiation exposure can be dangerous to a pregnant woman and her unborn baby. If you are pregnant, you should not have this procedure done.  BEFORE THE PROCEDURE There is no special preparation for the procedure. PROCEDURE  You will need to undress and put on a hospital gown. You will need to remove any jewelry around your neck or chest.  Sticky electrodes are placed on your chest and are connected to an electrocardiogram (EKG or electrocardiography) machine to recorda tracing of the electrical activity of your heart.  A CT scanner will take pictures of your heart. During this time, you will be asked to lie still and hold your breath for 2-3 seconds  while a picture is being taken of your heart. AFTER THE PROCEDURE   You will be allowed to get dressed.  You can return to your normal activities after the scan is done.   This information is not intended to replace advice given to you by your health care provider. Make sure you discuss any questions you have with your health care provider.   Document Released: 08/08/2007 Document Revised: 02/14/2013 Document Reviewed: 10/17/2012 Elsevier Interactive Patient Education [employer].

## 2015-02-01 NOTE — Assessment & Plan Note (Signed)
Improvement in her symptoms with ibuprofen No further testing at this time

## 2015-02-01 NOTE — Assessment & Plan Note (Addendum)
Hospital records reviewed with her EKG normal, cardiac enzymes normal, stress test with no ischemia Strong family history of coronary artery disease We have recommended she consider CT coronary calcium score for risk stratification She would like to do the scan early next year Nonsmoker, no diabetes Recommended she call us if she has worsening symptoms, for now would continue NSAIDs

## 2015-02-01 NOTE — Assessment & Plan Note (Signed)
Mother with three-vessel coronary artery disease, shift to [facility], died after bypass surgery In light of this, we'll treat her aggressively, consider CT coronary calcium score for risk stratification

## 2015-02-01 NOTE — Progress Notes (Signed)
Patient ID: Amanda Skinner, female    DOB: [birth year], 54 y.o.   MRN: [id number]  HPI Comments: Amanda Skinner is a pleasant 54 year old woman with history of asthma, who presents for new patient evaluation for recent episodes of chest pain  She reports that 01/20/2015 she developed some chest discomfort on the left It felt like a squeezing under her left breast with episodes of twinges, throbbing, some tenderness on palpation in her mediastinal area Symptoms persisted into the next day and she went to the hospital emergency room She was started on heparin, cardiac enzymes were normal, EKG unrevealing She had stress test which showed no ischemia  She's had follow-up with Dr. 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 Etodolac Other (See Comments)    Reaction:  Migraines     Current Outpatient Prescriptions on File Prior to Visit  Medication Sig Dispense Refill  . albuterol (PROVENTIL HFA;VENTOLIN HFA) 108 (90 BASE) MCG/ACT inhaler Inhale 2 puffs into the lungs every 6 (six) hours as needed for wheezing or shortness of breath. 2 Inhaler 0  . Cholecalciferol (VITAMIN D3) 400 UNITS CHEW Chew 800 Units by mouth daily.    Marland Kitchen estradiol (ESTRACE) 0.1 MG/GM vaginal cream Place 1 Applicatorful vaginally 2 (two) times a week.    . fluticasone (FLONASE) 50 MCG/ACT nasal spray Place 2 sprays into both nostrils as needed for allergies or  rhinitis. 16 g 6  . omeprazole (PRILOSEC) 20 MG capsule Take 1 capsule (20 mg total) by mouth daily. 30 capsule 3   No current facility-administered medications on file prior to visit.    Past Medical History  Diagnosis Date  . TMJ (dislocation of temporomandibular joint) 2012  . Asthma 2001  . Hypertension   . Ulcer   . Diffuse cystic mastopathy 2012    left    Past Surgical History  Procedure Laterality Date  . Rhinoplasty  1996  . Cesarean section  1991  . Tubal ligation  1991  . Tonsilloadenoidectomy   1978  . Lower extremity surgery   2001  . Foot surgery Left 10/2011  . Colonoscopy  June 2015    [name]    Social History  reports that she has never smoked. She has never used smokeless tobacco. She reports that she does not drink alcohol or use illicit drugs.  Family History family history includes Heart attack in her mother; Heart disease in her maternal grandmother and mother.   Review of Systems  Constitutional: Negative.   Respiratory: Negative.   Cardiovascular: Positive for chest pain.  Gastrointestinal: Negative.   Musculoskeletal: Negative.   Neurological: Negative.   Hematological: Negative.   Psychiatric/Behavioral: Negative.   All other systems reviewed and are negative.   BP 100/78 mmHg  Pulse 57  Ht '5\' 1"'$  (1.549 m)  Wt 122 lb 8 oz (55.566 kg)  BMI 23.16 kg/m2   Physical Exam  Constitutional: She is oriented to person,  place, and time. She appears well-developed and well-nourished.  HENT:  Head: Normocephalic.  Nose: Nose normal.  Mouth/Throat: Oropharynx is clear and moist.  Eyes: Conjunctivae are normal. Pupils are equal, round, and reactive to light.  Neck: Normal range of motion. Neck supple. No JVD present.  Cardiovascular: Normal rate, regular rhythm, normal heart sounds and intact distal pulses.  Exam reveals no gallop and no friction rub.   No murmur heard. Pulmonary/Chest: Effort normal and breath sounds normal. No respiratory  distress. She has no wheezes. She has no rales. She exhibits no tenderness.  Abdominal: Soft. Bowel sounds are normal. She exhibits no distension. There is no tenderness.  Musculoskeletal: Normal range of motion. She exhibits no edema or tenderness.  Lymphadenopathy:    She has no cervical adenopathy.  Neurological: She is alert and oriented to person, place, and time. Coordination normal.  Skin: Skin is warm and dry. No rash noted. No erythema.  Psychiatric: She has a normal mood and affect. Her behavior is normal. Judgment and thought content normal.

## 2015-02-06 ENCOUNTER — Other Ambulatory Visit: Payer: Self-pay

## 2015-02-06 DIAGNOSIS — Z1231 Encounter for screening mammogram for malignant neoplasm of breast: Secondary | ICD-10-CM

## 2015-02-27 ENCOUNTER — Encounter: Payer: Self-pay | Admitting: Family Medicine

## 2015-02-27 ENCOUNTER — Ambulatory Visit (INDEPENDENT_AMBULATORY_CARE_PROVIDER_SITE_OTHER): Payer: 59 | Admitting: Family Medicine

## 2015-02-27 VITALS — BP 104/70 | HR 77 | Temp 98.3°F | Ht 61.0 in | Wt 125.0 lb

## 2015-02-27 DIAGNOSIS — R0789 Other chest pain: Secondary | ICD-10-CM | POA: Diagnosis not present

## 2015-02-27 DIAGNOSIS — G5762 Lesion of plantar nerve, left lower limb: Secondary | ICD-10-CM | POA: Diagnosis not present

## 2015-02-27 DIAGNOSIS — N6019 Diffuse cystic mastopathy of unspecified breast: Secondary | ICD-10-CM

## 2015-02-27 NOTE — Patient Instructions (Addendum)
Nice to see you.  we will refer you to sports medicine in [facility] for evaluation of your Morton's neuroma. Please call the surgeon to schedule mammogram and ultrasound of your breasts. If you develop chest pain, shortness of breath, sweating, palpitations, numbness, weakness, or any new or changing symptoms please seek medical attention.

## 2015-02-27 NOTE — Progress Notes (Signed)
Pre visit review using our clinic review tool, if applicable. No additional management support is needed unless otherwise documented below in the visit note. 

## 2015-03-02 DIAGNOSIS — G5760 Lesion of plantar nerve, unspecified lower limb: Secondary | ICD-10-CM | POA: Insufficient documentation

## 2015-03-02 NOTE — Assessment & Plan Note (Signed)
Patient is atypical. Consistent musculoskeletal chest pain with negative cardiac workup. No recurrent symptoms. Suspect rib pain she had was due to muscular issue as well. No issues at this time. she can continue as needed ibuprofen. We'll continue to monitor.

## 2015-03-02 NOTE — Assessment & Plan Note (Signed)
Patient reports history of Morton's neuroma. Location is certainly typical for this. Has been to podiatry previously with little benefit. She asks today if Cymbalta would be beneficial as her children are in the medical field and told her about this. She does not want to start this. I do not think this would be beneficial. I think she would benefit from evaluation by sports medicine physician for likely custom orthotics. We will refer her sports medicine in [place].

## 2015-03-02 NOTE — Progress Notes (Signed)
Patient ID: [name], female   DOB: 06/09/1960, 55 y.o.   MRN: [id number]  Tommi Rumps, MD Phone: [phone]  Sharene Skeans Stroh is a 55 y.o. female who presents today for follow-up.  Musculoskeletal chest pain: Patient notes this is significantly improved. Patient had seen cardiology. Pain was felt to be atypical and likely musculoskeletal. She's had a cardiac workup of this. Is felt to be musculoskeletal in nature. They did discuss obtaining a coronary calcium scan for risk stratification she plans do that early this year. No shortness of breath or diaphoresis. She does note she had some soreness in her ribs after doing about 20 pushups, though no chest pain. This pain went away with ibuprofen. No rib pain at this time.  Breast cysts: Patient notes she has a history of cysts in her breasts. She goes for yearly mammogram and ultrasound with her surgeon. She typically has to get a cyst drained per year. Has been about 2 years since she has had to have one drained though. Notes 2 areas that have some discomfort in her bilateral breasts. Notes they're sore. No skin changes. No nipple discharge. She is due for a mammogram in February.  Morton neuroma: Patient reports having seen podiatry for this in her left foot. She notes she occasionally gets pain in the ball of her foot. Some tingling into her toes as well in that foot. She notes she tried over-the-counter inserts previously with no benefit.  PMH: nonsmoker.   ROS see history of present illness  Objective  Physical Exam Filed Vitals:   02/27/15 1449  BP: 104/70  Pulse: 77  Temp: 98.3 F (36.8 C)    BP Readings from Last 3 Encounters:  02/27/15 104/70  02/01/15 100/78  01/28/15 100/62   Wt Readings from Last 3 Encounters:  02/27/15 125 lb (56.7 kg)  02/01/15 122 lb 8 oz (55.566 kg)  01/28/15 121 lb (54.885 kg)    Physical Exam  Constitutional: She is well-developed, well-nourished, and in no distress.  HENT:    Head: Normocephalic and atraumatic.  Right Ear: External ear normal.  Left Ear: External ear normal.  Mouth/Throat: Oropharynx is clear and moist. No oropharyngeal exudate.  Eyes: Conjunctivae are normal. Pupils are equal, round, and reactive to light.  Neck: Neck supple.  Cardiovascular: Normal rate, regular rhythm and normal heart sounds.  Exam reveals no gallop and no friction rub.   No murmur heard. Pulmonary/Chest: Effort normal and breath sounds normal. No respiratory distress. She has no wheezes. She has no rales. She exhibits no tenderness.  Musculoskeletal:  Left foot with no swelling, there is mild tenderness in the middle of the ball of the foot, there is no swelling in this area, there are no skin changes, no bony tenderness, toes are warm and well perfused  Lymphadenopathy:    She has no cervical adenopathy.  Neurological: She is alert. Gait normal.  Bilateral feet with sensation to light touch intact, 5 out of 5 strength in plantar flexion and dorsiflexion  Skin: Skin is warm and dry. She is not diaphoretic.  Breast exam reveals likely cystic structure in left breast about 2 cm directly inferior to the nipple and aereola, no overlying skin changes, this area is minimally tender, right breast exam revealed similar likely cystic lesion in the upper outer quadrant about a centimeter from the nipple and aereola, no overlying skin changes, no other masses palpated in either breast or axilla     Assessment/Plan: Please see individual problem  list.  Musculoskeletal chest pain Patient is atypical. Consistent musculoskeletal chest pain with negative cardiac workup. No recurrent symptoms. Suspect rib pain she had was due to muscular issue as well. No issues at this time. she can continue as needed ibuprofen. We'll continue to monitor.  Diffuse cystic mastopathy Patient with cystic breast disease. Lesions palpated in her breasts likely represent cysts that need to be drained. I  discussed obtaining a mammogram and ultrasound versus having her call her surgeon to set this up. She opted to call the surgeon on her own. She is advised to do this as soon as possible.  Morton's metatarsalgia, neuralgia, or neuroma Patient reports history of Morton's neuroma. Location is certainly typical for this. Has been to podiatry previously with little benefit. She asks today if Cymbalta would be beneficial as her children are in the medical field and told her about this. She does not want to start this. I do not think this would be beneficial. I think she would benefit from evaluation by sports medicine physician for likely custom orthotics. We will refer her sports medicine in [place].    Orders Placed This Encounter  Procedures  . Ambulatory referral to Sports Medicine    Referral Priority:  Routine    Referral Type:  Consultation    Number of Visits Requested:  1    Dragon voice recognition software was used during the dictation process of this note. If any phrases or words seem inappropriate it is likely secondary to the translation process being inefficient.  Tommi Rumps

## 2015-03-02 NOTE — Assessment & Plan Note (Signed)
Patient with cystic breast disease. Lesions palpated in her breasts likely represent cysts that need to be drained. I discussed obtaining a mammogram and ultrasound versus having her call her surgeon to set this up. She opted to call the surgeon on her own. She is advised to do this as soon as possible.

## 2015-04-05 ENCOUNTER — Ambulatory Visit (INDEPENDENT_AMBULATORY_CARE_PROVIDER_SITE_OTHER): Payer: 59 | Admitting: Family Medicine

## 2015-04-05 ENCOUNTER — Ambulatory Visit (INDEPENDENT_AMBULATORY_CARE_PROVIDER_SITE_OTHER)
Admission: RE | Admit: 2015-04-05 | Discharge: 2015-04-05 | Disposition: A | Discharge status: Home / Self Care | Payer: Self-pay | Source: Ambulatory Visit | Attending: Cardiovascular Disease | Admitting: Cardiovascular Disease

## 2015-04-05 ENCOUNTER — Encounter: Payer: Self-pay | Admitting: Family Medicine

## 2015-04-05 VITALS — BP 110/38 | Ht 61.0 in | Wt 119.0 lb

## 2015-04-05 DIAGNOSIS — R079 Chest pain, unspecified: Secondary | ICD-10-CM

## 2015-04-05 DIAGNOSIS — G5762 Lesion of plantar nerve, left lower limb: Secondary | ICD-10-CM

## 2015-04-05 IMAGING — CT CT HEART SCORING
2 series · 16 of 20 positions shown, 18 images · non-contrast
Comparison: No priors.

CLINICAL DATA: Risk stratification

EXAM:
Coronary Calcium Score
TECHNIQUE: The patient was scanned on a Siemens Sensation 16 slice scanner.
Axial non-contrast 3 mm slices were carried out through the heart.
The data set was analyzed on a dedicated work station and scored
using the Agatson method.

[Series 2: casc 3.0 i36f 2 bestdiast 72 % · axial · 0.29mm/px · z∈[-214,-130]mm · 8 of 38 slices shown, 10 images]
[im 5/38  vessel]
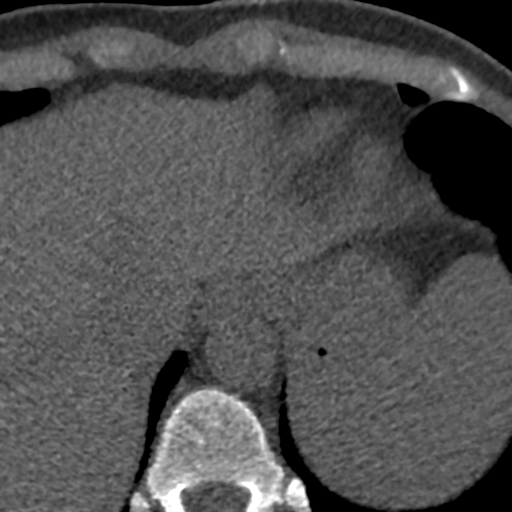
[im 5/38  lung]
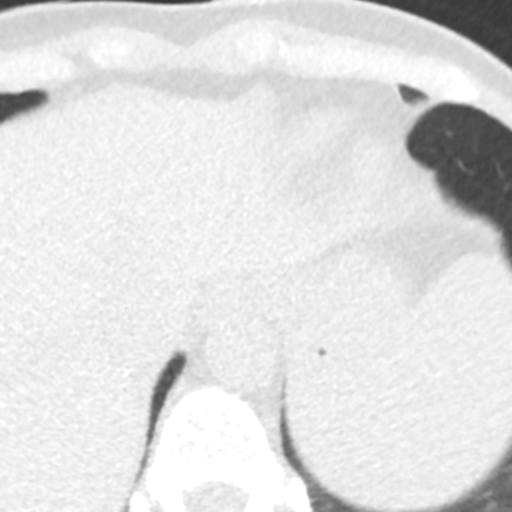
[im 9/38  vessel]
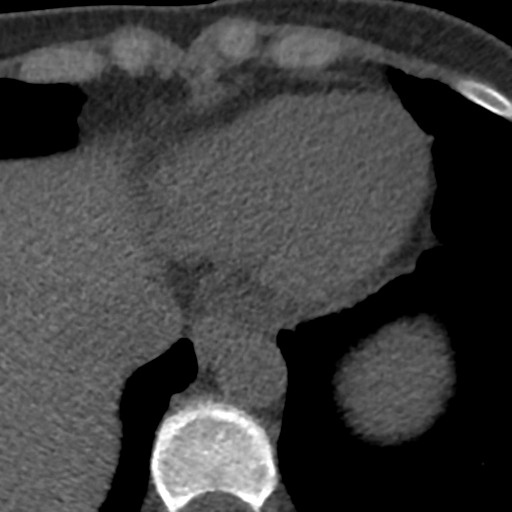
[im 13/38  vessel]
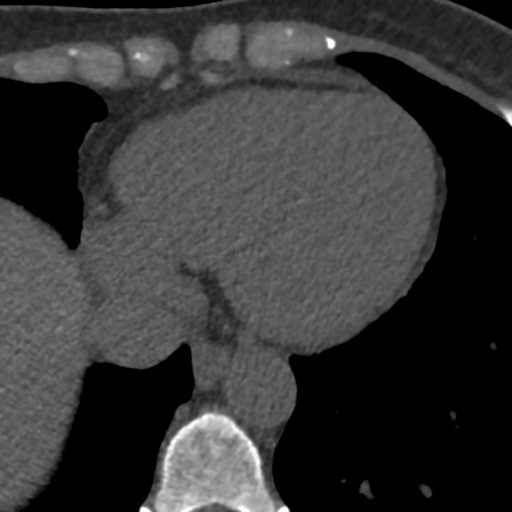
[im 17/38  vessel]
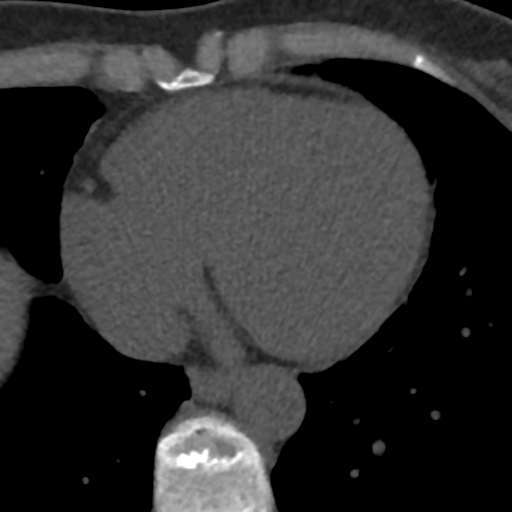
[im 21/38  vessel]
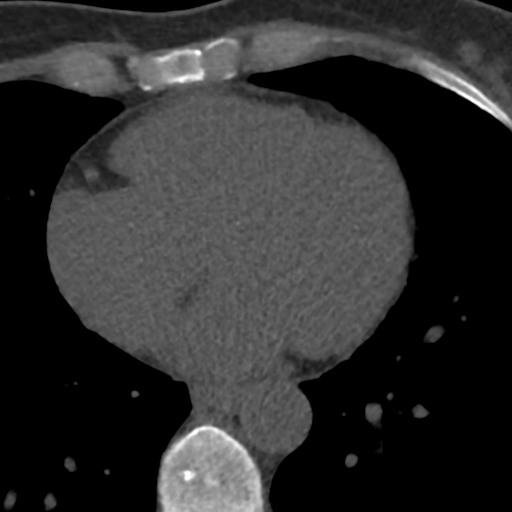
[im 21/38  lung]
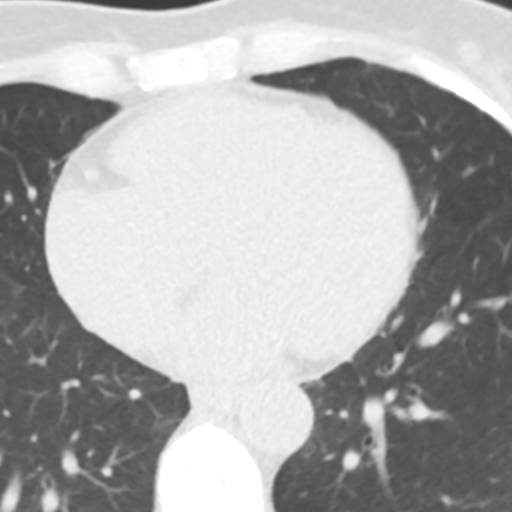
[im 25/38  vessel]
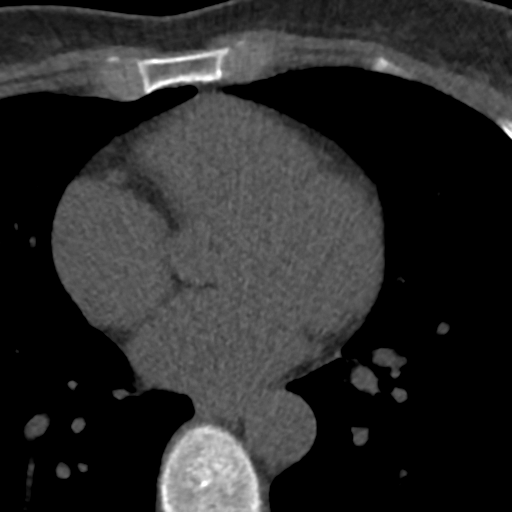
[im 29/38  vessel]
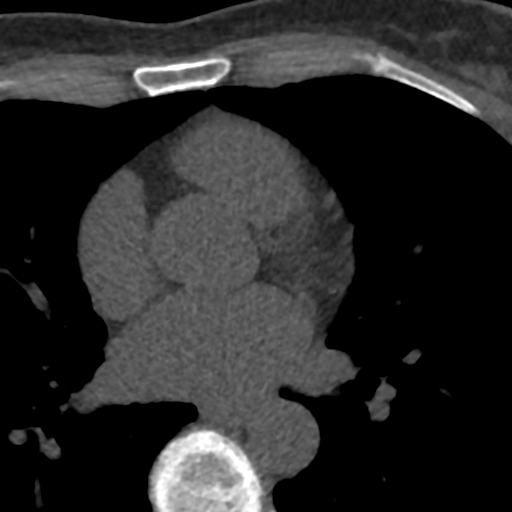
[im 33/38  vessel]
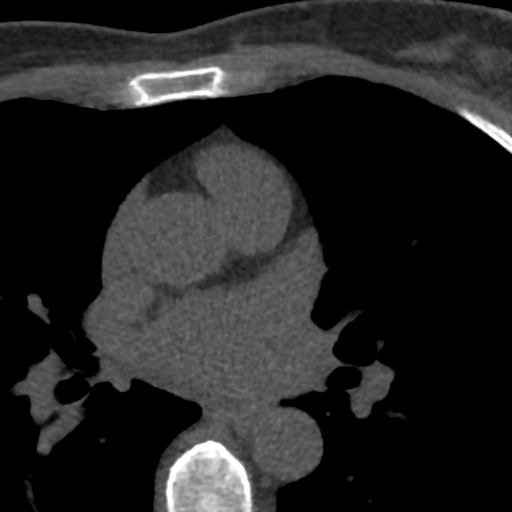

[Series 4: lung st 70 % · axial · 0.54mm/px · z∈[-214,-130]mm · 8 of 38 slices shown]
[im 5/38  lung]
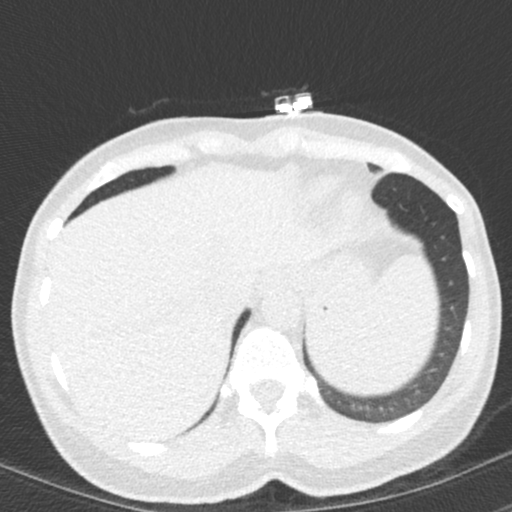
[im 9/38  lung]
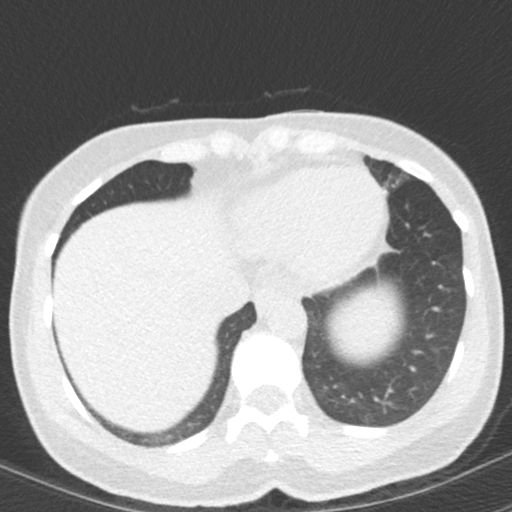
[im 13/38  lung]
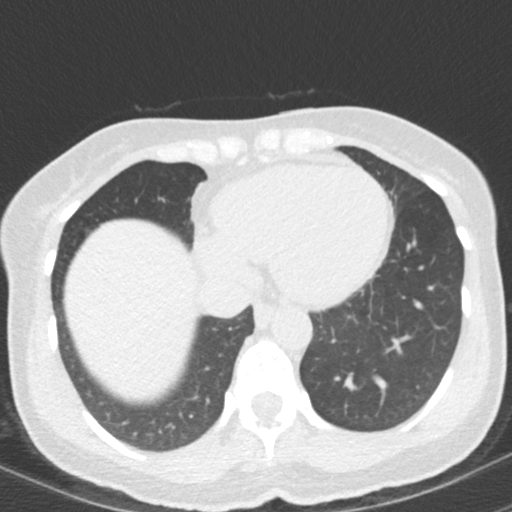
[im 17/38  lung]
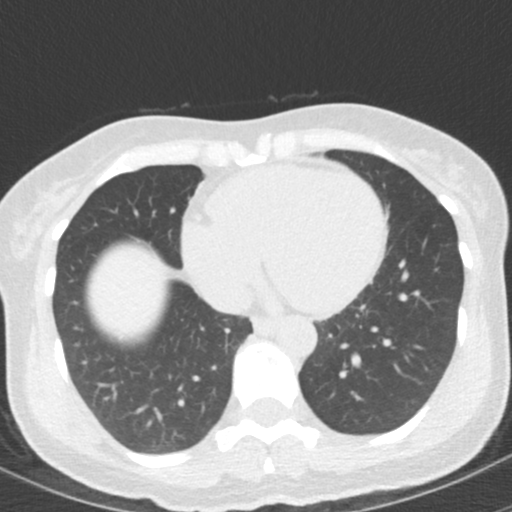
[im 21/38  lung]
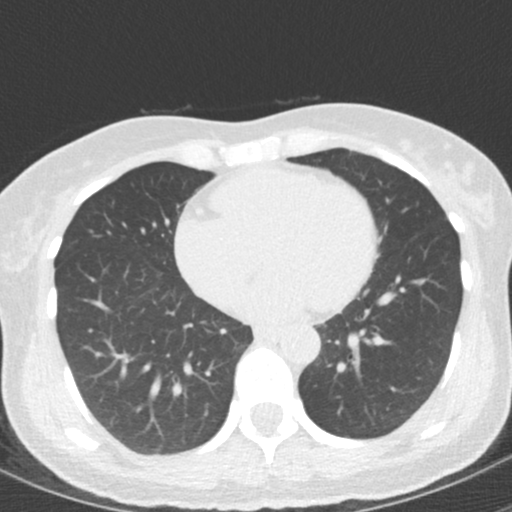
[im 25/38  lung]
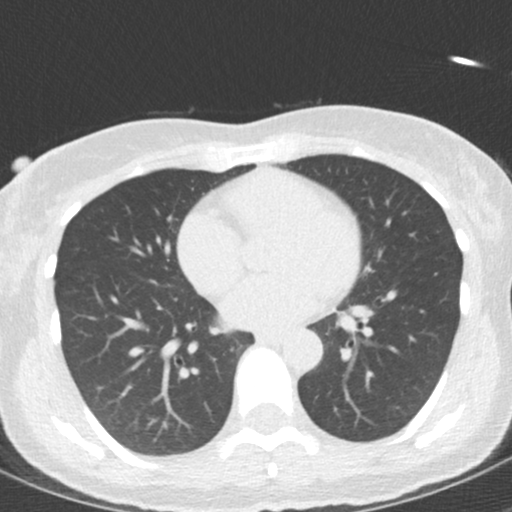
[im 29/38  lung]
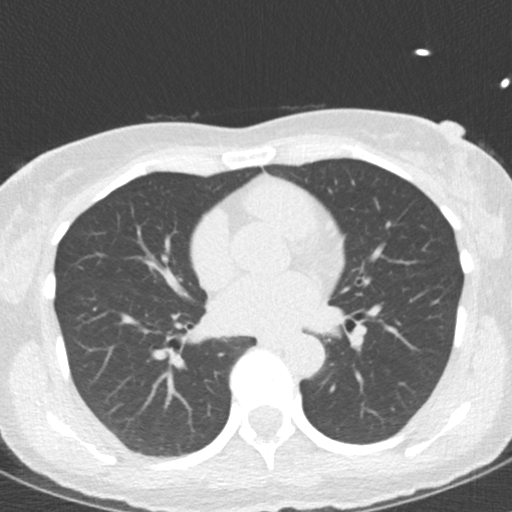
[im 33/38  lung]
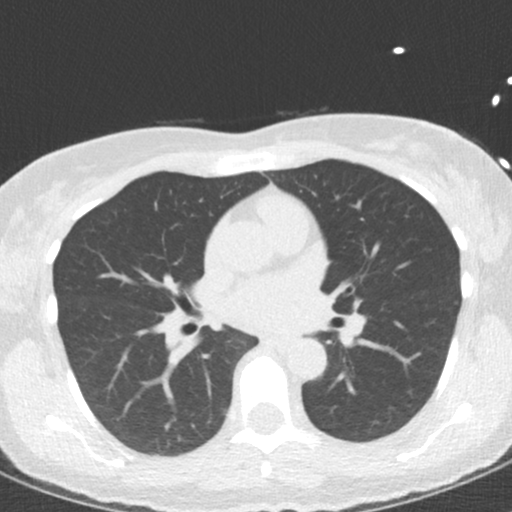

[16 of 20 positions shown; findings below may reference images not displayed]

FINDINGS: Non-cardiac: See separate report from [REDACTED].

Ascending Aorta:  Normal size, no calcifications.

Pericardium: Normal.

Coronary arteries:  Normal origin, right dominance.
IMPRESSION: Coronary calcium score of 0. This was 0 percentile for age and sex
matched control.

ANDERSON

EXAM:
OVER-READ INTERPRETATION  CT CHEST

The following report is an over-read performed by radiologist Dr.
over-read does not include interpretation of cardiac or coronary
anatomy or pathology. The coronary calcium score interpretation by
the cardiologist is attached.
FINDINGS: Within the visualized portions of the thorax there are no suspicious
appearing pulmonary nodules or masses, there is no acute
consolidative airspace disease, no pleural effusions, no
pneumothorax and no lymphadenopathy. For tiny calcified granulomas
in the inferior segment of the lingula and in the right lower lobe
incidentally noted. Visualized portions of the upper abdomen are
unremarkable. There are no aggressive appearing lytic or blastic
lesions noted in the visualized portions of the skeleton.
IMPRESSION: 1. No significant incidental noncardiac findings noted.

## 2015-04-05 NOTE — Assessment & Plan Note (Signed)
LEFT: Gave her green foam insoles with left metatarsal pad size small. She will wear this for couple weeks and see if it's improving symptoms. Return to clinic after that. It has been over years and she's had an injection so we could consider that.

## 2015-04-05 NOTE — Progress Notes (Signed)
Patient ID: Amanda Skinner, female   DOB: [birth year], 55 y.o.   MRN: [id number]  Amanda Skinner - 55 y.o. female MRN [id number]  Date of birth: 25-Dec-1960    SUBJECTIVE:     Chief complaint left foot pain Pain has been ongoing over the last 2 years. She has had injections in seen multiple physicians. Most recently she placed a pad in her shoe which has seemed to help some.. No history of injury. Has been diagnosed with Morton's neuroma.   Pain is in the left forefoot, much worse with standing. Resolves eventually with rest. Aching and burning with some associated numbness down to the second and third toe. ROS:     No unusual weight change. Has noted no swelling or erythema of ankles or feet. Has had no fevers.  PERTINENT  PMH / PSH FH / / SH:  Past Medical, Surgical, Social, and Family History Reviewed & Updated in the EMR.  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  OBJECTIVE: BP 110/38 mmHg  Ht '5\' 1"'$  (1.549 m)  Wt 119 lb (53.978 kg)  BMI 22.50 kg/m2  Physical Exam:  Vital signs are reviewed. GEN.: Well-developed thin female no acute distress ANKLES: Full range of motion plantarflexion, dorsiflexion, eversion and inversion FEET: Bilaterally cavus foot. Significant loss of transverse arch most notable in the second and third interspace of both feet. She's tender to palpation on the plantar surface over the metatarsal heads 2 and 3. Positive squeeze test. SKIN: Very small amount of callus on the ball of each foot. There are no lesions. VASCULAR: Dorsalis pedis pulses are 2+ bilaterally equal  ASSESSMENT & PLAN:  See problem based charting & AVS for pt instructions.

## 2015-04-18 ENCOUNTER — Ambulatory Visit: Payer: 59

## 2015-04-23 ENCOUNTER — Ambulatory Visit
Admission: RE | Admit: 2015-04-23 | Discharge: 2015-04-23 | Disposition: A | Discharge status: Home / Self Care | Payer: 59 | Source: Ambulatory Visit | Attending: General Surgery | Admitting: General Surgery

## 2015-04-23 ENCOUNTER — Other Ambulatory Visit: Payer: Self-pay | Admitting: General Surgery

## 2015-04-23 DIAGNOSIS — Z1231 Encounter for screening mammogram for malignant neoplasm of breast: Secondary | ICD-10-CM | POA: Insufficient documentation

## 2015-04-23 IMAGING — MG MM SCREENING BREAST TOMO BILATERAL
9 of 12 series · 9 of 28 positions shown · non-contrast
Comparison: Previous exam(s).

CLINICAL DATA: Screening.

EXAM:
DIGITAL SCREENING BILATERAL MAMMOGRAM WITH 3D TOMO WITH CAD

[R MLO]
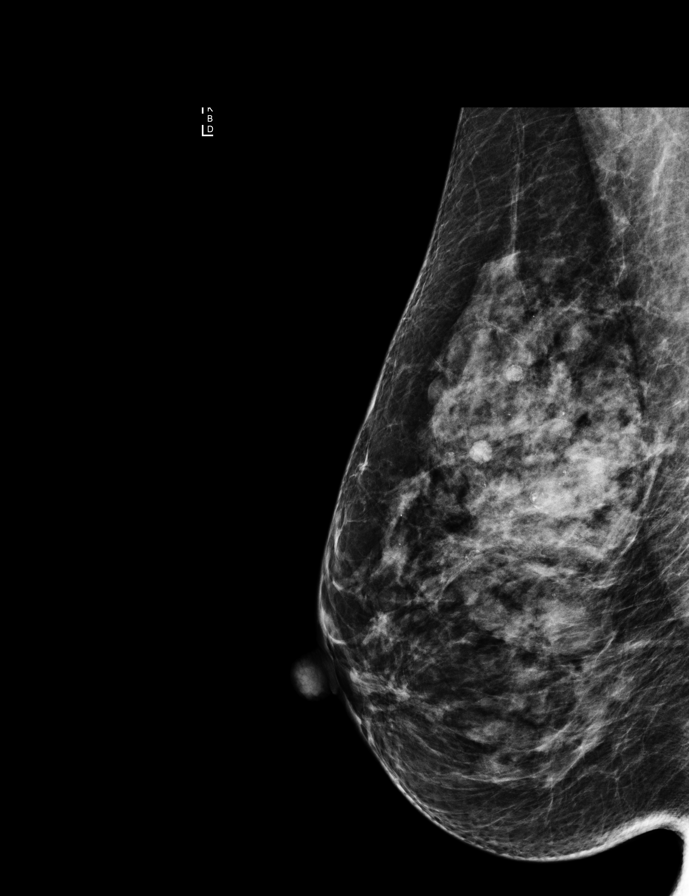

[R CC synth-2D]
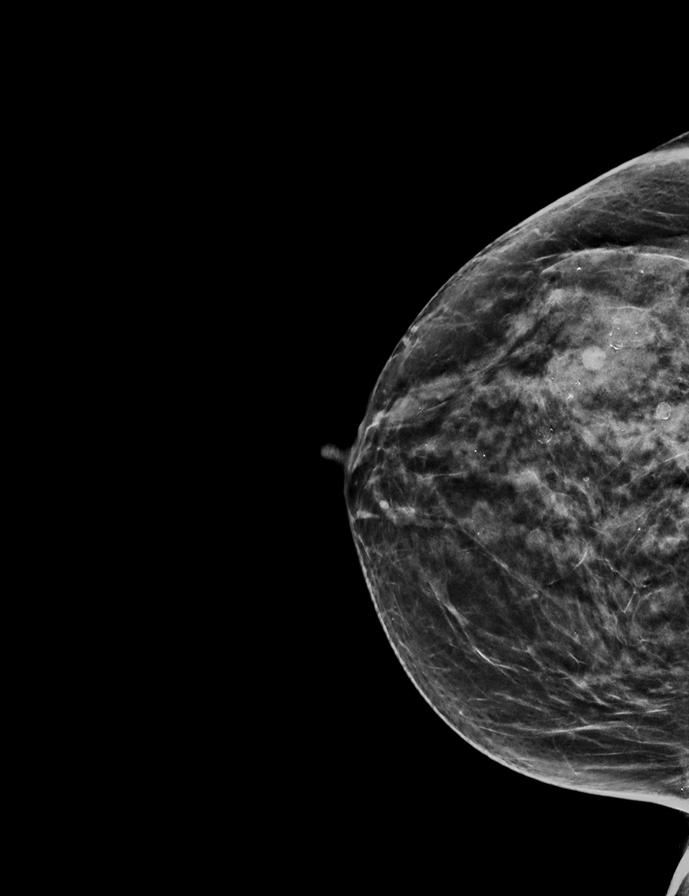

[L CC]
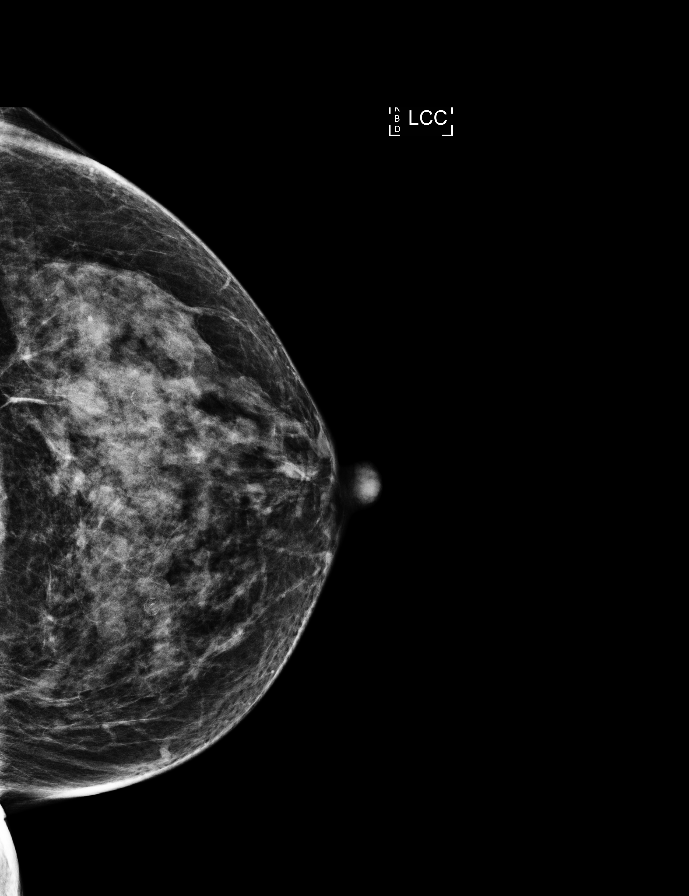

[R MLO synth-2D]
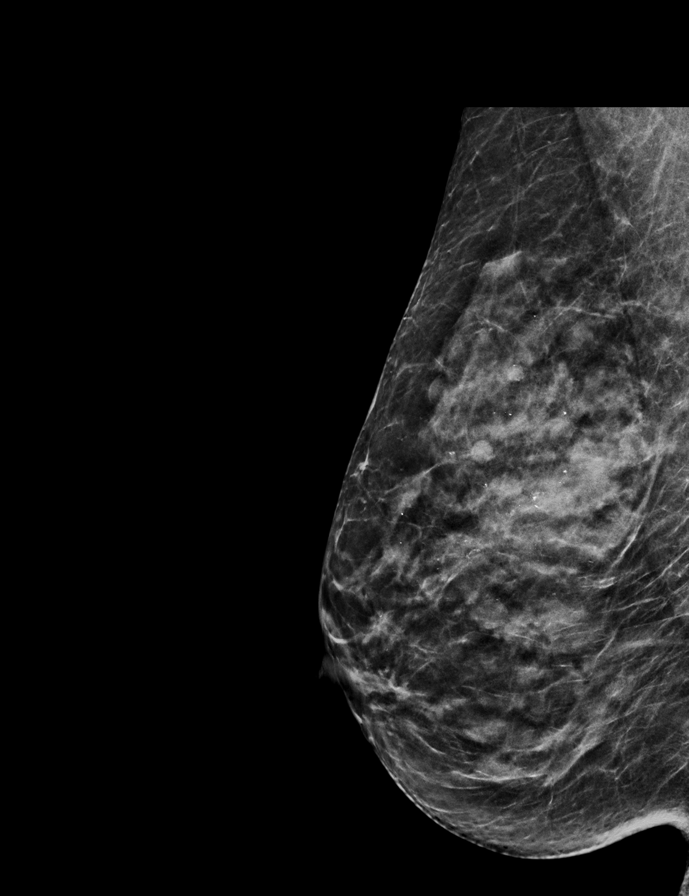

[L MLO]
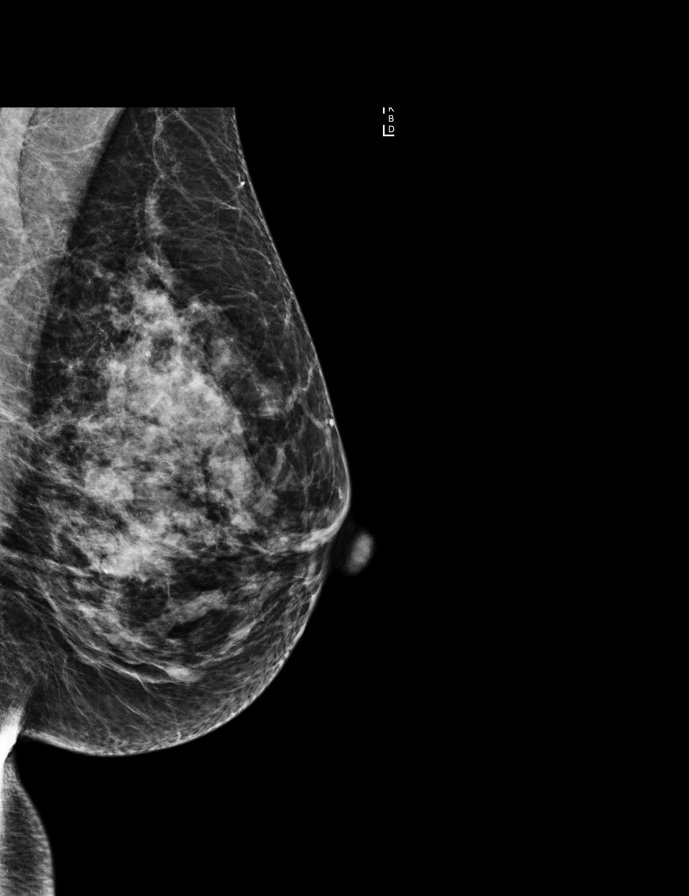

[L CC synth-2D]
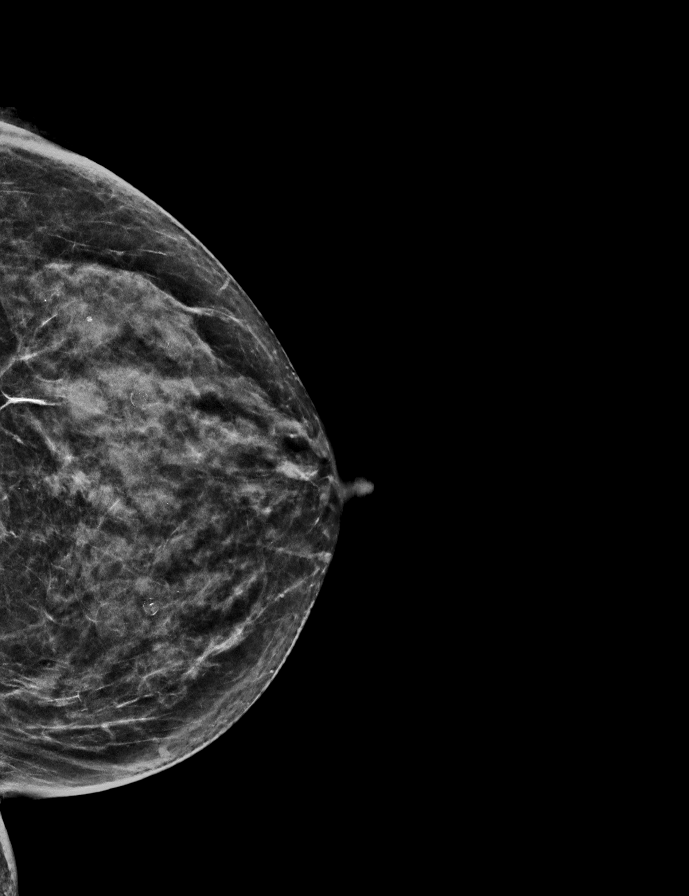

[R CC]
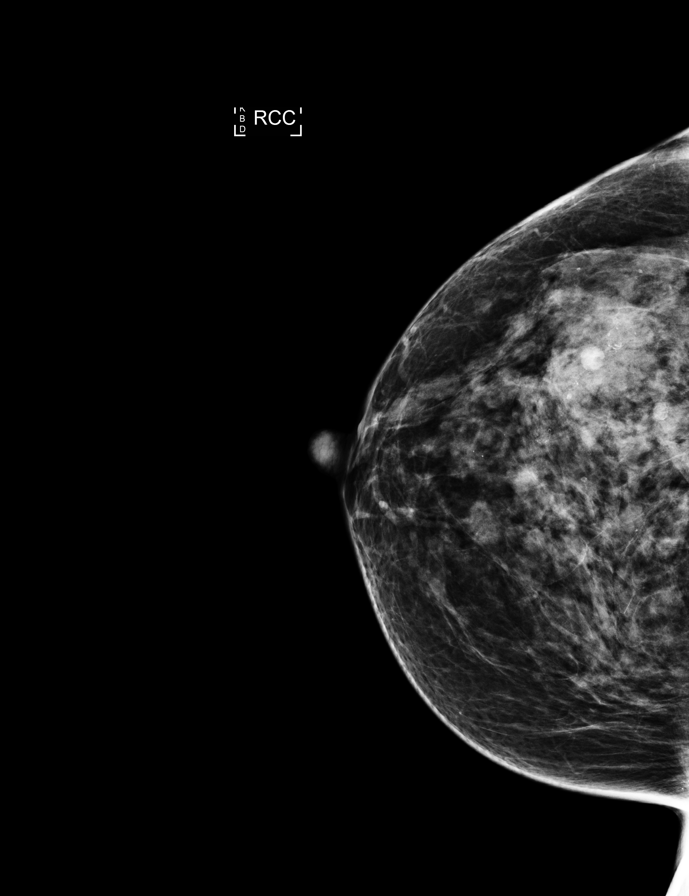

[L MLO synth-2D]
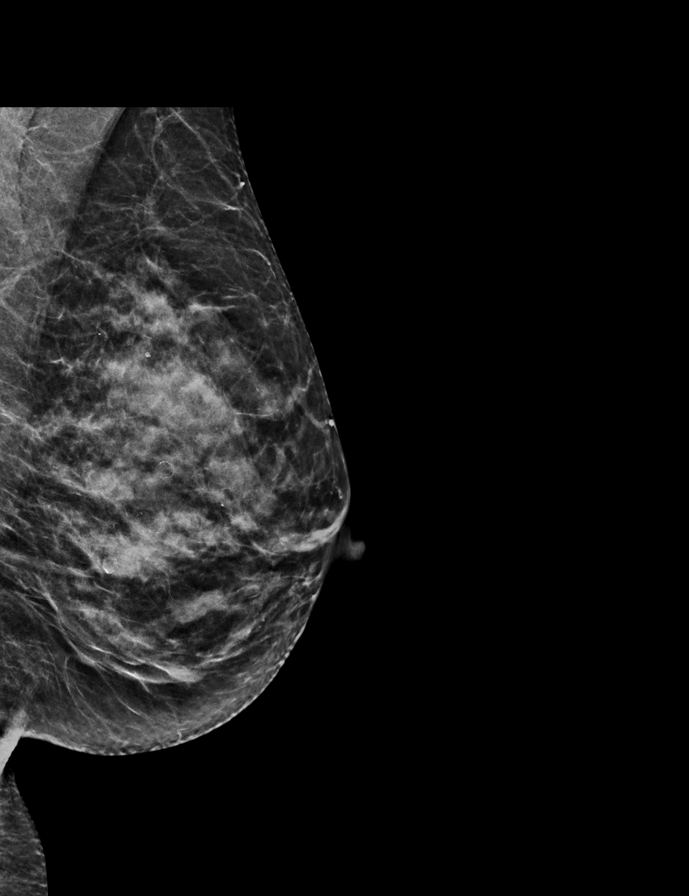

[L MLO tomo · tomo slice 25/50.0]
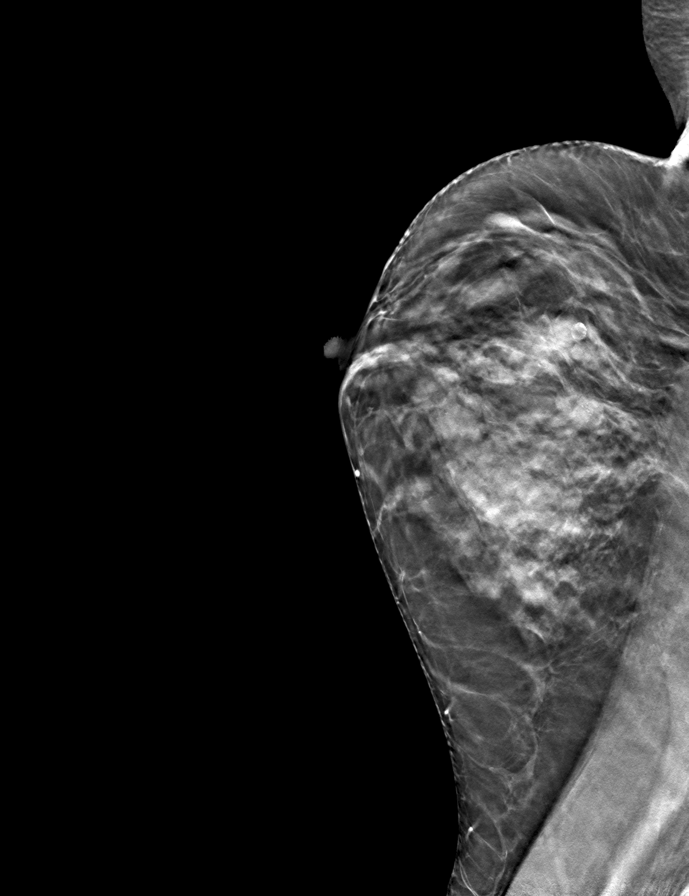

[9 of 28 positions shown; findings below may reference images not displayed]

ACR Breast Density Category c: The breast tissue is heterogeneously
dense, which may obscure small masses.
FINDINGS: There are no findings suspicious for malignancy. Images were
processed with CAD.
IMPRESSION: No mammographic evidence of malignancy. A result letter of this
screening mammogram will be mailed directly to the patient.

RECOMMENDATION:
Screening mammogram in one year. (Code:[SM])

BI-RADS CATEGORY  1: Negative.

## 2015-04-30 ENCOUNTER — Ambulatory Visit (INDEPENDENT_AMBULATORY_CARE_PROVIDER_SITE_OTHER): Payer: 59 | Admitting: General Surgery

## 2015-04-30 ENCOUNTER — Encounter: Payer: Self-pay | Admitting: General Surgery

## 2015-04-30 VITALS — BP 118/76 | HR 72 | Resp 12 | Ht 64.0 in | Wt 119.0 lb

## 2015-04-30 DIAGNOSIS — N6019 Diffuse cystic mastopathy of unspecified breast: Secondary | ICD-10-CM

## 2015-04-30 NOTE — Patient Instructions (Signed)
The patient has been asked to return to the office in one year with a bilateral screening mammogram. Continue self breast exams. Call office for any new breast issues or concerns.  

## 2015-04-30 NOTE — Progress Notes (Signed)
Patient ID: Amanda Skinner, female   DOB: August 15, 1960, 55 y.o.   MRN: [id number]  Chief Complaint  Patient presents with  . Follow-up    mammogram    HPI Amanda Skinner is a 55 y.o. female who presents for a breast evaluation. The most recent mammogram was done on 04/18/15.  Patient does perform regular self breast checks and gets regular mammograms done.  No new breast issues. 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 I have reviewed the history of present illness with the patient. HPI  Past Medical History  Diagnosis Date  . TMJ (dislocation of temporomandibular joint) 2012  . Asthma 2001  . Ulcer   . Diffuse cystic mastopathy 2012    left    Past Surgical History  Procedure Laterality Date  . Rhinoplasty  1996  . Cesarean section  1991  . Tubal ligation  1991  . Tonsilloadenoidectomy   1978  . Lower extremity surgery   2001  . Foot surgery Left 10/2011  . Colonoscopy  June 2015    [name]  . Breast cyst aspiration Bilateral   . Breast biopsy Right     Benign    Family History  Problem Relation Age of Onset  . Heart attack Mother   . Heart disease Mother   . Heart disease Maternal Grandmother     Social History Social History  Substance Use Topics  . Smoking status: Never Smoker   . Smokeless tobacco: Never Used  . Alcohol Use: No    Allergies  Allergen Reactions  . Etodolac Other (See Comments)    Reaction:  Migraines     Current Outpatient Prescriptions  Medication Sig Dispense Refill  . albuterol (PROVENTIL HFA;VENTOLIN HFA) 108 (90 BASE) MCG/ACT inhaler Inhale 2 puffs into the lungs every 6 (six) hours as needed for wheezing or shortness of breath. 2 Inhaler 0  . cetirizine (ZYRTEC) 10 MG tablet Take 10 mg by mouth daily.    . Cholecalciferol (VITAMIN D3) 400 UNITS CHEW Chew 800 Units by mouth daily.    Marland Kitchen estradiol (ESTRACE) 0.1 MG/GM vaginal cream Place 1 Applicatorful vaginally 2 (two) times a week.    . fluticasone (FLONASE) 50 MCG/ACT nasal  spray Place 2 sprays into both nostrils as needed for allergies or rhinitis. 16 g 6  . ibuprofen (ADVIL,MOTRIN) 600 MG tablet Take 600 mg by mouth every 8 (eight) hours as needed.    . naproxen sodium (ANAPROX) 220 MG tablet Take 220 mg by mouth as needed.    Marland Kitchen omeprazole (PRILOSEC) 20 MG capsule Take 1 capsule (20 mg total) by mouth daily. 30 capsule 3   No current facility-administered medications for this visit.    Review of Systems Review of Systems  Constitutional: Negative.   Respiratory: Negative.   Cardiovascular: Negative.     Blood pressure 118/76, pulse 72, resp. rate 12, height '5\' 4"'$  (1.626 m), weight 119 lb (53.978 kg).  Physical Exam Physical Exam  Constitutional: She is oriented to person, place, and time. She appears well-developed and well-nourished.  Eyes: Conjunctivae are normal. No scleral icterus.  Neck: Neck supple.  Cardiovascular: Normal rate, regular rhythm and normal heart sounds.   Pulmonary/Chest: Effort normal and breath sounds normal. Right breast exhibits no inverted nipple, no mass, no nipple discharge, no skin change and no tenderness. Left breast exhibits no inverted nipple, no mass, no nipple discharge, no skin change and no tenderness.    Abdominal: Soft. Bowel sounds are normal. There is no tenderness.  Lymphadenopathy:    She has no cervical adenopathy.    She has no axillary adenopathy.  Neurological: She is alert and oriented to person, place, and time.  Skin: Skin is warm and dry.    Data Reviewed Mammogram reviewed  Assessment    Stable physical exam. FCD with multiple cyst aspirations in past    Plan    The patient has been asked to return to the office in one year with a bilateral screening mammogram.  PCP:  Leone [facility]  This information has been scribed by Gaspar Cola CMA.        SANKAR,SEEPLAPUTHUR G 04/30/2015, 4:43 PM

## 2015-05-03 ENCOUNTER — Ambulatory Visit: Payer: Self-pay | Admitting: Physician Assistant

## 2015-05-03 ENCOUNTER — Encounter: Payer: Self-pay | Admitting: Physician Assistant

## 2015-05-03 VITALS — BP 110/70 | Temp 97.9°F

## 2015-05-03 DIAGNOSIS — J029 Acute pharyngitis, unspecified: Secondary | ICD-10-CM

## 2015-05-03 LAB — POCT RAPID STREP A (OFFICE): Rapid Strep A Screen: NEGATIVE

## 2015-05-03 MED ORDER — AZITHROMYCIN 250 MG PO TABS: ORAL_TABLET | ORAL | Status: DC

## 2015-05-03 NOTE — Progress Notes (Signed)
S: c/o sore throat for 1 week, no fever/chills/body aches, no cough or congestion, just has gotten worse over last couple of days and is on call this weekend  O: vitals wnl, nad, ENT wnl posterior pharynx is just mildly irritated, neck supple no lymph, lungs c t a, cv rrr, q strep neg  A: acute pharyngitis  P: zpack

## 2015-05-29 ENCOUNTER — Ambulatory Visit: Payer: Self-pay | Admitting: Family Medicine

## 2015-06-03 ENCOUNTER — Ambulatory Visit (INDEPENDENT_AMBULATORY_CARE_PROVIDER_SITE_OTHER): Payer: 59 | Admitting: Family Medicine

## 2015-06-03 VITALS — BP 114/72 | HR 74 | Temp 98.1°F | Wt 122.2 lb

## 2015-06-03 DIAGNOSIS — G5762 Lesion of plantar nerve, left lower limb: Secondary | ICD-10-CM

## 2015-06-03 DIAGNOSIS — M797 Fibromyalgia: Secondary | ICD-10-CM | POA: Diagnosis not present

## 2015-06-03 DIAGNOSIS — B359 Dermatophytosis, unspecified: Secondary | ICD-10-CM

## 2015-06-03 MED ORDER — NORTRIPTYLINE HCL 25 MG PO CAPS: 25.0000 mg | ORAL_CAPSULE | Freq: Every day | ORAL | Status: DC

## 2015-06-03 NOTE — Progress Notes (Signed)
Pre visit review using our clinic review tool, if applicable. No additional management support is needed unless otherwise documented below in the visit note. 

## 2015-06-03 NOTE — Patient Instructions (Signed)
Nice to see you. We'll start you on nortriptyline for your pain. Your pain could be related to fibromyalgia. You should use Lamisil on the area on her left forearm. If you develop chest pain, shortness of breath, cough productive of blood, sweatiness, or any new or changing symptoms please seek medical attention.

## 2015-06-03 NOTE — Progress Notes (Signed)
Patient ID: Amanda Skinner, female   DOB: Jan 02, 1961, 55 y.o.   MRN: [id number]  Tommi Rumps, MD Phone: [phone]  Amanda Skinner is a 55 y.o. female who presents today for follow-up.  Bilateral rib pain: Patient notes this has gotten worse. Intensity is increased to about 6 out of 10. Frequency is increasing as well. Typically lower ribs bilaterally anterior and posterior and sometimes upper ribs anteriorly. Does not worsen with physical activity. Feels occasionally swollen and sore to touch. Occasionally sharp pain. No shortness of breath or diaphoresis. No chest pressure. Has had significant cardiac workup that has been negative. Has tried Aleve that is not helpful.  Morton neuroma: This has not been an issue since her last visit. She followed up with sports medicine they gave her an insole that has been significantly helpful.  Left forearm ringworm: This has been there a couple weeks. No itching. No spreading rash. Not worsening.   PMH: nonsmoker.   ROS see history of present illness  Objective  Physical Exam Filed Vitals:   06/03/15 1503  BP: 114/72  Pulse: 74  Temp: 98.1 F (36.7 C)    BP Readings from Last 3 Encounters:  06/03/15 114/72  05/03/15 110/70  04/30/15 118/76   Wt Readings from Last 3 Encounters:  06/03/15 122 lb 3.2 oz (55.43 kg)  04/30/15 119 lb (53.978 kg)  04/05/15 119 lb (53.978 kg)    Physical Exam  Constitutional: She is well-developed, well-nourished, and in no distress.  HENT:  Head: Normocephalic and atraumatic.  Cardiovascular: Normal rate, regular rhythm and normal heart sounds.   Pulmonary/Chest: Effort normal and breath sounds normal. No respiratory distress. She has no wheezes. She has no rales.  Musculoskeletal:  No midline spine tenderness, no midline spine step-off, bilateral paraspinous thoracic muscular tenderness with particular trigger points, bilateral anterior and lateral lateral rib tenderness that particular  trigger points, no skin changes or swelling or bony defects  Neurological: She is alert. Gait normal.  5/5 strength in bilateral biceps, triceps, grip, quads, hamstrings, plantar and dorsiflexion, sensation to light touch intact in bilateral UE and LE, normal gait, 2+ patellar reflexes  Skin: She is not diaphoretic.  Dime-sized area of rash consistent with ringworm on left forearm     Assessment/Plan: Please see individual problem list.  Ringworm Rash consistent with ringworm. Advised on over-the-counter Lamisil cream.  Morton's metatarsalgia, neuralgia, or neuroma Not currently bothering the patient. She will continue to monitor.  Fibromyalgia Patient with discomfort in bilateral ribs and back with specific trigger points bringing in the possibility of fibromyalgia as a cause. She's had a significant cardiac workup that has been negative. Her vital signs are stable and she has no history of VTE making this an unlikely cause. Pulmonary exam is normal and oxygenation is normal making it unlikely that it's a pulmonary process. Suspect some type of musculoskeletal cause with fibromyalgia as a possibility given her trigger points. Discussed this with the patient. Discussed treatment options with the patient. We'll trial her on nortriptyline. She will continue to monitor. She's given return precautions.    No orders of the defined types were placed in this encounter.    Meds ordered this encounter  Medications  . nortriptyline (PAMELOR) 25 MG capsule    Sig: Take 1 capsule (25 mg total) by mouth at bedtime.    Dispense:  90 capsule    Refill:  1   Tommi Rumps, MD [place]

## 2015-06-05 ENCOUNTER — Encounter: Payer: Self-pay | Admitting: Family Medicine

## 2015-06-05 DIAGNOSIS — B359 Dermatophytosis, unspecified: Secondary | ICD-10-CM | POA: Insufficient documentation

## 2015-06-05 DIAGNOSIS — M797 Fibromyalgia: Secondary | ICD-10-CM | POA: Insufficient documentation

## 2015-06-05 NOTE — Assessment & Plan Note (Signed)
Not currently bothering the patient. She will continue to monitor.

## 2015-06-05 NOTE — Assessment & Plan Note (Signed)
Rash consistent with ringworm. Advised on over-the-counter Lamisil cream.

## 2015-06-05 NOTE — Assessment & Plan Note (Addendum)
Patient with discomfort in bilateral ribs and back with specific trigger points bringing in the possibility of fibromyalgia as a cause. She's had a significant cardiac workup that has been negative. Her vital signs are stable and she has no history of VTE making this an unlikely cause. Pulmonary exam is normal and oxygenation is normal making it unlikely that it's a pulmonary process. Suspect some type of musculoskeletal cause with fibromyalgia as a possibility given her trigger points. Discussed this with the patient. Discussed treatment options with the patient. We'll trial her on nortriptyline. She will continue to monitor. She's given return precautions.

## 2015-06-17 ENCOUNTER — Ambulatory Visit: Payer: Self-pay | Admitting: Physician Assistant

## 2015-06-17 ENCOUNTER — Encounter: Payer: Self-pay | Admitting: Physician Assistant

## 2015-06-17 VITALS — BP 108/80 | HR 82 | Temp 97.9°F

## 2015-06-17 DIAGNOSIS — L259 Unspecified contact dermatitis, unspecified cause: Secondary | ICD-10-CM

## 2015-06-17 MED ORDER — DEXAMETHASONE SODIUM PHOSPHATE 10 MG/ML IJ SOLN
10.0000 mg | Freq: Once | INTRAMUSCULAR | Status: AC
  Administered 2015-06-17: 10 mg via INTRAMUSCULAR

## 2015-06-17 NOTE — Progress Notes (Signed)
S: c/o itchy rash on hands, arms, legs, was outside in yard and then broke out, sx for few days, tried multiple otc meds without relief, denies fever/chills  O: vitals wnl, nad, lungs c t a, cv rrr, skin with small raised red areas some with streaks/blisters, no drainage, n/v intact  A: acute contact dermatitis  P: decadron '10mg'$  IM

## 2015-07-01 ENCOUNTER — Encounter: Payer: Self-pay | Admitting: Family Medicine

## 2015-07-01 ENCOUNTER — Ambulatory Visit (INDEPENDENT_AMBULATORY_CARE_PROVIDER_SITE_OTHER): Payer: 59 | Admitting: Family Medicine

## 2015-07-01 VITALS — BP 110/70 | HR 69 | Temp 98.0°F | Resp 18 | Ht 60.0 in | Wt 119.4 lb

## 2015-07-01 DIAGNOSIS — B359 Dermatophytosis, unspecified: Secondary | ICD-10-CM | POA: Diagnosis not present

## 2015-07-01 DIAGNOSIS — M797 Fibromyalgia: Secondary | ICD-10-CM

## 2015-07-01 NOTE — Patient Instructions (Signed)
Nice to see you you. Please continue to monitor her pain. If this worsens again please let us know. Please continue to treat ringworm with the Lamisil and if it does not improve over the next month please let us know. If you develop chest pain, shortness of breath, or any new or changing symptoms please seek medical attention.

## 2015-07-01 NOTE — Assessment & Plan Note (Signed)
Patient's symptoms most likely related to fibromyalgia given specific trigger points. She had negative cardiac workup. Vital signs remained stable. She did not tolerate the nortriptyline. She does not want to try any additional medications at this time. She'll continue to monitor. If worsens she will let us know. Given return precautions.

## 2015-07-01 NOTE — Progress Notes (Signed)
Patient ID: Amanda Skinner, female   DOB: 1960/04/03, 55 y.o.   MRN: [id number]  Amanda Rumps, MD Phone: [phone]  Amanda Skinner is a 55 y.o. female who presents today for follow-up.  Bilateral rib pain: Patient notes this is gotten quite a bit better since her last visit. She tried the nortriptyline once this made her drowsy and feel foggy. She's not taken this since then. She notes 2-3 times a day she gets a discomfort in her ribs that last for about 3-5 minutes and goes away on its own. There is no associated shortness of breath or diaphoresis. She notes this does not occur in the same place every time. At this point she does not want to try any medications.  Ringworm: This is improving. The area on her left forearm is drying up. She's been using Lamisil.  PMH: nonsmoker.   ROS see history of present illness  Objective  Physical Exam Filed Vitals:   07/01/15 1108  BP: 110/70  Pulse: 69  Temp: 98 F (36.7 C)  Resp: 18    BP Readings from Last 3 Encounters:  07/01/15 110/70  06/17/15 108/80  06/03/15 114/72   Wt Readings from Last 3 Encounters:  07/01/15 119 lb 6 oz (54.148 kg)  06/03/15 122 lb 3.2 oz (55.43 kg)  04/30/15 119 lb (53.978 kg)    Physical Exam  Constitutional: She is well-developed, well-nourished, and in no distress.  Cardiovascular: Normal rate, regular rhythm and normal heart sounds.   Pulmonary/Chest: Effort normal and breath sounds normal.  Ribs are nontender  Neurological: She is alert. Gait normal.  Skin: Skin is warm and dry. She is not diaphoretic.  Ulnar aspect of the left forearm with small area consistent with ringworm it is not erythematous and is drying up     Assessment/Plan: Please see individual problem list.  Fibromyalgia Patient's symptoms most likely related to fibromyalgia given specific trigger points. She had negative cardiac workup. Vital signs remained stable. She did not tolerate the nortriptyline. She does  not want to try any additional medications at this time. She'll continue to monitor. If worsens she will let us know. Given return precautions.  Ringworm Improving. Continue Lamisil.    Amanda Rumps, MD [place]

## 2015-07-01 NOTE — Assessment & Plan Note (Signed)
Improving. Continue Lamisil.

## 2015-07-01 NOTE — Progress Notes (Signed)
Pre-visit discussion using our clinic review tool. No additional management support is needed unless otherwise documented below in the visit note.  

## 2015-07-31 ENCOUNTER — Encounter: Payer: Self-pay | Admitting: *Deleted

## 2015-08-01 ENCOUNTER — Ambulatory Visit (INDEPENDENT_AMBULATORY_CARE_PROVIDER_SITE_OTHER): Payer: 59 | Admitting: General Surgery

## 2015-08-01 ENCOUNTER — Encounter: Payer: Self-pay | Admitting: General Surgery

## 2015-08-01 ENCOUNTER — Ambulatory Visit: Payer: 59 | Admitting: General Surgery

## 2015-08-01 VITALS — BP 110/66 | HR 80 | Resp 12 | Ht 60.0 in | Wt 117.0 lb

## 2015-08-01 DIAGNOSIS — N6019 Diffuse cystic mastopathy of unspecified breast: Secondary | ICD-10-CM

## 2015-08-01 NOTE — Patient Instructions (Signed)
Patient to return as scheduled.

## 2015-08-01 NOTE — Progress Notes (Signed)
Patient ID: Amanda Skinner, female   DOB: 1960-08-28, 55 y.o.   MRN: [id number]  Chief Complaint  Patient presents with  . Breast Problem    mass    HPI Amanda Skinner is a 55 y.o. female.  Here today for evaluation of a right breast lump.She states she noticed this area about a week ago, but could not find lump this morning.  No tenderness or nipple discharge noted, just wanted this area checked out.  I have reviewed the history of present illness with the patient.   HPI  Past Medical History  Diagnosis Date  . TMJ (dislocation of temporomandibular joint) 2012  . Asthma 2001  . Ulcer   . Diffuse cystic mastopathy 2012    left    Past Surgical History  Procedure Laterality Date  . Rhinoplasty  1996  . Cesarean section  1991  . Tubal ligation  1991  . Tonsilloadenoidectomy   1978  . Lower extremity surgery   2001  . Foot surgery Left 10/2011  . Colonoscopy  June 2015    [name]  . Breast cyst aspiration Bilateral   . Breast biopsy Right     Benign    Family History  Problem Relation Age of Onset  . Heart attack Mother   . Heart disease Mother   . Heart disease Maternal Grandmother     Social History Social History  Substance Use Topics  . Smoking status: Never Smoker   . Smokeless tobacco: Never Used  . Alcohol Use: No    Allergies  Allergen Reactions  . Etodolac Other (See Comments)    Reaction:  Migraines     Current Outpatient Prescriptions  Medication Sig Dispense Refill  . albuterol (PROVENTIL HFA;VENTOLIN HFA) 108 (90 BASE) MCG/ACT inhaler Inhale 2 puffs into the lungs every 6 (six) hours as needed for wheezing or shortness of breath. 2 Inhaler 0  . cetirizine (ZYRTEC) 10 MG tablet Take 10 mg by mouth daily.    . Cholecalciferol (VITAMIN D3) 400 UNITS CHEW Chew 800 Units by mouth daily. Reported on 06/17/2015    . estradiol (ESTRACE) 0.1 MG/GM vaginal cream Place 1 Applicatorful vaginally 2 (two) times a week.    . fluticasone (FLONASE) 50  MCG/ACT nasal spray Place 2 sprays into both nostrils as needed for allergies or rhinitis. 16 g 6  . naproxen sodium (ANAPROX) 220 MG tablet Take 220 mg by mouth as needed. Reported on 05/03/2015    . ibuprofen (ADVIL,MOTRIN) 600 MG tablet Take 600 mg by mouth every 8 (eight) hours as needed. Reported on 08/01/2015    . omeprazole (PRILOSEC) 20 MG capsule Take 1 capsule (20 mg total) by mouth daily. (Patient not taking: Reported on 08/01/2015) 30 capsule 3   No current facility-administered medications for this visit.    Review of Systems Review of Systems  Constitutional: Negative.   Respiratory: Negative.   Cardiovascular: Negative.     Blood pressure 110/66, pulse 80, resp. rate 12, height 5' (1.524 m), weight 117 lb (53.071 kg).  Physical Exam Physical Exam  Constitutional: She is oriented to person, place, and time. She appears well-developed and well-nourished.  HENT:  Mouth/Throat: Oropharynx is clear and moist.  Eyes: Conjunctivae are normal. No scleral icterus.  Neck: Neck supple.  Pulmonary/Chest: Right breast exhibits no inverted nipple, no mass, no nipple discharge, no skin change and no tenderness.  Lymphadenopathy:    She has no cervical adenopathy.  Neurological: She is alert and oriented to  person, place, and time.  Skin: Skin is warm and dry.  Psychiatric: Her behavior is normal.    Data Reviewed None  Assessment    Physical exam is benign and stable at this time.     Plan    Patient is to continue self-breast exams. Follow-up as annually for mammograms and office breast exams as needed.  Patient is agreeable to plan.   PCP:  Tommi Rumps  This information has been scribed by Karie Fetch RN, BSN,BC.    Colson Barco G 08/01/2015, 12:00 PM

## 2015-08-12 ENCOUNTER — Encounter: Payer: Self-pay | Admitting: Physician Assistant

## 2015-08-12 ENCOUNTER — Ambulatory Visit: Payer: Self-pay | Admitting: Physician Assistant

## 2015-08-12 VITALS — BP 130/90 | Temp 98.2°F

## 2015-08-12 DIAGNOSIS — W57XXXA Bitten or stung by nonvenomous insect and other nonvenomous arthropods, initial encounter: Secondary | ICD-10-CM

## 2015-08-12 MED ORDER — DOXYCYCLINE HYCLATE 100 MG PO TABS: 100.0000 mg | ORAL_TABLET | Freq: Two times a day (BID) | ORAL | Status: DC

## 2015-08-12 NOTE — Progress Notes (Signed)
S: c/o tick bite x 3, now is achey and tired, has joint pain and fatigue, no fever/chills, rash, hx of same when had lymes  O: vitals wnl, nad, lungs c t a, cv rrr, muscles in upper back spasmed, no rashes noted, joints a little tender, n/v intact  A: tick bite, joint pain  P: doxy '100mg'$  bid x 10d, otc turmeric, f/u with pcp, ?if needs to be tested for RA

## 2015-08-28 ENCOUNTER — Ambulatory Visit: Payer: Self-pay | Admitting: Physician Assistant

## 2015-08-28 ENCOUNTER — Encounter: Payer: Self-pay | Admitting: Physician Assistant

## 2015-08-28 VITALS — BP 100/70 | HR 80 | Temp 98.5°F

## 2015-08-28 DIAGNOSIS — L259 Unspecified contact dermatitis, unspecified cause: Secondary | ICD-10-CM

## 2015-08-28 MED ORDER — PREDNISONE 10 MG (48) PO TBPK: ORAL_TABLET | Freq: Every day | ORAL | Status: DC

## 2015-08-28 MED ORDER — DEXAMETHASONE SODIUM PHOSPHATE 10 MG/ML IJ SOLN
10.0000 mg | Freq: Once | INTRAMUSCULAR | Status: AC
  Administered 2015-08-28: 10 mg via INTRAMUSCULAR

## 2015-08-28 NOTE — Progress Notes (Signed)
S: c/o itchy rash on hands, arms, legs, was outside in yard and then broke out, sx for few days, tried multiple otc meds without relief, denies fever/chills  O: vitals wnl, nad, lungs c t a, cv rrr, skin with small raised red areas some with streaks/blisters, no drainage, n/v intact  A: acute contact dermatitis  P: decadron 10 mg im, if not improving in 3 days, use sterapred ds '10mg'$  12 d dose pack

## 2015-11-26 ENCOUNTER — Ambulatory Visit: Payer: Self-pay | Admitting: Physician Assistant

## 2015-11-26 ENCOUNTER — Encounter: Payer: Self-pay | Admitting: Physician Assistant

## 2015-11-26 VITALS — BP 110/80 | HR 60 | Temp 98.5°F

## 2015-11-26 DIAGNOSIS — M545 Low back pain, unspecified: Secondary | ICD-10-CM

## 2015-11-26 DIAGNOSIS — R300 Dysuria: Secondary | ICD-10-CM

## 2015-11-26 LAB — POCT URINALYSIS DIPSTICK
BILIRUBIN UA: NEGATIVE
Glucose, UA: NEGATIVE
KETONES UA: NEGATIVE
Leukocytes, UA: NEGATIVE
Nitrite, UA: NEGATIVE
PH UA: 7
Protein, UA: NEGATIVE
Spec Grav, UA: 1.015
Urobilinogen, UA: 0.2

## 2015-11-26 NOTE — Progress Notes (Signed)
S:  C/o low back pain for 1 day, no known injury, feels like when she had a bad kidney infection, states it doesn't feel like her normal muscle type pain; denies numbness, tingling, or changes in bowel/urinary habits,  Using otc meds without relief Remainder ros neg  O:  Vitals wnl, nad, lungs c t a, cv rrr, spine nontender, muscles in lower back spasmed ,  Neg slr, pt walks without difficulty, no foot drop noted, n/v intact ua wnl  A: acute back pain,   P: use wet heat followed by ice, stretches, return to clinic if not better in 3 t 5 days, return earlier if worsening, pt extremely worried its a kidney infection, is to drop off first urine of day tomorrow morning, if still negative can do culture

## 2015-11-27 ENCOUNTER — Other Ambulatory Visit
Admission: RE | Admit: 2015-11-27 | Discharge: 2015-11-27 | Disposition: A | Discharge status: Home / Self Care | Payer: 59 | Source: Ambulatory Visit | Attending: Physician Assistant | Admitting: Physician Assistant

## 2015-11-27 DIAGNOSIS — M549 Dorsalgia, unspecified: Secondary | ICD-10-CM | POA: Insufficient documentation

## 2015-11-28 LAB — URINE CULTURE

## 2015-12-03 ENCOUNTER — Other Ambulatory Visit
Admission: RE | Admit: 2015-12-03 | Discharge: 2015-12-03 | Disposition: A | Discharge status: Home / Self Care | Payer: 59 | Source: Ambulatory Visit | Attending: Physician Assistant | Admitting: Physician Assistant

## 2015-12-03 DIAGNOSIS — R300 Dysuria: Secondary | ICD-10-CM | POA: Diagnosis not present

## 2015-12-03 NOTE — Progress Notes (Signed)
Spoke with patient yesterday. Came into office this morning picked up lab requistion

## 2015-12-04 LAB — URINE CULTURE: CULTURE: NO GROWTH

## 2015-12-31 DIAGNOSIS — L570 Actinic keratosis: Secondary | ICD-10-CM | POA: Diagnosis not present

## 2015-12-31 DIAGNOSIS — Z1283 Encounter for screening for malignant neoplasm of skin: Secondary | ICD-10-CM | POA: Diagnosis not present

## 2015-12-31 DIAGNOSIS — D229 Melanocytic nevi, unspecified: Secondary | ICD-10-CM | POA: Diagnosis not present

## 2015-12-31 DIAGNOSIS — B359 Dermatophytosis, unspecified: Secondary | ICD-10-CM | POA: Diagnosis not present

## 2015-12-31 DIAGNOSIS — L578 Other skin changes due to chronic exposure to nonionizing radiation: Secondary | ICD-10-CM | POA: Diagnosis not present

## 2015-12-31 DIAGNOSIS — B078 Other viral warts: Secondary | ICD-10-CM | POA: Diagnosis not present

## 2015-12-31 DIAGNOSIS — L814 Other melanin hyperpigmentation: Secondary | ICD-10-CM | POA: Diagnosis not present

## 2015-12-31 DIAGNOSIS — D1800 Hemangioma unspecified site: Secondary | ICD-10-CM | POA: Diagnosis not present

## 2015-12-31 DIAGNOSIS — R202 Paresthesia of skin: Secondary | ICD-10-CM | POA: Diagnosis not present

## 2016-01-01 ENCOUNTER — Ambulatory Visit (INDEPENDENT_AMBULATORY_CARE_PROVIDER_SITE_OTHER): Payer: 59 | Admitting: Family Medicine

## 2016-01-01 ENCOUNTER — Encounter: Payer: Self-pay | Admitting: Family Medicine

## 2016-01-01 DIAGNOSIS — J4520 Mild intermittent asthma, uncomplicated: Secondary | ICD-10-CM

## 2016-01-01 DIAGNOSIS — M797 Fibromyalgia: Secondary | ICD-10-CM

## 2016-01-01 DIAGNOSIS — K219 Gastro-esophageal reflux disease without esophagitis: Secondary | ICD-10-CM

## 2016-01-01 DIAGNOSIS — J309 Allergic rhinitis, unspecified: Secondary | ICD-10-CM | POA: Diagnosis not present

## 2016-01-01 DIAGNOSIS — J3089 Other allergic rhinitis: Secondary | ICD-10-CM

## 2016-01-01 MED ORDER — FLUTICASONE PROPIONATE 50 MCG/ACT NA SUSP: 2.0000 | NASAL | 6 refills | Status: DC | PRN

## 2016-01-01 NOTE — Assessment & Plan Note (Signed)
Asymptomatic currently. No longer taking Prilosec. She'll monitor for recurrence.

## 2016-01-01 NOTE — Assessment & Plan Note (Signed)
Asymptomatic currently. Much improved. She'll continue to monitor.

## 2016-01-01 NOTE — Patient Instructions (Signed)
Nice to see you. Please monitor for recurrence of your rib pain and reflux. If you need a refill on her albuterol please let us know. We'll have you come back in 3 months for a physical exam.

## 2016-01-01 NOTE — Progress Notes (Signed)
  Amanda Rumps, MD Phone: [phone]  Amanda Skinner is a 55 y.o. female who presents today for follow-up.  Rib pain: Thought to be related to fibromyalgia. It has improved quite a bit. She reports it is 98% better. Rarely gets symptoms. No chest pain, shortness of breath, or abdominal pain. Rarely takes Aleve for this.  Asthma: Almost never has symptoms. Can't remember the last time she used her inhaler. No shortness of breath or wheezing.  GERD: No issues with this currently. Not currently taking Prilosec. No burning, sour taste, abdominal pain, or blood in her stool.  PMH: nonsmoker.   ROS see history of present illness  Objective  Physical Exam Vitals:   01/01/16 1534  BP: 98/66  Pulse: 79  Temp: 98.4 F (36.9 C)    BP Readings from Last 3 Encounters:  01/01/16 98/66  11/26/15 110/80  08/28/15 100/70   Wt Readings from Last 3 Encounters:  01/01/16 118 lb 6.4 oz (53.7 kg)  08/01/15 117 lb (53.1 kg)  07/01/15 119 lb 6 oz (54.1 kg)    Physical Exam  Constitutional: She is well-developed, well-nourished, and in no distress.  Cardiovascular: Normal rate, regular rhythm and normal heart sounds.   Pulmonary/Chest: Effort normal and breath sounds normal.  Abdominal: Soft. Bowel sounds are normal. She exhibits no distension. There is no tenderness. There is no rebound and no guarding.  Musculoskeletal: She exhibits no edema.  Neurological: She is alert. Gait normal.  Skin: Skin is warm and dry.     Assessment/Plan: Please see individual problem list.  Allergic rhinitis Flonase refilled.  Mild intermittent asthma No recent symptoms. She'll let us know when she needs a refill of albuterol.  Fibromyalgia Asymptomatic currently. Much improved. She'll continue to monitor.  GERD (gastroesophageal reflux disease) Asymptomatic currently. No longer taking Prilosec. She'll monitor for recurrence.   No orders of the defined types were placed in this  encounter.   Meds ordered this encounter  Medications  . fluticasone (FLONASE) 50 MCG/ACT nasal spray    Sig: Place 2 sprays into both nostrils as needed for allergies or rhinitis.    Dispense:  16 g    Refill:  [place], MD [place]

## 2016-01-01 NOTE — Progress Notes (Signed)
Pre visit review using our clinic review tool, if applicable. No additional management support is needed unless otherwise documented below in the visit note. 

## 2016-01-01 NOTE — Assessment & Plan Note (Signed)
No recent symptoms. She'll let us know when she needs a refill of albuterol.

## 2016-01-01 NOTE — Assessment & Plan Note (Signed)
Flonase refilled.

## 2016-01-24 DIAGNOSIS — H5211 Myopia, right eye: Secondary | ICD-10-CM | POA: Diagnosis not present

## 2016-01-24 DIAGNOSIS — H5202 Hypermetropia, left eye: Secondary | ICD-10-CM | POA: Diagnosis not present

## 2016-01-24 DIAGNOSIS — H524 Presbyopia: Secondary | ICD-10-CM | POA: Diagnosis not present

## 2016-02-03 ENCOUNTER — Other Ambulatory Visit: Payer: Self-pay

## 2016-02-03 DIAGNOSIS — Z1231 Encounter for screening mammogram for malignant neoplasm of breast: Secondary | ICD-10-CM

## 2016-02-28 DIAGNOSIS — Z79899 Other long term (current) drug therapy: Secondary | ICD-10-CM | POA: Diagnosis not present

## 2016-02-28 DIAGNOSIS — B351 Tinea unguium: Secondary | ICD-10-CM | POA: Diagnosis not present

## 2016-03-17 DIAGNOSIS — Z79899 Other long term (current) drug therapy: Secondary | ICD-10-CM | POA: Diagnosis not present

## 2016-03-17 DIAGNOSIS — B351 Tinea unguium: Secondary | ICD-10-CM | POA: Diagnosis not present

## 2016-04-03 ENCOUNTER — Encounter: Payer: Self-pay | Admitting: Family Medicine

## 2016-04-03 ENCOUNTER — Ambulatory Visit (INDEPENDENT_AMBULATORY_CARE_PROVIDER_SITE_OTHER): Payer: 59 | Admitting: Family Medicine

## 2016-04-03 VITALS — BP 120/80 | HR 75 | Temp 97.9°F | Ht 60.0 in | Wt 118.0 lb

## 2016-04-03 DIAGNOSIS — Z0000 Encounter for general adult medical examination without abnormal findings: Secondary | ICD-10-CM

## 2016-04-03 DIAGNOSIS — Z0001 Encounter for general adult medical examination with abnormal findings: Secondary | ICD-10-CM | POA: Insufficient documentation

## 2016-04-03 DIAGNOSIS — Z1159 Encounter for screening for other viral diseases: Secondary | ICD-10-CM | POA: Diagnosis not present

## 2016-04-03 DIAGNOSIS — Z13220 Encounter for screening for lipoid disorders: Secondary | ICD-10-CM | POA: Diagnosis not present

## 2016-04-03 NOTE — Patient Instructions (Signed)
Nice to see you. Please get lab work at your convenience at the lab at the hospital. [facility] contact her with the results.

## 2016-04-03 NOTE — Assessment & Plan Note (Signed)
Physical exam completed. Encouraged continued good diet and exercise. Pelvic exams and Pap smears through her gynecologist. Mammograms managed through her surgeon given cystic breast disease. We will request records on her colonoscopy. Lab work as outlined below.

## 2016-04-03 NOTE — Progress Notes (Signed)
Pre visit review using our clinic review tool, if applicable. No additional management support is needed unless otherwise documented below in the visit note. 

## 2016-04-03 NOTE — Progress Notes (Signed)
Tommi Rumps, MD Phone: [phone]  Amanda Skinner is a 56 y.o. female who presents today for physical exam.  Diet is described as good and healthy. Watches what she eats. Exercises most days by walking, running, or riding a bike. Also lifts some weights. No prior hepatitis C testing. She does not think she's been tested for HIV. Gets her Pap smears through gynecology. She is postmenopausal. Reports colonoscopy 3 years ago that was normal. Mammograms through surgery and is scheduled in March. Did get a flu shot. She is unsure when she had her last tetanus shot was. Rare alcohol use. No tobacco or illicit drug use.  Active Ambulatory Problems    Diagnosis Date Noted  . Diffuse cystic mastopathy 05/11/2012  . Bilateral hand pain 10/08/2014  . Mild intermittent asthma 10/09/2014  . Allergic rhinitis 10/09/2014  . Musculoskeletal chest pain 01/29/2015  . Atypical chest pain 02/01/2015  . Family history of coronary artery disease 02/01/2015  . Morton's metatarsalgia, neuralgia, 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  . Ringworm 06/05/2015  . Fibromyalgia 06/05/2015  . GERD (gastroesophageal reflux disease) 01/01/2016  . Routine general medical examination at a health care facility 04/03/2016   Resolved Ambulatory Problems    Diagnosis Date Noted  . Unstable angina ([place]) 01/21/2015   Past Medical History:  Diagnosis Date  . Asthma 2001  . Diffuse cystic mastopathy 2012  . TMJ (dislocation of temporomandibular joint) 2012  . Ulcer ([place])     Family History  Problem Relation Age of Onset  . Heart attack Mother   . Heart disease Mother   . Heart disease Maternal Grandmother     Social History   Social History  . Marital status: Married    Spouse name: N/A  . Number of children: N/A  . Years of education: N/A   Occupational History  . Not on file.   Social History Main Topics  . Smoking status: Never Smoker  . Smokeless tobacco: Never Used  . Alcohol use No  . Drug use:  No  . Sexual activity: Not on file   Other Topics Concern  . Not on file   Social History Narrative  . No narrative on file    ROS  General:  Negative for nexplained weight loss, fever Skin: Negative for new or changing mole, sore that won't heal HEENT: Negative for trouble hearing, trouble seeing, ringing in ears, mouth sores, hoarseness, change in voice, dysphagia. CV:  Negative for chest pain, dyspnea, edema, palpitations Resp: Negative for cough, dyspnea, hemoptysis GI: Negative for nausea, vomiting, diarrhea, constipation, abdominal pain, melena, hematochezia. GU: Negative for dysuria, incontinence, urinary hesitance, hematuria, vaginal or penile discharge, polyuria, sexual difficulty, lumps in testicle or breasts MSK: Negative for muscle cramps or aches, joint pain or swelling Neuro: Negative for headaches, weakness, numbness, dizziness, passing out/fainting Psych: Negative for depression, anxiety, memory problems  Objective  Physical Exam Vitals:   04/03/16 1527  BP: 120/80  Pulse: 75  Temp: 97.9 F (36.6 C)    BP Readings from Last 3 Encounters:  04/03/16 120/80  01/01/16 98/66  11/26/15 110/80   Wt Readings from Last 3 Encounters:  04/03/16 118 lb (53.5 kg)  01/01/16 118 lb 6.4 oz (53.7 kg)  08/01/15 117 lb (53.1 kg)    Physical Exam  Constitutional: She is well-developed, well-nourished, and in no distress.  HENT:  Head: Normocephalic and atraumatic.  Mouth/Throat: Oropharynx is clear and moist. No oropharyngeal exudate.  Eyes: Conjunctivae are normal. Pupils are equal, round, and reactive  to light.  Neck: Neck supple.  Cardiovascular: Normal rate, regular rhythm and normal heart sounds.   Pulmonary/Chest: Effort normal and breath sounds normal.  Abdominal: Soft. Bowel sounds are normal. She exhibits no distension. There is no tenderness. There is no rebound and no guarding.  Musculoskeletal: She exhibits no edema.  Lymphadenopathy:    She has no  cervical adenopathy.  Neurological: She is alert. Gait normal.  Skin: Skin is warm and dry.  Psychiatric: Mood and affect normal.     Assessment/Plan:   Routine general medical examination at a health care facility Physical exam completed. Encouraged continued good diet and exercise. Pelvic exams and Pap smears through her gynecologist. Mammograms managed through her surgeon given cystic breast disease. We will request records on her colonoscopy. Lab work as outlined below.   Orders Placed This Encounter  Procedures  . Comp Met (CMET)    Standing Status:   Future    Standing Expiration Date:   04/03/2017  . Lipid Profile    Standing Status:   Future    Standing Expiration Date:   04/03/2017  . Hepatitis C Antibody    Standing Status:   Future    Standing Expiration Date:   04/03/2017    Tommi Rumps, MD [place]

## 2016-04-10 ENCOUNTER — Encounter: Payer: Self-pay | Admitting: Family Medicine

## 2016-04-10 ENCOUNTER — Other Ambulatory Visit
Admission: RE | Admit: 2016-04-10 | Discharge: 2016-04-10 | Disposition: A | Discharge status: Home / Self Care | Payer: 59 | Source: Ambulatory Visit | Attending: Family Medicine | Admitting: Family Medicine

## 2016-04-10 DIAGNOSIS — Z13220 Encounter for screening for lipoid disorders: Secondary | ICD-10-CM | POA: Insufficient documentation

## 2016-04-10 DIAGNOSIS — Z1159 Encounter for screening for other viral diseases: Secondary | ICD-10-CM | POA: Insufficient documentation

## 2016-04-10 LAB — COMPREHENSIVE METABOLIC PANEL
ALBUMIN: 4.7 g/dL (ref 3.5–5.0)
ALK PHOS: 72 U/L (ref 38–126)
ALT: 14 U/L (ref 14–54)
ANION GAP: 6 (ref 5–15)
AST: 19 U/L (ref 15–41)
BILIRUBIN TOTAL: 0.7 mg/dL (ref 0.3–1.2)
BUN: 13 mg/dL (ref 6–20)
CALCIUM: 9.6 mg/dL (ref 8.9–10.3)
CO2: 31 mmol/L (ref 22–32)
CREATININE: 1.05 mg/dL — AB (ref 0.44–1.00)
Chloride: 103 mmol/L (ref 101–111)
GFR calc non Af Amer: 58 mL/min — ABNORMAL LOW (ref >60)
GLUCOSE: 98 mg/dL (ref 65–99)
Potassium: 3.9 mmol/L (ref 3.5–5.1)
Sodium: 140 mmol/L (ref 135–145)
TOTAL PROTEIN: 7.3 g/dL (ref 6.5–8.1)

## 2016-04-10 LAB — LIPID PANEL
Cholesterol: 187 mg/dL (ref 0–200)
HDL: 81 mg/dL (ref >40)
LDL CALC: 99 mg/dL (ref 0–99)
LDL Cholesterol: 99 mg/dL
Total CHOL/HDL Ratio: 2.3 RATIO
Triglycerides: 35 mg/dL (ref <150)
VLDL: 7 mg/dL (ref 0–40)

## 2016-04-10 LAB — BASIC METABOLIC PANEL
BUN: 13 mg/dL (ref 4–21)
CREATININE: 1.1 mg/dL (ref 0.5–1.1)
GLUCOSE: 98 mg/dL
POTASSIUM: 3.9 mmol/L (ref 3.4–5.3)
SODIUM: 140 mmol/L (ref 137–147)

## 2016-04-11 LAB — HEPATITIS C ANTIBODY: HCV Ab: <0.1 s/co ratio (ref 0.0–0.9)

## 2016-04-13 ENCOUNTER — Telehealth: Payer: Self-pay | Admitting: *Deleted

## 2016-04-13 NOTE — Telephone Encounter (Signed)
Pt requested lab results  Pt contact [phone]

## 2016-04-13 NOTE — Telephone Encounter (Signed)
See result note.  

## 2016-04-16 DIAGNOSIS — M7581 Other shoulder lesions, right shoulder: Secondary | ICD-10-CM | POA: Diagnosis not present

## 2016-04-23 ENCOUNTER — Ambulatory Visit: Payer: 59

## 2016-04-24 ENCOUNTER — Ambulatory Visit
Admission: RE | Admit: 2016-04-24 | Discharge: 2016-04-24 | Disposition: A | Discharge status: Home / Self Care | Payer: 59 | Source: Ambulatory Visit | Attending: General Surgery | Admitting: General Surgery

## 2016-04-24 DIAGNOSIS — Z1231 Encounter for screening mammogram for malignant neoplasm of breast: Secondary | ICD-10-CM | POA: Insufficient documentation

## 2016-04-24 IMAGING — MG MM DIGITAL SCREENING BILAT W/ TOMO W/ CAD
9 of 14 series · 9 of 30 positions shown · non-contrast
Comparison: Previous exam(s).

CLINICAL DATA: Screening.

EXAM:
2D DIGITAL SCREENING BILATERAL MAMMOGRAM WITH CAD AND ADJUNCT TOMO

[L MLO (1 of 2)]
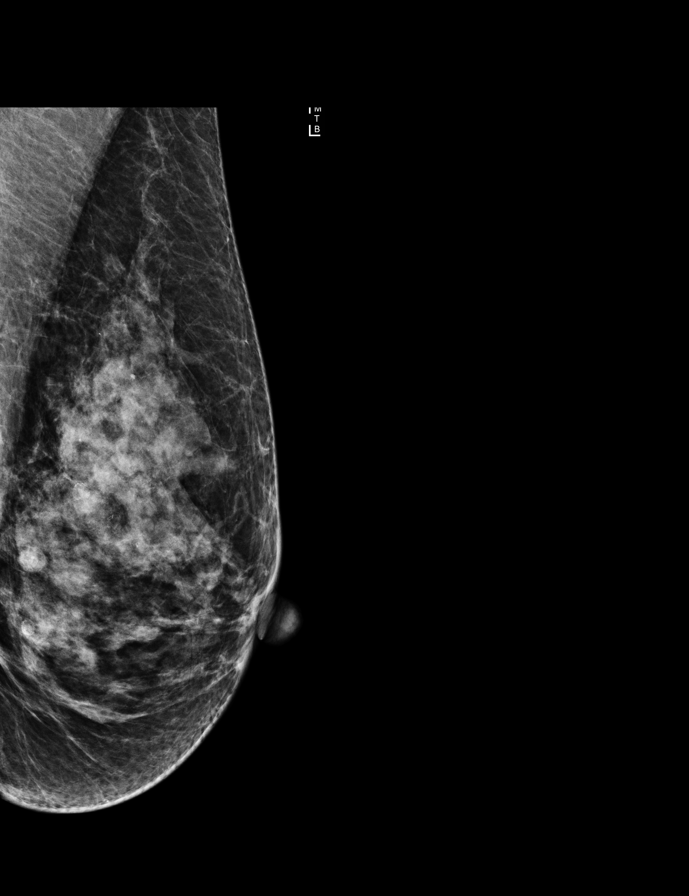

[R MLO]
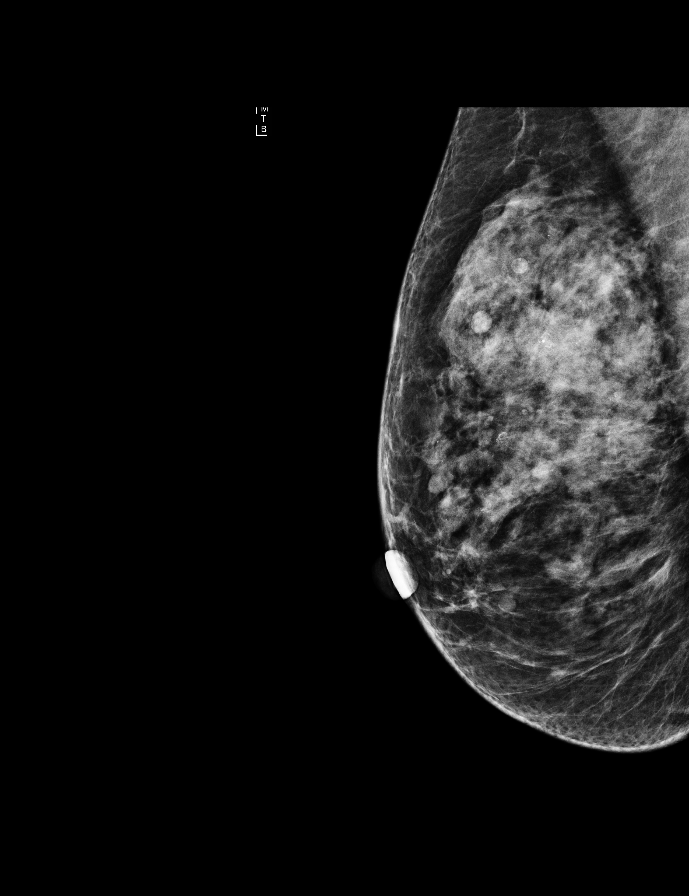

[R MLO synth-2D]
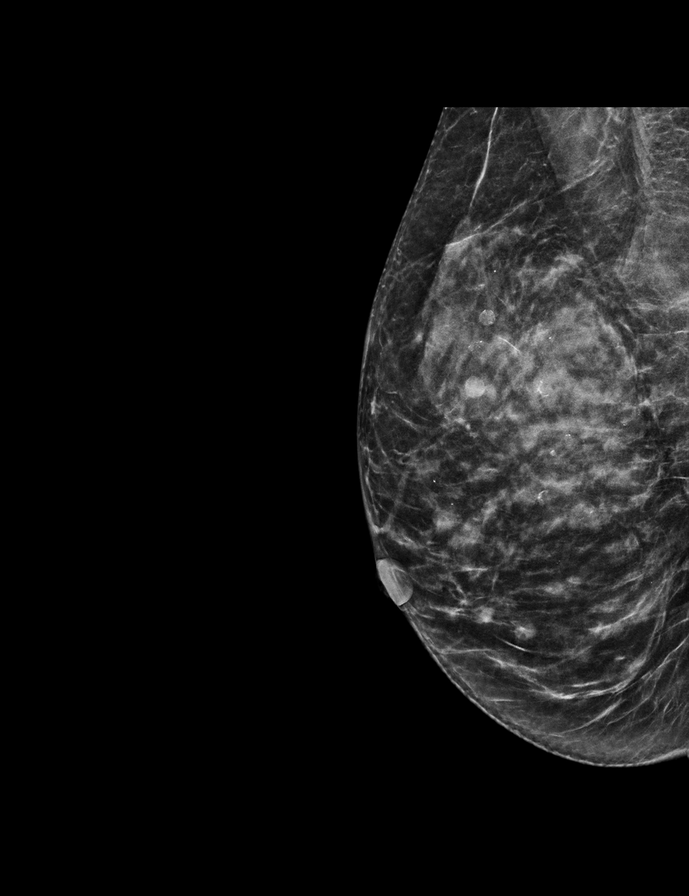

[L CC]
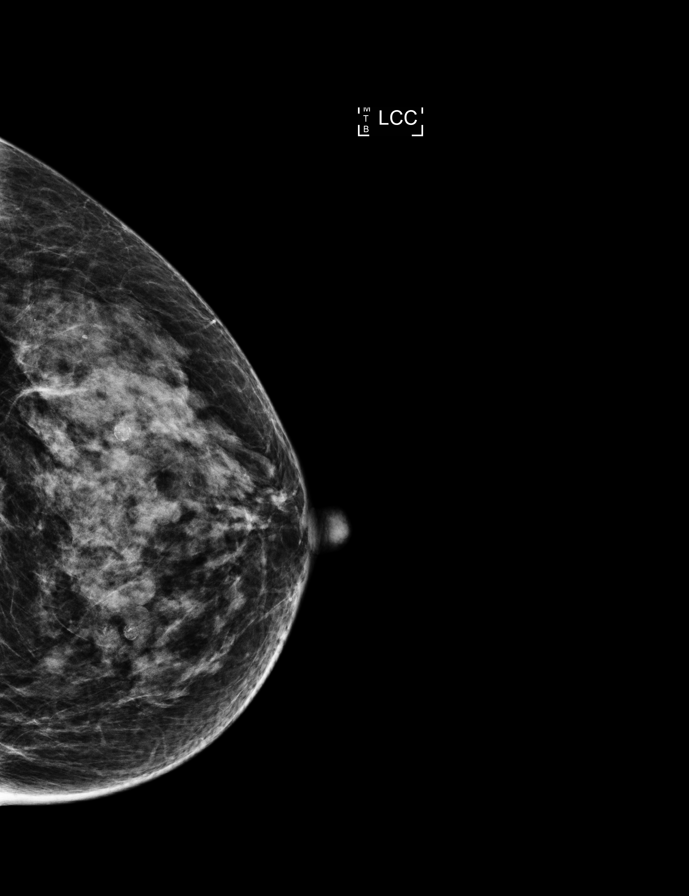

[R CC]
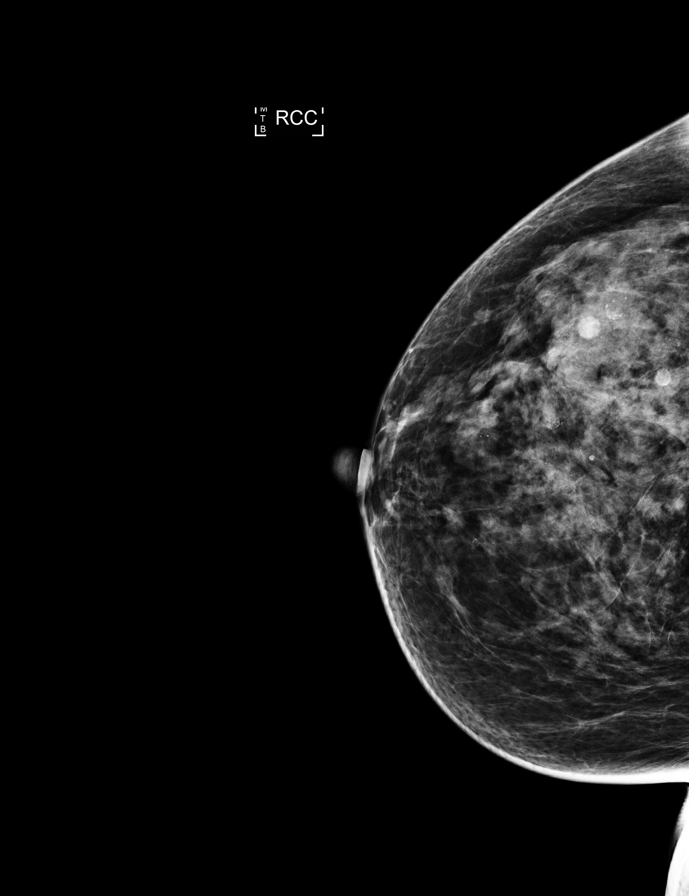

[L MLO synth-2D]
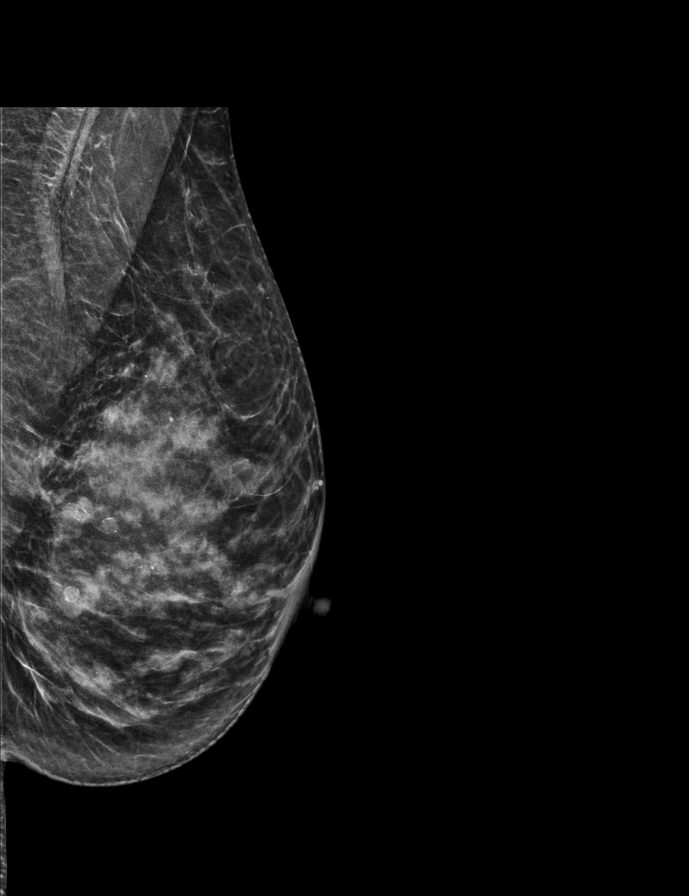

[R CC synth-2D]
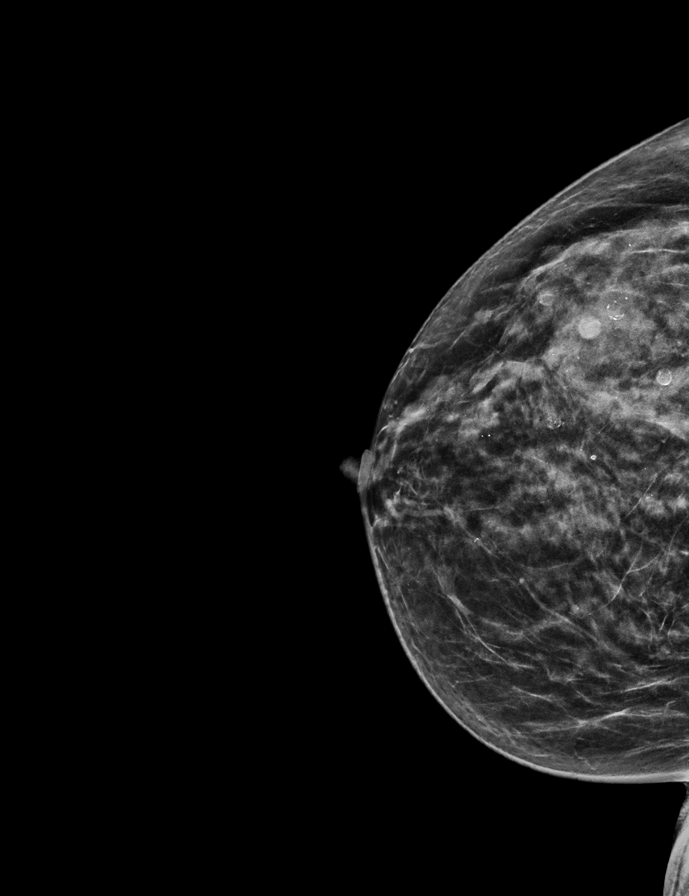

[L CC synth-2D]
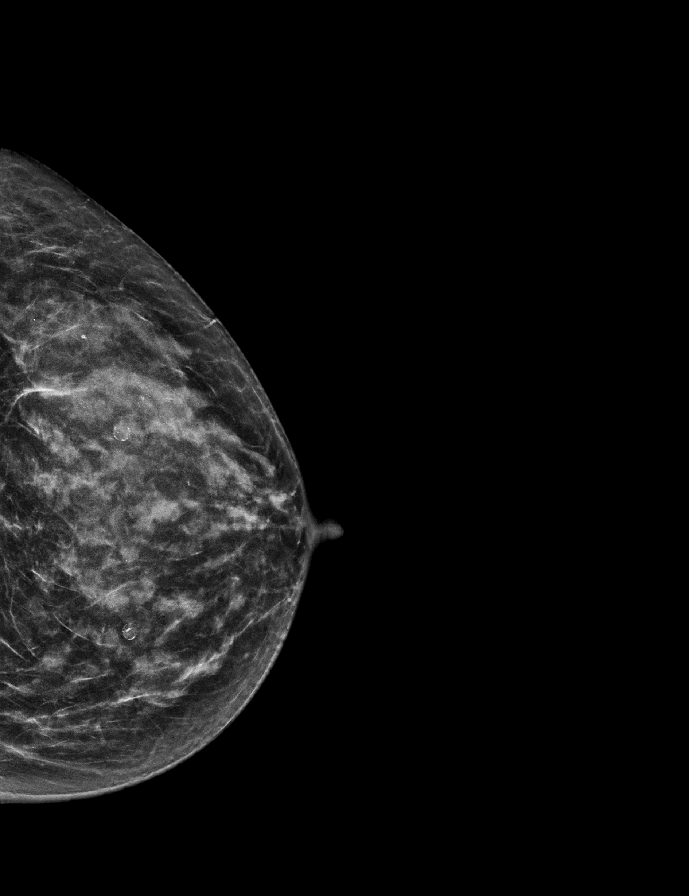

[L MLO (2 of 2)]
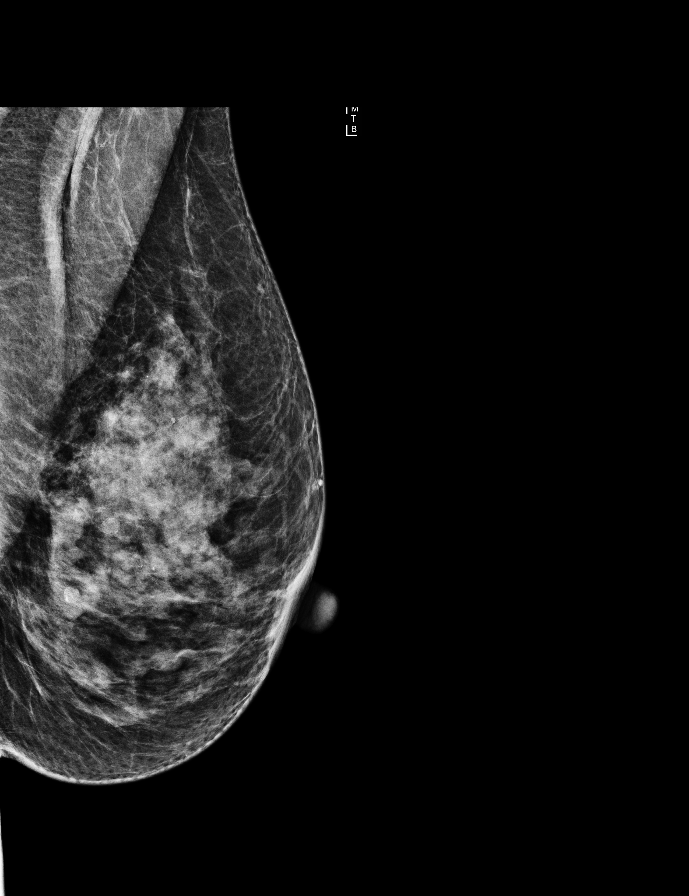

[9 of 30 positions shown; findings below may reference images not displayed]

ACR Breast Density Category d: The breast tissue is extremely dense,
which lowers the sensitivity of mammography.
FINDINGS: There are no findings suspicious for malignancy. Images were
processed with CAD.
IMPRESSION: No mammographic evidence of malignancy. A result letter of this
screening mammogram will be mailed directly to the patient.

RECOMMENDATION:
Screening mammogram in one year. (Code:[5L])

BI-RADS CATEGORY  1: Negative.

## 2016-04-27 ENCOUNTER — Ambulatory Visit: Payer: 59 | Attending: Orthopedic Surgery | Admitting: Physical Therapy

## 2016-04-27 ENCOUNTER — Encounter: Payer: Self-pay | Admitting: Physical Therapy

## 2016-04-27 VITALS — BP 109/60 | HR 69

## 2016-04-27 DIAGNOSIS — M25511 Pain in right shoulder: Secondary | ICD-10-CM | POA: Insufficient documentation
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
## 2016-04-29 ENCOUNTER — Encounter: Payer: Self-pay | Admitting: *Deleted

## 2016-04-30 ENCOUNTER — Encounter: Payer: Self-pay | Admitting: Physical Therapy

## 2016-04-30 ENCOUNTER — Ambulatory Visit: Payer: 59 | Admitting: Physical Therapy

## 2016-04-30 DIAGNOSIS — M25511 Pain in right shoulder: Secondary | ICD-10-CM
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
## 2016-05-04 ENCOUNTER — Ambulatory Visit: Payer: 59 | Admitting: Physical Therapy

## 2016-05-05 ENCOUNTER — Encounter: Payer: Self-pay | Admitting: General Surgery

## 2016-05-05 ENCOUNTER — Ambulatory Visit (INDEPENDENT_AMBULATORY_CARE_PROVIDER_SITE_OTHER): Payer: 59 | Admitting: General Surgery

## 2016-05-05 VITALS — BP 118/76 | HR 66 | Resp 12 | Ht 60.0 in | Wt 120.0 lb

## 2016-05-05 DIAGNOSIS — N6019 Diffuse cystic mastopathy of unspecified breast: Secondary | ICD-10-CM

## 2016-05-05 NOTE — Progress Notes (Signed)
Patient ID: Amanda Skinner, female   DOB: 29-Nov-1960, 56 y.o.   MRN: [id number]  Chief Complaint  Patient presents with  . Follow-up    mammogram    HPI Amanda Skinner is a 56 y.o. female who presents for a breast evaluation. The most recent mammogram was done on 04/23/2016.  Patient does perform regular self breast checks and gets regular mammograms done.   I have reviewed the history of present illness with the patient.  HPI  Past Medical History:  Diagnosis Date  . Asthma 2001  . Diffuse cystic mastopathy 2012   left  . TMJ (dislocation of temporomandibular joint) 2012  . Ulcer [facility])     Past Surgical History:  Procedure Laterality Date  . BREAST BIOPSY Right    Benign  . BREAST CYST ASPIRATION Bilateral   . CESAREAN SECTION  1991  . COLONOSCOPY  June 2015   [name]  . FOOT SURGERY Left 10/2011  . lower extremity surgery   2001  . RHINOPLASTY  1996  . tonsilloadenoidectomy   1978  . TUBAL LIGATION  1991    Family History  Problem Relation Age of Onset  . Heart attack Mother   . Heart disease Mother   . Heart disease Maternal Grandmother     Social History Social History  Substance Use Topics  . Smoking status: Never Smoker  . Smokeless tobacco: Never Used  . Alcohol use No    Allergies  Allergen Reactions  . Etodolac Other (See Comments)    Reaction:  Migraines     Current Outpatient Prescriptions  Medication Sig Dispense Refill  . albuterol (PROVENTIL HFA;VENTOLIN HFA) 108 (90 BASE) MCG/ACT inhaler Inhale 2 puffs into the lungs every 6 (six) hours as needed for wheezing or shortness of breath. 2 Inhaler 0  . cetirizine (ZYRTEC) 10 MG tablet Take 10 mg by mouth daily.    . Cholecalciferol (VITAMIN D3) 400 UNITS CHEW Chew 800 Units by mouth daily. Reported on 06/17/2015    . estradiol (ESTRACE) 0.1 MG/GM vaginal cream Place 1 Applicatorful vaginally 2 (two) times a week.    Marland Kitchen ibuprofen (ADVIL,MOTRIN) 600 MG tablet Take 600 mg by mouth every 8  (eight) hours as needed. Reported on 08/01/2015    . naproxen sodium (ANAPROX) 220 MG tablet Take 220 mg by mouth as needed. Reported on 05/03/2015    . terbinafine (LAMISIL) 250 MG tablet Take by mouth.    . fluticasone (FLONASE) 50 MCG/ACT nasal spray Place 2 sprays into both nostrils as needed for allergies or rhinitis. 16 g 6  . TURMERIC CURCUMIN PO Take by mouth.     No current facility-administered medications for this visit.     Review of Systems Review of Systems  Constitutional: Negative.   Respiratory: Negative.   Cardiovascular: Negative.     Blood pressure 118/76, pulse 66, resp. rate 12, height 5' (1.524 m), weight 120 lb (54.4 kg).  Physical Exam Physical Exam  Constitutional: She is oriented to person, place, and time. She appears well-developed and well-nourished.  Eyes: Conjunctivae are normal. No scleral icterus.  Neck: Neck supple.  Cardiovascular: Normal rate, regular rhythm and normal heart sounds.   Pulmonary/Chest: Effort normal and breath sounds normal. Right breast exhibits no inverted nipple, no mass, no nipple discharge, no skin change and no tenderness. Left breast exhibits no inverted nipple, no mass, no nipple discharge, no skin change and no tenderness.  Abdominal: Soft. Bowel sounds are normal. There is no tenderness.  Lymphadenopathy:    She has no cervical adenopathy.    She has no axillary adenopathy.  Neurological: She is alert and oriented to person, place, and time.    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 She has had stable exams for several yrs now.  Plan    Patient to return to her GYN for annual bilateral screening mammogram and breast checks.     This information has been scribed by Gaspar Cola CMA.    Gaspar Cola 05/05/2016, 4:02 PM

## 2016-05-05 NOTE — Patient Instructions (Signed)
Patient to return to her PCP for bilateral screening mammogram and breast checks.

## 2016-05-06 ENCOUNTER — Encounter: Payer: Self-pay | Admitting: Physical Therapy

## 2016-05-06 ENCOUNTER — Ambulatory Visit: Payer: 59 | Admitting: Physical Therapy

## 2016-05-06 DIAGNOSIS — M25511 Pain in right shoulder: Secondary | ICD-10-CM | POA: Diagnosis not present
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
## 2016-05-12 ENCOUNTER — Ambulatory Visit: Payer: 59 | Admitting: Physical Therapy

## 2016-05-12 ENCOUNTER — Encounter: Payer: Self-pay | Admitting: Physical Therapy

## 2016-05-12 DIAGNOSIS — M25511 Pain in right shoulder: Secondary | ICD-10-CM
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
## 2016-05-14 ENCOUNTER — Encounter: Payer: Self-pay | Admitting: Physical Therapy

## 2016-05-18 ENCOUNTER — Encounter: Payer: Self-pay | Admitting: Physical Therapy

## 2016-05-18 ENCOUNTER — Ambulatory Visit: Payer: 59 | Admitting: Physical Therapy

## 2016-05-18 DIAGNOSIS — M25511 Pain in right shoulder: Secondary | ICD-10-CM
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
## 2016-05-19 ENCOUNTER — Encounter: Payer: Self-pay | Admitting: Physical Therapy

## 2016-05-20 ENCOUNTER — Encounter: Payer: Self-pay | Admitting: Physical Therapy

## 2016-05-20 ENCOUNTER — Ambulatory Visit: Payer: 59 | Admitting: Physical Therapy

## 2016-05-20 DIAGNOSIS — M25511 Pain in right shoulder: Secondary | ICD-10-CM
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
## 2016-05-21 ENCOUNTER — Encounter: Payer: Self-pay | Admitting: Physical Therapy

## 2016-05-26 ENCOUNTER — Encounter: Payer: Self-pay | Admitting: Physical Therapy

## 2016-05-26 ENCOUNTER — Ambulatory Visit: Payer: 59 | Attending: Orthopedic Surgery | Admitting: Physical Therapy

## 2016-05-26 DIAGNOSIS — M25511 Pain in right shoulder: Secondary | ICD-10-CM | POA: Insufficient documentation
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
## 2016-05-28 ENCOUNTER — Ambulatory Visit: Payer: 59 | Admitting: Physical Therapy

## 2016-06-02 ENCOUNTER — Ambulatory Visit: Payer: 59 | Admitting: Physical Therapy

## 2016-06-02 ENCOUNTER — Encounter: Payer: Self-pay | Admitting: Physical Therapy

## 2016-06-02 DIAGNOSIS — M25511 Pain in right shoulder: Secondary | ICD-10-CM
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
## 2016-06-04 ENCOUNTER — Encounter: Payer: Self-pay | Admitting: Physical Therapy

## 2016-06-04 ENCOUNTER — Ambulatory Visit: Payer: 59 | Admitting: Physical Therapy

## 2016-06-04 DIAGNOSIS — M25511 Pain in right shoulder: Secondary | ICD-10-CM
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
## 2016-06-09 ENCOUNTER — Ambulatory Visit: Payer: 59 | Admitting: Physical Therapy

## 2016-06-09 ENCOUNTER — Encounter: Payer: Self-pay | Admitting: Physical Therapy

## 2016-06-09 DIAGNOSIS — M25511 Pain in right shoulder: Secondary | ICD-10-CM | POA: Diagnosis not present
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
## 2016-06-11 ENCOUNTER — Ambulatory Visit: Payer: 59 | Admitting: Physical Therapy

## 2016-06-11 ENCOUNTER — Encounter: Payer: Self-pay | Admitting: Physical Therapy

## 2016-06-11 DIAGNOSIS — M25511 Pain in right shoulder: Secondary | ICD-10-CM
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
## 2016-06-11 NOTE — Addendum Note (Signed)
Addended by: Alanson Puls on: 06/11/2016 04:59 PM   Modules accepted: Orders

## 2016-06-16 DIAGNOSIS — M25511 Pain in right shoulder: Secondary | ICD-10-CM | POA: Diagnosis not present

## 2016-06-16 DIAGNOSIS — M7581 Other shoulder lesions, right shoulder: Secondary | ICD-10-CM | POA: Diagnosis not present

## 2016-07-30 ENCOUNTER — Encounter: Payer: Self-pay | Admitting: Certified Nurse Midwife

## 2016-07-30 ENCOUNTER — Ambulatory Visit (INDEPENDENT_AMBULATORY_CARE_PROVIDER_SITE_OTHER): Payer: 59 | Admitting: Certified Nurse Midwife

## 2016-07-30 VITALS — BP 102/74 | HR 68 | Ht 61.0 in | Wt 116.0 lb

## 2016-07-30 DIAGNOSIS — N952 Postmenopausal atrophic vaginitis: Secondary | ICD-10-CM

## 2016-07-30 DIAGNOSIS — Z124 Encounter for screening for malignant neoplasm of cervix: Secondary | ICD-10-CM

## 2016-07-30 DIAGNOSIS — Z01419 Encounter for gynecological examination (general) (routine) without abnormal findings: Secondary | ICD-10-CM

## 2016-07-30 MED ORDER — ESTRADIOL 0.1 MG/GM VA CREA: 1.0000 | TOPICAL_CREAM | VAGINAL | 4 refills | Status: DC
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
## 2016-08-03 ENCOUNTER — Encounter: Payer: Self-pay | Admitting: Certified Nurse Midwife

## 2016-08-03 DIAGNOSIS — N952 Postmenopausal atrophic vaginitis: Secondary | ICD-10-CM | POA: Insufficient documentation

## 2016-08-03 LAB — IGP,RFX APTIMA HPV ALL PTH: PAP SMEAR COMMENT: 0

## 2017-01-05 DIAGNOSIS — D1801 Hemangioma of skin and subcutaneous tissue: Secondary | ICD-10-CM | POA: Diagnosis not present

## 2017-01-05 DIAGNOSIS — L570 Actinic keratosis: Secondary | ICD-10-CM | POA: Diagnosis not present

## 2017-01-05 DIAGNOSIS — Z1283 Encounter for screening for malignant neoplasm of skin: Secondary | ICD-10-CM | POA: Diagnosis not present

## 2017-01-05 DIAGNOSIS — L814 Other melanin hyperpigmentation: Secondary | ICD-10-CM | POA: Diagnosis not present

## 2017-01-05 DIAGNOSIS — D229 Melanocytic nevi, unspecified: Secondary | ICD-10-CM | POA: Diagnosis not present

## 2017-01-12 ENCOUNTER — Other Ambulatory Visit: Payer: Self-pay | Admitting: Family Medicine

## 2017-01-12 DIAGNOSIS — J3089 Other allergic rhinitis: Secondary | ICD-10-CM

## 2017-02-04 DIAGNOSIS — H524 Presbyopia: Secondary | ICD-10-CM | POA: Diagnosis not present

## 2017-04-05 ENCOUNTER — Encounter: Payer: Self-pay | Admitting: Family Medicine

## 2017-04-05 ENCOUNTER — Ambulatory Visit: Payer: 59 | Admitting: Family Medicine

## 2017-04-05 ENCOUNTER — Other Ambulatory Visit: Payer: Self-pay

## 2017-04-05 ENCOUNTER — Other Ambulatory Visit: Payer: Self-pay | Admitting: Certified Nurse Midwife

## 2017-04-05 VITALS — BP 108/80 | HR 68 | Temp 97.5°F | Ht 60.0 in | Wt 122.0 lb

## 2017-04-05 DIAGNOSIS — Z114 Encounter for screening for human immunodeficiency virus [HIV]: Secondary | ICD-10-CM

## 2017-04-05 DIAGNOSIS — Z1231 Encounter for screening mammogram for malignant neoplasm of breast: Secondary | ICD-10-CM

## 2017-04-05 DIAGNOSIS — Z0000 Encounter for general adult medical examination without abnormal findings: Secondary | ICD-10-CM

## 2017-04-05 DIAGNOSIS — Z13220 Encounter for screening for lipoid disorders: Secondary | ICD-10-CM

## 2017-04-05 NOTE — Progress Notes (Signed)
Tommi Rumps, MD Phone: [phone]  Amanda Skinner is a 57 y.o. female who presents today for physical exam.  Exercises daily by running or walking Has been eating more healthily and eating very clean. Colonoscopy sometime in the last 5 years.  Will request records. Tetanus vaccination up-to-date though we need to request records. Mammogram scheduled for March. Needs HIV testing for screening.  She opts into this. Pap smear up-to-date with negative cells.  No HPV tested. No tobacco use.  Rare alcohol use.  No illicit drug use.  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  Has been going on for about a month now.  It is 50% improved.  She is using heat and Aleve.  The last time this occurred she had to do physical therapy for this.  Occasional ear discomfort with this.  Active Ambulatory Problems    Diagnosis Date Noted  . Diffuse cystic mastopathy 05/11/2012  . Bilateral hand pain 10/08/2014  . Mild intermittent asthma 10/09/2014  . Allergic rhinitis 10/09/2014  . Musculoskeletal chest pain 01/29/2015  . Atypical chest pain 02/01/2015  . Family history of coronary artery disease 02/01/2015  . Morton's metatarsalgia, neuralgia, 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  . Ringworm 06/05/2015  . Fibromyalgia 06/05/2015  . GERD (gastroesophageal reflux disease) 01/01/2016  . Routine general medical examination at a health care facility 04/03/2016  . Postmenopausal atrophic vaginitis 08/03/2016   Resolved Ambulatory Problems    Diagnosis Date Noted  . Unstable angina ([place]) 01/21/2015   Past Medical History:  Diagnosis Date  . Asthma 2001  . Chronic sinusitis   . Depression   . Diffuse cystic mastopathy 2012  . Seasonal allergies   . TMJ (dislocation of temporomandibular joint) 2012  . Ulcer     Family History  Problem Relation Age of Onset  . Heart attack Mother   . Heart disease Mother        died from aortic dissection during CABG surgery  . Hypertension  Mother   . Stroke Mother   . Heart disease Maternal Grandmother   . Heart disease Maternal Uncle     Social History   Socioeconomic History  . Marital status: Married    Spouse name: Nassar  . Number of children: 2  . Years of education: Not on file  . Highest education level: Not on file  Social Needs  . Financial resource strain: Not on file  . Food insecurity - worry: Not on file  . Food insecurity - inability: Not on file  . Transportation needs - medical: Not on file  . Transportation needs - non-medical: Not on file  Occupational History  . Occupation: Equities trader  Tobacco Use  . Smoking status: Never Smoker  . Smokeless tobacco: Never Used  Substance and Sexual Activity  . Alcohol use: Yes    Comment: rarely  . Drug use: No  . Sexual activity: Yes    Birth control/protection: Post-menopausal  Other Topics Concern  . Not on file  Social History Narrative  . Not on file    ROS  General:  Negative for nexplained weight loss, fever Skin: Negative for new or changing mole, sore that won't heal HEENT: Negative for trouble hearing, trouble seeing, ringing in ears, mouth sores, hoarseness, change in voice, dysphagia. CV:  Negative for chest pain, dyspnea, edema, palpitations Resp: Negative for cough, dyspnea, hemoptysis GI: Negative for nausea, vomiting, diarrhea, constipation, abdominal pain, melena, hematochezia. GU: Negative for dysuria, incontinence, urinary hesitance, hematuria, vaginal or penile  discharge, polyuria, sexual difficulty, lumps in testicle or breasts MSK: Negative for muscle cramps or aches, joint pain or swelling Neuro: Negative for headaches, weakness, numbness, dizziness, passing out/fainting Psych: Negative for depression, anxiety, memory problems  Objective  Physical Exam Vitals:   04/05/17 1529  BP: 108/80  Pulse: 68  Temp: (!) 97.5 F (36.4 C)  SpO2: 96%    BP Readings from Last 3 Encounters:  04/05/17 108/80  04/03/16  120/80  01/01/16 98/66   Wt Readings from Last 3 Encounters:  04/05/17 122 lb (55.3 kg)  04/03/16 118 lb (53.5 kg)  01/01/16 118 lb 6.4 oz (53.7 kg)    Physical Exam  Constitutional: No distress.  HENT:  Head: Normocephalic and atraumatic.  Mouth/Throat: Oropharynx is clear and moist.  Normal TMs bilaterally, slight tenderness over the left TMJ, right TMJ normal  Eyes: Conjunctivae are normal. Pupils are equal, round, and reactive to light.  Cardiovascular: Normal rate, regular rhythm and normal heart sounds.  Pulmonary/Chest: Effort normal and breath sounds normal.  Abdominal: Soft. Bowel sounds are normal. She exhibits no distension. There is no tenderness. There is no rebound and no guarding.  Neurological: She is alert. Gait normal.  Skin: Skin is warm and dry. She is not diaphoretic.  Psychiatric: Mood and affect normal.     Assessment/Plan:   Routine general medical examination at a health care facility Physical exam completed.  Pelvic exam and breast exam deferred to gynecology.  Mammogram is set up.  She will have fasting labs done at the hospital.  We will request colonoscopy and tetanus vaccination records.  She will see her orthodontist regarding her TMJ and follow-up.  She may need a referral to a specialist if she continues to have issues with this.   Orders Placed This Encounter  Procedures  . Comp Met (CMET)    Standing Status:   Future    Standing Expiration Date:   04/05/2018  . Lipid panel    Standing Status:   Future    Standing Expiration Date:   04/05/2018  . HIV antibody (with reflex)    Standing Status:   Future    Standing Expiration Date:   04/05/2018    No orders of the defined types were placed in this encounter.    Tommi Rumps, MD [place]

## 2017-04-05 NOTE — Patient Instructions (Signed)
Nice to see you. Please see the orthodontist regarding your TMJ. Please continue to work on diet and exercise. Please get lab work fasting at the hospital.

## 2017-04-05 NOTE — Assessment & Plan Note (Signed)
Physical exam completed.  Pelvic exam and breast exam deferred to gynecology.  Mammogram is set up.  She will have fasting labs done at the hospital.  We will request colonoscopy and tetanus vaccination records.  She will see her orthodontist regarding her TMJ and follow-up.  She may need a referral to a specialist if she continues to have issues with this.

## 2017-04-07 ENCOUNTER — Other Ambulatory Visit
Admission: RE | Admit: 2017-04-07 | Discharge: 2017-04-07 | Disposition: A | Discharge status: Home / Self Care | Payer: 59 | Source: Ambulatory Visit | Attending: Family Medicine | Admitting: Family Medicine

## 2017-04-07 DIAGNOSIS — Z13220 Encounter for screening for lipoid disorders: Secondary | ICD-10-CM | POA: Diagnosis not present

## 2017-04-07 DIAGNOSIS — Z0000 Encounter for general adult medical examination without abnormal findings: Secondary | ICD-10-CM | POA: Diagnosis not present

## 2017-04-07 LAB — LIPID PANEL
Cholesterol: 188 mg/dL (ref 0–200)
HDL: 84 mg/dL (ref >40)
LDL Cholesterol: 93 mg/dL (ref 0–99)
Total CHOL/HDL Ratio: 2.2 RATIO
Triglycerides: 53 mg/dL (ref <150)
VLDL: 11 mg/dL (ref 0–40)

## 2017-04-07 LAB — COMPREHENSIVE METABOLIC PANEL
ALBUMIN: 4.2 g/dL (ref 3.5–5.0)
ALK PHOS: 83 U/L (ref 38–126)
ALT: 19 U/L (ref 14–54)
ANION GAP: 8 (ref 5–15)
AST: 16 U/L (ref 15–41)
BILIRUBIN TOTAL: 0.7 mg/dL (ref 0.3–1.2)
BUN: 15 mg/dL (ref 6–20)
CALCIUM: 9.3 mg/dL (ref 8.9–10.3)
CO2: 28 mmol/L (ref 22–32)
CREATININE: 0.8 mg/dL (ref 0.44–1.00)
Chloride: 98 mmol/L — ABNORMAL LOW (ref 101–111)
GFR calc Af Amer: >60 mL/min (ref >60)
GFR calc non Af Amer: >60 mL/min (ref >60)
GLUCOSE: 90 mg/dL (ref 65–99)
Potassium: 4 mmol/L (ref 3.5–5.1)
SODIUM: 134 mmol/L — AB (ref 135–145)
TOTAL PROTEIN: 6.8 g/dL (ref 6.5–8.1)

## 2017-04-08 LAB — HIV ANTIBODY (ROUTINE TESTING W REFLEX): HIV SCREEN 4TH GENERATION: NONREACTIVE

## 2017-04-12 ENCOUNTER — Encounter: Payer: Self-pay | Admitting: Family Medicine

## 2017-04-12 DIAGNOSIS — L578 Other skin changes due to chronic exposure to nonionizing radiation: Secondary | ICD-10-CM | POA: Diagnosis not present

## 2017-04-12 DIAGNOSIS — L570 Actinic keratosis: Secondary | ICD-10-CM | POA: Diagnosis not present

## 2017-04-12 DIAGNOSIS — L814 Other melanin hyperpigmentation: Secondary | ICD-10-CM | POA: Diagnosis not present

## 2017-04-26 ENCOUNTER — Ambulatory Visit
Admission: RE | Admit: 2017-04-26 | Discharge: 2017-04-26 | Disposition: A | Discharge status: Home / Self Care | Payer: 59 | Source: Ambulatory Visit | Attending: Certified Nurse Midwife | Admitting: Certified Nurse Midwife

## 2017-04-26 DIAGNOSIS — Z1231 Encounter for screening mammogram for malignant neoplasm of breast: Secondary | ICD-10-CM | POA: Insufficient documentation

## 2017-04-26 IMAGING — MG MM DIGITAL SCREENING BILAT W/ TOMO W/ CAD
9 of 14 series · 9 of 30 positions shown · non-contrast
Comparison: Previous exam(s).

CLINICAL DATA: Screening.

EXAM:
DIGITAL SCREENING BILATERAL MAMMOGRAM WITH TOMO AND CAD

[R XCCL]
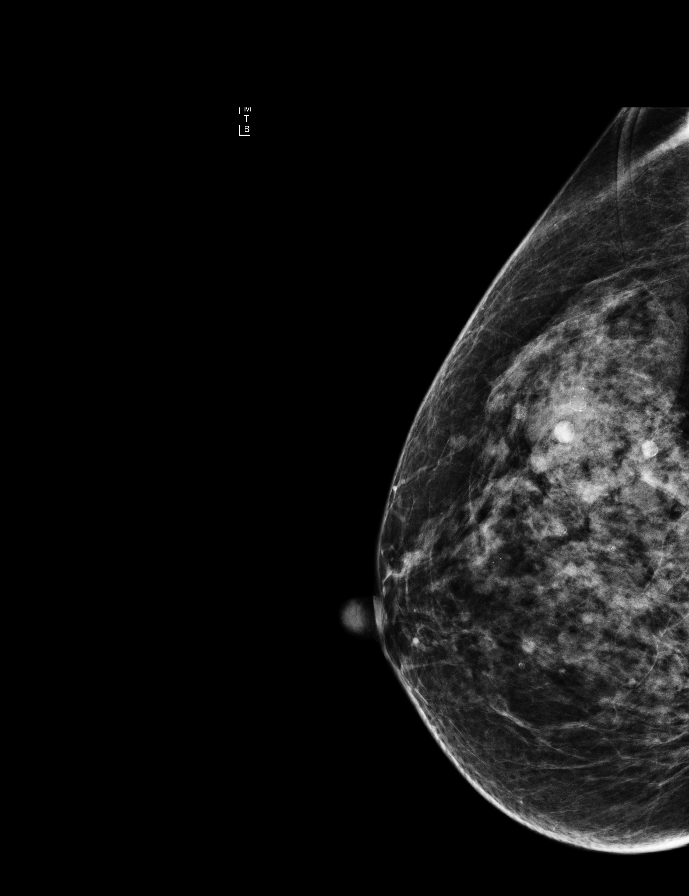

[R MLO (1 of 2)]
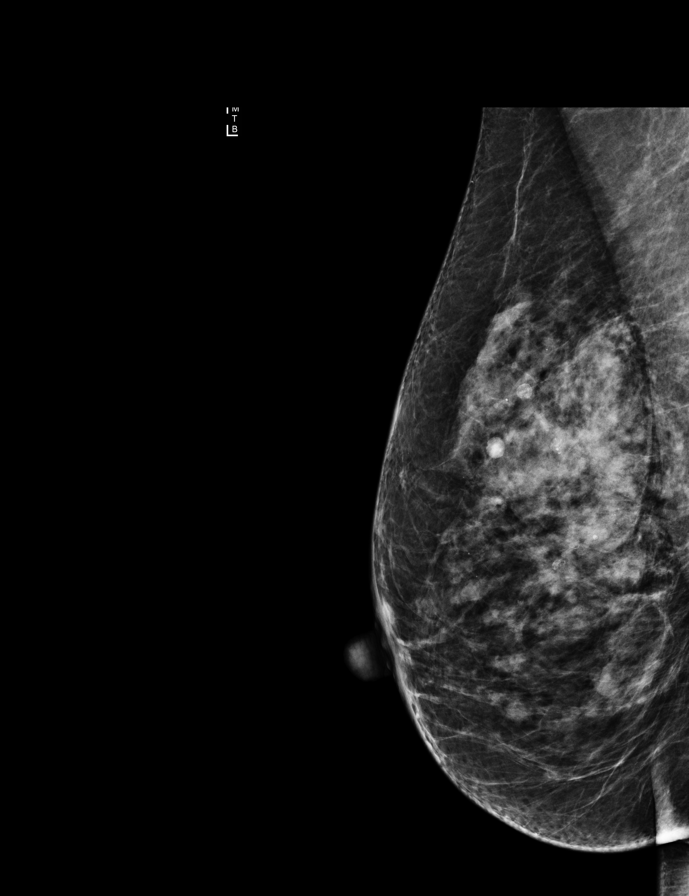

[L MLO synth-2D]
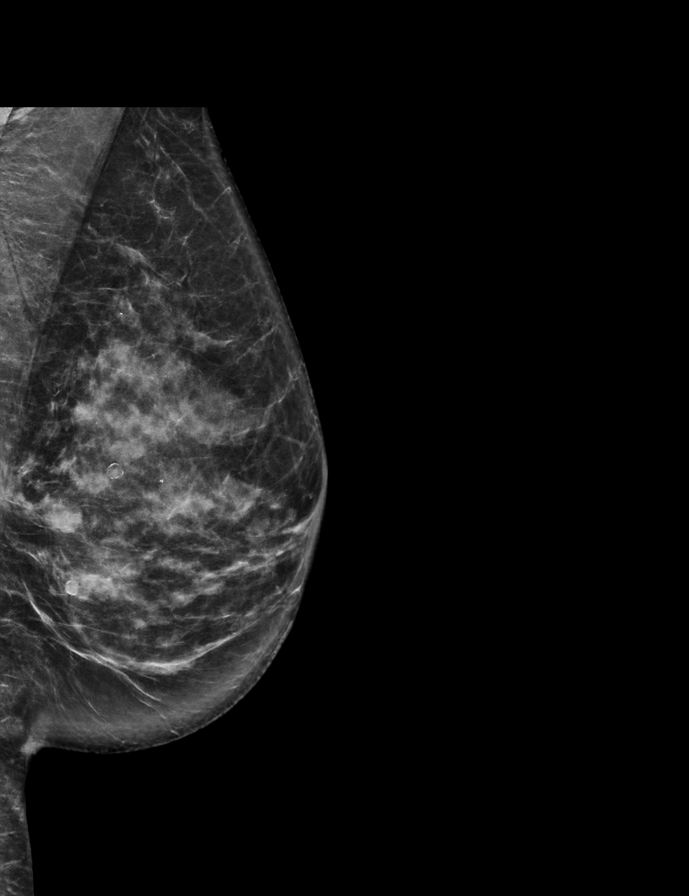

[R MLO (2 of 2)]
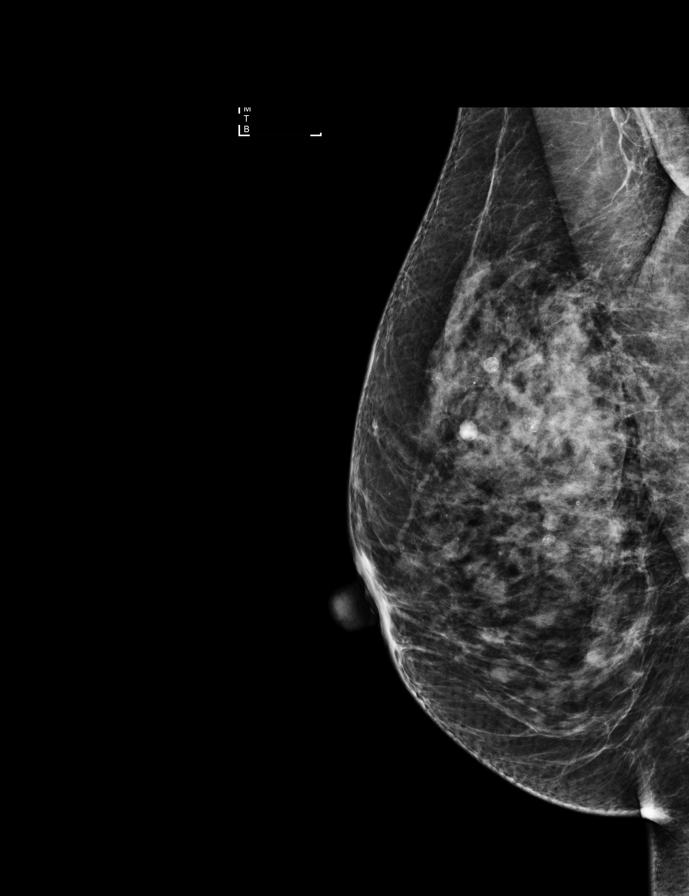

[L CC synth-2D]
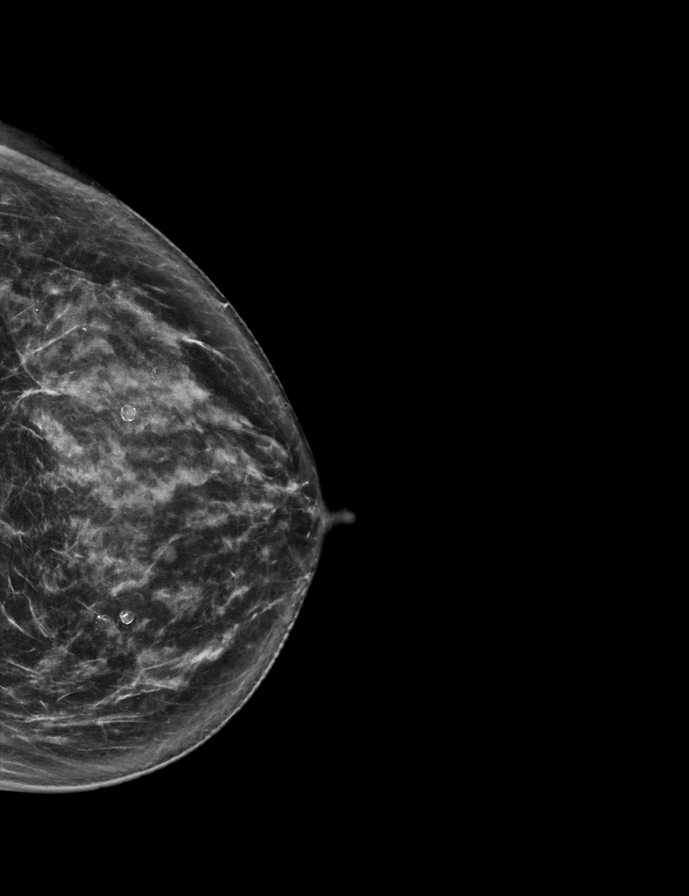

[R MLO synth-2D]
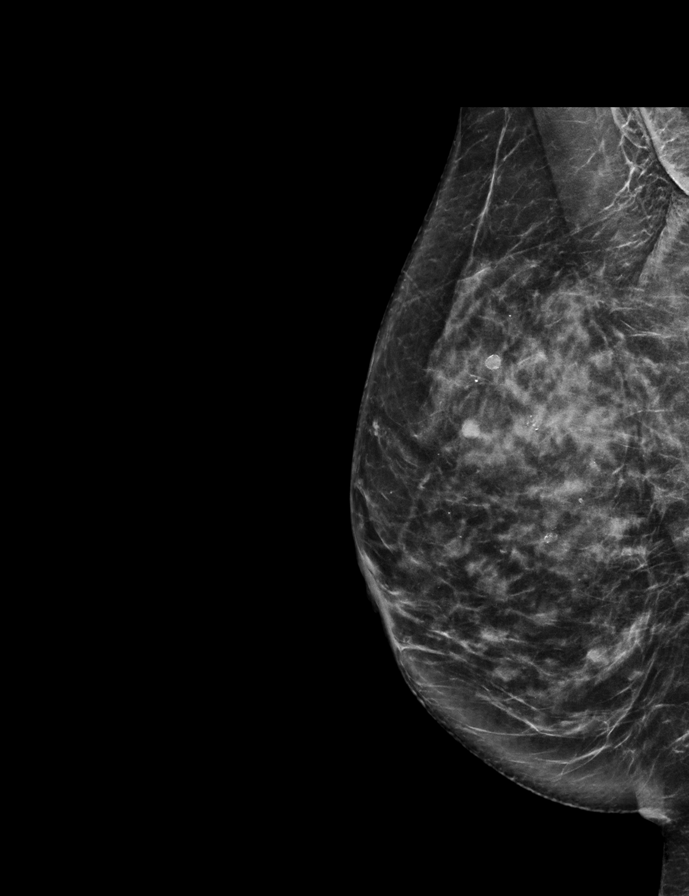

[R CC synth-2D]
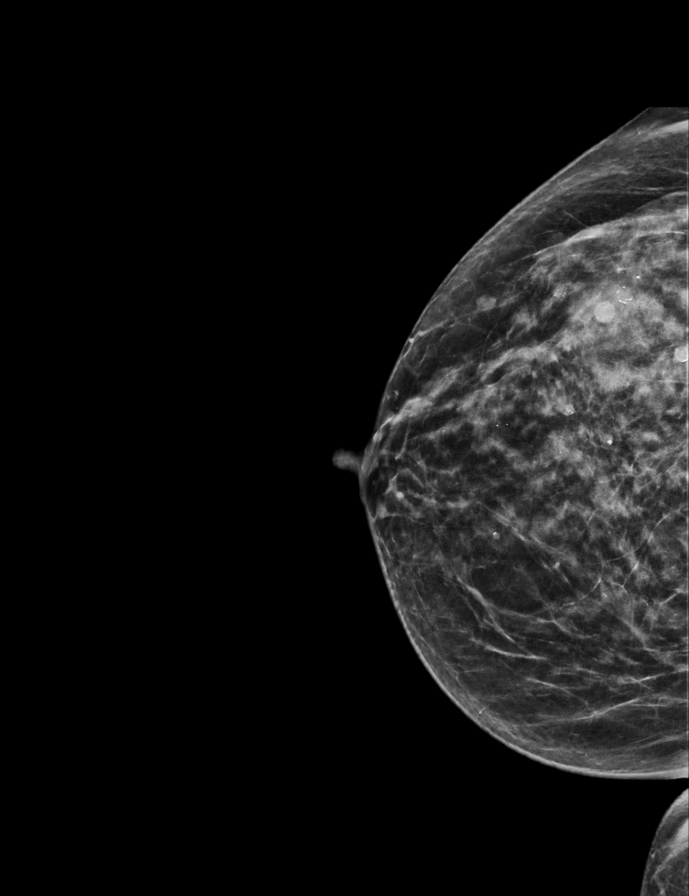

[R CC]
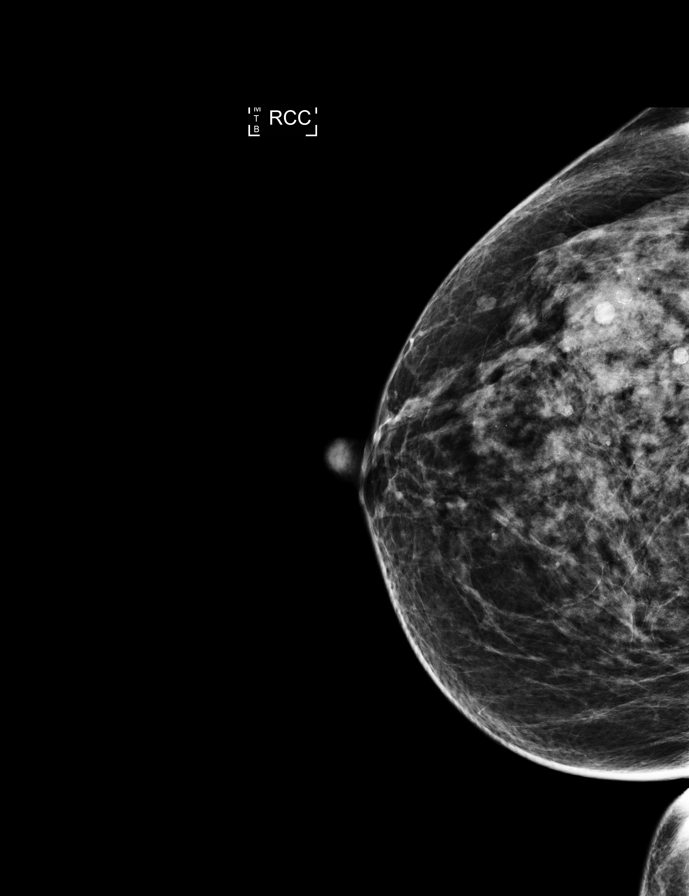

[L CC]
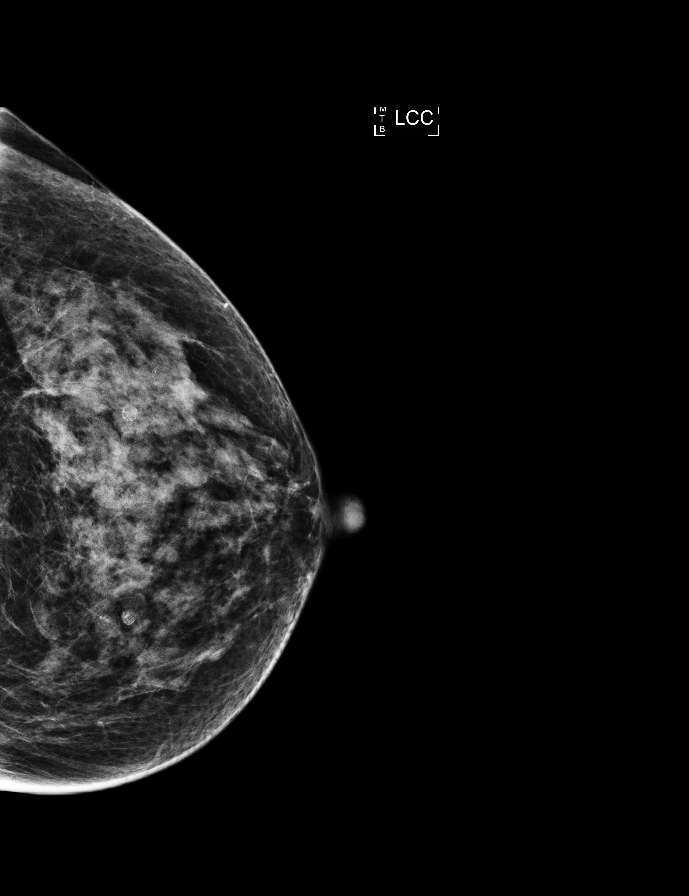

[9 of 30 positions shown; findings below may reference images not displayed]

ACR Breast Density Category c: The breast tissue is heterogeneously
dense, which may obscure small masses.
FINDINGS: There are no findings suspicious for malignancy. Images were
processed with CAD.
IMPRESSION: No mammographic evidence of malignancy. A result letter of this
screening mammogram will be mailed directly to the patient.

RECOMMENDATION:
Screening mammogram in one year. (Code:[5V])

BI-RADS CATEGORY  1: Negative.

## 2017-05-18 DIAGNOSIS — M7581 Other shoulder lesions, right shoulder: Secondary | ICD-10-CM | POA: Diagnosis not present

## 2017-05-18 DIAGNOSIS — M7582 Other shoulder lesions, left shoulder: Secondary | ICD-10-CM | POA: Diagnosis not present

## 2017-05-31 ENCOUNTER — Other Ambulatory Visit: Payer: Self-pay | Admitting: Family Medicine

## 2017-06-10 ENCOUNTER — Encounter: Payer: Self-pay | Admitting: Obstetrics and Gynecology

## 2017-06-10 ENCOUNTER — Ambulatory Visit: Payer: 59 | Admitting: Obstetrics and Gynecology

## 2017-06-10 ENCOUNTER — Ambulatory Visit (INDEPENDENT_AMBULATORY_CARE_PROVIDER_SITE_OTHER): Payer: 59

## 2017-06-10 VITALS — BP 116/74 | Ht 61.0 in | Wt 118.0 lb

## 2017-06-10 DIAGNOSIS — N882 Stricture and stenosis of cervix uteri: Secondary | ICD-10-CM | POA: Diagnosis not present

## 2017-06-10 DIAGNOSIS — N950 Postmenopausal bleeding: Secondary | ICD-10-CM | POA: Diagnosis not present

## 2017-06-10 DIAGNOSIS — R1032 Left lower quadrant pain: Secondary | ICD-10-CM

## 2017-06-10 MED ORDER — MELOXICAM 7.5 MG PO TABS: 3.7500 mg | ORAL_TABLET | Freq: Every day | ORAL | 0 refills | Status: DC
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
## 2017-06-11 ENCOUNTER — Encounter: Payer: Self-pay | Admitting: Obstetrics and Gynecology

## 2017-06-14 ENCOUNTER — Encounter
Admission: RE | Admit: 2017-06-14 | Discharge: 2017-06-14 | Disposition: A | Discharge status: Home / Self Care | Payer: 59 | Source: Ambulatory Visit | Attending: Obstetrics and Gynecology | Admitting: Obstetrics and Gynecology

## 2017-06-14 ENCOUNTER — Other Ambulatory Visit: Payer: Self-pay

## 2017-06-14 HISTORY — DX: Other complications of anesthesia, initial encounter: T88.59XA

## 2017-06-14 HISTORY — DX: Other specified postprocedural states: Z98.890

## 2017-06-14 HISTORY — DX: Nausea with vomiting, unspecified: R11.2

## 2017-06-14 HISTORY — DX: Adverse effect of unspecified anesthetic, initial encounter: T41.45XA

## 2017-06-14 NOTE — Patient Instructions (Signed)
Your procedure is scheduled on: 06-15-17 Report to Same Day Surgery 2nd floor medical mall [facility] Entrance-take elevator on left to 2nd floor.  Check in with surgery information desk.) To find out your arrival time please call [phone] between 1PM - 3PM on 06-14-17  Remember: Instructions that are not followed completely may result in serious medical risk, up to and including death, or upon the discretion of your surgeon and anesthesiologist your surgery may need to be rescheduled.    _x___ 1. Do not eat food after midnight the night before your procedure. You may drink clear liquids up to 2 hours before you are scheduled to arrive at the hospital for your procedure.  Do not drink clear liquids within 2 hours of your scheduled arrival to the hospital.  Clear liquids include  --Water or Apple juice without pulp  --Clear carbohydrate beverage such as ClearFast or Gatorade  --Black Coffee or Clear Tea (No milk, no creamers, do not add anything to  the coffee or Tea   No gum chewing or hard candies.     __x__ 2. No Alcohol for 24 hours before or after surgery.   __x__3. No Smoking or e-cigarettes for 24 prior to surgery.  Do not use any chewable tobacco products for at least 6 hour prior to surgery   ____  4. Bring all medications with you on the day of surgery if instructed.    __x__ 5. Notify your doctor if there is any change in your medical condition     (cold, fever, infections).    x___6. On the morning of surgery brush your teeth with toothpaste and water.  You may rinse your mouth with mouth wash if you wish.  Do not swallow any toothpaste or mouthwash.   Do not wear jewelry, make-up, hairpins, clips or nail polish.  Do not wear lotions, powders, or perfumes. You may wear deodorant.  Do not shave 48 hours prior to surgery. Men may shave face and neck.  Do not bring valuables to the hospital.    [facility] is not responsible for any belongings or valuables.       Contacts, dentures or bridgework may not be worn into surgery.  Leave your suitcase in the car. After surgery it may be brought to your room.  For patients admitted to the hospital, discharge time is determined by your                       treatment team.  _  Patients discharged the day of surgery will not be allowed to drive home.  You will need someone to drive you home and stay with you the night of your procedure.    Please read over the following fact sheets that you were given:   [facility] Preparing for Surgery and or MRSA Information   ____ Take anti-hypertensive listed below, cardiac, seizure, asthma, anti-reflux and psychiatric medicines. These include:  1. NONE  2.  3.  4.  5.  6.  ____Fleets enema or Magnesium Citrate as directed.   ____ Use CHG Soap or sage wipes as directed on instruction sheet   __X__ Use inhalers on the day of surgery and bring to hospital day of surgery-USE VENTOLIN INHALER AM [place]  ____ Stop Metformin and Janumet 2 days prior to surgery.    ____ Take 1/2 of usual insulin dose the night before surgery and none on the morning surgery.  ____ Follow recommendations from Cardiologist, Pulmonologist or PCP regarding stopping Aspirin, Coumadin, Plavix ,Eliquis, Effient, or Pradaxa, and Pletal.  ____Stop Anti-inflammatories such as Advil, Aleve, Ibuprofen, Motrin, Naproxen, Naprosyn, Goodies powders or aspirin products. OK to take Tylenol-DR JACKSON TOLD PT SHE DID NOT HAVE TO STOP MELOXICAM   ____ Stop supplements until after surgery.     ____ Bring C-Pap to the hospital.

## 2017-06-15 ENCOUNTER — Encounter
Admission: RE | Disposition: A | Discharge status: Home / Self Care | Payer: Self-pay | Source: Ambulatory Visit | Attending: Obstetrics and Gynecology

## 2017-06-15 ENCOUNTER — Ambulatory Visit
Admission: RE | Admit: 2017-06-15 | Discharge: 2017-06-15 | Disposition: A | Discharge status: Home / Self Care | Payer: 59 | Source: Ambulatory Visit | Attending: Obstetrics and Gynecology | Admitting: Obstetrics and Gynecology

## 2017-06-15 ENCOUNTER — Other Ambulatory Visit: Payer: Self-pay

## 2017-06-15 ENCOUNTER — Ambulatory Visit: Payer: 59 | Admitting: Anesthesiology

## 2017-06-15 ENCOUNTER — Encounter: Payer: Self-pay | Admitting: *Deleted

## 2017-06-15 DIAGNOSIS — J45909 Unspecified asthma, uncomplicated: Secondary | ICD-10-CM | POA: Insufficient documentation

## 2017-06-15 DIAGNOSIS — N882 Stricture and stenosis of cervix uteri: Secondary | ICD-10-CM | POA: Diagnosis not present

## 2017-06-15 DIAGNOSIS — N950 Postmenopausal bleeding: Secondary | ICD-10-CM | POA: Diagnosis not present

## 2017-06-15 DIAGNOSIS — N840 Polyp of corpus uteri: Secondary | ICD-10-CM | POA: Diagnosis not present

## 2017-06-15 DIAGNOSIS — Z79890 Hormone replacement therapy (postmenopausal): Secondary | ICD-10-CM | POA: Insufficient documentation

## 2017-06-15 DIAGNOSIS — Z79899 Other long term (current) drug therapy: Secondary | ICD-10-CM | POA: Insufficient documentation

## 2017-06-15 DIAGNOSIS — K219 Gastro-esophageal reflux disease without esophagitis: Secondary | ICD-10-CM | POA: Diagnosis not present

## 2017-06-15 HISTORY — PX: DILATATION & CURETTAGE/HYSTEROSCOPY WITH MYOSURE: SHX6511

## 2017-06-15 SURGERY — DILATATION & CURETTAGE/HYSTEROSCOPY WITH MYOSURE
Anesthesia: General | Wound class: Clean Contaminated

## 2017-06-15 MED ORDER — ONDANSETRON HCL 4 MG/2ML IJ SOLN
INTRAMUSCULAR | Status: DC | PRN
  Administered 2017-06-15: 4 mg via INTRAVENOUS

## 2017-06-15 MED ORDER — ONDANSETRON 4 MG PO TBDP: 4.0000 mg | ORAL_TABLET | Freq: Three times a day (TID) | ORAL | 0 refills | Status: DC | PRN

## 2017-06-15 MED ORDER — KETOROLAC TROMETHAMINE 30 MG/ML IJ SOLN
INTRAMUSCULAR | Status: AC
  Filled 2017-06-15: qty 1

## 2017-06-15 MED ORDER — ONDANSETRON HCL 4 MG/2ML IJ SOLN
INTRAMUSCULAR | Status: AC
  Filled 2017-06-15: qty 2

## 2017-06-15 MED ORDER — GLYCOPYRROLATE 0.2 MG/ML IJ SOLN
INTRAMUSCULAR | Status: DC | PRN
  Administered 2017-06-15: 0.1 mg via INTRAVENOUS

## 2017-06-15 MED ORDER — GLYCOPYRROLATE 0.2 MG/ML IJ SOLN
INTRAMUSCULAR | Status: AC
  Filled 2017-06-15: qty 1

## 2017-06-15 MED ORDER — FENTANYL CITRATE (PF) 100 MCG/2ML IJ SOLN
INTRAMUSCULAR | Status: AC
  Administered 2017-06-15: 25 ug via INTRAVENOUS
  Filled 2017-06-15: qty 2

## 2017-06-15 MED ORDER — LIDOCAINE HCL (PF) 2 % IJ SOLN
INTRAMUSCULAR | Status: AC
  Filled 2017-06-15: qty 10

## 2017-06-15 MED ORDER — SCOPOLAMINE 1 MG/3DAYS TD PT72
MEDICATED_PATCH | TRANSDERMAL | Status: AC
  Administered 2017-06-15: 1.5 mg via TRANSDERMAL
  Filled 2017-06-15: qty 1

## 2017-06-15 MED ORDER — HYDROMORPHONE HCL 1 MG/ML IJ SOLN
INTRAMUSCULAR | Status: DC | PRN
  Administered 2017-06-15: 1 mg via INTRAVENOUS

## 2017-06-15 MED ORDER — DEXAMETHASONE SODIUM PHOSPHATE 10 MG/ML IJ SOLN
INTRAMUSCULAR | Status: AC
  Filled 2017-06-15: qty 1

## 2017-06-15 MED ORDER — FAMOTIDINE 20 MG PO TABS
ORAL_TABLET | ORAL | Status: AC
  Administered 2017-06-15: 20 mg via ORAL
  Filled 2017-06-15: qty 1

## 2017-06-15 MED ORDER — SILVER NITRATE-POT NITRATE 75-25 % EX MISC
CUTANEOUS | Status: DC | PRN
  Administered 2017-06-15: 1 via TOPICAL

## 2017-06-15 MED ORDER — MIDAZOLAM HCL 2 MG/2ML IJ SOLN
INTRAMUSCULAR | Status: DC | PRN
  Administered 2017-06-15: 4 mg via INTRAVENOUS

## 2017-06-15 MED ORDER — MIDAZOLAM HCL 2 MG/2ML IJ SOLN
INTRAMUSCULAR | Status: AC
  Filled 2017-06-15: qty 4

## 2017-06-15 MED ORDER — DEXAMETHASONE SODIUM PHOSPHATE 10 MG/ML IJ SOLN
INTRAMUSCULAR | Status: DC | PRN
  Administered 2017-06-15: 10 mg via INTRAVENOUS

## 2017-06-15 MED ORDER — HYDROMORPHONE HCL 1 MG/ML IJ SOLN
INTRAMUSCULAR | Status: AC
Start: 2017-06-15 — End: ?
  Filled 2017-06-15: qty 1

## 2017-06-15 MED ORDER — FAMOTIDINE 20 MG PO TABS
20.0000 mg | ORAL_TABLET | Freq: Once | ORAL | Status: AC
  Administered 2017-06-15: 20 mg via ORAL

## 2017-06-15 MED ORDER — LIDOCAINE HCL (CARDIAC) PF 100 MG/5ML IV SOSY
PREFILLED_SYRINGE | INTRAVENOUS | Status: DC | PRN
  Administered 2017-06-15: 50 mg via INTRAVENOUS

## 2017-06-15 MED ORDER — SEVOFLURANE IN SOLN
RESPIRATORY_TRACT | Status: AC
  Filled 2017-06-15: qty 250

## 2017-06-15 MED ORDER — ACETAMINOPHEN 10 MG/ML IV SOLN
INTRAVENOUS | Status: AC
  Filled 2017-06-15: qty 100

## 2017-06-15 MED ORDER — ACETAMINOPHEN 10 MG/ML IV SOLN
INTRAVENOUS | Status: DC | PRN
  Administered 2017-06-15: 1000 mg via INTRAVENOUS

## 2017-06-15 MED ORDER — FENTANYL CITRATE (PF) 100 MCG/2ML IJ SOLN
25.0000 ug | INTRAMUSCULAR | Status: DC | PRN
Start: 2017-06-15 — End: 2017-06-15
  Administered 2017-06-15 (×4): 25 ug via INTRAVENOUS

## 2017-06-15 MED ORDER — ONDANSETRON HCL 4 MG/2ML IJ SOLN
4.0000 mg | Freq: Once | INTRAMUSCULAR | Status: AC | PRN
  Administered 2017-06-15: 4 mg via INTRAVENOUS

## 2017-06-15 MED ORDER — SCOPOLAMINE 1 MG/3DAYS TD PT72
1.0000 | MEDICATED_PATCH | Freq: Once | TRANSDERMAL | Status: DC
  Administered 2017-06-15: 1.5 mg via TRANSDERMAL

## 2017-06-15 MED ORDER — LACTATED RINGERS IV SOLN
INTRAVENOUS | Status: DC
  Administered 2017-06-15 (×2): via INTRAVENOUS

## 2017-06-15 MED ORDER — PROPOFOL 10 MG/ML IV BOLUS
INTRAVENOUS | Status: AC
  Filled 2017-06-15: qty 20

## 2017-06-15 MED ORDER — PROPOFOL 10 MG/ML IV BOLUS
INTRAVENOUS | Status: DC | PRN
  Administered 2017-06-15: 50 mg via INTRAVENOUS
  Administered 2017-06-15: 150 mg via INTRAVENOUS

## 2017-06-15 SURGICAL SUPPLY — 22 items
BAG URINE DRAINAGE (UROLOGICAL SUPPLIES) IMPLANT
CANISTER SUC SOCK COL 7IN (MISCELLANEOUS) ×2 IMPLANT
CATH FOLEY 2WAY  5CC 16FR (CATHETERS)
CATH ROBINSON RED A/P 16FR (CATHETERS) ×2 IMPLANT
CATH URTH 16FR FL 2W BLN LF (CATHETERS) IMPLANT
DEVICE MYOSURE LITE (MISCELLANEOUS) ×2 IMPLANT
DEVICE MYOSURE REACH (MISCELLANEOUS) IMPLANT
ELECT REM PT RETURN 9FT ADLT (ELECTROSURGICAL) ×2
ELECTRODE REM PT RTRN 9FT ADLT (ELECTROSURGICAL) ×1 IMPLANT
GLOVE BIO SURGEON STRL SZ7 (GLOVE) ×2 IMPLANT
GLOVE BIOGEL PI IND STRL 7.5 (GLOVE) ×1 IMPLANT
GLOVE BIOGEL PI INDICATOR 7.5 (GLOVE) ×1
GOWN STRL REUS W/ TWL LRG LVL3 (GOWN DISPOSABLE) ×1 IMPLANT
GOWN STRL REUS W/TWL LRG LVL3 (GOWN DISPOSABLE) ×1
IV LACTATED RINGER IRRG 3000ML (IV SOLUTION) ×1
IV LR IRRIG 3000ML ARTHROMATIC (IV SOLUTION) ×1 IMPLANT
KIT TURNOVER CYSTO (KITS) ×2 IMPLANT
PACK DNC HYST (MISCELLANEOUS) ×2 IMPLANT
PAD OB MATERNITY 4.3X12.25 (PERSONAL CARE ITEMS) ×2 IMPLANT
PAD PREP 24X41 OB/GYN DISP (PERSONAL CARE ITEMS) ×2 IMPLANT
TUBING CONNECTING 10 (TUBING) ×2 IMPLANT
TUBING HYSTEROSCOPY DOLPHIN (MISCELLANEOUS) ×2 IMPLANT

## 2017-06-15 NOTE — Op Note (Signed)
Operative Note   06/15/2017  PRE-OP DIAGNOSIS:  1) Postmenopausal bleeding 2) uterine cervical stenosis   POST-OP DIAGNOSIS:  1) Postmenopausal bleeding 2) uterine cervical stenosis 3) endometrial polyp   SURGEON: Surgeon(s) and Role:    [name], MD - Primary  PROCEDURE: Procedure(s): 1) Dilation and Curettage 2) hysteroscopy 3) polypectomy  ANESTHESIA: General   ESTIMATED BLOOD LOSS: Minimal  DRAINS: none   TOTAL IV FLUIDS: 1,300  SPECIMENS:  1) Endometrial polypoid lesions 2) endometrial curettings  VTE PROPHYLAXIS: SCDs to the bilateral lower extremities  ANTIBIOTICS: none indicated nor given  FLUID DEFICIT: minimal  COMPLICATIONS: none  DISPOSITION: PACU - hemodynamically stable.  CONDITION: stable  INDICATION: Postmenopausal bleeding, cervical stenosis  FINDINGS: Exam under anesthesia revealed small, mobile uterus with no masses and bilateral adnexa without masses or fullness. Hysteroscopy revealed a small right tubal ostium polypoid lesion and a right lower uterine segment posterior flat broad-based lesion with otherwise grossly normal appearing uterine cavity with bilateral tubal ostia and normal appearing endocervical canal.  Cervical stenosis also noted.   PROCEDURE IN DETAIL:  After informed consent was obtained, the patient was taken to the operating room where anesthesia was obtained without difficulty. The patient was positioned in the dorsal lithotomy position in candy cane stirrups.  The patient's bladder was catheterized with an in-and-out foley catheter.  The patient was examined under anesthesia, with the above noted findings.  The bi-valved speculum was placed inside the patient's vagina, and the the anterior lip of the cervix was seen and grasped with the tenaculum.  The cervix was progressively dilated to a 7 mm Hegar dilator.  The hysteroscope was introduced, with the above noted findings.  Given the right tubal ostium polypoid lesion  and flat and broad-based polypoid lesion, the MyoSure Light device was utilized to remove the lesions.    The hystersocope was removed and the uterine cavity was curetted until a gritty texture was noted, yielding minimal endometrial curettings.  Excellent hemostasis was noted, and all instruments were removed, with excellent hemostasis noted throughout after application of silver nitrate to the sites where the single-tooth tenaculum was applied.  The vagina was inspected and verified to be free of any instruments or sponges. She was then taken out of dorsal lithotomy.  The patient tolerated the procedure well.  Sponge, lap and needle counts were correct x2.  VTE prophylaxis: SCDs.  Antibiotic prophylaxis: none indicated and none given.  The patient was taken to recovery room in stable condition.  [name], MD, [facility] 06/15/2017 8:33 AM

## 2017-06-15 NOTE — H&P (Signed)
History and Physical Interval Note:  Amanda Skinner  has presented today for surgery, with the diagnosis of post menopausal bleeding  The various methods of treatment have been discussed with the patient and family. After consideration of risks, benefits and other options for treatment, the patient has consented to  Procedure(s): [place] (N/A) as a surgical intervention .  The patient's history has been reviewed, patient examined, no change in status, stable for surgery.  I have reviewed the patient's chart and labs.  Questions were answered to the patient's satisfaction.    Note: Original H&P written on 06/10/17.   Prentice Docker, MD 06/15/2017 7:19 AM

## 2017-06-15 NOTE — Anesthesia Postprocedure Evaluation (Signed)
Anesthesia Post Note  Patient: Amanda Skinner  Procedure(s) Performed: DILATATION & CURETTAGE/HYSTEROSCOPY WITH POLYPECTOMY (N/A )  Patient location during evaluation: PACU Anesthesia Type: General Level of consciousness: awake and alert Pain management: pain level controlled Vital Signs Assessment: post-procedure vital signs reviewed and stable Respiratory status: spontaneous breathing and respiratory function stable Cardiovascular status: stable Anesthetic complications: no     Last Vitals:  Vitals:   06/15/17 0925 06/15/17 0933  BP: 128/78   Pulse: 65   Resp: 13   Temp:  36.4 C  SpO2: 98%     Last Pain:  Vitals:   06/15/17 0925  TempSrc:   PainSc: 2                  Harue Pribble K

## 2017-06-15 NOTE — Anesthesia Procedure Notes (Signed)
Procedure Name: LMA Insertion Date/Time: 06/15/2017 7:36 AM Performed by: Nile Riggs, CRNA Pre-anesthesia Checklist: Patient identified, Emergency Drugs available, Suction available, Patient being monitored and Timeout performed Patient Re-evaluated:Patient Re-evaluated prior to induction Oxygen Delivery Method: Circle system utilized Preoxygenation: Pre-oxygenation with 100% oxygen Induction Type: IV induction Ventilation: Mask ventilation without difficulty LMA: LMA inserted LMA Size: 3.5 Tube type: Oral Number of attempts: 1 Placement Confirmation: CO2 detector,  positive ETCO2 and breath sounds checked- equal and bilateral Tube secured with: Tape Dental Injury: Teeth and Oropharynx as per pre-operative assessment

## 2017-06-15 NOTE — Anesthesia Post-op Follow-up Note (Signed)
Anesthesia QCDR form completed.        

## 2017-06-15 NOTE — Discharge Instructions (Signed)
AMBULATORY SURGERY  °DISCHARGE INSTRUCTIONS ° ° °1) The drugs that you were given will stay in your system until tomorrow so for the next 24 hours you should not: ° °A) Drive an automobile °B) Make any legal decisions °C) Drink any alcoholic beverage ° ° °2) You may resume regular meals tomorrow.  Today it is better to start with liquids and gradually work up to solid foods. ° °You may eat anything you prefer, but it is better to start with liquids, then soup and crackers, and gradually work up to solid foods. ° ° °3) Please notify your doctor immediately if you have any unusual bleeding, trouble breathing, redness and pain at the surgery site, drainage, fever, or pain not relieved by medication. ° ° ° °4) Additional Instructions: ° ° ° ° ° ° ° °Please contact your physician with any problems or Same Day Surgery at [phone], Monday through Friday 6 am to 4 pm, or [place] [facility] [place] Main number at [phone]. °

## 2017-06-15 NOTE — Transfer of Care (Signed)
Immediate Anesthesia Transfer of Care Note  Patient: Amanda Skinner  Procedure(s) Performed: DILATATION & CURETTAGE/HYSTEROSCOPY WITH POLYPECTOMY (N/A )  Patient Location: PACU  Anesthesia Type:General  Level of Consciousness: drowsy and patient cooperative  Airway & Oxygen Therapy: Patient Spontanous Breathing and Patient connected to face mask oxygen  Post-op Assessment: Report given to RN, Post -op Vital signs reviewed and stable and Patient moving all extremities  Post vital signs: Reviewed and stable  Last Vitals:  Vitals Value Taken Time  BP 120/67 06/15/2017  8:39 AM  Temp    Pulse 66 06/15/2017  8:41 AM  Resp 10 06/15/2017  8:41 AM  SpO2 100 % 06/15/2017  8:41 AM  Vitals shown include unvalidated device data.  Last Pain:  Vitals:   06/15/17 0625  TempSrc: Oral  PainSc: 2          Complications: No apparent anesthesia complications

## 2017-06-15 NOTE — Anesthesia Preprocedure Evaluation (Signed)
Anesthesia Evaluation  Patient identified by MRN, date of birth, ID band Patient awake    Reviewed: Allergy & Precautions, NPO status , Patient's Chart, lab work & pertinent test results  History of Anesthesia Complications (+) PONV and history of anesthetic complications  Airway Mallampati: II       Dental   Pulmonary asthma , neg sleep apnea, neg COPD,           Cardiovascular (-) hypertension(-) Past MI and (-) CHF (-) dysrhythmias (-) Valvular Problems/Murmurs     Neuro/Psych neg Seizures Depression    GI/Hepatic Neg liver ROS, GERD  Medicated and Controlled,  Endo/Other  neg diabetes  Renal/GU negative Renal ROS     Musculoskeletal   Abdominal   Peds  Hematology   Anesthesia Other Findings   Reproductive/Obstetrics                             Anesthesia Physical Anesthesia Plan  ASA: II  Anesthesia Plan: General   Post-op Pain Management:    Induction:   PONV Risk Score and Plan: 4 or greater and Dexamethasone, Ondansetron, Midazolam and Scopolamine patch - Pre-op  Airway Management Planned: LMA  Additional Equipment:   Intra-op Plan:   Post-operative Plan:   Informed Consent: I have reviewed the patients History and Physical, chart, labs and discussed the procedure including the risks, benefits and alternatives for the proposed anesthesia with the patient or authorized representative who has indicated his/her understanding and acceptance.     Plan Discussed with:   Anesthesia Plan Comments:         Anesthesia Quick Evaluation

## 2017-06-16 ENCOUNTER — Encounter: Payer: Self-pay | Admitting: Obstetrics and Gynecology

## 2017-06-16 LAB — SURGICAL PATHOLOGY

## 2017-06-17 ENCOUNTER — Telehealth: Payer: Self-pay | Admitting: Obstetrics and Gynecology

## 2017-06-17 NOTE — Telephone Encounter (Signed)
Discussed pathology results with patient. She has not had any pain apart from a small twinge in her side occasionally.  No other issues. She is back at work today. I recommended that she keep her follow-up appt.

## 2017-06-17 NOTE — Telephone Encounter (Signed)
Left generic VM 

## 2017-07-05 ENCOUNTER — Encounter: Payer: Self-pay | Admitting: Obstetrics and Gynecology

## 2017-07-05 ENCOUNTER — Ambulatory Visit (INDEPENDENT_AMBULATORY_CARE_PROVIDER_SITE_OTHER): Payer: 59 | Admitting: Obstetrics and Gynecology

## 2017-07-05 VITALS — BP 110/80 | HR 66 | Ht 60.0 in | Wt 117.0 lb

## 2017-07-05 DIAGNOSIS — N840 Polyp of corpus uteri: Secondary | ICD-10-CM

## 2017-07-05 DIAGNOSIS — N950 Postmenopausal bleeding: Secondary | ICD-10-CM

## 2017-07-05 DIAGNOSIS — Z09 Encounter for follow-up examination after completed treatment for conditions other than malignant neoplasm: Secondary | ICD-10-CM

## 2017-07-05 NOTE — Progress Notes (Signed)
   Postoperative Follow-up Patient presents post op from hysteroscopy, dilation and curettage 3weeks ago for postmenopausal bleeding.  Subjective: Patient reports marked improvement in her preop symptoms. Eating a regular diet without difficulty. The patient is not having any pain.  Activity: normal activities of daily living.  Objective: Vitals:   07/05/17 1126  BP: 110/80  Pulse: 66   Vital Signs: BP 110/80   Pulse 66   Ht 5' (1.524 m)   Wt 117 lb (53.1 kg)   LMP 06/15/2011 (Approximate)   BMI 22.85 kg/m  Constitutional: Well nourished, well developed female in no acute distress.  HEENT: normal Skin: Warm and dry.  Extremity: no edema  Abdomen: Soft, non-tender, normal bowel sounds; no bruits, organomegaly or masses.  Assessment: 57 y.o. s/p above procedure progressing well  Plan: Patient has done well after surgery with no apparent complications.  I have discussed the post-operative course to date, and the expected progress moving forward.  The patient understands what complications to be concerned about.  I will see the patient in routine follow up, or sooner if needed.    Activity plan: No restriction.  Pathology reviewed. Endometrial polyp, atrophic endometrium.   Prentice Docker, MD 07/05/2017, 11:36 AM

## 2017-08-20 ENCOUNTER — Ambulatory Visit (INDEPENDENT_AMBULATORY_CARE_PROVIDER_SITE_OTHER): Payer: 59 | Admitting: Certified Nurse Midwife

## 2017-08-20 ENCOUNTER — Encounter: Payer: Self-pay | Admitting: Certified Nurse Midwife

## 2017-08-20 VITALS — BP 130/80 | HR 74 | Ht 61.0 in | Wt 118.0 lb

## 2017-08-20 DIAGNOSIS — N952 Postmenopausal atrophic vaginitis: Secondary | ICD-10-CM

## 2017-08-20 DIAGNOSIS — Z01411 Encounter for gynecological examination (general) (routine) with abnormal findings: Secondary | ICD-10-CM | POA: Diagnosis not present

## 2017-08-20 DIAGNOSIS — Z1231 Encounter for screening mammogram for malignant neoplasm of breast: Secondary | ICD-10-CM | POA: Diagnosis not present

## 2017-08-20 DIAGNOSIS — Z1239 Encounter for other screening for malignant neoplasm of breast: Secondary | ICD-10-CM

## 2017-08-20 DIAGNOSIS — Z01419 Encounter for gynecological examination (general) (routine) without abnormal findings: Secondary | ICD-10-CM

## 2017-08-20 MED ORDER — ESTRADIOL 0.1 MG/GM VA CREA: TOPICAL_CREAM | VAGINAL | 2 refills | Status: DC
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
## 2017-08-21 ENCOUNTER — Encounter: Payer: Self-pay | Admitting: Certified Nurse Midwife

## 2018-01-04 DIAGNOSIS — D229 Melanocytic nevi, unspecified: Secondary | ICD-10-CM | POA: Diagnosis not present

## 2018-01-04 DIAGNOSIS — L918 Other hypertrophic disorders of the skin: Secondary | ICD-10-CM | POA: Diagnosis not present

## 2018-01-04 DIAGNOSIS — Z1283 Encounter for screening for malignant neoplasm of skin: Secondary | ICD-10-CM | POA: Diagnosis not present

## 2018-01-04 DIAGNOSIS — L820 Inflamed seborrheic keratosis: Secondary | ICD-10-CM | POA: Diagnosis not present

## 2018-01-04 DIAGNOSIS — L812 Freckles: Secondary | ICD-10-CM | POA: Diagnosis not present

## 2018-01-04 DIAGNOSIS — L578 Other skin changes due to chronic exposure to nonionizing radiation: Secondary | ICD-10-CM | POA: Diagnosis not present

## 2018-01-04 DIAGNOSIS — L570 Actinic keratosis: Secondary | ICD-10-CM | POA: Diagnosis not present

## 2018-01-04 DIAGNOSIS — D1800 Hemangioma unspecified site: Secondary | ICD-10-CM | POA: Diagnosis not present

## 2018-01-04 DIAGNOSIS — I788 Other diseases of capillaries: Secondary | ICD-10-CM | POA: Diagnosis not present

## 2018-02-08 DIAGNOSIS — H524 Presbyopia: Secondary | ICD-10-CM | POA: Diagnosis not present

## 2018-04-11 ENCOUNTER — Encounter: Payer: Self-pay | Admitting: Family Medicine

## 2018-04-11 ENCOUNTER — Ambulatory Visit (INDEPENDENT_AMBULATORY_CARE_PROVIDER_SITE_OTHER): Payer: 59 | Admitting: Family Medicine

## 2018-04-11 VITALS — BP 110/70 | HR 68 | Temp 97.6°F | Ht 59.5 in | Wt 123.6 lb

## 2018-04-11 DIAGNOSIS — Z13220 Encounter for screening for lipoid disorders: Secondary | ICD-10-CM | POA: Diagnosis not present

## 2018-04-11 DIAGNOSIS — N644 Mastodynia: Secondary | ICD-10-CM

## 2018-04-11 DIAGNOSIS — J3089 Other allergic rhinitis: Secondary | ICD-10-CM

## 2018-04-11 DIAGNOSIS — Z0001 Encounter for general adult medical examination with abnormal findings: Secondary | ICD-10-CM | POA: Diagnosis not present

## 2018-04-11 DIAGNOSIS — N6019 Diffuse cystic mastopathy of unspecified breast: Secondary | ICD-10-CM | POA: Diagnosis not present

## 2018-04-11 MED ORDER — FLUTICASONE PROPIONATE 50 MCG/ACT NA SUSP: 2.0000 | NASAL | 6 refills | Status: DC | PRN

## 2018-04-11 NOTE — Patient Instructions (Signed)
Nice to see you. We will get a diagnostic mammogram and ultrasound. Please think about what surgeon you would prefer to see. We will get lab work today and contact you with results. Please check on insurance coverage for Shingrix.

## 2018-04-12 LAB — COMPREHENSIVE METABOLIC PANEL
ALBUMIN: 4.4 g/dL (ref 3.5–5.2)
ALT: 16 U/L (ref 0–35)
AST: 18 U/L (ref 0–37)
Alkaline Phosphatase: 73 U/L (ref 39–117)
BUN: 17 mg/dL (ref 6–23)
CHLORIDE: 101 meq/L (ref 96–112)
CO2: 29 mEq/L (ref 19–32)
Calcium: 9.5 mg/dL (ref 8.4–10.5)
Creatinine, Ser: 0.95 mg/dL (ref 0.40–1.20)
GFR: 60.29 mL/min (ref >60.00)
Glucose, Bld: 81 mg/dL (ref 70–99)
POTASSIUM: 3.8 meq/L (ref 3.5–5.1)
Sodium: 138 mEq/L (ref 135–145)
TOTAL PROTEIN: 7 g/dL (ref 6.0–8.3)
Total Bilirubin: 0.3 mg/dL (ref 0.2–1.2)

## 2018-04-12 LAB — LIPID PANEL
CHOLESTEROL: 176 mg/dL (ref 0–200)
HDL: 76.1 mg/dL (ref >39.00)
LDL Cholesterol: 90 mg/dL (ref 0–99)
NonHDL: 100.26
Total CHOL/HDL Ratio: 2
Triglycerides: 51 mg/dL (ref 0.0–149.0)
VLDL: 10.2 mg/dL (ref 0.0–40.0)

## 2018-04-12 NOTE — Assessment & Plan Note (Signed)
Physical exam completed.  Encouraged continued diet and exercise.  Diagnostic mammogram will be scheduled.  Discussed Shingrix and patient will check with her insurance regarding coverage.  Lab work as outlined below.

## 2018-04-12 NOTE — Progress Notes (Signed)
Tommi Rumps, MD Phone: [phone]  Amanda Skinner is a 59 y.o. female who presents today for CPE.  Exercises by doing weights and cardio multiple days a week. Diet is healthy with egg whites and [place].  Occasional ground beef.  Lots of fruits and vegetables.  No soda. Mammogram negative on 04/26/2017.  Amanda Skinner was followed by general surgery for diffuse cystic mastopathy.  Amanda Skinner does note recently Amanda Skinner has had some tenderness in her breast.  Notes they were worse last week though slightly improved this week.  Notes some pain and heaviness in her bilateral breasts. Follows with gynecology for Pap smears.  Negative cells in 2018. Colonoscopy 08/21/2013.  Reports nothing found.  10-year recall. No family history of breast, ovarian, or colon cancer. Tetanus vaccine in 2016.  Flu vaccine up-to-date through work.  Due for Shingrix. Hepatitis C and HIV screening completed. No tobacco use or illicit drug use.  Rare alcohol use. Sees a dentist 4 times a year.  Ophthalmologist once yearly.  Active Ambulatory Problems    Diagnosis Date Noted  . Diffuse cystic mastopathy 05/11/2012  . Bilateral hand pain 10/08/2014  . Mild intermittent asthma 10/09/2014  . Allergic rhinitis 10/09/2014  . Musculoskeletal chest pain 01/29/2015  . Atypical chest pain 02/01/2015  . Family history of coronary artery disease 02/01/2015  . Morton's metatarsalgia, neuralgia, 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  . Ringworm 06/05/2015  . Fibromyalgia 06/05/2015  . GERD (gastroesophageal reflux disease) 01/01/2016  . Encounter for general adult medical examination with abnormal findings 04/03/2016  . Postmenopausal atrophic vaginitis 08/03/2016  . Endometrial polyp 06/15/2017   Resolved Ambulatory Problems    Diagnosis Date Noted  . Unstable angina ([place]) 01/21/2015  . Postmenopausal bleeding 06/15/2017   Past Medical History:  Diagnosis Date  . Asthma 2001  . Chronic sinusitis   . Complication of anesthesia   . Depression     . PONV (postoperative nausea and vomiting)   . Seasonal allergies   . TMJ (dislocation of temporomandibular joint) 2012  . Ulcer     Family History  Problem Relation Age of Onset  . Heart attack Mother   . Heart disease Mother        died from aortic dissection during CABG surgery  . Hypertension Mother   . Stroke Mother   . Heart disease Maternal Grandmother   . Heart disease Maternal Uncle   . Breast cancer Neg Hx     Social History   Socioeconomic History  . Marital status: Married    Spouse name: Nassar  . Number of children: 2  . Years of education: Not on file  . Highest education level: Not on file  Occupational History  . Occupation: Equities trader  Social Needs  . Financial resource strain: Not on file  . Food insecurity:    Worry: Not on file    Inability: Not on file  . Transportation needs:    Medical: Not on file    Non-medical: Not on file  Tobacco Use  . Smoking status: Never Smoker  . Smokeless tobacco: Never Used  Substance and Sexual Activity  . Alcohol use: Yes    Comment: rarely  . Drug use: No  . Sexual activity: Yes    Birth control/protection: Post-menopausal  Lifestyle  . Physical activity:    Days per week: 7 days    Minutes per session: 30 min  . Stress: Not on file  Relationships  . Social connections:    Talks on phone: Not on  file    Gets together: Not on file    Attends religious service: Not on file    Active member of club or organization: Not on file    Attends meetings of clubs or organizations: Not on file    Relationship status: Not on file  . Intimate partner violence:    Fear of current or ex partner: Not on file    Emotionally abused: Not on file    Physically abused: Not on file    Forced sexual activity: Not on file  Other Topics Concern  . Not on file  Social History Narrative  . Not on file    ROS  General:  Negative for nexplained weight loss, fever Skin: Negative for new or changing mole, sore  that won't heal HEENT: Negative for trouble hearing, trouble seeing, ringing in ears, mouth sores, hoarseness, change in voice, dysphagia. CV:  Negative for chest pain, dyspnea, edema, palpitations Resp: Negative for cough, dyspnea, hemoptysis GI: Negative for nausea, vomiting, diarrhea, constipation, abdominal pain, melena, hematochezia. GU: Positive for lumps in breasts, negative for dysuria, incontinence, urinary hesitance, hematuria, vaginal or penile discharge, polyuria, sexual difficulty MSK: Negative for muscle cramps or aches, joint pain or swelling Neuro: Negative for headaches, weakness, numbness, dizziness, passing out/fainting Psych: Negative for depression, anxiety, memory problems  Objective  Physical Exam Vitals:   04/11/18 1534  BP: 110/70  Pulse: 68  Temp: 97.6 F (36.4 C)  SpO2: 97%    BP Readings from Last 3 Encounters:  04/11/18 110/70  08/20/17 130/80  07/05/17 110/80   Wt Readings from Last 3 Encounters:  04/11/18 123 lb 9.6 oz (56.1 kg)  08/20/17 118 lb (53.5 kg)  07/05/17 117 lb (53.1 kg)    Physical Exam Constitutional:      General: Amanda Skinner is not in acute distress.    Appearance: Amanda Skinner is not diaphoretic.  HENT:     Head: Normocephalic and atraumatic.     Mouth/Throat:     Mouth: Mucous membranes are moist.     Pharynx: Oropharynx is clear.  Eyes:     Conjunctiva/sclera: Conjunctivae normal.     Pupils: Pupils are equal, round, and reactive to light.  Cardiovascular:     Rate and Rhythm: Normal rate and regular rhythm.     Heart sounds: Normal heart sounds.  Pulmonary:     Effort: Pulmonary effort is normal.     Breath sounds: Normal breath sounds.  Abdominal:     General: Bowel sounds are normal. There is no distension.     Palpations: Abdomen is soft.     Tenderness: There is no abdominal tenderness. There is no guarding or rebound.  Genitourinary:    Comments: Chaperone used, right breast with tenderness and subtle underlying fullness in  the 8:00 to 12:00 region and underneath her nipple, left breast with scattered tenderness in all quadrants with possible underlying lesions noted in areas of tenderness, no nipple inversion or skin changes, no axillary masses Lymphadenopathy:     Cervical: No cervical adenopathy.  Skin:    General: Skin is warm and dry.  Neurological:     Mental Status: Amanda Skinner is alert.  Psychiatric:        Mood and Affect: Mood normal.      Assessment/Plan:   Encounter for general adult medical examination with abnormal findings Physical exam completed.  Encouraged continued diet and exercise.  Diagnostic mammogram will be scheduled.  Discussed Shingrix and patient will check with her insurance regarding coverage.  Lab work as outlined below.  Diffuse cystic mastopathy I suspect the patient's current issues are related to this.  Her prior surgeon retired and I offered referral to an Soil scientist though Amanda Skinner deferred this currently.  Amanda Skinner wanted to proceed with a mammogram and ultrasound to start.  I discussed the mammogram and ultrasound with our referral coordinator who will get these scheduled.   Orders Placed This Encounter  Procedures  . MM DIAG BREAST TOMO BILATERAL    Standing Status:   Future    Standing Expiration Date:   06/10/2019    Order Specific Question:   Reason for Exam (SYMPTOM  OR DIAGNOSIS REQUIRED)    Answer:   bilateral breast pain and history of diffuse cystic mastopathy    Order Specific Question:   Is the patient pregnant?    Answer:   No    Order Specific Question:   Preferred imaging location?    Answer:   [facility]  . US BREAST LTD UNI LEFT INC AXILLA    Standing Status:   Future    Standing Expiration Date:   06/10/2019    Order Specific Question:   Reason for Exam (SYMPTOM  OR DIAGNOSIS REQUIRED)    Answer:   bilateral breast pain and history of diffuse cystic mastopathy    Order Specific Question:   Preferred imaging location?    Answer:   [facility]  . US BREAST LTD UNI RIGHT INC AXILLA    Standing Status:   Future    Standing Expiration Date:   06/10/2019    Order Specific Question:   Reason for Exam (SYMPTOM  OR DIAGNOSIS REQUIRED)    Answer:   bilateral breast pain and history of diffuse cystic mastopathy    Order Specific Question:   Preferred imaging location?    Answer:   [facility]  . Comp Met (CMET)  . Lipid panel    Meds ordered this encounter  Medications  . fluticasone (FLONASE) 50 MCG/ACT nasal spray    Sig: Place 2 sprays into both nostrils as needed for allergies or rhinitis.    Dispense:  16 g    Refill:  [place], MD [place]

## 2018-04-12 NOTE — Assessment & Plan Note (Signed)
I suspect the patient's current issues are related to this.  Her prior surgeon retired and I offered referral to an Soil scientist though she deferred this currently.  She wanted to proceed with a mammogram and ultrasound to start.  I discussed the mammogram and ultrasound with our referral coordinator who will get these scheduled.

## 2018-04-14 ENCOUNTER — Telehealth: Payer: Self-pay

## 2018-04-14 NOTE — Telephone Encounter (Signed)
Called and spoke with pt about lab results. Results were normal. Pt advised.

## 2018-04-14 NOTE — Telephone Encounter (Signed)
Copied from [place] [phone]. Topic: General - Other >> Apr 14, 2018 11:32 AM Alanda Slim E wrote: Reason for CRM: Pt retuning [facility] phone call./ please advise

## 2018-04-15 ENCOUNTER — Ambulatory Visit
Admission: RE | Admit: 2018-04-15 | Discharge: 2018-04-15 | Disposition: A | Discharge status: Home / Self Care | Payer: 59 | Source: Ambulatory Visit | Attending: Family Medicine | Admitting: Family Medicine

## 2018-04-15 DIAGNOSIS — N644 Mastodynia: Secondary | ICD-10-CM | POA: Diagnosis not present

## 2018-04-15 DIAGNOSIS — N6002 Solitary cyst of left breast: Secondary | ICD-10-CM | POA: Diagnosis not present

## 2018-04-15 DIAGNOSIS — N6001 Solitary cyst of right breast: Secondary | ICD-10-CM | POA: Diagnosis not present

## 2018-04-15 DIAGNOSIS — R922 Inconclusive mammogram: Secondary | ICD-10-CM | POA: Diagnosis not present

## 2018-04-15 IMAGING — US ULTRASOUND RIGHT BREAST LIMITED
1 series · 6 of 6 positions shown · non-contrast
Comparison: Previous exam(s).

CLINICAL DATA: Patient complains of diffuse bilateral breast pain
and palpable abnormalities in both breast.

EXAM:
DIGITAL DIAGNOSTIC BILATERAL MAMMOGRAM WITH CAD AND TOMO
ULTRASOUND BILATERAL BREAST

[Series 1: ultrasound right breast limited · 0.06mm/px · 6 of 6 slices shown]
[im 1/6]
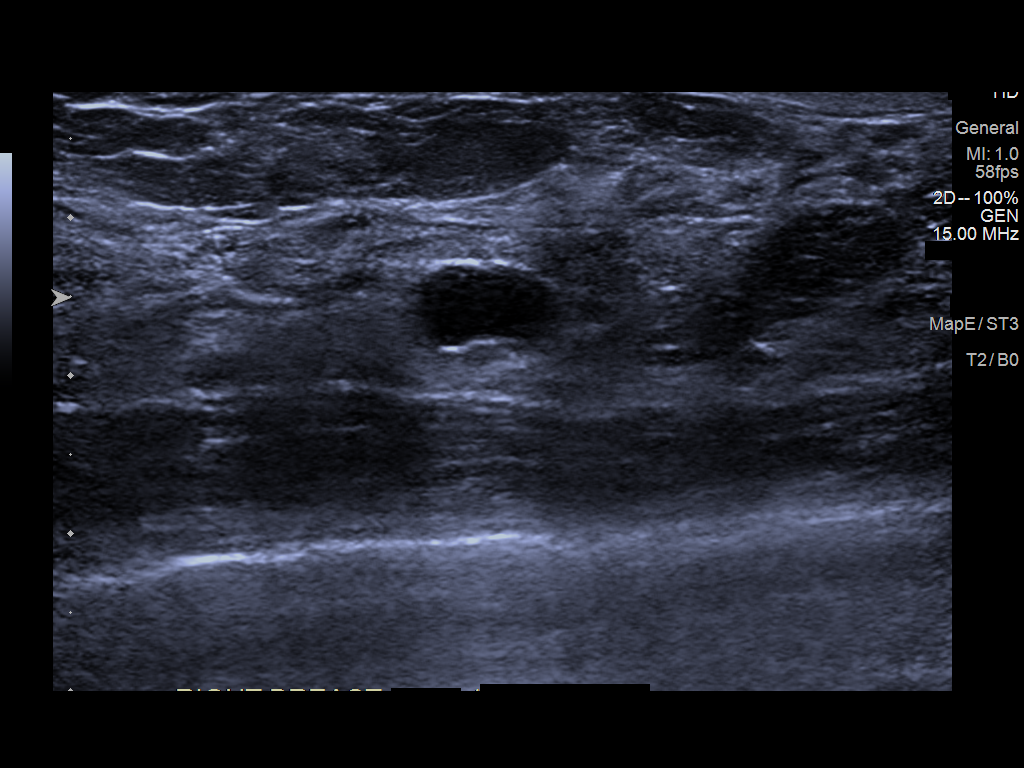
[im 2/6]
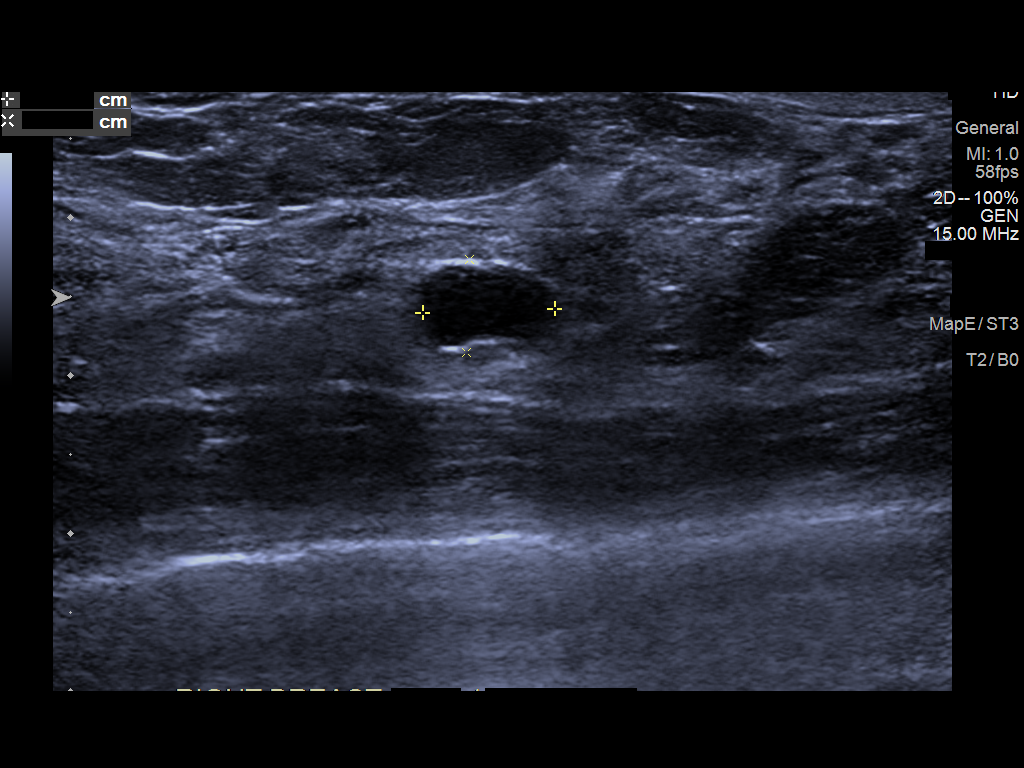
[im 3/6]
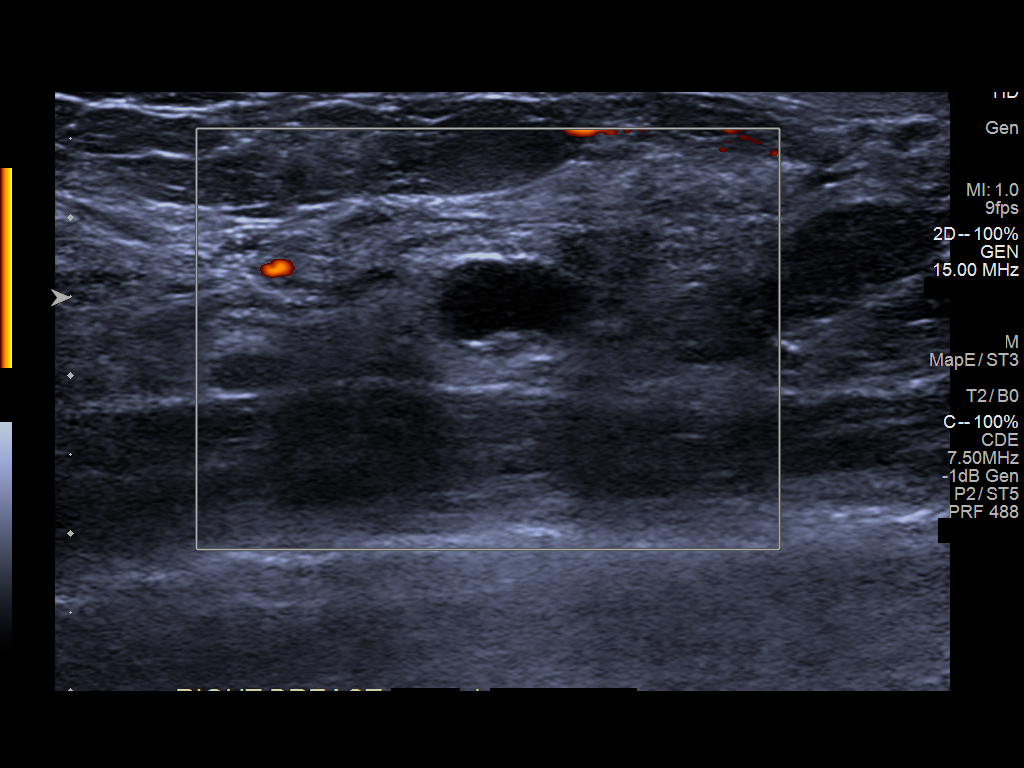
[im 4/6]
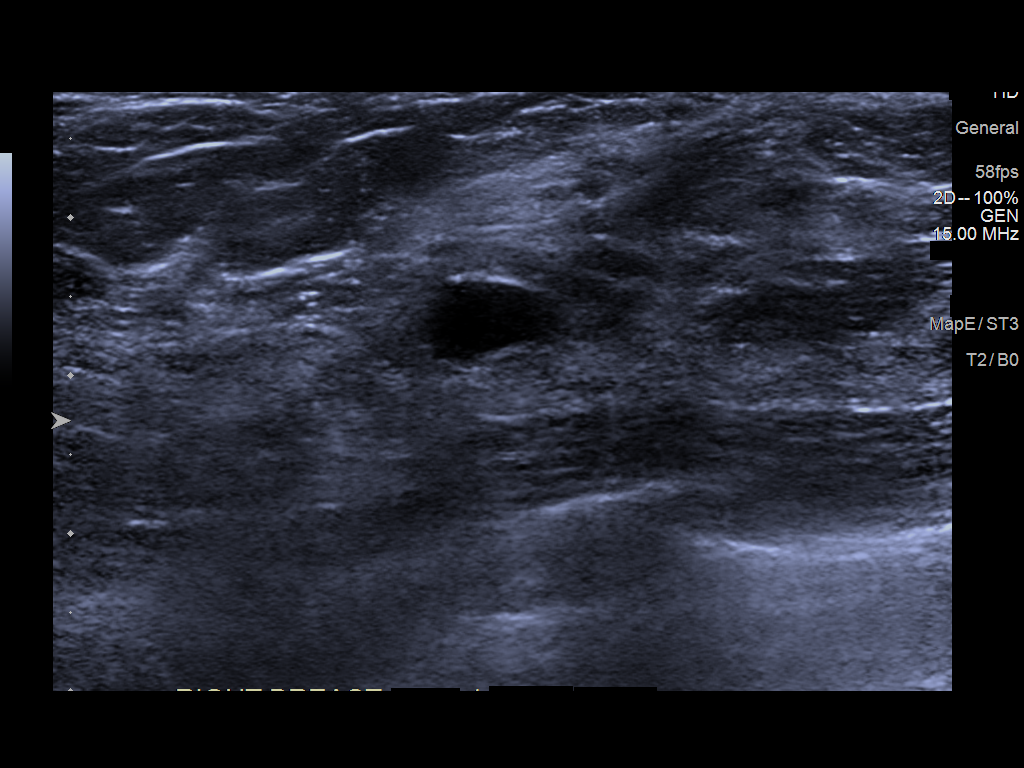
[im 5/6]
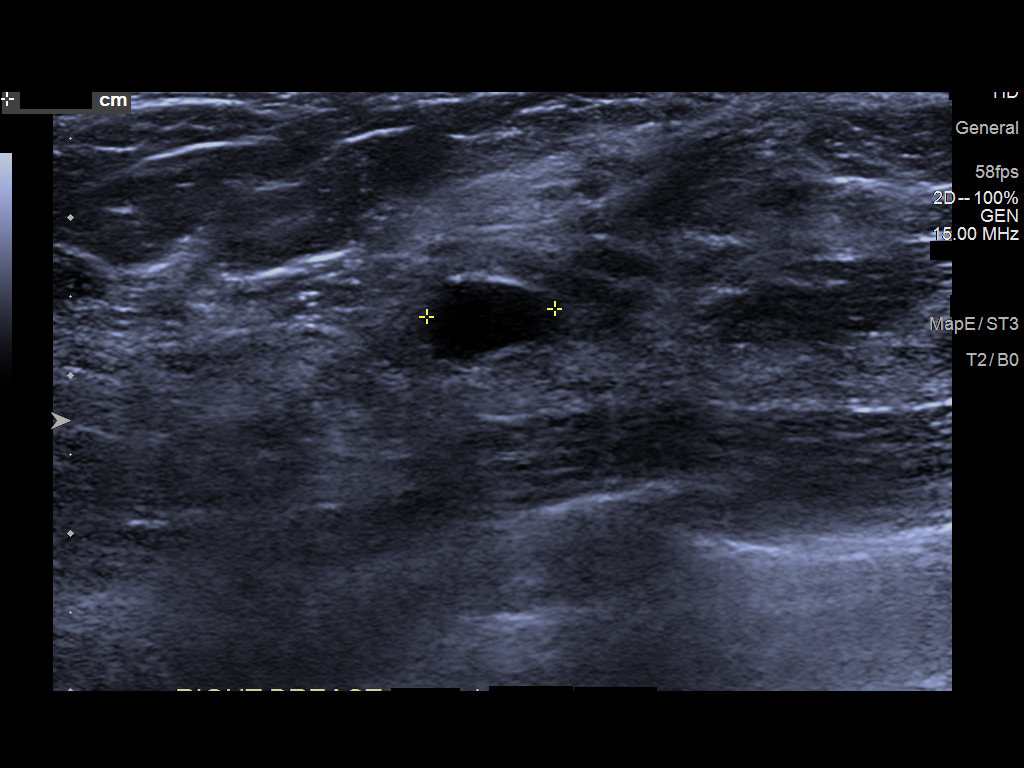
[im 6/6]
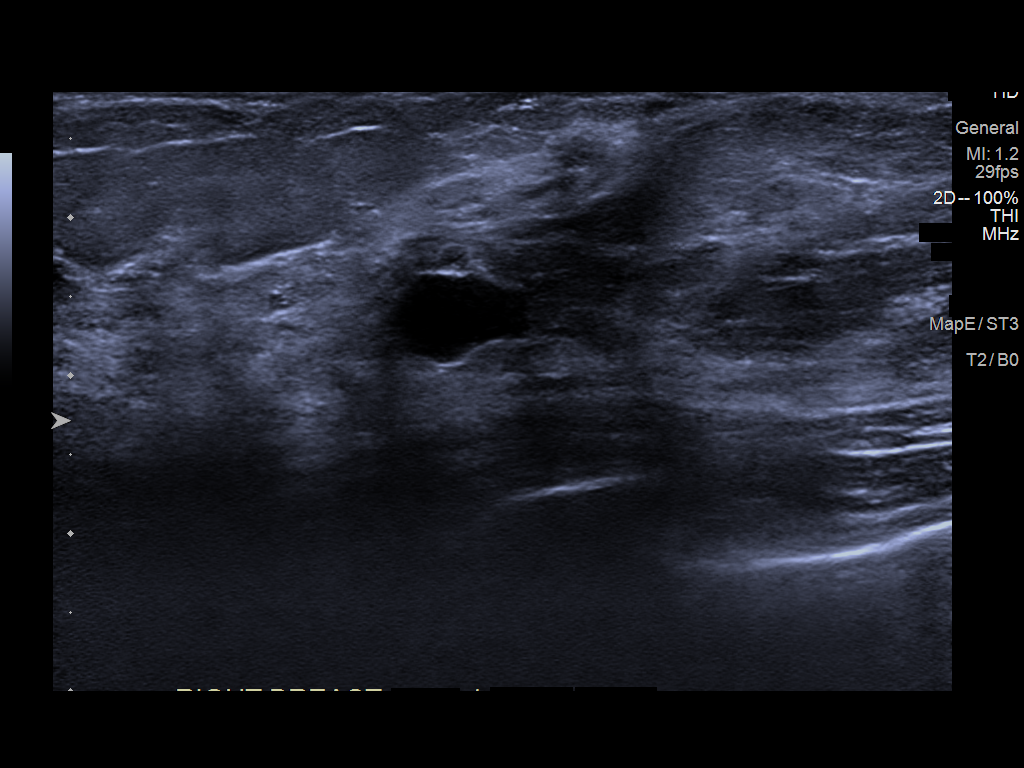

[6 of 6 positions shown; findings below may reference images not displayed]

ACR Breast Density Category c: The breast tissue is heterogeneously
dense, which may obscure small masses.
FINDINGS: No suspicious mass, malignant type microcalcifications or distortion
detected in either breast.

Mammographic images were processed with CAD.

Targeted ultrasound is performed, showing a well-circumscribed
anechoic cyst in the left breast at 1 o'clock 2 cm from the nipple
measuring 8 x 7 x 4 mm. Sonographic evaluation of the right breast
shows a well-circumscribed anechoic cyst in the right breast at 10
o'clock 1 cm from the nipple measuring 8 x 6 x 8 mm. No solid mass
or abnormal shadowing detected.
IMPRESSION: Bilateral breast cyst.  No evidence of malignancy in either breast.

RECOMMENDATION:
Bilateral screening mammogram in 1 year is recommended.

I have discussed the findings and recommendations with the patient.
Results were also provided in writing at the conclusion of the
visit. If applicable, a reminder letter will be sent to the patient
regarding the next appointment.

BI-RADS CATEGORY  2: Benign.

## 2018-04-15 IMAGING — MG DIGITAL DIAGNOSTIC BILATERAL MAMMOGRAM WITH TOMO AND CAD
6 of 12 series · 6 of 36 positions shown · non-contrast
Comparison: Previous exam(s).

CLINICAL DATA: Patient complains of diffuse bilateral breast pain
and palpable abnormalities in both breast.

EXAM:
DIGITAL DIAGNOSTIC BILATERAL MAMMOGRAM WITH CAD AND TOMO
ULTRASOUND BILATERAL BREAST

[L MLO synth-2D (1 of 2)]
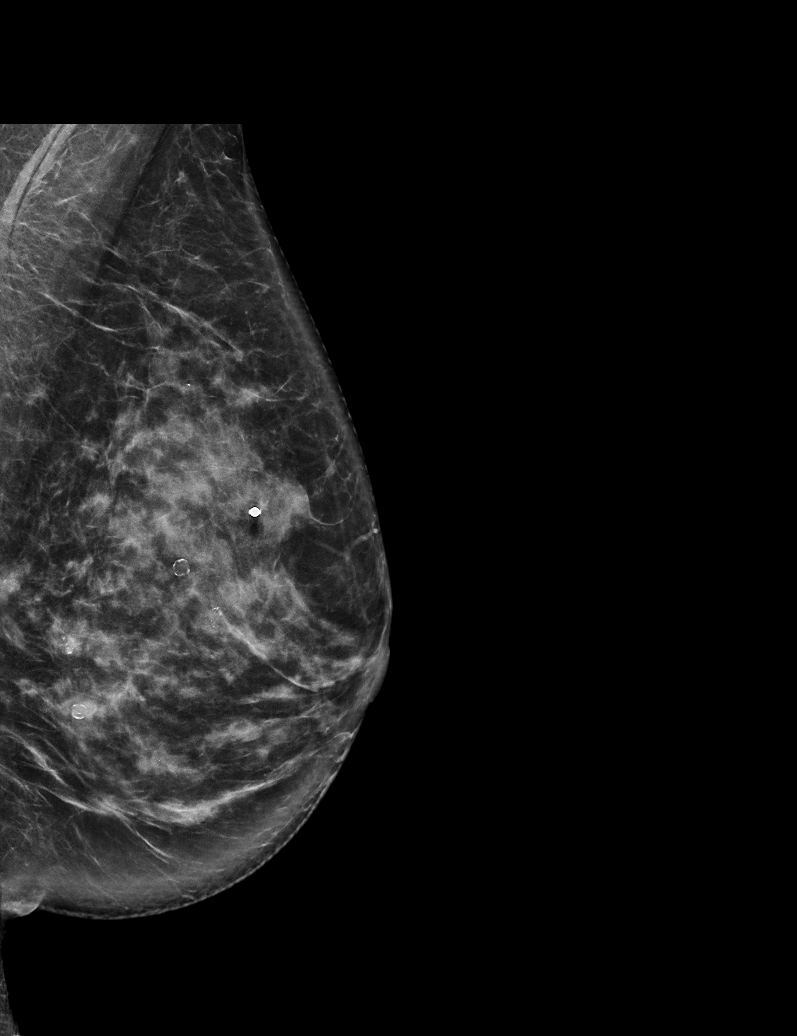

[L MLO synth-2D (2 of 2)]
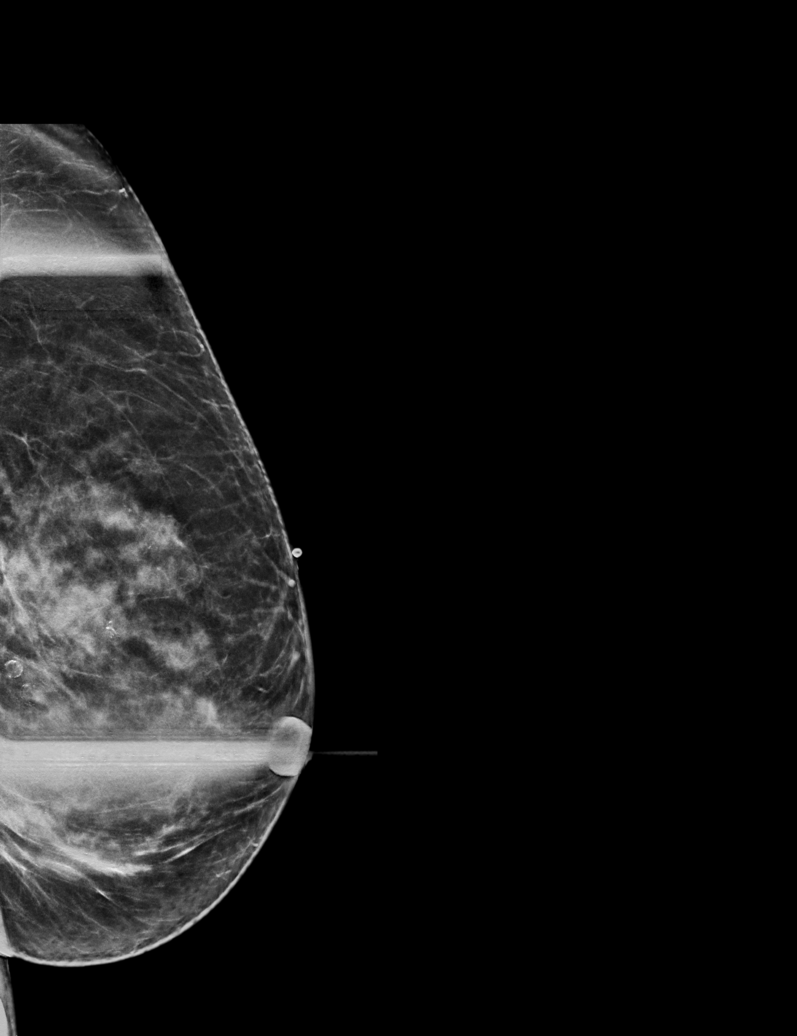

[R MLO synth-2D (1 of 2)]
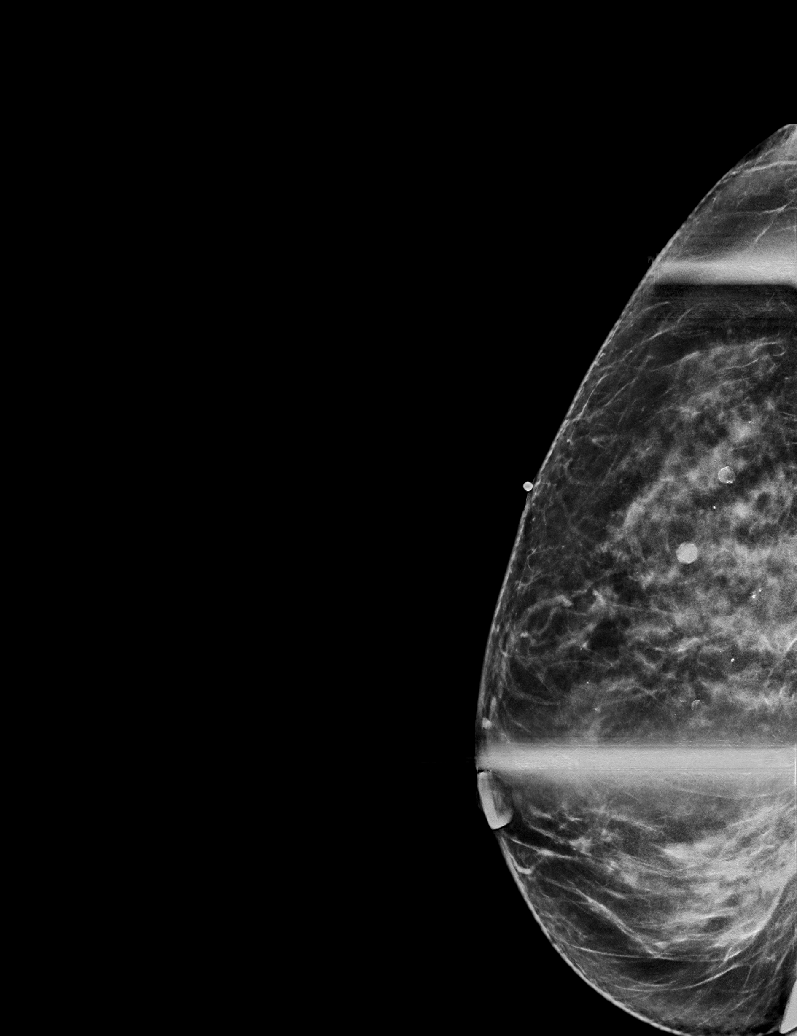

[R MLO synth-2D (2 of 2)]
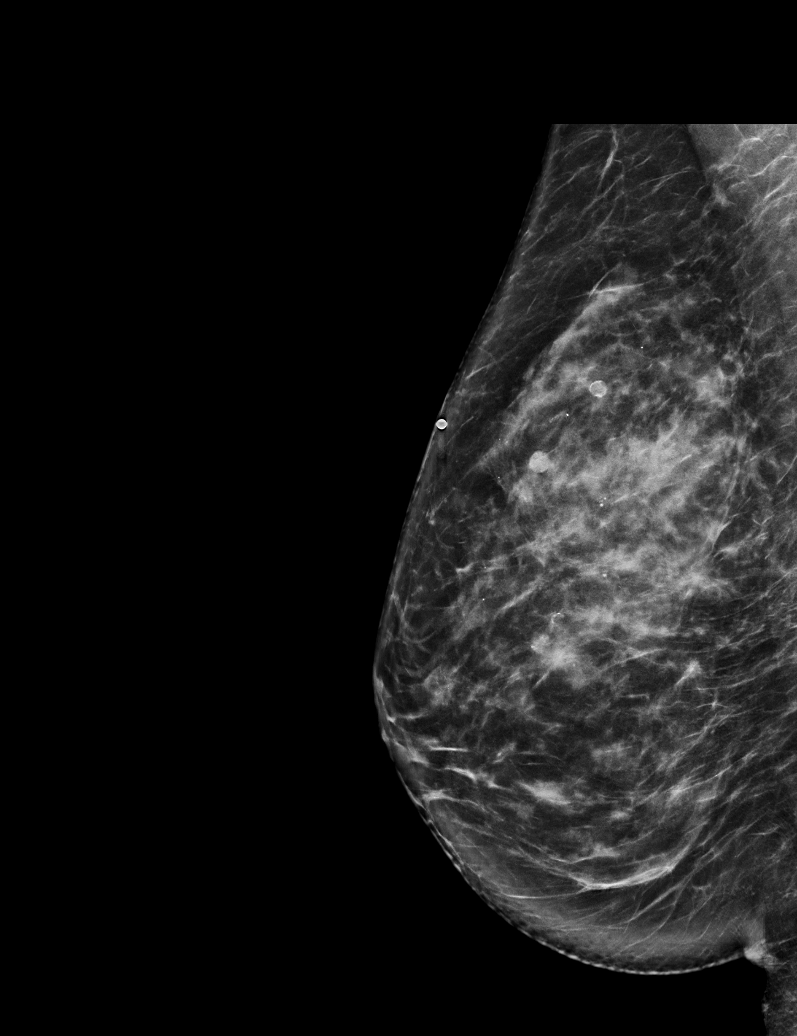

[L CC synth-2D]
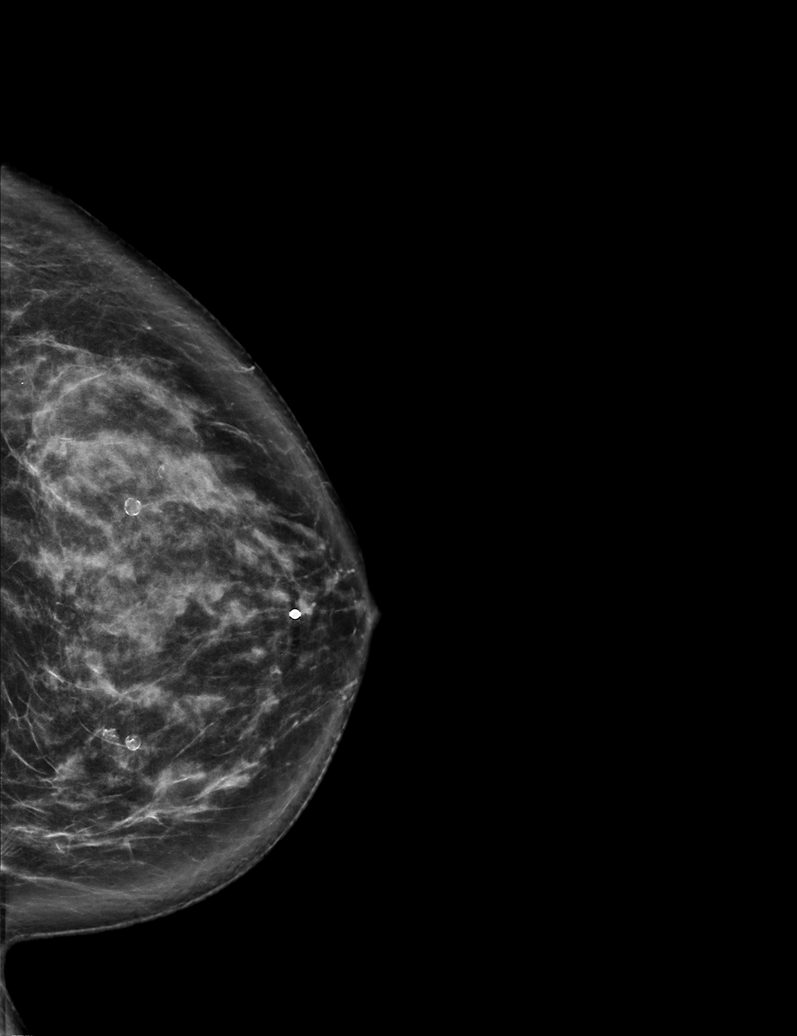

[R CC synth-2D]
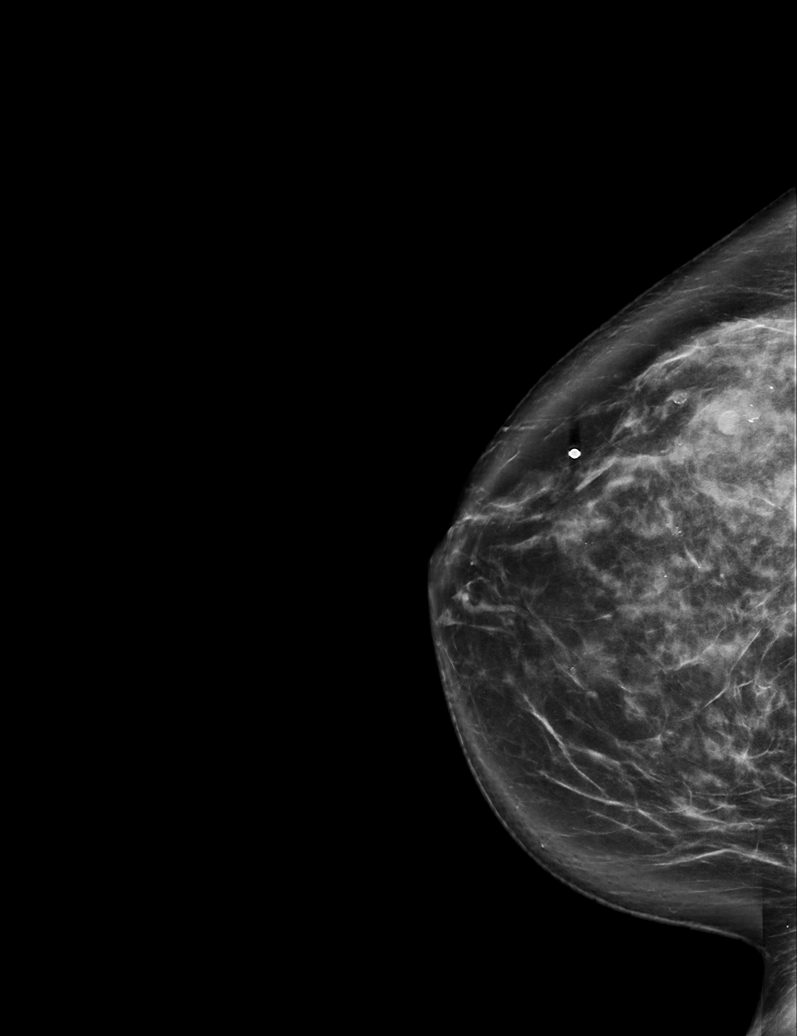

[6 of 36 positions shown; findings below may reference images not displayed]

ACR Breast Density Category c: The breast tissue is heterogeneously
dense, which may obscure small masses.
FINDINGS: No suspicious mass, malignant type microcalcifications or distortion
detected in either breast.

Mammographic images were processed with CAD.

Targeted ultrasound is performed, showing a well-circumscribed
anechoic cyst in the left breast at 1 o'clock 2 cm from the nipple
measuring 8 x 7 x 4 mm. Sonographic evaluation of the right breast
shows a well-circumscribed anechoic cyst in the right breast at 10
o'clock 1 cm from the nipple measuring 8 x 6 x 8 mm. No solid mass
or abnormal shadowing detected.
IMPRESSION: Bilateral breast cyst.  No evidence of malignancy in either breast.

RECOMMENDATION:
Bilateral screening mammogram in 1 year is recommended.

I have discussed the findings and recommendations with the patient.
Results were also provided in writing at the conclusion of the
visit. If applicable, a reminder letter will be sent to the patient
regarding the next appointment.

BI-RADS CATEGORY  2: Benign.

## 2018-04-15 IMAGING — US ULTRASOUND LEFT BREAST LIMITED
1 series · 6 of 6 positions shown · non-contrast
Comparison: Previous exam(s).

CLINICAL DATA: Patient complains of diffuse bilateral breast pain
and palpable abnormalities in both breast.

EXAM:
DIGITAL DIAGNOSTIC BILATERAL MAMMOGRAM WITH CAD AND TOMO
ULTRASOUND BILATERAL BREAST

[Series 1: ultrasound left breast limited · 0.06mm/px · 6 of 6 slices shown]
[im 1/6]
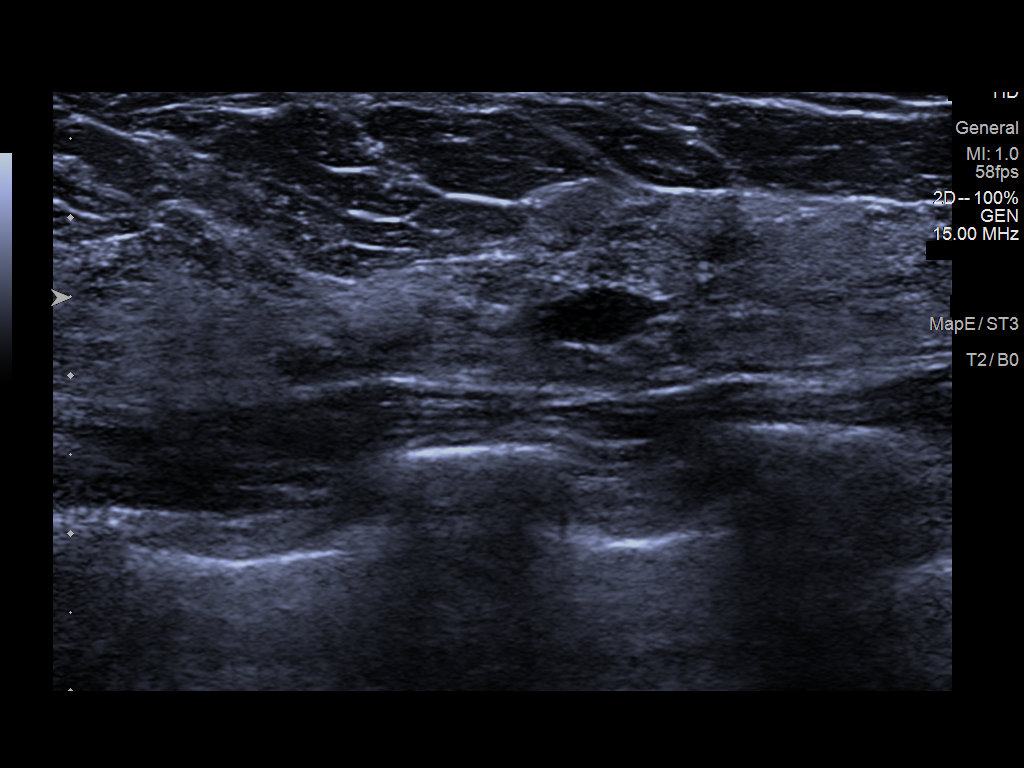
[im 2/6]
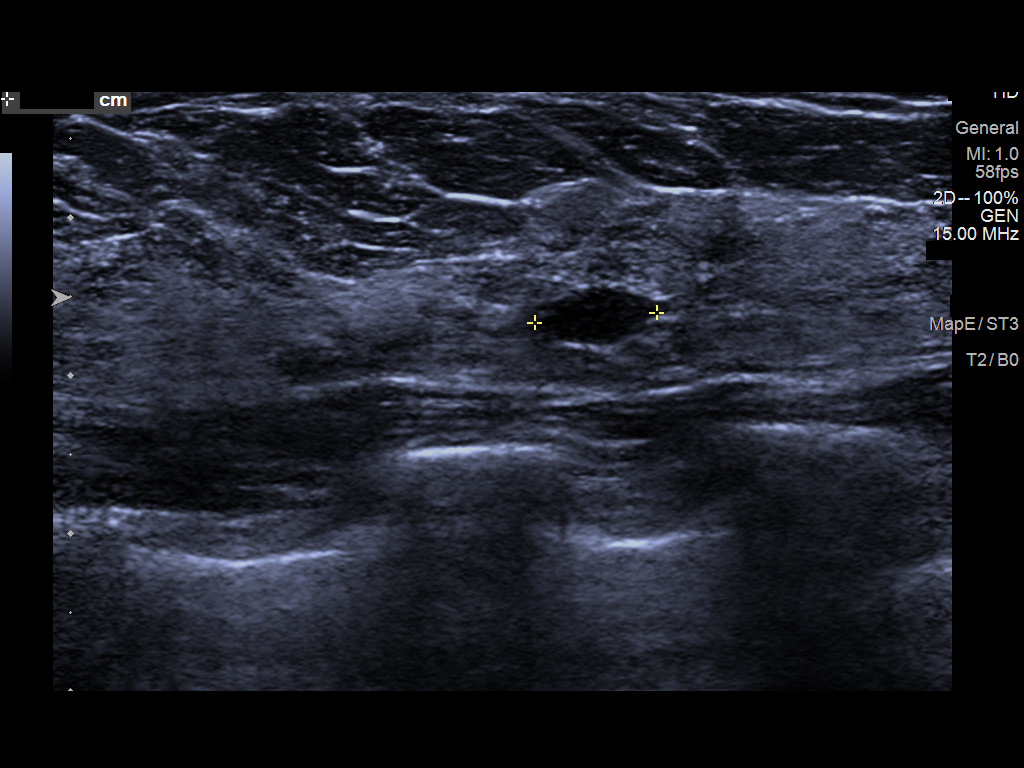
[im 3/6]
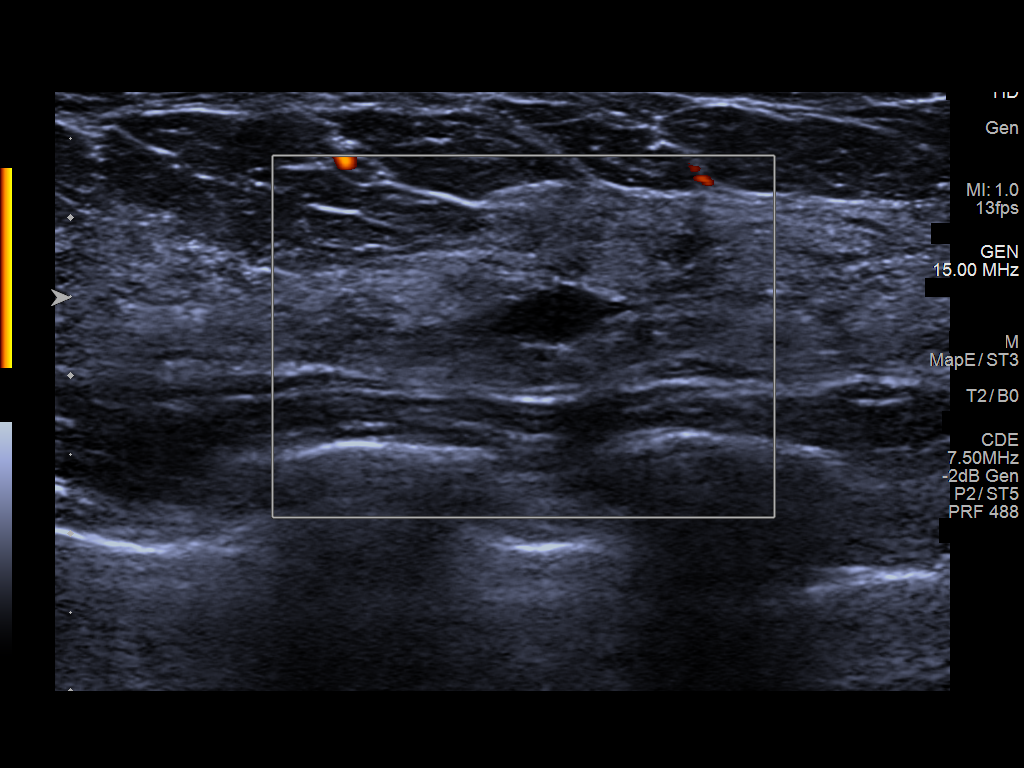
[im 4/6]
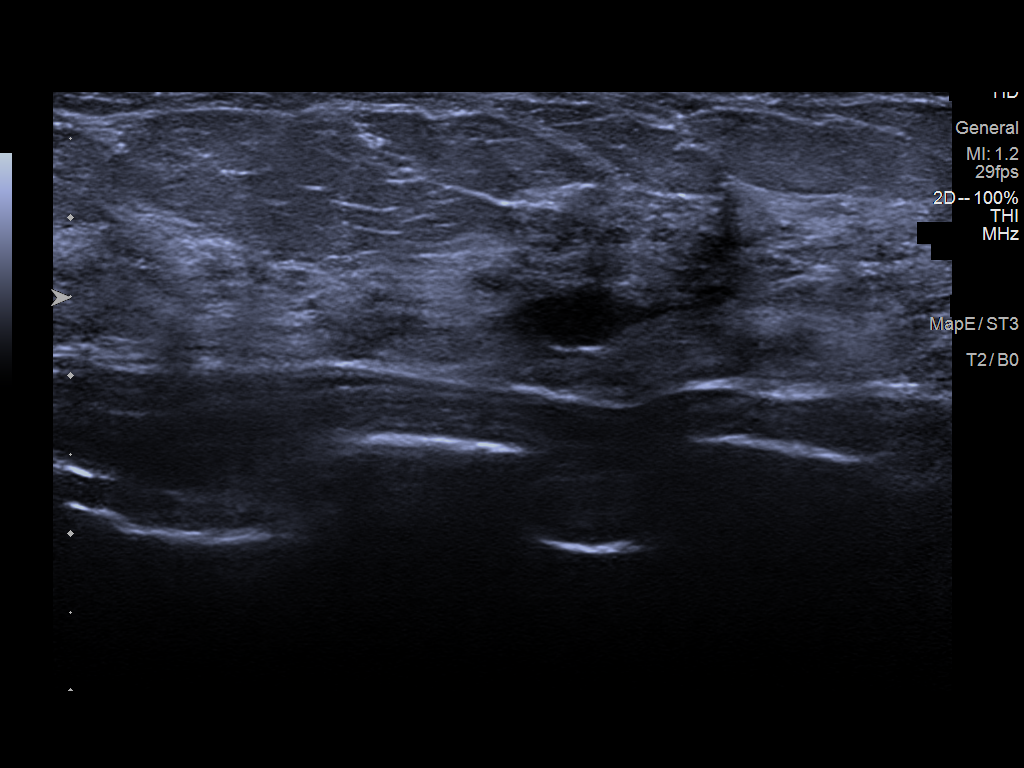
[im 5/6]
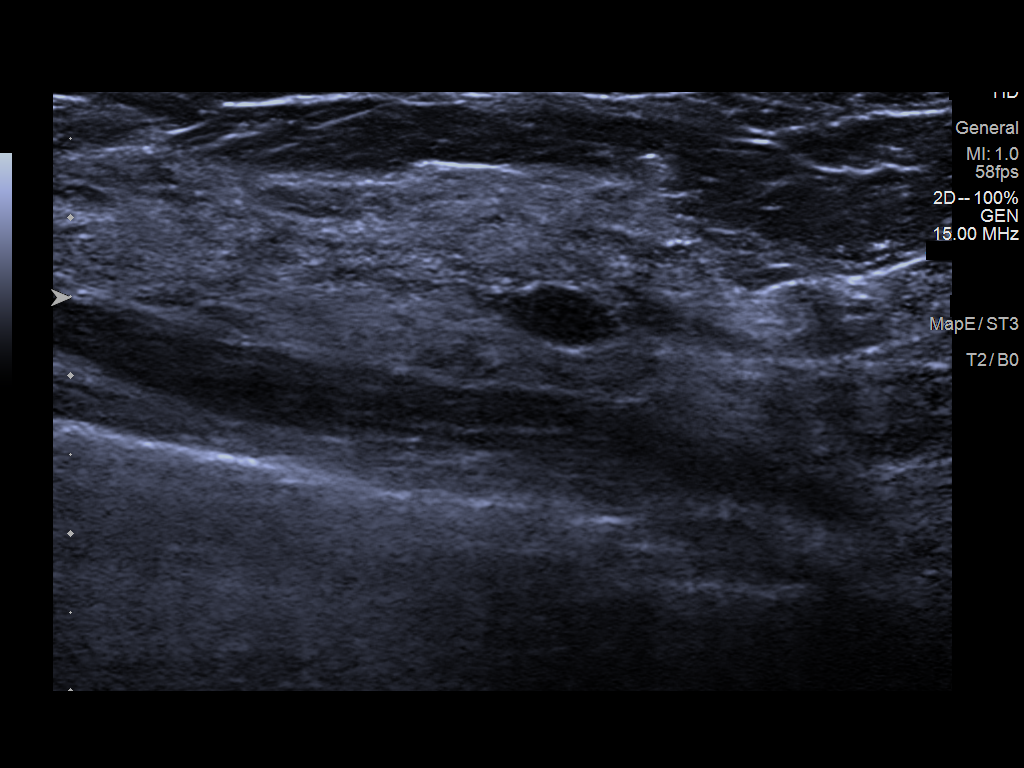
[im 6/6]
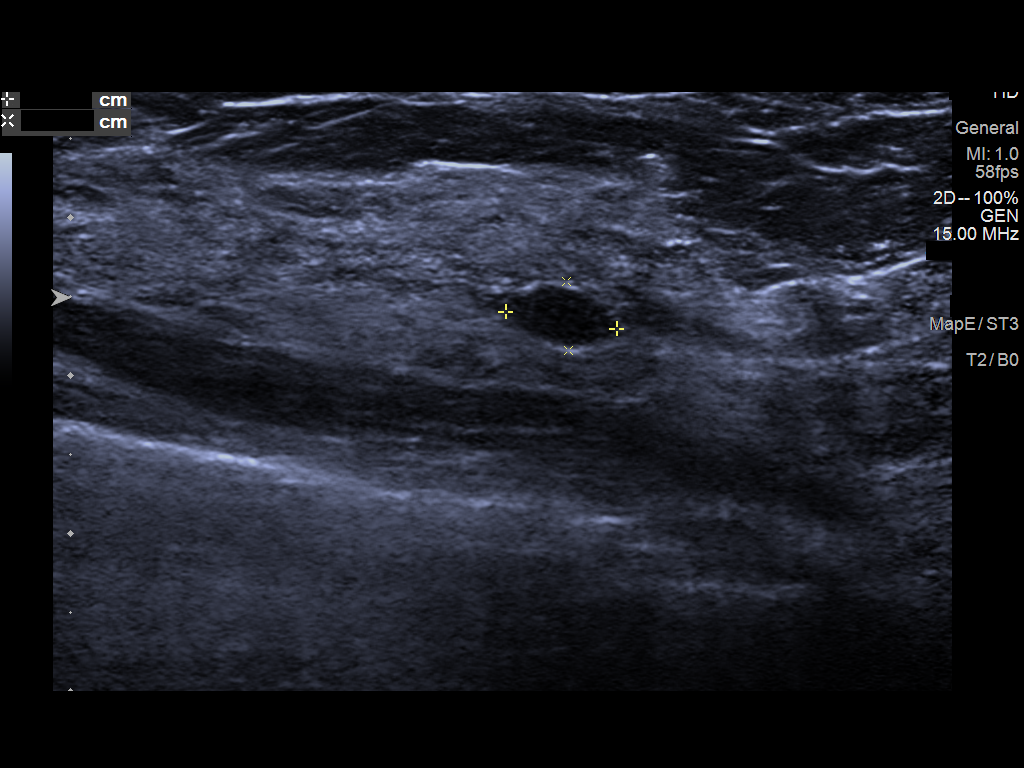

[6 of 6 positions shown; findings below may reference images not displayed]

ACR Breast Density Category c: The breast tissue is heterogeneously
dense, which may obscure small masses.
FINDINGS: No suspicious mass, malignant type microcalcifications or distortion
detected in either breast.

Mammographic images were processed with CAD.

Targeted ultrasound is performed, showing a well-circumscribed
anechoic cyst in the left breast at 1 o'clock 2 cm from the nipple
measuring 8 x 7 x 4 mm. Sonographic evaluation of the right breast
shows a well-circumscribed anechoic cyst in the right breast at 10
o'clock 1 cm from the nipple measuring 8 x 6 x 8 mm. No solid mass
or abnormal shadowing detected.
IMPRESSION: Bilateral breast cyst.  No evidence of malignancy in either breast.

RECOMMENDATION:
Bilateral screening mammogram in 1 year is recommended.

I have discussed the findings and recommendations with the patient.
Results were also provided in writing at the conclusion of the
visit. If applicable, a reminder letter will be sent to the patient
regarding the next appointment.

BI-RADS CATEGORY  2: Benign.

## 2018-05-30 ENCOUNTER — Other Ambulatory Visit: Payer: Self-pay | Admitting: Family Medicine

## 2018-05-30 MED FILL — FLUTICASONE PROP 50 MCG SPR: 50 | 30 days supply | Qty: 16 | Fill #0

## 2018-05-30 MED FILL — ESTRADIOL 0.1 MG/GM CREA: 0.1 | 90 days supply | Qty: 43 | Fill #0

## 2018-05-31 MED FILL — VENTOLIN HFA 90 MCG INHALER: 108 (90 BAS | 25 days supply | Qty: 18 | Fill #0

## 2018-08-23 ENCOUNTER — Ambulatory Visit (INDEPENDENT_AMBULATORY_CARE_PROVIDER_SITE_OTHER): Payer: 59 | Admitting: Certified Nurse Midwife

## 2018-08-23 ENCOUNTER — Other Ambulatory Visit: Payer: Self-pay

## 2018-08-23 ENCOUNTER — Other Ambulatory Visit (HOSPITAL_COMMUNITY)
Admission: RE | Admit: 2018-08-23 | Discharge: 2018-08-23 | Disposition: A | Discharge status: Home / Self Care | Payer: 59 | Source: Ambulatory Visit | Attending: Certified Nurse Midwife | Admitting: Certified Nurse Midwife

## 2018-08-23 VITALS — BP 122/74 | Ht 60.0 in | Wt 120.0 lb

## 2018-08-23 DIAGNOSIS — Z01419 Encounter for gynecological examination (general) (routine) without abnormal findings: Secondary | ICD-10-CM | POA: Diagnosis not present

## 2018-08-23 DIAGNOSIS — Z124 Encounter for screening for malignant neoplasm of cervix: Secondary | ICD-10-CM | POA: Insufficient documentation

## 2018-08-23 MED ORDER — ESTRADIOL 0.1 MG/GM VA CREA: TOPICAL_CREAM | VAGINAL | 2 refills | Status: DC
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
## 2018-08-24 ENCOUNTER — Encounter: Payer: Self-pay | Admitting: Certified Nurse Midwife

## 2018-08-24 ENCOUNTER — Telehealth: Payer: Self-pay | Admitting: *Deleted

## 2018-08-24 NOTE — Telephone Encounter (Signed)
Patient states she is having a flare up of Lyme disease and she would like doxycycline sent to pharmacy because it helps.  She is having joint pain. This needs to be called in to [place].    Nina,cma

## 2018-08-24 NOTE — Telephone Encounter (Signed)
Copied from [place] [id number]. Topic: General - Other >> Aug 24, 2018  8:49 AM Antonieta Iba C wrote: Pt says that she is having a Lime disease flare up. Pt says that this has happened 3 other times to her and PCP sends in Doxycycline and it helps. Pt says that she  is having joint pain.   Pharmacy: [place], [place] [phone] (Phone) [phone] (Fax)   Please advise.

## 2018-08-25 LAB — CYTOLOGY - PAP
Diagnosis: NEGATIVE
HPV: NOT DETECTED

## 2018-08-25 NOTE — Telephone Encounter (Signed)
Please contact the patient and see what symptoms she is having. Has she been bitten by a tick recently. Is she having any respiratory symptoms or body aches or COVID symptoms? I can not see in the chart where she has been diagnosed with lyme disease previously based on lab studies. I have also not prescribed doxycycline for her previously.

## 2018-08-29 NOTE — Telephone Encounter (Signed)
Patient states she has not recently had a bite as far as she knows[place] bites but she has not seen a tick.  Pt states her pain is not better, its in her fingers and hands, joint  pain and inflammations in the joint and hips.  Pt states she was prescribed doxicycycline before from the hospital but now since they are doing covid testing its roped off and some parts of the hospital is shut down, she states she has tried ibuprofen and it does not help and it feel the same as when she found the tick on her 1 year ago.  Please advise.  Majour Frei,cma

## 2018-08-30 NOTE — Telephone Encounter (Signed)
Patient scheduled for tomorrow morning @ 8 am with you on a virtual visit.  Nina,cma

## 2018-08-30 NOTE — Telephone Encounter (Signed)
I would suggest doing a virtual visit to discuss further and determine the appropriate work up. She will likely need labs to check for lyme disease, though without a recent tick exposure or rash I would be hesitant to treat with doxycycline for joint pain without confirmatory testing as there could be numerous other causes for her symptoms. Thanks.

## 2018-08-31 ENCOUNTER — Encounter: Payer: Self-pay | Admitting: Family Medicine

## 2018-08-31 ENCOUNTER — Other Ambulatory Visit: Payer: Self-pay

## 2018-08-31 ENCOUNTER — Ambulatory Visit (INDEPENDENT_AMBULATORY_CARE_PROVIDER_SITE_OTHER): Payer: 59 | Admitting: Family Medicine

## 2018-08-31 ENCOUNTER — Encounter: Payer: Self-pay | Admitting: Certified Nurse Midwife

## 2018-08-31 DIAGNOSIS — S0300XA Dislocation of jaw, unspecified side, initial encounter: Secondary | ICD-10-CM | POA: Diagnosis not present

## 2018-08-31 DIAGNOSIS — W57XXXA Bitten or stung by nonvenomous insect and other nonvenomous arthropods, initial encounter: Secondary | ICD-10-CM | POA: Diagnosis not present

## 2018-08-31 DIAGNOSIS — M25542 Pain in joints of left hand: Secondary | ICD-10-CM | POA: Diagnosis not present

## 2018-08-31 DIAGNOSIS — M2550 Pain in unspecified joint: Secondary | ICD-10-CM | POA: Insufficient documentation

## 2018-08-31 DIAGNOSIS — M25541 Pain in joints of right hand: Secondary | ICD-10-CM

## 2018-08-31 DIAGNOSIS — S30861A Insect bite (nonvenomous) of abdominal wall, initial encounter: Secondary | ICD-10-CM | POA: Diagnosis not present

## 2018-08-31 MED ORDER — DOXYCYCLINE HYCLATE 100 MG PO TABS: 100.0000 mg | ORAL_TABLET | Freq: Two times a day (BID) | ORAL | 0 refills | Status: DC

## 2018-08-31 NOTE — Assessment & Plan Note (Addendum)
Concerning for Lyme disease given prior issues with similar symptoms after tick bite.  She potentially could have had 2 tick bites based on her description.  She notes no other significant symptoms.  Discussed empiric treatment with doxycycline as well as Lyme antibody testing.  Advised that she would need to go to the lab at the hospital to have this completed.  I also discussed that she needed to contact health at work and let them know about her joint aches in case they felt as though she needed to be tested for COVID-19 though I do not suspect COVID-19 in this case.  If not improving she will let us know.  If worsening she will contact us immediately.
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
## 2018-08-31 NOTE — Assessment & Plan Note (Signed)
Patient with a long history of this.  Bothering her more recently given that she has been wearing a mask and having her mouth open for prolonged periods of time.  Discussed continuing ibuprofen and seeing if the doxycycline takes care of a potential Lyme disease related cause.  If not improving she will need to see her dentist.

## 2018-08-31 NOTE — Assessment & Plan Note (Signed)
Possible tick bite.  Treating with doxycycline for potential Lyme disease.

## 2018-09-02 ENCOUNTER — Telehealth: Payer: Self-pay

## 2018-09-02 DIAGNOSIS — M2550 Pain in unspecified joint: Secondary | ICD-10-CM

## 2018-09-02 NOTE — Telephone Encounter (Signed)
Copied from [place] [id number]. Topic: General - Other >> Sep 02, 2018  1:54 PM Burchel, Abbi R wrote: Reason for CRM: Pt states she cannot go back to work until cleared by Dr Caryl Bis even though she has been cleared by [facility] and has neg COVID-19 test.  Pt also states she was instructed to be tested for lyme disease by Dr Caryl Bis, but needs to know where to get that testing done.  Please call pt to advise:  [phone]

## 2018-09-05 NOTE — Telephone Encounter (Signed)
Noted.  The patient should go to the medical mall at the hospital to have the Lyme testing completed.  From my perspective I think she is fine to be working as long as his health at work has cleared her and she has had a negative COVID-19 test.  Please see if we can get a copy of her COVID-19 test.  Once we get this result and can confirm the negative test I can clear her to go back to work.  Thanks.

## 2018-09-05 NOTE — Telephone Encounter (Signed)
Pt states she cannot go back to work until cleared by Dr Caryl Bis even though she has been cleared by [employer] and has neg COVID-19 test.  Pt also states she was instructed to be tested for lyme disease by Dr Caryl Bis, but needs to know where to get that testing done.  nina,cma

## 2018-09-06 ENCOUNTER — Other Ambulatory Visit
Admission: RE | Admit: 2018-09-06 | Discharge: 2018-09-06 | Disposition: A | Discharge status: Home / Self Care | Payer: 59 | Attending: Family Medicine | Admitting: Family Medicine

## 2018-09-06 ENCOUNTER — Other Ambulatory Visit: Payer: Self-pay

## 2018-09-06 DIAGNOSIS — S30861A Insect bite (nonvenomous) of abdominal wall, initial encounter: Secondary | ICD-10-CM | POA: Insufficient documentation

## 2018-09-06 DIAGNOSIS — W57XXXA Bitten or stung by nonvenomous insect and other nonvenomous arthropods, initial encounter: Secondary | ICD-10-CM | POA: Insufficient documentation

## 2018-09-07 LAB — B. BURGDORFI ANTIBODIES: B burgdorferi Ab IgG+IgM: <0.91 {ISR} (ref 0.00–0.90)

## 2018-09-07 NOTE — Telephone Encounter (Signed)
Pt stated health at work cleared her and she is back to work and she has already done the lyme test and has started the medication doxycycline and she is 90% bette.  Nina,cma

## 2018-09-07 NOTE — Telephone Encounter (Signed)
Noted. Her lyme test was negative. Given the recurrence of these issues it may be a good idea for her to see rheumatology to see if they can help identify a cause. Thanks.

## 2018-09-09 NOTE — Telephone Encounter (Signed)
Called Pt back with results and she stated she understood, but she will also like to wait before seeing Rheumatology.

## 2018-09-09 NOTE — Telephone Encounter (Signed)
Noted  

## 2018-09-12 NOTE — Telephone Encounter (Signed)
Patient is calling back that she would like to proceed with the referral to see Rheumatology.  Please advise [phone]

## 2018-09-13 NOTE — Telephone Encounter (Signed)
Referral placed.

## 2018-09-13 NOTE — Addendum Note (Signed)
Addended by: Leone Haven on: 09/13/2018 01:42 PM   Modules accepted: Orders

## 2018-10-03 DIAGNOSIS — Z13820 Encounter for screening for osteoporosis: Secondary | ICD-10-CM | POA: Diagnosis not present

## 2018-10-03 DIAGNOSIS — M19041 Primary osteoarthritis, right hand: Secondary | ICD-10-CM | POA: Diagnosis not present

## 2018-10-03 DIAGNOSIS — M7061 Trochanteric bursitis, right hip: Secondary | ICD-10-CM | POA: Insufficient documentation

## 2018-10-03 DIAGNOSIS — M19042 Primary osteoarthritis, left hand: Secondary | ICD-10-CM | POA: Diagnosis not present

## 2018-10-03 DIAGNOSIS — M2550 Pain in unspecified joint: Secondary | ICD-10-CM | POA: Diagnosis not present

## 2018-10-12 ENCOUNTER — Encounter: Payer: Self-pay | Admitting: Family Medicine

## 2018-10-12 DIAGNOSIS — Z7184 Encounter for health counseling related to travel: Secondary | ICD-10-CM

## 2018-10-13 ENCOUNTER — Encounter: Payer: Self-pay | Admitting: Family Medicine

## 2018-10-13 NOTE — Telephone Encounter (Signed)
Order has been placed. Please pull the order form off the computer for this patient. Thanks.

## 2018-10-17 NOTE — Telephone Encounter (Signed)
Patient requesting SARS-cov-2 nasopharyngeal swab and fax the order to pre-admit testing [phone], please contact patient best # [phone]

## 2018-10-18 ENCOUNTER — Encounter: Payer: Self-pay | Admitting: Family Medicine

## 2018-10-20 ENCOUNTER — Encounter
Admission: RE | Admit: 2018-10-20 | Discharge: 2018-10-20 | Disposition: A | Discharge status: Home / Self Care | Payer: 59 | Source: Ambulatory Visit | Attending: Family Medicine | Admitting: Family Medicine

## 2018-10-20 ENCOUNTER — Other Ambulatory Visit: Payer: Self-pay

## 2018-10-20 DIAGNOSIS — Z01812 Encounter for preprocedural laboratory examination: Secondary | ICD-10-CM | POA: Diagnosis not present

## 2018-10-20 DIAGNOSIS — M159 Polyosteoarthritis, unspecified: Secondary | ICD-10-CM | POA: Diagnosis not present

## 2018-10-20 DIAGNOSIS — Z20828 Contact with and (suspected) exposure to other viral communicable diseases: Secondary | ICD-10-CM | POA: Insufficient documentation

## 2018-10-20 LAB — SARS CORONAVIRUS 2 BY RT PCR (HOSPITAL ORDER, PERFORMED IN ~~LOC~~ HOSPITAL LAB): SARS Coronavirus 2: NEGATIVE

## 2018-12-20 ENCOUNTER — Other Ambulatory Visit: Payer: Self-pay | Admitting: *Deleted

## 2018-12-20 DIAGNOSIS — Z20822 Contact with and (suspected) exposure to covid-19: Secondary | ICD-10-CM

## 2019-01-10 DIAGNOSIS — D2271 Melanocytic nevi of right lower limb, including hip: Secondary | ICD-10-CM | POA: Diagnosis not present

## 2019-01-10 DIAGNOSIS — D2262 Melanocytic nevi of left upper limb, including shoulder: Secondary | ICD-10-CM | POA: Diagnosis not present

## 2019-01-10 DIAGNOSIS — D1800 Hemangioma unspecified site: Secondary | ICD-10-CM | POA: Diagnosis not present

## 2019-01-10 DIAGNOSIS — D2371 Other benign neoplasm of skin of right lower limb, including hip: Secondary | ICD-10-CM | POA: Diagnosis not present

## 2019-01-10 DIAGNOSIS — L570 Actinic keratosis: Secondary | ICD-10-CM | POA: Diagnosis not present

## 2019-01-10 DIAGNOSIS — D2230 Melanocytic nevi of unspecified part of face: Secondary | ICD-10-CM | POA: Diagnosis not present

## 2019-01-10 DIAGNOSIS — Z86018 Personal history of other benign neoplasm: Secondary | ICD-10-CM | POA: Diagnosis not present

## 2019-01-10 DIAGNOSIS — Z1283 Encounter for screening for malignant neoplasm of skin: Secondary | ICD-10-CM | POA: Diagnosis not present

## 2019-01-10 DIAGNOSIS — D2261 Melanocytic nevi of right upper limb, including shoulder: Secondary | ICD-10-CM | POA: Diagnosis not present

## 2019-03-22 DIAGNOSIS — H5202 Hypermetropia, left eye: Secondary | ICD-10-CM | POA: Diagnosis not present

## 2019-03-22 DIAGNOSIS — H52221 Regular astigmatism, right eye: Secondary | ICD-10-CM | POA: Diagnosis not present

## 2019-03-22 DIAGNOSIS — H5211 Myopia, right eye: Secondary | ICD-10-CM | POA: Diagnosis not present

## 2019-04-14 ENCOUNTER — Other Ambulatory Visit: Payer: Self-pay

## 2019-04-14 ENCOUNTER — Encounter: Payer: Self-pay | Admitting: Family Medicine

## 2019-04-14 ENCOUNTER — Ambulatory Visit (INDEPENDENT_AMBULATORY_CARE_PROVIDER_SITE_OTHER): Payer: 59 | Admitting: Family Medicine

## 2019-04-14 VITALS — Ht 60.0 in | Wt 120.0 lb

## 2019-04-14 DIAGNOSIS — Z0000 Encounter for general adult medical examination without abnormal findings: Secondary | ICD-10-CM

## 2019-04-14 DIAGNOSIS — J3089 Other allergic rhinitis: Secondary | ICD-10-CM | POA: Diagnosis not present

## 2019-04-14 DIAGNOSIS — Z1231 Encounter for screening mammogram for malignant neoplasm of breast: Secondary | ICD-10-CM

## 2019-04-14 DIAGNOSIS — Z13220 Encounter for screening for lipoid disorders: Secondary | ICD-10-CM

## 2019-04-14 MED ORDER — FLUTICASONE PROPIONATE 50 MCG/ACT NA SUSP: 2.0000 | NASAL | 6 refills | Status: DC | PRN

## 2019-04-14 MED ORDER — ALBUTEROL SULFATE HFA 108 (90 BASE) MCG/ACT IN AERS: INHALATION_SPRAY | RESPIRATORY_TRACT | 0 refills | Status: DC
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
## 2019-04-14 NOTE — Assessment & Plan Note (Signed)
Physical exam completed.  Encouraged to get back to exercising and monitoring her diet.  She will get [place] a copy of her colonoscopy report.  She is due for mammogram and this will be ordered.  She will call to schedule.  She will try to figure out when her last tetanus vaccine was with employee health.  She will let us know when she is ready for the shingles vaccine.  Lab work as outlined below.

## 2019-04-18 ENCOUNTER — Other Ambulatory Visit
Admission: RE | Admit: 2019-04-18 | Discharge: 2019-04-18 | Disposition: A | Discharge status: Home / Self Care | Payer: 59 | Source: Ambulatory Visit | Attending: Family Medicine | Admitting: Family Medicine

## 2019-04-18 DIAGNOSIS — Z13220 Encounter for screening for lipoid disorders: Secondary | ICD-10-CM | POA: Diagnosis not present

## 2019-04-18 LAB — LIPID PANEL
Cholesterol: 200 mg/dL (ref 0–200)
HDL: 65 mg/dL (ref >40)
LDL Cholesterol: 118 mg/dL — ABNORMAL HIGH (ref 0–99)
Total CHOL/HDL Ratio: 3.1 RATIO
Triglycerides: 84 mg/dL (ref <150)
VLDL: 17 mg/dL (ref 0–40)

## 2019-04-18 LAB — COMPREHENSIVE METABOLIC PANEL
ALT: 22 U/L (ref 0–44)
AST: 21 U/L (ref 15–41)
Albumin: 4.4 g/dL (ref 3.5–5.0)
Alkaline Phosphatase: 66 U/L (ref 38–126)
Anion gap: 9 (ref 5–15)
BUN: 15 mg/dL (ref 6–20)
CO2: 30 mmol/L (ref 22–32)
Calcium: 9.5 mg/dL (ref 8.9–10.3)
Chloride: 100 mmol/L (ref 98–111)
Creatinine, Ser: 0.88 mg/dL (ref 0.44–1.00)
GFR calc Af Amer: >60 mL/min (ref >60)
GFR calc non Af Amer: >60 mL/min (ref >60)
Glucose, Bld: 87 mg/dL (ref 70–99)
Potassium: 4.2 mmol/L (ref 3.5–5.1)
Sodium: 139 mmol/L (ref 135–145)
Total Bilirubin: 0.6 mg/dL (ref 0.3–1.2)
Total Protein: 7 g/dL (ref 6.5–8.1)

## 2019-05-12 ENCOUNTER — Ambulatory Visit
Admission: RE | Admit: 2019-05-12 | Discharge: 2019-05-12 | Disposition: A | Discharge status: Home / Self Care | Payer: 59 | Source: Ambulatory Visit | Attending: Family Medicine | Admitting: Family Medicine

## 2019-05-12 DIAGNOSIS — Z1231 Encounter for screening mammogram for malignant neoplasm of breast: Secondary | ICD-10-CM | POA: Insufficient documentation

## 2019-05-12 IMAGING — MG DIGITAL SCREENING BILAT W/ TOMO W/ CAD
8 series · 9 of 24 positions shown · non-contrast
Comparison: Previous exam(s).

CLINICAL DATA: Screening.

EXAM:
DIGITAL SCREENING BILATERAL MAMMOGRAM WITH TOMO AND CAD

[R CC synth-2D]
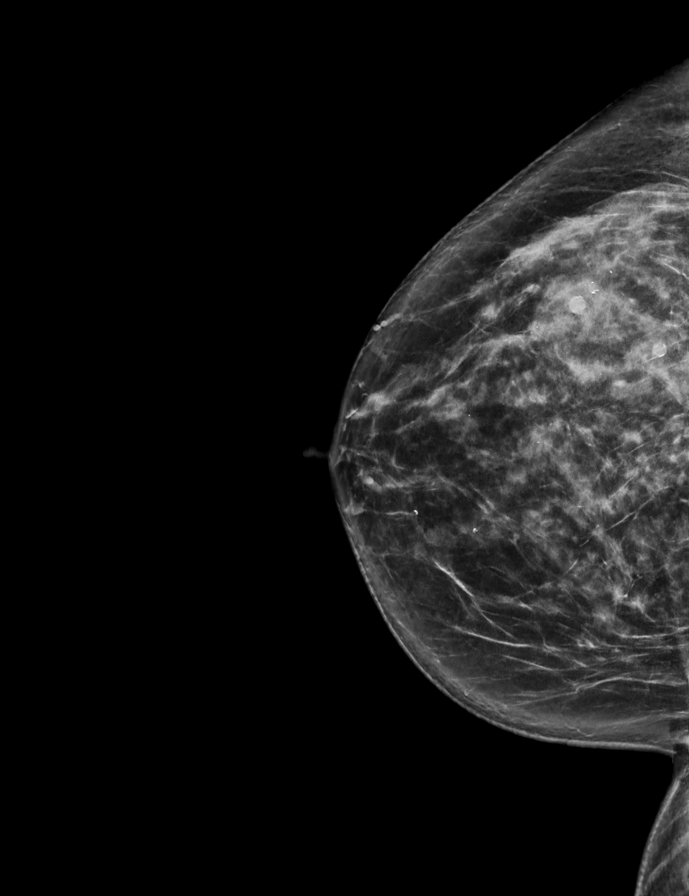

[L CC synth-2D]
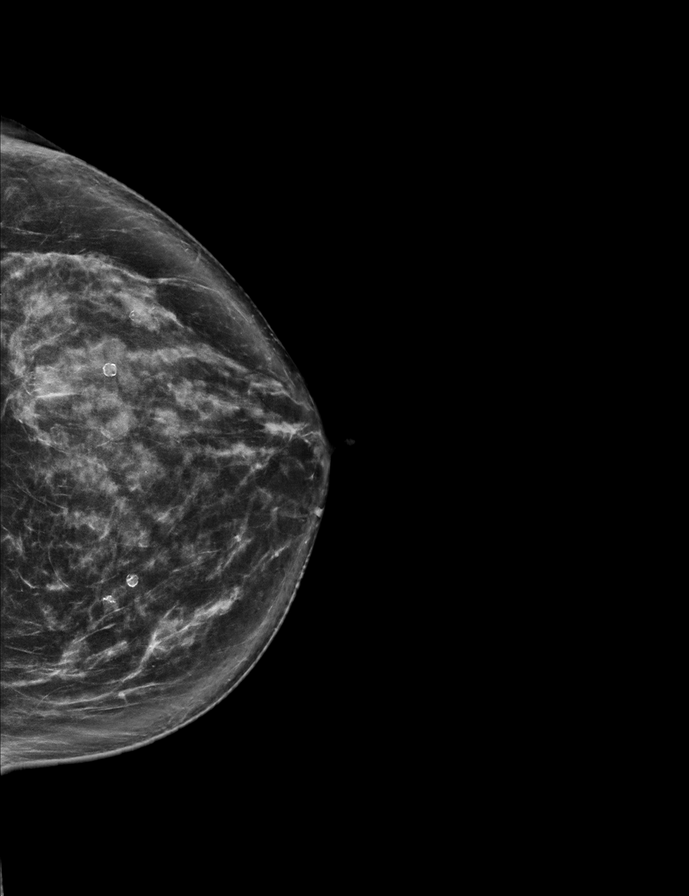

[R MLO synth-2D]
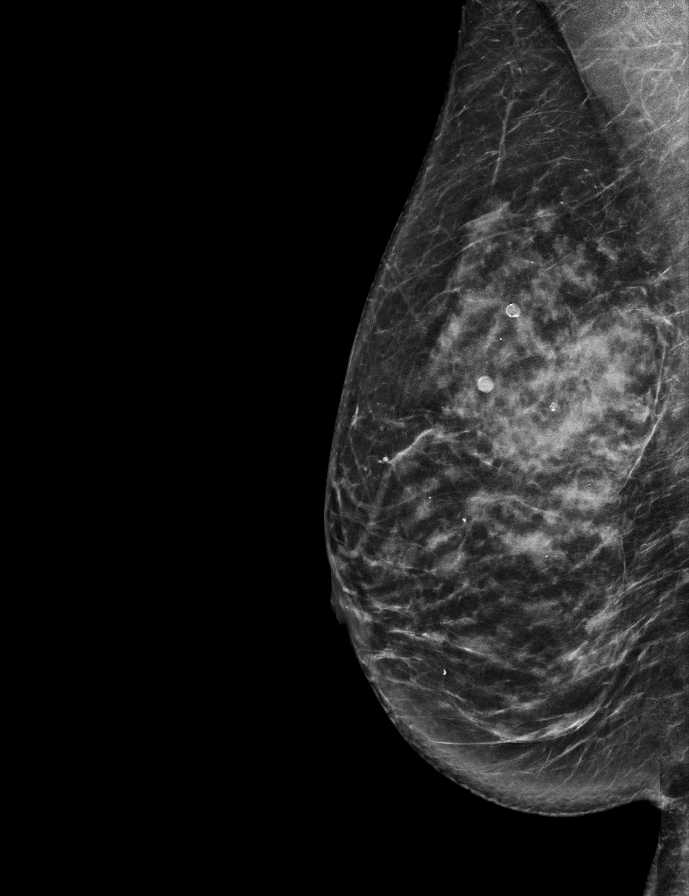

[L MLO synth-2D]
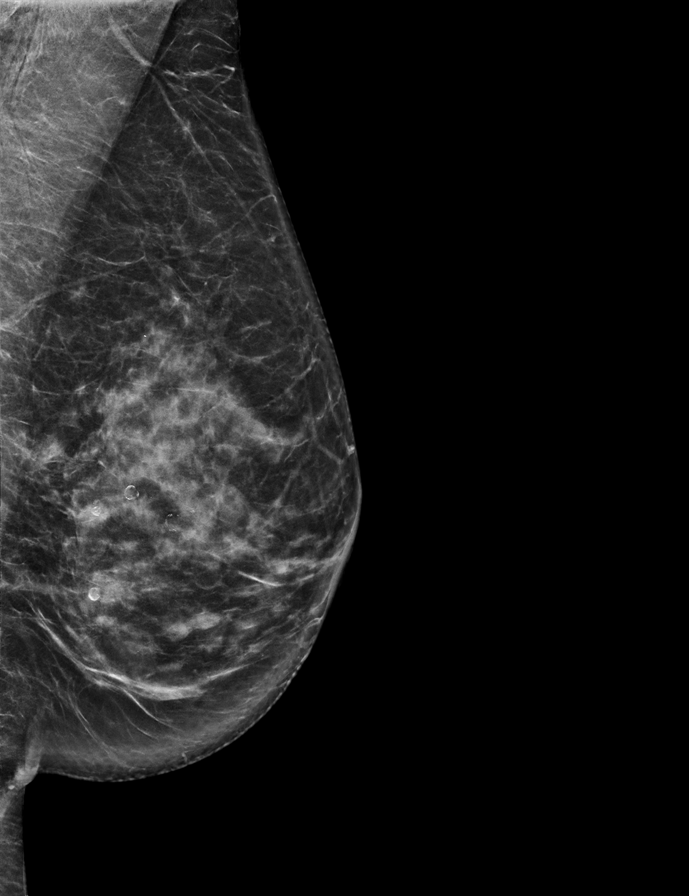

[L CC tomo · 2 of 74 frames shown]
[frame 24/74]
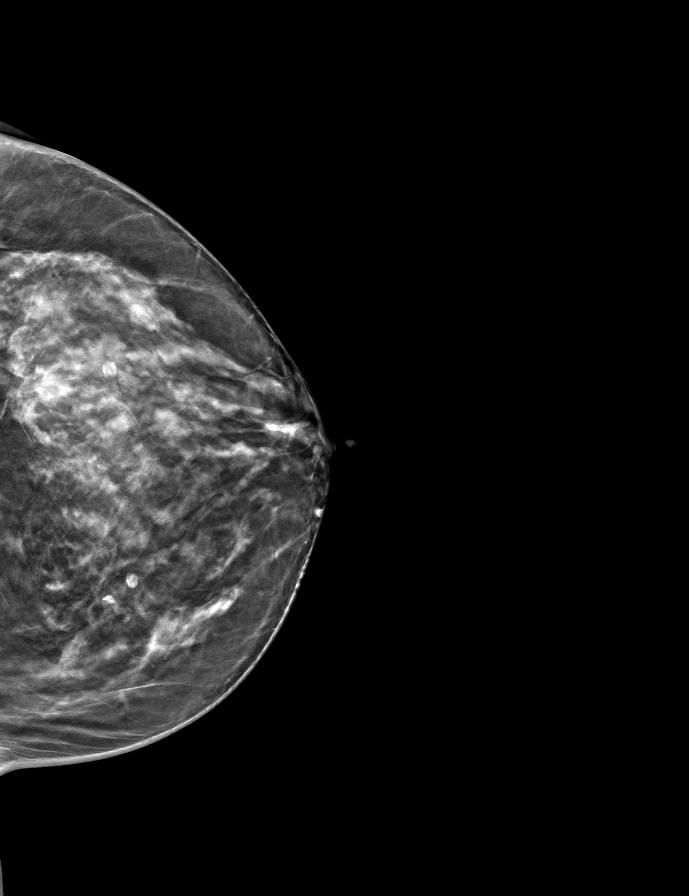
[frame 37/74]
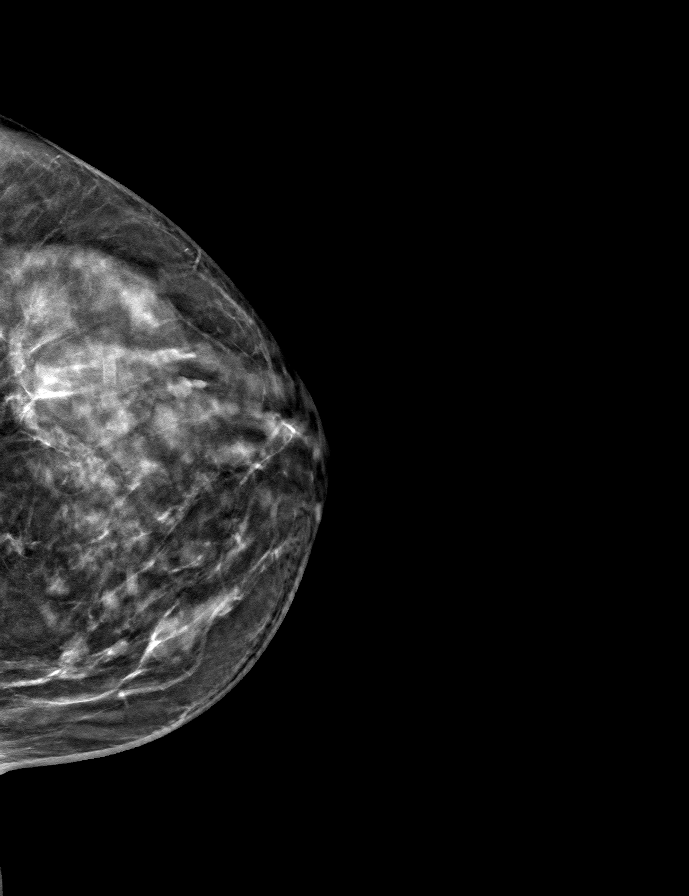

[L MLO tomo · tomo slice 35/70.0]
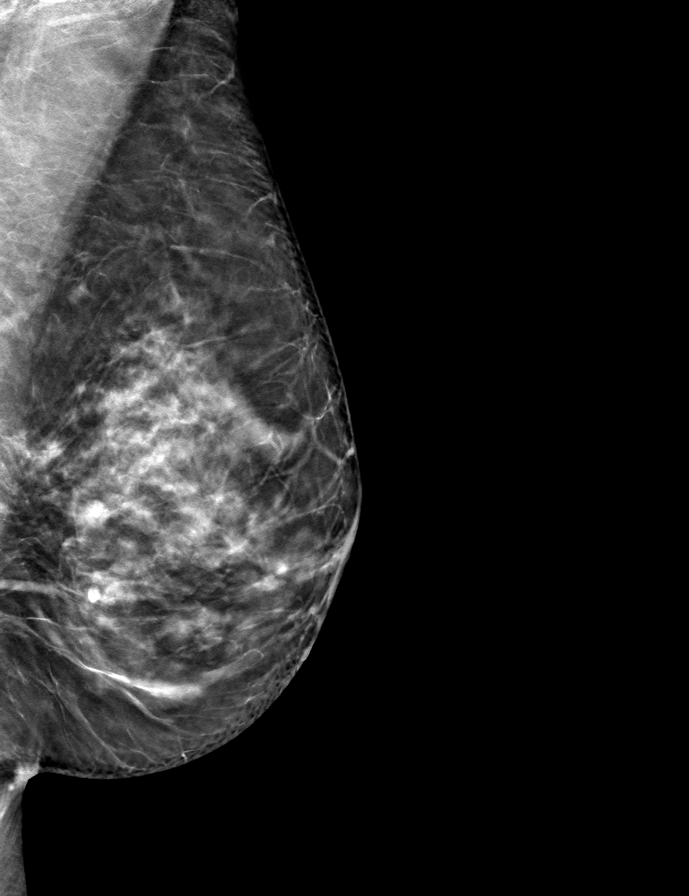

[R MLO tomo · tomo slice 35/70.0]
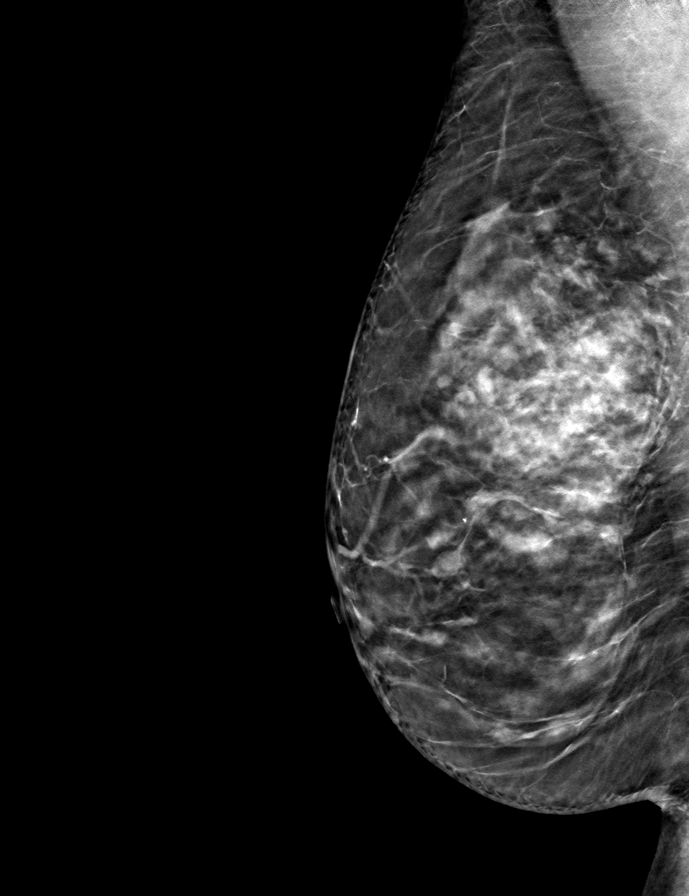

[R CC tomo · tomo slice 35/70.0]
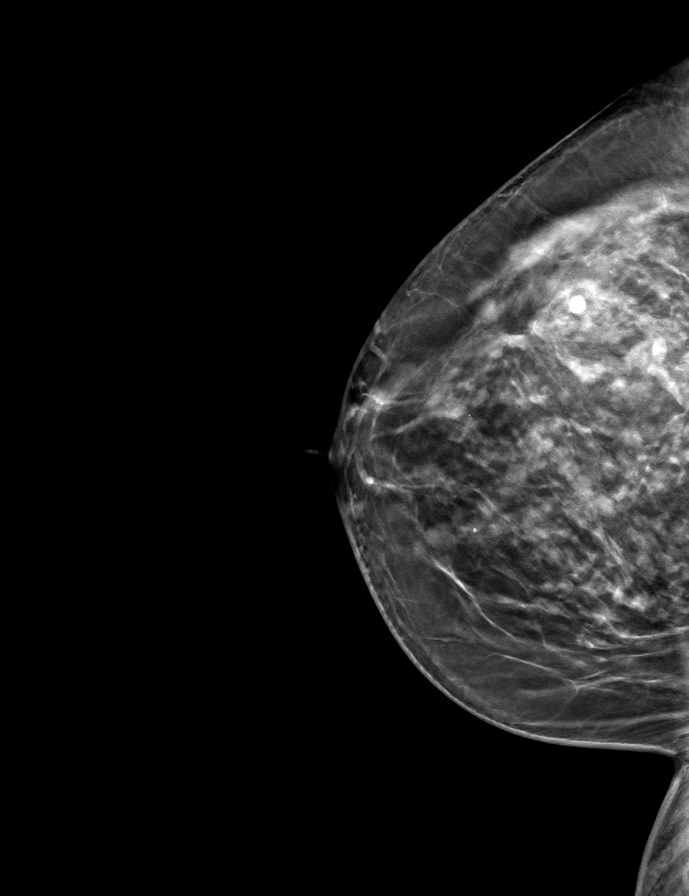

[9 of 24 positions shown; findings below may reference images not displayed]

ACR Breast Density Category c: The breast tissue is heterogeneously
dense, which may obscure small masses.
FINDINGS: There are no findings suspicious for malignancy. Images were
processed with CAD.
IMPRESSION: No mammographic evidence of malignancy. A result letter of this
screening mammogram will be mailed directly to the patient.

RECOMMENDATION:
Screening mammogram in one year. (Code:[5V])

BI-RADS CATEGORY  1: Negative.

## 2019-07-11 ENCOUNTER — Other Ambulatory Visit: Payer: Self-pay

## 2019-07-11 ENCOUNTER — Ambulatory Visit: Payer: 59 | Admitting: Dermatology

## 2019-07-11 DIAGNOSIS — L719 Rosacea, unspecified: Secondary | ICD-10-CM

## 2019-07-11 DIAGNOSIS — L578 Other skin changes due to chronic exposure to nonionizing radiation: Secondary | ICD-10-CM | POA: Diagnosis not present

## 2019-07-11 DIAGNOSIS — Z872 Personal history of diseases of the skin and subcutaneous tissue: Secondary | ICD-10-CM

## 2019-07-11 NOTE — Progress Notes (Signed)
   Follow-Up Visit   Subjective  Amanda Skinner is a 59 y.o. female who presents for the following: Actinic Keratosis (nasal dorsum). Area is not scaly, but is still pink. She wears an N95 mask at work which makes her nose red and irritated.   The following portions of the chart were reviewed this encounter and updated as appropriate:      Review of Systems:  No other skin or systemic complaints except as noted in HPI or Assessment and Plan.  Objective  Well appearing patient in no apparent distress; mood and affect are within normal limits.  A focused examination was performed including face. Relevant physical exam findings are noted in the Assessment and Plan.  Objective  Nasal dorsum: Clear  Objective  Nose: Erythema    Assessment & Plan  History of actinic keratosis Nasal dorsum  Clear. Observe for recurrence. Call clinic for new or changing lesions.  Recommend regular skin exams, daily broad-spectrum spf 30+ sunscreen use, and photoprotection.     Rosacea Nose  Vs Irritation from n95 mask  Discussed laser treatment if doesn't resolve after not wearing mask. Continue sunscreen daily  Actinic Damage - diffuse scaly erythematous macules with underlying dyspigmentation - Recommend daily broad spectrum sunscreen SPF 30+ to sun-exposed areas, reapply every 2 hours as needed.  - Call for new or changing lesions.   Return in about 6 months (around 01/11/2020) for TBSE.  IJamesetta Orleans, CMA, am acting as scribe for Brendolyn Patty, MD .  Documentation: I have reviewed the above documentation for accuracy and completeness, and I agree with the above.  Brendolyn Patty MD
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
## 2019-08-24 ENCOUNTER — Other Ambulatory Visit (HOSPITAL_COMMUNITY)
Admission: RE | Admit: 2019-08-24 | Discharge: 2019-08-24 | Disposition: A | Discharge status: Home / Self Care | Payer: 59 | Source: Ambulatory Visit | Attending: Certified Nurse Midwife | Admitting: Certified Nurse Midwife

## 2019-08-24 ENCOUNTER — Other Ambulatory Visit: Payer: Self-pay

## 2019-08-24 ENCOUNTER — Encounter: Payer: Self-pay | Admitting: Certified Nurse Midwife

## 2019-08-24 ENCOUNTER — Ambulatory Visit (INDEPENDENT_AMBULATORY_CARE_PROVIDER_SITE_OTHER): Payer: 59 | Admitting: Certified Nurse Midwife

## 2019-08-24 ENCOUNTER — Other Ambulatory Visit: Payer: Self-pay | Admitting: Certified Nurse Midwife

## 2019-08-24 VITALS — BP 120/70 | HR 74 | Resp 16 | Ht 59.0 in | Wt 126.6 lb

## 2019-08-24 DIAGNOSIS — Z01419 Encounter for gynecological examination (general) (routine) without abnormal findings: Secondary | ICD-10-CM | POA: Diagnosis not present

## 2019-08-24 DIAGNOSIS — Z124 Encounter for screening for malignant neoplasm of cervix: Secondary | ICD-10-CM

## 2019-08-24 DIAGNOSIS — N952 Postmenopausal atrophic vaginitis: Secondary | ICD-10-CM

## 2019-08-24 DIAGNOSIS — Z0000 Encounter for general adult medical examination without abnormal findings: Secondary | ICD-10-CM | POA: Diagnosis not present

## 2019-08-24 MED ORDER — ESTRADIOL 0.1 MG/GM VA CREA: TOPICAL_CREAM | VAGINAL | 2 refills | Status: DC

## 2019-08-30 LAB — CYTOLOGY - PAP
Comment: NEGATIVE
Diagnosis: NEGATIVE
High risk HPV: NEGATIVE

## 2019-09-07 ENCOUNTER — Encounter: Payer: Self-pay | Admitting: Certified Nurse Midwife

## 2019-10-10 ENCOUNTER — Ambulatory Visit
Admission: RE | Admit: 2019-10-10 | Discharge: 2019-10-10 | Disposition: A | Discharge status: Home / Self Care | Payer: 59 | Source: Ambulatory Visit | Attending: Nurse Practitioner | Admitting: Nurse Practitioner

## 2019-10-10 ENCOUNTER — Ambulatory Visit: Payer: 59 | Admitting: Nurse Practitioner

## 2019-10-10 ENCOUNTER — Other Ambulatory Visit: Payer: Self-pay

## 2019-10-10 ENCOUNTER — Encounter: Payer: Self-pay | Admitting: Nurse Practitioner

## 2019-10-10 VITALS — BP 112/84 | HR 73 | Temp 97.6°F | Ht 59.0 in | Wt 127.0 lb

## 2019-10-10 DIAGNOSIS — R109 Unspecified abdominal pain: Secondary | ICD-10-CM | POA: Insufficient documentation

## 2019-10-10 DIAGNOSIS — R300 Dysuria: Secondary | ICD-10-CM | POA: Insufficient documentation

## 2019-10-10 DIAGNOSIS — I7090 Unspecified atherosclerosis: Secondary | ICD-10-CM | POA: Diagnosis not present

## 2019-10-10 DIAGNOSIS — N952 Postmenopausal atrophic vaginitis: Secondary | ICD-10-CM

## 2019-10-10 LAB — POCT URINALYSIS DIPSTICK
Bilirubin, UA: NEGATIVE
Blood, UA: NEGATIVE
Glucose, UA: NEGATIVE
Nitrite, UA: NEGATIVE
Protein, UA: POSITIVE — AB
Spec Grav, UA: 1.01 (ref 1.010–1.025)
Urobilinogen, UA: NEGATIVE E.U./dL — AB
pH, UA: 8.5 — AB (ref 5.0–8.0)

## 2019-10-10 LAB — CBC WITH DIFFERENTIAL/PLATELET
Basophils Absolute: 0 10*3/uL (ref 0.0–0.1)
Basophils Relative: 0.6 % (ref 0.0–3.0)
Eosinophils Absolute: 0.2 10*3/uL (ref 0.0–0.7)
Eosinophils Relative: 2 % (ref 0.0–5.0)
HCT: 40.4 % (ref 36.0–46.0)
Hemoglobin: 13.8 g/dL (ref 12.0–15.0)
Lymphocytes Relative: 26.2 % (ref 12.0–46.0)
Lymphs Abs: 2 10*3/uL (ref 0.7–4.0)
MCHC: 34.3 g/dL (ref 30.0–36.0)
MCV: 91.8 fl (ref 78.0–100.0)
Monocytes Absolute: 0.5 10*3/uL (ref 0.1–1.0)
Monocytes Relative: 6.3 % (ref 3.0–12.0)
Neutro Abs: 5 10*3/uL (ref 1.4–7.7)
Neutrophils Relative %: 64.9 % (ref 43.0–77.0)
Platelets: 218 10*3/uL (ref 150.0–400.0)
RBC: 4.4 Mil/uL (ref 3.87–5.11)
RDW: 12.9 % (ref 11.5–15.5)
WBC: 7.6 10*3/uL (ref 4.0–10.5)

## 2019-10-10 LAB — BASIC METABOLIC PANEL
BUN: 10 mg/dL (ref 6–23)
CO2: 31 mEq/L (ref 19–32)
Calcium: 9.6 mg/dL (ref 8.4–10.5)
Chloride: 100 mEq/L (ref 96–112)
Creatinine, Ser: 0.9 mg/dL (ref 0.40–1.20)
GFR: 63.85 mL/min (ref >60.00)
Glucose, Bld: 92 mg/dL (ref 70–99)
Potassium: 4.3 mEq/L (ref 3.5–5.1)
Sodium: 138 mEq/L (ref 135–145)

## 2019-10-10 LAB — URINALYSIS, MICROSCOPIC ONLY

## 2019-10-10 IMAGING — CT CT ABD-PELV W/O CM
2 of 4 series · 16 of 46 positions shown, 18 images · non-contrast
Comparison: CT the abdomen and pelvis [DATE].

CLINICAL DATA: 59-year-old female with history of severe bilateral
flank pain. Bladder spasms for the past 4 days.

EXAM:
CT ABDOMEN AND PELVIS WITHOUT CONTRAST
TECHNIQUE: Multidetector CT imaging of the abdomen and pelvis was performed
following the standard protocol without IV contrast.

[Series 2: axials routine abdomen pelvis without 5.00 · axial · non-contrast · 0.60mm/px · z∈[-1482,-1107]mm · 13 of 83 slices shown, 15 images]
[im 4/83  soft-tissue]
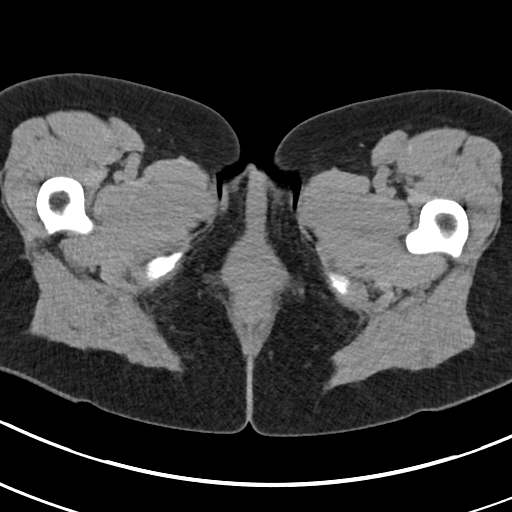
[im 4/83  bone]
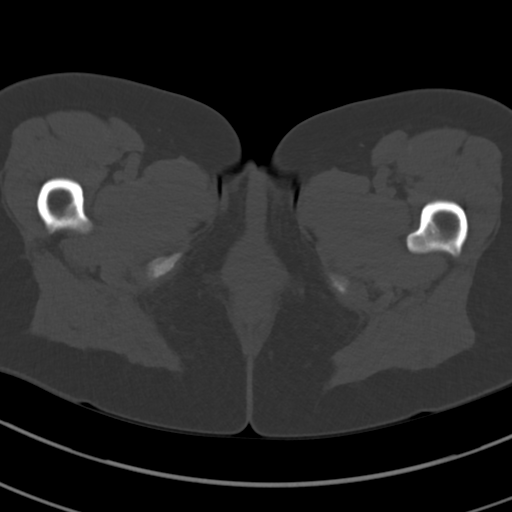
[im 10/83  soft-tissue]
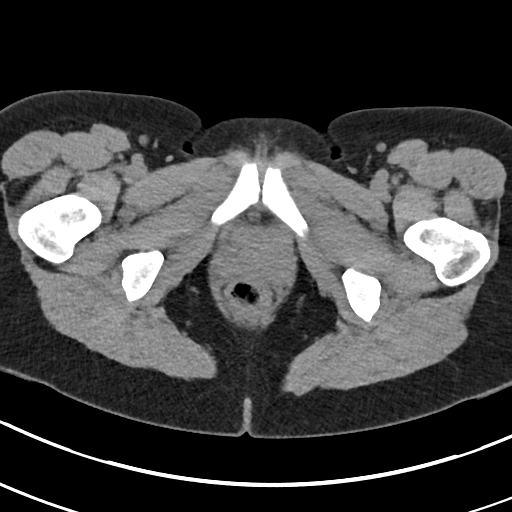
[im 17/83  soft-tissue]
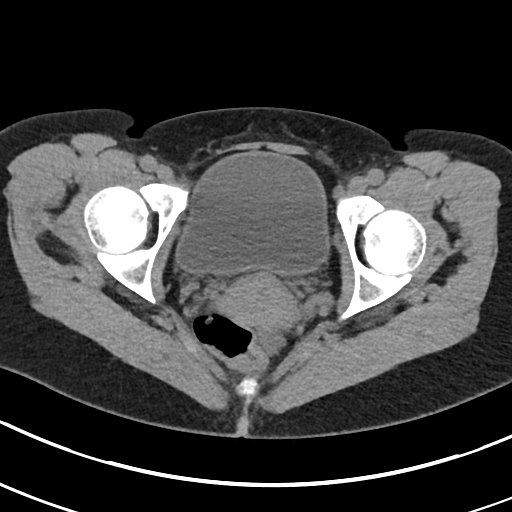
[im 23/83  soft-tissue]
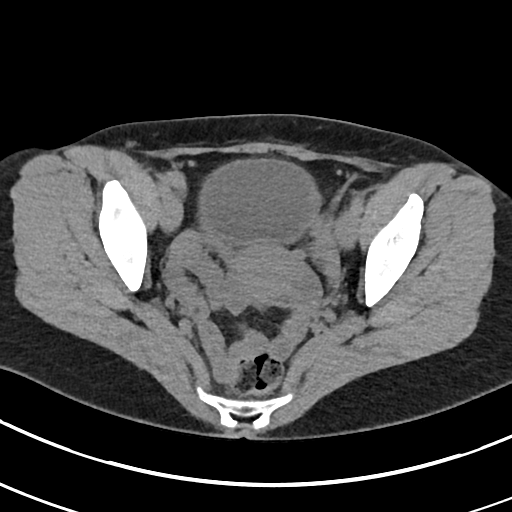
[im 30/83  soft-tissue]
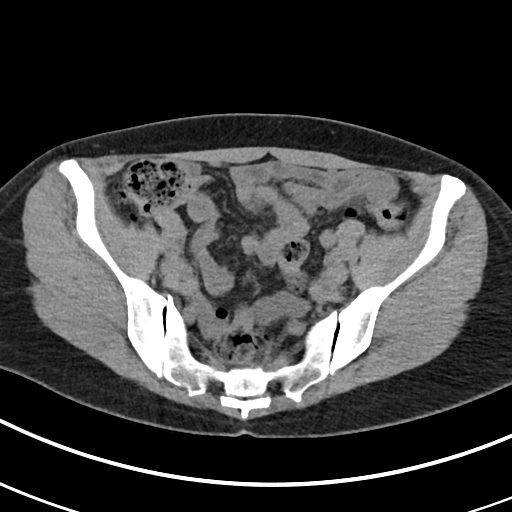
[im 37/83  soft-tissue]
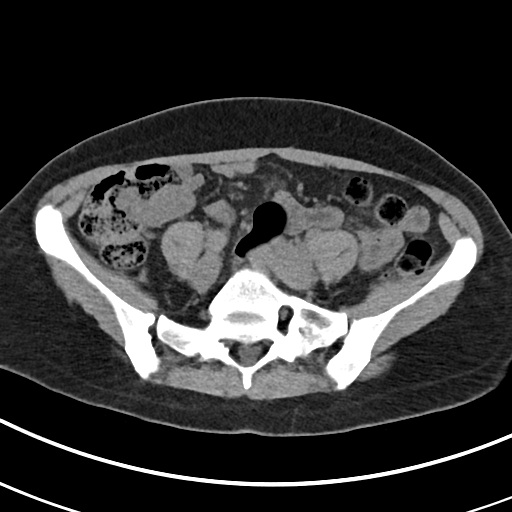
[im 43/83  soft-tissue]
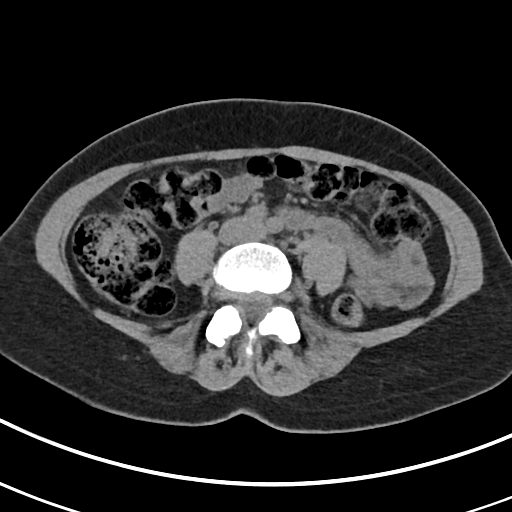
[im 46/83  soft-tissue]
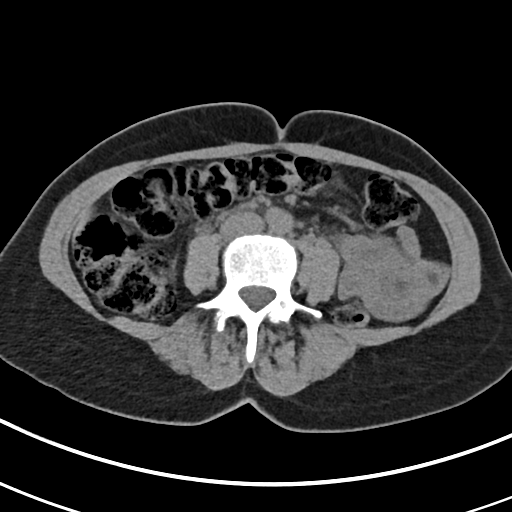
[im 53/83  soft-tissue]
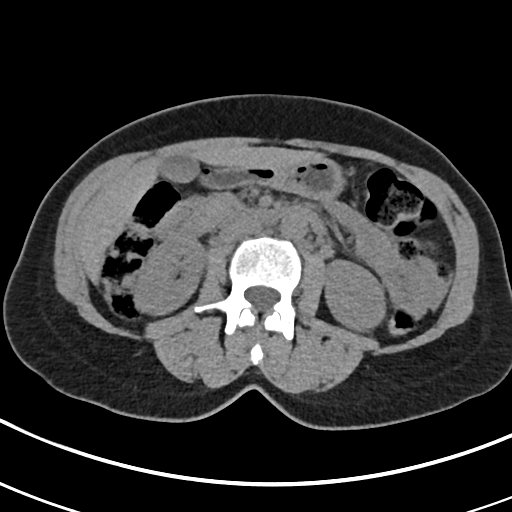
[im 53/83  bone]
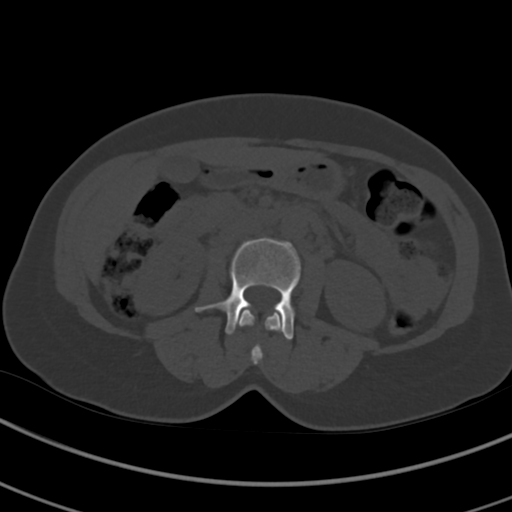
[im 60/83  soft-tissue]
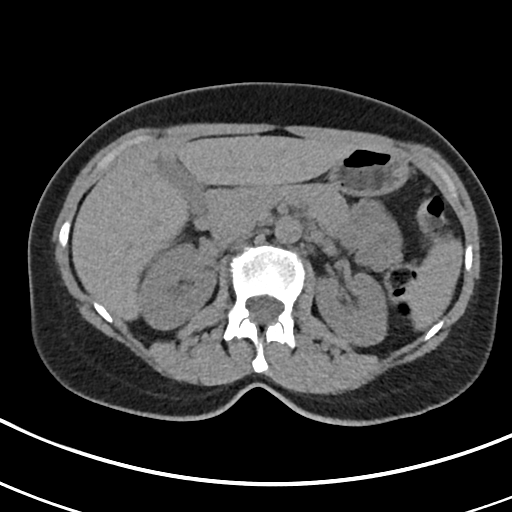
[im 66/83  soft-tissue]
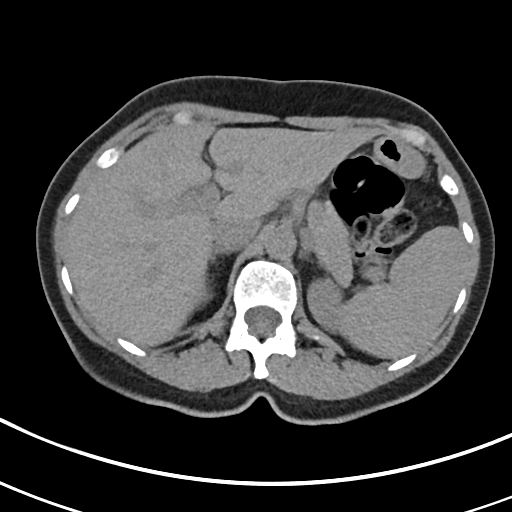
[im 73/83  soft-tissue]
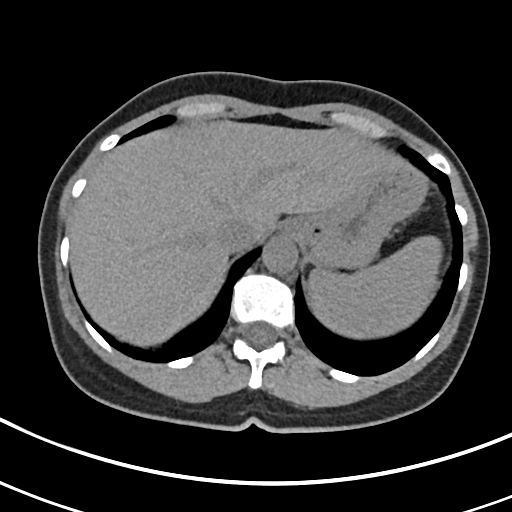
[im 79/83  soft-tissue]
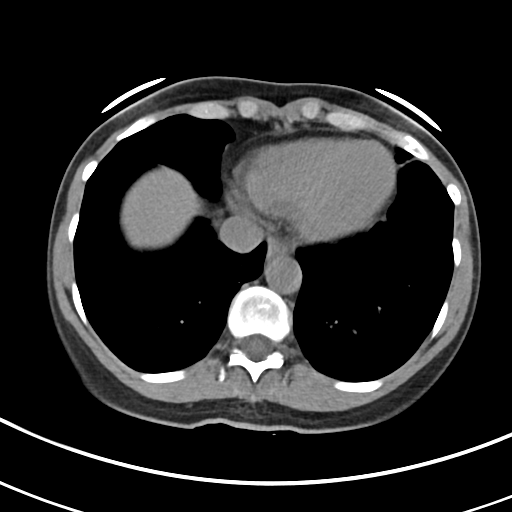

[Series 4: coronals routine abdomen pelvis without 2.00 cor · coronal · non-contrast · 0.60mm/px · 3 of 113 slices shown]
[im 38/113  soft-tissue]
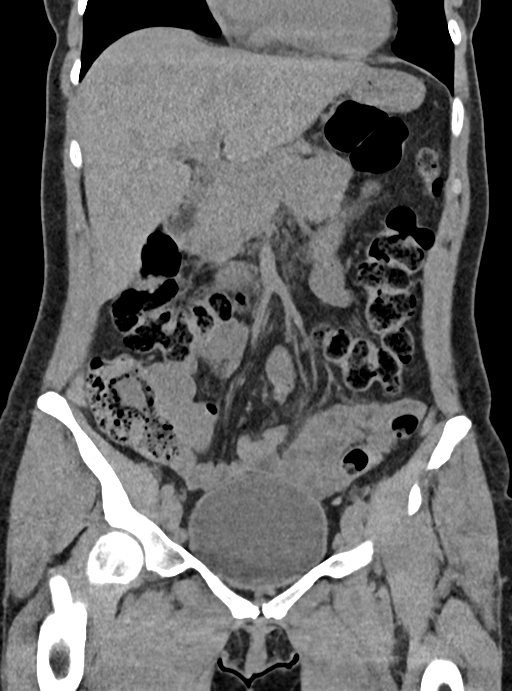
[im 50/113  soft-tissue]
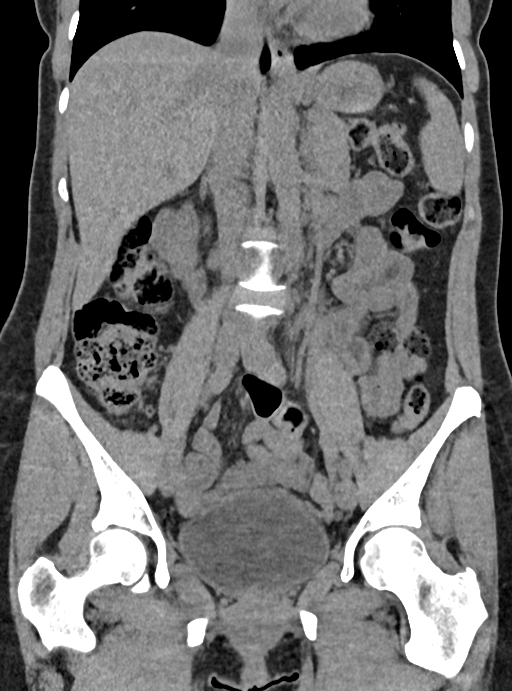
[im 63/113  soft-tissue]
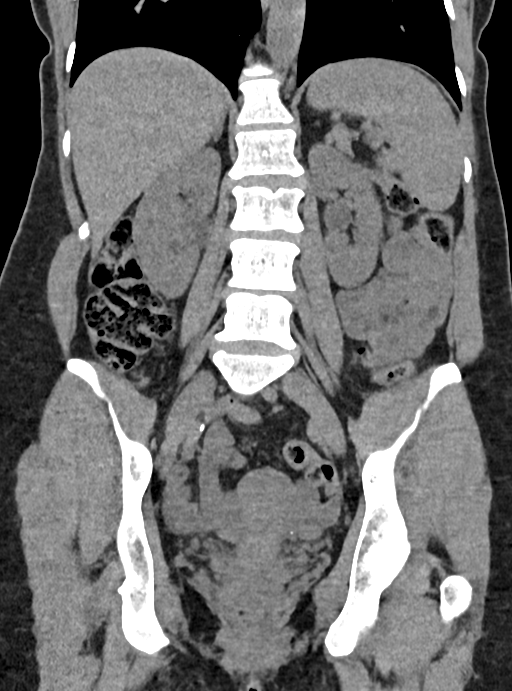

[16 of 46 positions shown; findings below may reference images not displayed]

FINDINGS: Lower chest: Unremarkable.

Hepatobiliary: No suspicious cystic or solid hepatic lesions are
confidently identified on today's noncontrast CT examination.
Unenhanced appearance of the gallbladder is normal.

Pancreas: No definite pancreatic mass or peripancreatic fluid
collections or inflammatory changes are noted on today's noncontrast
CT examination.

Spleen: Unremarkable.

Adrenals/Urinary Tract: There are no abnormal calcifications within
the collecting system of either kidney, along the course of either
ureter, or within the lumen of the urinary bladder. No
hydroureteronephrosis or perinephric stranding to suggest urinary
tract obstruction at this time. The unenhanced appearance of the
kidneys is unremarkable bilaterally. Unenhanced appearance of the
urinary bladder is normal. Bilateral adrenal glands are normal in
appearance.

Stomach/Bowel: Unenhanced appearance of the stomach is normal. No
pathologic dilatation of small bowel or colon. Normal appendix.

Vascular/Lymphatic: Atherosclerotic calcifications in the pelvic
vasculature. No lymphadenopathy noted in the abdomen or pelvis.

Reproductive: Unenhanced appearance of the uterus and ovaries is
unremarkable.

Other: No significant volume of ascites.  No pneumoperitoneum.

Musculoskeletal: There are no aggressive appearing lytic or blastic
lesions noted in the visualized portions of the skeleton.
IMPRESSION: 1. No acute findings are noted to account for the patient's
symptoms. Specifically, no urinary tract calculi no findings of
urinary tract obstruction at this time.
2. Atherosclerosis.
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
## 2019-10-10 NOTE — Patient Instructions (Addendum)
To lab for stat CBC and chemistry panel.   CT scan has been ordered at [facility] and you will be notified of the time.   Further recommendations pending results.

## 2019-10-11 MED ORDER — NITROFURANTOIN MONOHYD MACRO 100 MG PO CAPS: 100.0000 mg | ORAL_CAPSULE | Freq: Two times a day (BID) | ORAL | 0 refills | Status: DC

## 2019-10-11 MED ORDER — PHENAZOPYRIDINE HCL 100 MG PO TABS: 100.0000 mg | ORAL_TABLET | Freq: Three times a day (TID) | ORAL | 0 refills | Status: AC | PRN

## 2019-10-13 ENCOUNTER — Telehealth: Payer: Self-pay | Admitting: Nurse Practitioner

## 2019-10-13 LAB — URINE CULTURE
MICRO NUMBER:: 10836622
SPECIMEN QUALITY:: ADEQUATE

## 2019-10-13 NOTE — Telephone Encounter (Signed)
Please make a 1 week f/up for back pain and UTI sx . Please get a symptom update today.

## 2019-10-13 NOTE — Telephone Encounter (Signed)
Patient scheduled for f/u on 10/23/19 at 8am; patient will be out of town all next week. She is still having her sx and back pain since she only started the antibiotics yesterday. She is hopping they work since they were not sent in when she was seen.

## 2019-10-16 NOTE — Telephone Encounter (Signed)
Pt returned a call but was not sure what it was about

## 2019-10-16 NOTE — Telephone Encounter (Signed)
Patient aware of lab results.

## 2019-10-23 ENCOUNTER — Encounter: Payer: Self-pay | Admitting: Nurse Practitioner

## 2019-10-23 ENCOUNTER — Other Ambulatory Visit: Payer: Self-pay

## 2019-10-23 ENCOUNTER — Ambulatory Visit: Payer: 59 | Admitting: Nurse Practitioner

## 2019-10-23 VITALS — BP 118/82 | HR 60 | Temp 97.5°F | Ht 59.0 in | Wt 127.0 lb

## 2019-10-23 DIAGNOSIS — R109 Unspecified abdominal pain: Secondary | ICD-10-CM | POA: Diagnosis not present

## 2019-10-23 DIAGNOSIS — R300 Dysuria: Secondary | ICD-10-CM | POA: Diagnosis not present

## 2019-10-23 DIAGNOSIS — I700 Atherosclerosis of aorta: Secondary | ICD-10-CM | POA: Diagnosis not present

## 2019-10-23 DIAGNOSIS — Z87440 Personal history of urinary (tract) infections: Secondary | ICD-10-CM | POA: Diagnosis not present

## 2019-10-23 LAB — POCT URINALYSIS DIPSTICK
Bilirubin, UA: NEGATIVE
Blood, UA: NEGATIVE
Glucose, UA: NEGATIVE
Nitrite, UA: NEGATIVE
Protein, UA: POSITIVE — AB
Spec Grav, UA: 1.015 (ref 1.010–1.025)
Urobilinogen, UA: 1 E.U./dL
pH, UA: 7 (ref 5.0–8.0)

## 2019-10-23 LAB — URINALYSIS, MICROSCOPIC ONLY

## 2019-10-23 MED ORDER — NITROFURANTOIN MONOHYD MACRO 100 MG PO CAPS: 100.0000 mg | ORAL_CAPSULE | Freq: Two times a day (BID) | ORAL | 0 refills | Status: DC

## 2019-10-23 NOTE — Assessment & Plan Note (Signed)
Referral to Dr. Rockey Situ. Recommend statin- she wants to speak to Dr. Rockey Situ.  Lab Results  Component Value Date   CHOL 200 04/18/2019   HDL 65 04/18/2019   LDLCALC 118 (H) 04/18/2019   TRIG 84 04/18/2019   CHOLHDL 3.1 04/18/2019
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
## 2019-10-23 NOTE — Patient Instructions (Addendum)
UA and urine culture is pending.  If you think you still may have left flank pain starting today, will retreat with a 5-day course of Macrobid.  Let us know if the symptoms do not resolve and if you continue to have urinary tract infection symptoms I will refer you to the urologist.  Hydrate well.  Monitor for worsening symptoms, nausea, vomiting, fevers, chills, and promptly report.   The CT of the abdomen pelvis did show atherosclerosis.  Referral  to Dr. Rockey Situ to talk about statin, and to consider calcium scoring again.  Follow-up as needed with Dr. Caryl Bis.    Flank Pain, Adult Flank pain is pain that is located on the side of the body between the upper abdomen and the back. This area is called the flank. The pain may occur over a short period of time (acute), or it may be long-term or recurring (chronic). It may be mild or severe. Flank pain can be caused by many things, including:  Muscle soreness or injury.  Kidney stones or kidney disease.  Stress.  A disease of the spine (vertebral disk disease).  A lung infection (pneumonia).  Fluid around the lungs (pulmonary edema).  A skin rash caused by the chickenpox virus (shingles).  Tumors that affect the back of the abdomen.  Gallbladder disease. Follow these instructions at home:   Drink enough fluid to keep your urine clear or pale yellow.  Rest as told by your health care provider.  Take over-the-counter and prescription medicines only as told by your health care provider.  Keep a journal to track what has caused your flank pain and what has made it feel better.  Keep all follow-up visits as told by your health care provider. This is important. Contact a health care provider if:  Your pain is not controlled with medicine.  You have new symptoms.  Your pain gets worse.  You have a fever.  Your symptoms last longer than 2-3 days.  You have trouble urinating or you are urinating very frequently. Get help  right away if:  You have trouble breathing or you are short of breath.  Your abdomen hurts or it is swollen or red.  You have nausea or vomiting.  You feel faint or you pass out.  You have blood in your urine. Summary  Flank pain is pain that is located on the side of the body between the upper abdomen and the back.  The pain may occur over a short period of time (acute), or it may be long-term or recurring (chronic). It may be mild or severe.  Flank pain can be caused by many things.  Contact your health care provider if your symptoms get worse or they last longer than 2-3 days. This information is not intended to replace advice given to you by your health care provider. Make sure you discuss any questions you have with your health care provider. Document Revised: 01/22/2017 Document Reviewed: 04/24/2016 Elsevier Patient Education  [place].

## 2019-10-23 NOTE — Assessment & Plan Note (Signed)
Twinge of pain started in [place]- just like E coli UTI sx a few weeks ago.  CT abd/pelvis WO  negative for stone. UA - slight positive - Check urine culture.Patient wants Macrobid again until Cx is known.

## 2019-10-24 LAB — URINE CULTURE
MICRO NUMBER:: 10888111
Result:: NO GROWTH
SPECIMEN QUALITY:: ADEQUATE

## 2019-12-04 ENCOUNTER — Ambulatory Visit: Payer: 59 | Admitting: Cardiovascular Disease

## 2019-12-04 ENCOUNTER — Encounter: Payer: Self-pay | Admitting: Cardiovascular Disease

## 2019-12-04 ENCOUNTER — Other Ambulatory Visit: Payer: Self-pay

## 2019-12-04 VITALS — BP 110/74 | HR 67 | Ht 60.0 in | Wt 129.0 lb

## 2019-12-04 DIAGNOSIS — R0789 Other chest pain: Secondary | ICD-10-CM | POA: Diagnosis not present

## 2019-12-04 NOTE — Patient Instructions (Signed)
Medication Instructions:  No changes  If you need a refill on your cardiac medications before your next appointment, please call your pharmacy.    Lab work: No new labs needed   If you have labs (blood work) drawn today and your tests are completely normal, you will receive your results only by: . MyChart Message (if you have MyChart) OR . A paper copy in the mail If you have any lab test that is abnormal or we need to change your treatment, we will call you to review the results.   Testing/Procedures: No new testing needed   Follow-Up: At [facility], you and your health needs are our priority.  As part of our continuing mission to provide you with exceptional heart care, we have created designated Provider Care Teams.  These Care Teams include your primary Cardiologist (physician) and Advanced Practice Providers (APPs -  Physician Assistants and Nurse Practitioners) who all work together to provide you with the care you need, when you need it.  . You will need a follow up appointment as needed  . Providers on your designated Care Team:   . Christopher Berge, NP . Ryan Dunn, PA-C . Jacquelyn Visser, PA-C  Any Other Special Instructions Will Be Listed Below (If Applicable).  COVID-19 Vaccine Information can be found at: [email or web address] For questions related to vaccine distribution or appointments, please email [email or web address] or call [phone].     
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
## 2020-01-09 ENCOUNTER — Other Ambulatory Visit: Payer: Self-pay

## 2020-01-09 ENCOUNTER — Ambulatory Visit: Payer: 59 | Admitting: Dermatology

## 2020-01-09 DIAGNOSIS — D229 Melanocytic nevi, unspecified: Secondary | ICD-10-CM

## 2020-01-09 DIAGNOSIS — D2371 Other benign neoplasm of skin of right lower limb, including hip: Secondary | ICD-10-CM | POA: Diagnosis not present

## 2020-01-09 DIAGNOSIS — L814 Other melanin hyperpigmentation: Secondary | ICD-10-CM | POA: Diagnosis not present

## 2020-01-09 DIAGNOSIS — D225 Melanocytic nevi of trunk: Secondary | ICD-10-CM | POA: Diagnosis not present

## 2020-01-09 DIAGNOSIS — L578 Other skin changes due to chronic exposure to nonionizing radiation: Secondary | ICD-10-CM

## 2020-01-09 DIAGNOSIS — L918 Other hypertrophic disorders of the skin: Secondary | ICD-10-CM

## 2020-01-09 DIAGNOSIS — D2271 Melanocytic nevi of right lower limb, including hip: Secondary | ICD-10-CM

## 2020-01-09 DIAGNOSIS — D1800 Hemangioma unspecified site: Secondary | ICD-10-CM | POA: Diagnosis not present

## 2020-01-09 DIAGNOSIS — D485 Neoplasm of uncertain behavior of skin: Secondary | ICD-10-CM

## 2020-01-09 DIAGNOSIS — L853 Xerosis cutis: Secondary | ICD-10-CM

## 2020-01-09 DIAGNOSIS — L570 Actinic keratosis: Secondary | ICD-10-CM

## 2020-01-09 DIAGNOSIS — Z872 Personal history of diseases of the skin and subcutaneous tissue: Secondary | ICD-10-CM

## 2020-01-09 DIAGNOSIS — Z1283 Encounter for screening for malignant neoplasm of skin: Secondary | ICD-10-CM

## 2020-01-09 NOTE — Progress Notes (Signed)
Follow-Up Visit   Subjective  Amanda Skinner is a 59 y.o. female who presents for the following: Annual Exam (Hx Dysplastic Nevus spinal upper back, Hx AK). She has a small residual scaly spot on her nose. The irritation has improved on her nose, as she is not wearing N95 mask as much at work.  She also has a bump on her leg that frequently gets irritated with shaving.  The following portions of the chart were reviewed this encounter and updated as appropriate:      Review of Systems:  No other skin or systemic complaints except as noted in HPI or Assessment and Plan.  Objective  Well appearing patient in no apparent distress; mood and affect are within normal limits.  A full examination was performed including scalp, head, eyes, ears, nose, lips, neck, chest, axillae, abdomen, back, buttocks, bilateral upper extremities, bilateral lower extremities, hands, feet, fingers, toes, fingernails, and toenails. All findings within normal limits unless otherwise noted below.  Objective  Left Nasal Dorsum x 1: Pink scaly macule.  Objective  R spinal lower back: 2.60mm med dark brown macule  R anterior thigh: 2.93mm medium dark brown macule  Right 5th Plantar Toe: 7.42mm flesh papule, gray/brown center  Objective  Right Lateral Calf: 5.0 mm pink firm nodule      Assessment & Plan  AK (actinic keratosis) Left Nasal Dorsum x 1  Small residual  Destruction of lesion - Left Nasal Dorsum x 1  Destruction method: cryotherapy   Informed consent: discussed and consent obtained   Lesion destroyed using liquid nitrogen: Yes   Region frozen until ice ball extended beyond lesion: Yes   Outcome: patient tolerated procedure well with no complications   Post-procedure details: wound care instructions given    Nevus (3) R anterior thigh; R spinal lower back; Right 5th Plantar Toe  Benign-appearing.  Stable. Observation.  Call clinic for new or changing moles.  Recommend daily use of  broad spectrum spf 30+ sunscreen to sun-exposed areas.    Neoplasm of uncertain behavior of skin Right Lateral Calf  Epidermal / dermal shaving  Lesion diameter (cm):  0.6 Informed consent: discussed and consent obtained   Patient was prepped and draped in usual sterile fashion: Area prepped with alcohol. Anesthesia: the lesion was anesthetized in a standard fashion   Anesthetic:  1% lidocaine w/ epinephrine 1-100,000 buffered w/ 8.4% NaHCO3 Instrument used: flexible razor blade   Hemostasis achieved with: pressure, aluminum chloride and electrodesiccation   Outcome: patient tolerated procedure well   Post-procedure details: wound care instructions given   Post-procedure details comment:  Ointment and small bandage applied  Specimen 1 - Surgical pathology Differential Diagnosis: Irritated Dermatofibroma vs other Check Margins: No 5.0 mm pink firm nodule  Skin cancer screening performed today.  Actinic Damage - chronic, secondary to cumulative UV radiation exposure/sun exposure over time - diffuse scaly erythematous macules with underlying dyspigmentation - Recommend daily broad spectrum sunscreen SPF 30+ to sun-exposed areas, reapply every 2 hours as needed.  - Call for new or changing lesions.  Lentigines - Scattered tan macules - Discussed due to sun exposure - Benign, observe - Call for any changes  Hemangiomas - Red papules - Discussed benign nature - Observe - Call for any changes  Acrochordons (Skin Tags) - Fleshy, skin-colored pedunculated papules - Benign appearing.  - Observe. - If desired, they can be removed with an in office procedure that is not covered by insurance. - Please call the clinic if you  notice any new or changing lesions.  Dermatofibroma - Firm pink/brown papulenodule with dimple sign on the R lateral thigh - Benign appearing - Call for any changes  Xerosis - diffuse xerotic patches - recommend gentle, hydrating skin care - gentle  skin care handout given   Melanocytic Nevi - Tan-brown and/or pink-flesh-colored symmetric macules and papules - Benign appearing on exam today - Observation - Call clinic for new or changing moles - Recommend daily use of broad spectrum spf 30+ sunscreen to sun-exposed areas.    Return in about 1 year (around 01/08/2021) for TBSE.   Documentation: I have reviewed the above documentation for accuracy and completeness, and I agree with the above.  Brendolyn Patty MD

## 2020-01-09 NOTE — Patient Instructions (Addendum)
Wound Care Instructions  1. Cleanse wound gently with soap and water once a day then pat dry with clean gauze. Apply a thing coat of Petrolatum (petroleum jelly, "Vaseline") over the wound (unless you have an allergy to this). We recommend that you use a new, sterile tube of Vaseline. Do not pick or remove scabs. Do not remove the yellow or white "healing tissue" from the base of the wound.  2. Cover the wound with fresh, clean, nonstick gauze and secure with paper tape. You may use Band-Aids in place of gauze and tape if the would is small enough, but would recommend trimming much of the tape off as there is often too much. Sometimes Band-Aids can irritate the skin.  3. You should call the office for your biopsy report after 1 week if you have not already been contacted.  4. If you experience any problems, such as abnormal amounts of bleeding, swelling, significant bruising, significant pain, or evidence of infection, please call the office immediately.   Cryotherapy Aftercare  . Wash gently with soap and water everyday.   Marland Kitchen Apply Vaseline and Band-Aid daily until healed.   Dry Skin Care  What causes dry skin?  Dry skin is common and results from inadequate moisture in the outer skin layers. Dry skin usually results from the excessive loss of moisture from the skin surface. This occurs due to two major factors: 1. Normally the skin's oil glands deposit a layer of oil on the skin's surface. This layer of oil prevents the loss of moisture from the skin. Exposure to soaps, cleaners, solvents, and disinfectants removes this oily film, allowing water to escape. 2. Water loss from the skin increases when the humidity is low. During winter months we spend a lot of time indoors where the air is heated. Heated air has very low humidity. This also contributes to dry skin.  A tendency for dry skin may accompany such disorders as eczema. Also, as people age, the number of functioning oil glands  decreases, and the tendency toward dry skin can be a sensation of skin tightness when emerging from the shower.  How do I manage dry skin?  1. Humidify your environment. This can be accomplished by using a humidifier in your bedroom at night during winter months. 2. Bathing can actually put moisture back into your skin if done right. Take the following steps while bathing to sooth dry skin:  Avoid hot water, which only dries the skin and makes itching worse. Use warm water.  Avoid washcloths or extensive rubbing or scrubbing.  Use mild soaps like unscented Dove, Oil of Olay, Cetaphil, Basis, or CeraVe.  If you take baths rather than showers, rinse off soap residue with clean water before getting out of tub.  Once out of the shower/tub, pat dry gently with a soft towel. Leave your skin damp.  While still damp, apply any medicated ointment/cream you were prescribed to the affected areas. After you apply your medicated ointment/cream, then apply your moisturizer to your whole body.This is the most important step in dry skin care. If this is omitted, your skin will continue to be dry.  The choice of moisturizer is also very important. In general, lotion will not provider enough moisture to severely dry skin because it is water based. You should use an ointment or cream. Moisturizers should also be unscented. Good choices include Vaseline (plain petrolatum), Aquaphor, Cetaphil, CeraVe, Vanicream, DML Forte, Aveeno moisture, or Eucerin Cream.  Bath oils can be helpful,  but do not replace the application of moisturizer after the bath. In addition, they make the tub slippery causing an increased risk for falls. Therefore, we do not recommend their use.

## 2020-01-15 ENCOUNTER — Telehealth: Payer: Self-pay

## 2020-01-15 NOTE — Telephone Encounter (Signed)
-----   Message from Brendolyn Patty, MD sent at 01/15/2020  9:09 AM EST ----- Skin , right lateral calf SURFACE OF A DERMATOFIBROMA  Benign

## 2020-01-15 NOTE — Telephone Encounter (Signed)
Advised pt pathology results/sh

## 2020-04-08 DIAGNOSIS — H5203 Hypermetropia, bilateral: Secondary | ICD-10-CM | POA: Diagnosis not present

## 2020-04-11 ENCOUNTER — Other Ambulatory Visit: Payer: Self-pay

## 2020-04-15 ENCOUNTER — Other Ambulatory Visit: Payer: Self-pay | Admitting: Family Medicine

## 2020-04-15 ENCOUNTER — Ambulatory Visit (INDEPENDENT_AMBULATORY_CARE_PROVIDER_SITE_OTHER): Payer: 59 | Admitting: Family Medicine

## 2020-04-15 ENCOUNTER — Encounter: Payer: Self-pay | Admitting: Family Medicine

## 2020-04-15 ENCOUNTER — Other Ambulatory Visit: Payer: Self-pay

## 2020-04-15 VITALS — BP 100/60 | HR 84 | Temp 98.0°F | Ht 59.0 in | Wt 128.2 lb

## 2020-04-15 DIAGNOSIS — J309 Allergic rhinitis, unspecified: Secondary | ICD-10-CM | POA: Diagnosis not present

## 2020-04-15 DIAGNOSIS — Z0000 Encounter for general adult medical examination without abnormal findings: Secondary | ICD-10-CM

## 2020-04-15 DIAGNOSIS — E663 Overweight: Secondary | ICD-10-CM | POA: Diagnosis not present

## 2020-04-15 DIAGNOSIS — Z1231 Encounter for screening mammogram for malignant neoplasm of breast: Secondary | ICD-10-CM

## 2020-04-15 DIAGNOSIS — Z13220 Encounter for screening for lipoid disorders: Secondary | ICD-10-CM

## 2020-04-15 MED ORDER — AZELASTINE HCL 0.1 % NA SOLN: 2.0000 | Freq: Two times a day (BID) | NASAL | 12 refills | Status: DC

## 2020-04-15 NOTE — Progress Notes (Signed)
Tommi Rumps, MD Phone: [phone]  Amanda Skinner is a 60 y.o. female who presents today for CPE.  Diet: Generally healthy, has decreased portion sizes.  Avoid soda. Exercise: Walking and lifting weights at home.  Has her sister to exercise with. Pap smear: 08/24/2019 NILM negative HPV through gynecology Colonoscopy: Up-to-date, 08/21/2013-she will get [place] the records that she has them at home Mammogram: 05/12/2019, negative Family history-  Colon cancer: No  Breast cancer: No  Ovarian cancer: No Menses: Postmenopausal Vaccines-   Flu: Up-to-date  Tetanus: Up-to-date, she will check with employee health to get [place] a date  Shingles: Defers  COVID19: Up-to-date HIV screening: Up-to-date Hep C Screening: Up-to-date Tobacco use: no Alcohol use: no Illicit Drug use: no Dentist: yes Ophthalmology: yes  Allergic rhinitis: This is an ongoing issue.  She has sinus congestion and pressure if she does not take the Sudafed.  She also uses cetirizine.  She uses Flonase on an as-needed basis.  She notes if she skips the Sudafed she developed significant sinus pressure particularly on the left side of her face.  This has been an ongoing issue for quite some time.   Active Ambulatory Problems    Diagnosis Date Noted  . Diffuse cystic mastopathy 05/11/2012  . Bilateral hand pain 10/08/2014  . Mild intermittent asthma 10/09/2014  . Allergic rhinitis 10/09/2014  . Musculoskeletal chest pain 01/29/2015  . Atypical chest pain 02/01/2015  . Family history of coronary artery disease 02/01/2015  . Morton's metatarsalgia, neuralgia, 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  . Fibromyalgia 06/05/2015  . GERD (gastroesophageal reflux disease) 01/01/2016  . Routine general medical examination at a health care facility 04/03/2016  . Postmenopause atrophic vaginitis 08/03/2016  . Endometrial polyp 06/15/2017  . Joint pain 08/31/2018  . 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 Bilateral flank  pain 10/10/2019  . Dysuria 10/10/2019  . History of UTI 10/23/2019  . Left flank pain 10/23/2019  . Aortic atherosclerosis ([place]) 10/23/2019   Resolved Ambulatory Problems    Diagnosis Date Noted  . Unstable angina ([place]) 01/21/2015  . Ringworm 06/05/2015  . Postmenopausal bleeding 06/15/2017  . Tick bite of abdomen 08/31/2018   Past Medical History:  Diagnosis Date  . Asthma 2001  . Atypical mole 07/30/2006  . Chronic sinusitis   . Complication of anesthesia   . Depression   . Dysplastic nevus 07/30/2006  . PONV (postoperative nausea and vomiting)   . Seasonal allergies   . Ulcer     Family History  Problem Relation Age of Onset  . Heart attack Mother   . Heart disease Mother        died from aortic dissection during CABG surgery  . Hypertension Mother   . Stroke Mother   . Heart disease Maternal Grandmother   . Heart disease Maternal Uncle   . Breast cancer Neg Hx     Social History   Socioeconomic History  . Marital status: Married    Spouse name: Nassar  . Number of children: 2  . Years of education: Not on file  . Highest education level: Not on file  Occupational History  . Occupation: Equities trader  Tobacco Use  . Smoking status: Never Smoker  . Smokeless tobacco: Never Used  Vaping Use  . Vaping Use: Never used  Substance and Sexual Activity  . Alcohol use: Yes    Comment: rarely  . Drug use: No  . Sexual activity: Yes    Birth control/protection: Post-menopausal  Other Topics Concern  .  Not on file  Social History Narrative  . Not on file   Social Determinants of Health   Financial Resource Strain: Not on file  Food Insecurity: Not on file  Transportation Needs: Not on file  Physical Activity: Not on file  Stress: Not on file  Social Connections: Not on file  Intimate Partner Violence: Not on file    ROS  General:  Negative for nexplained weight loss, fever Skin: Negative for new or changing mole, sore that won't heal HEENT:  Negative for trouble hearing, trouble seeing, ringing in ears, mouth sores, hoarseness, change in voice, dysphagia. CV:  Negative for chest pain, dyspnea, edema, palpitations Resp: Negative for cough, dyspnea, hemoptysis GI: Negative for nausea, vomiting, diarrhea, constipation, abdominal pain, melena, hematochezia. GU: Negative for dysuria, incontinence, urinary hesitance, hematuria, vaginal or penile discharge, polyuria, sexual difficulty, lumps in testicle or breasts MSK: Negative for muscle cramps or aches, joint pain or swelling Neuro: Negative for headaches, weakness, numbness, dizziness, passing out/fainting Psych: Negative for depression, anxiety, memory problems  Objective  Physical Exam Vitals:   04/15/20 1424  BP: 100/60  Pulse: 84  Temp: 98 F (36.7 C)  SpO2: 98%    BP Readings from Last 3 Encounters:  04/15/20 100/60  12/04/19 110/74  10/23/19 118/82   Wt Readings from Last 3 Encounters:  04/15/20 128 lb 3.2 oz (58.2 kg)  12/04/19 129 lb (58.5 kg)  10/23/19 127 lb (57.6 kg)    Physical Exam Constitutional:      General: She is not in acute distress.    Appearance: She is not diaphoretic.  HENT:     Head: Normocephalic and atraumatic.  Eyes:     Conjunctiva/sclera: Conjunctivae normal.     Pupils: Pupils are equal, round, and reactive to light.  Cardiovascular:     Rate and Rhythm: Normal rate and regular rhythm.     Heart sounds: Normal heart sounds.  Pulmonary:     Effort: Pulmonary effort is normal.     Breath sounds: Normal breath sounds.  Abdominal:     General: Bowel sounds are normal. There is no distension.     Palpations: Abdomen is soft.     Tenderness: There is no abdominal tenderness. There is no guarding or rebound.  Musculoskeletal:        General: No edema.     Right lower leg: No edema.  Lymphadenopathy:     Cervical: No cervical adenopathy.  Skin:    General: Skin is warm and dry.  Neurological:     Mental Status: She is alert.   Psychiatric:        Mood and Affect: Mood normal.      Assessment/Plan:   Problem List Items Addressed This Visit    Allergic rhinitis    Advised against daily use of pseudoephedrine.  Discussed discontinuing this and using Flonase and starting on Astelin on a daily basis.  She can continue cetirizine.  If there is no benefit from the [place] she will let us know we can refer to an allergist.      Relevant Medications   azelastine (ASTELIN) 0.1 % nasal spray   Routine general medical examination at a health care facility - Primary    Physical exam completed.  Continue healthy diet and exercise.  Mammogram ordered.  Pelvic exam and breast exam deferred to gynecology.  She will get [place] a copy of her colonoscopy report.  She will check with employee health regarding her tetanus vaccine.  She  will consider getting the Shingrix vaccine.  Lab work as outlined.       Other Visit Diagnoses    Lipid screening       Relevant Orders   Lipid panel   Comp Met (CMET)   Overweight       Relevant Orders   HgB A1c   Encounter for screening mammogram for malignant neoplasm of breast       Relevant Orders   MM 3D SCREEN BREAST BILATERAL      This visit occurred during the SARS-CoV-2 public health emergency.  Safety protocols were in place, including screening questions prior to the visit, additional usage of staff PPE, and extensive cleaning of exam room while observing appropriate contact time as indicated for disinfecting solutions.    Tommi Rumps, MD [place]

## 2020-04-15 NOTE — Assessment & Plan Note (Signed)
Physical exam completed.  Continue healthy diet and exercise.  Mammogram ordered.  Pelvic exam and breast exam deferred to gynecology.  She will get [place] a copy of her colonoscopy report.  She will check with employee health regarding her tetanus vaccine.  She will consider getting the Shingrix vaccine.  Lab work as outlined.

## 2020-04-15 NOTE — Assessment & Plan Note (Signed)
Advised against daily use of pseudoephedrine.  Discussed discontinuing this and using Flonase and starting on Astelin on a daily basis.  She can continue cetirizine.  If there is no benefit from the Astelin she will let us know we can refer to an allergist.

## 2020-04-15 NOTE — Patient Instructions (Signed)
Nice to see you. We will check lab work today. Please call to schedule your mammogram. Please check on your tetanus vaccine. Please get [place] a copy of your colonoscopy.

## 2020-04-16 LAB — COMPREHENSIVE METABOLIC PANEL
ALT: 13 U/L (ref 0–35)
AST: 16 U/L (ref 0–37)
Albumin: 4.6 g/dL (ref 3.5–5.2)
Alkaline Phosphatase: 67 U/L (ref 39–117)
BUN: 17 mg/dL (ref 6–23)
CO2: 31 mEq/L (ref 19–32)
Calcium: 10 mg/dL (ref 8.4–10.5)
Chloride: 99 mEq/L (ref 96–112)
Creatinine, Ser: 0.93 mg/dL (ref 0.40–1.20)
GFR: 66.76 mL/min (ref >60.00)
Glucose, Bld: 99 mg/dL (ref 70–99)
Potassium: 3.9 mEq/L (ref 3.5–5.1)
Sodium: 139 mEq/L (ref 135–145)
Total Bilirubin: 0.5 mg/dL (ref 0.2–1.2)
Total Protein: 7.1 g/dL (ref 6.0–8.3)

## 2020-04-16 LAB — LIPID PANEL
Cholesterol: 194 mg/dL (ref 0–200)
HDL: 75.2 mg/dL (ref >39.00)
LDL Cholesterol: 109 mg/dL — ABNORMAL HIGH (ref 0–99)
NonHDL: 118.96
Total CHOL/HDL Ratio: 3
Triglycerides: 49 mg/dL (ref 0.0–149.0)
VLDL: 9.8 mg/dL (ref 0.0–40.0)

## 2020-04-16 LAB — HEMOGLOBIN A1C: Hgb A1c MFr Bld: 5.1 % (ref 4.6–6.5)

## 2020-04-17 ENCOUNTER — Encounter: Payer: Self-pay | Admitting: Family Medicine

## 2020-05-16 ENCOUNTER — Ambulatory Visit
Admission: RE | Admit: 2020-05-16 | Discharge: 2020-05-16 | Disposition: A | Discharge status: Home / Self Care | Payer: 59 | Source: Ambulatory Visit | Attending: Family Medicine | Admitting: Family Medicine

## 2020-05-16 ENCOUNTER — Other Ambulatory Visit: Payer: Self-pay

## 2020-05-16 DIAGNOSIS — Z1231 Encounter for screening mammogram for malignant neoplasm of breast: Secondary | ICD-10-CM | POA: Insufficient documentation

## 2020-05-16 IMAGING — MG MM DIGITAL SCREENING BILAT W/ TOMO AND CAD
8 series · 8 of 24 positions shown · non-contrast
Comparison: Previous exam(s).

CLINICAL DATA: Screening.

EXAM:
DIGITAL SCREENING BILATERAL MAMMOGRAM WITH TOMOSYNTHESIS AND CAD
TECHNIQUE: Bilateral screening digital craniocaudal and mediolateral oblique
mammograms were obtained. Bilateral screening digital breast
tomosynthesis was performed. The images were evaluated with
computer-aided detection.

[R MLO synth-2D]
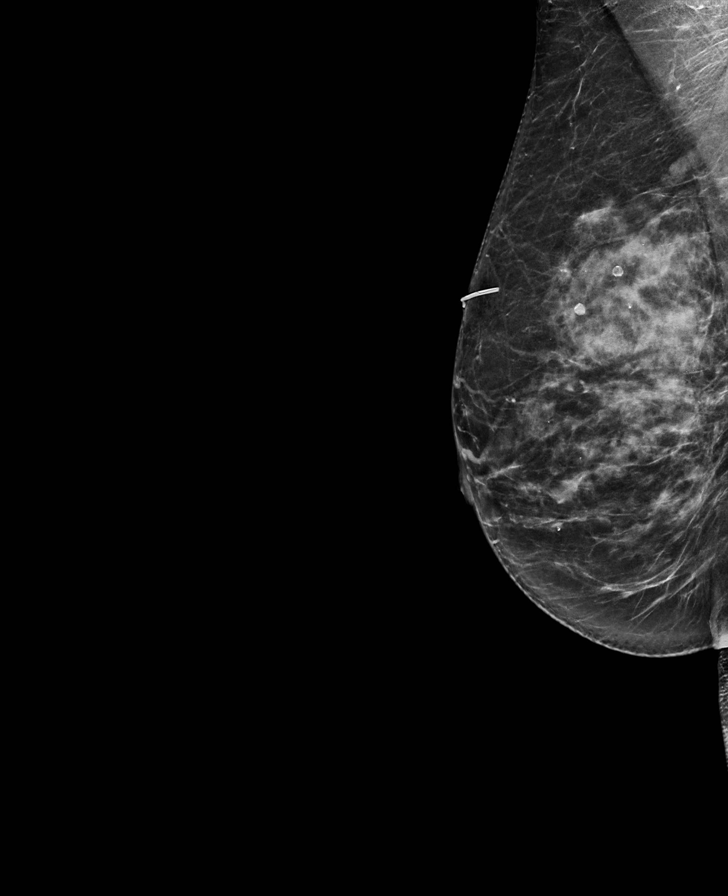

[L MLO synth-2D]
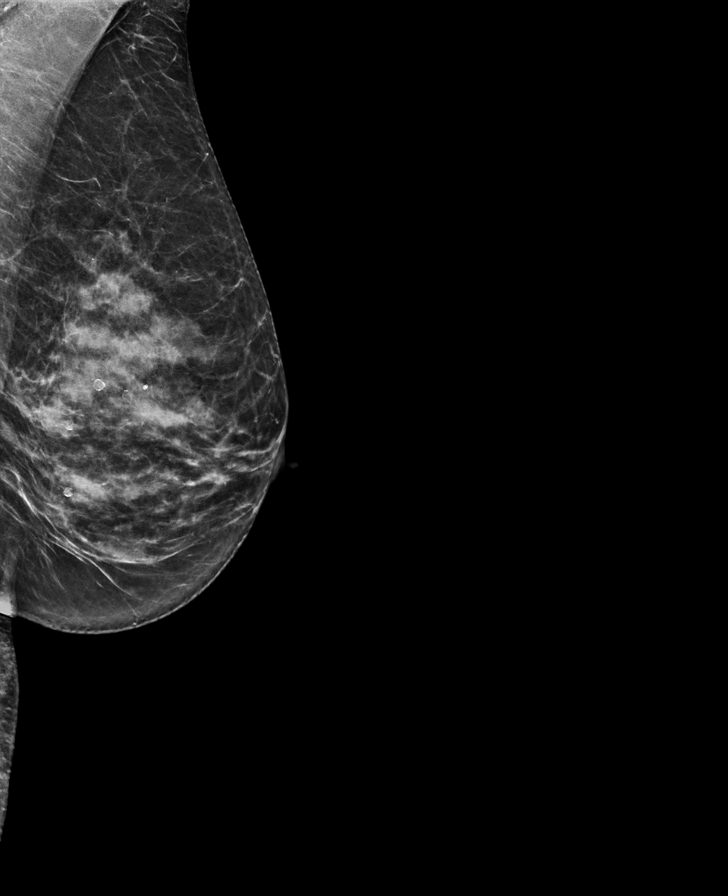

[R CC synth-2D]
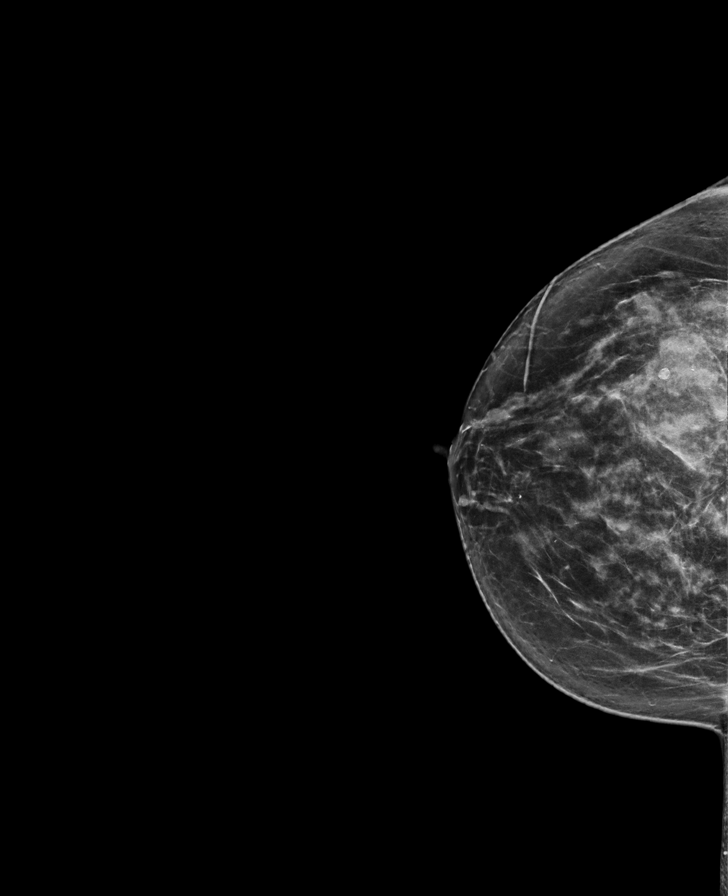

[L CC synth-2D]
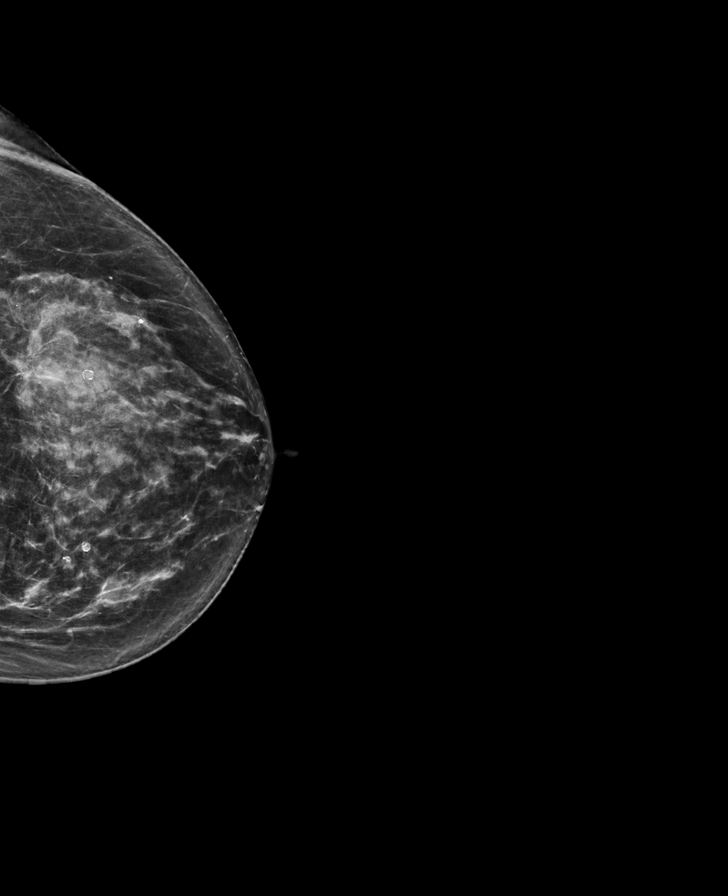

[L MLO tomo · tomo slice 33/65.0]
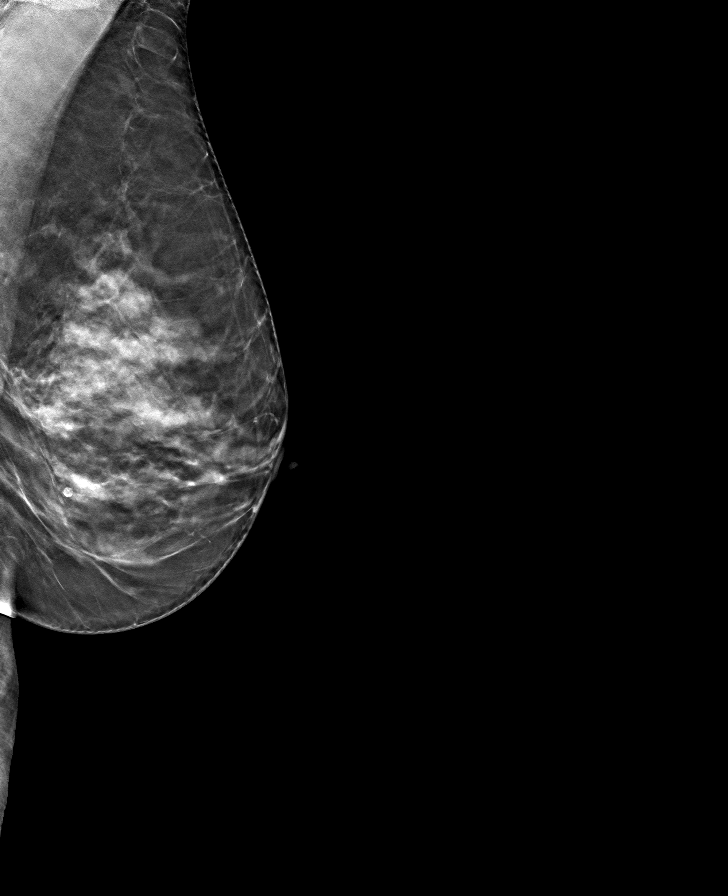

[R MLO tomo · tomo slice 37/72.0]
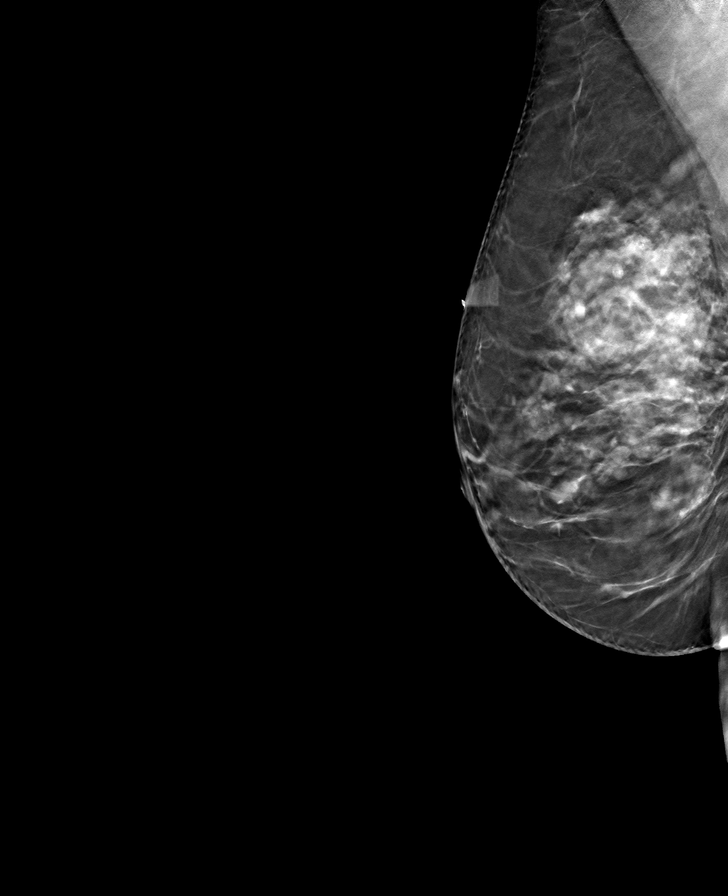

[R CC tomo · tomo slice 36/71.0]
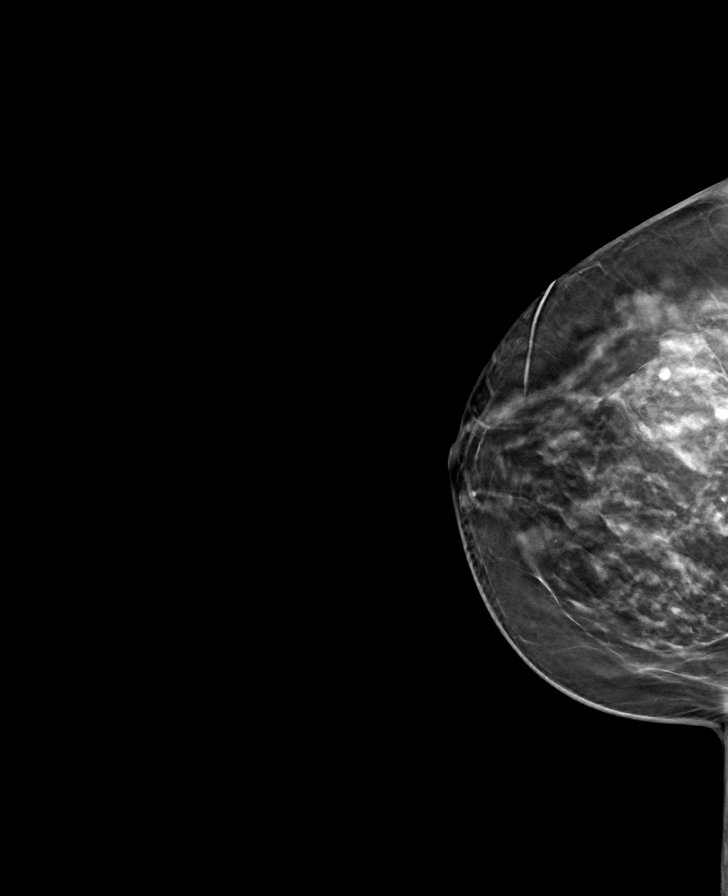

[L CC tomo · tomo slice 35/70.0]
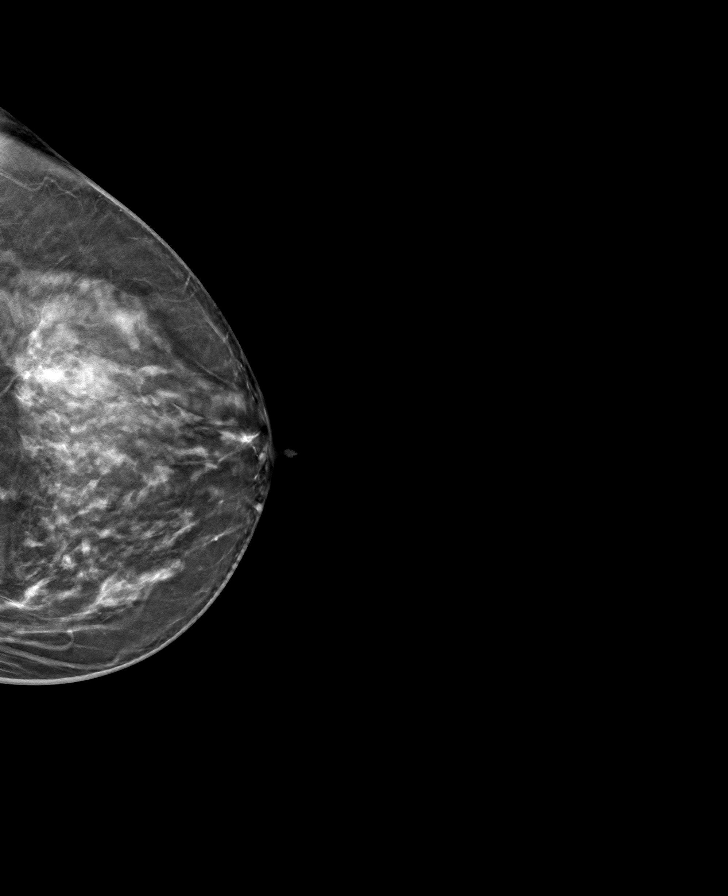

[8 of 24 positions shown; findings below may reference images not displayed]

ACR Breast Density Category c: The breast tissue is heterogeneously
dense, which may obscure small masses.
FINDINGS: There are no findings suspicious for malignancy. The images were
evaluated with computer-aided detection.
IMPRESSION: No mammographic evidence of malignancy. A result letter of this
screening mammogram will be mailed directly to the patient.

RECOMMENDATION:
Screening mammogram in one year. (Code:[6J])

BI-RADS CATEGORY  1: Negative.

## 2020-07-12 ENCOUNTER — Ambulatory Visit
Admission: EM | Admit: 2020-07-12 | Discharge: 2020-07-12 | Disposition: A | Discharge status: Home / Self Care | Payer: 59 | Attending: Emergency Medicine | Admitting: Emergency Medicine

## 2020-07-12 ENCOUNTER — Other Ambulatory Visit: Payer: Self-pay

## 2020-07-12 DIAGNOSIS — Z23 Encounter for immunization: Secondary | ICD-10-CM | POA: Diagnosis not present

## 2020-07-12 DIAGNOSIS — J0100 Acute maxillary sinusitis, unspecified: Secondary | ICD-10-CM

## 2020-07-12 DIAGNOSIS — S91331A Puncture wound without foreign body, right foot, initial encounter: Secondary | ICD-10-CM | POA: Diagnosis not present

## 2020-07-12 MED ORDER — TETANUS-DIPHTH-ACELL PERTUSSIS 5-2.5-18.5 LF-MCG/0.5 IM SUSY
0.5000 mL | PREFILLED_SYRINGE | Freq: Once | INTRAMUSCULAR | Status: AC
  Administered 2020-07-12: 0.5 mL via INTRAMUSCULAR

## 2020-07-12 MED ORDER — AMOXICILLIN 875 MG PO TABS
875.0000 mg | ORAL_TABLET | Freq: Two times a day (BID) | ORAL | 0 refills | Status: AC
  Filled 2020-07-12: qty 14, 7d supply, fill #0
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
## 2020-07-12 NOTE — Discharge Instructions (Signed)
Tetanus was updated today.  See the attached information on puncture wound care.    Take the amoxicillin as directed for your sinus infection.  See the attached information on acute sinus infection.    Follow up with your primary care provider if your symptoms are not improving.

## 2020-07-12 NOTE — ED Triage Notes (Addendum)
Patient presents to Urgent Care with complaints of right foot injury yesterday from a nail. She states her Tetanus vaccine has expired.

## 2020-07-23 ENCOUNTER — Other Ambulatory Visit: Payer: Self-pay

## 2020-07-23 MED ORDER — ESTRADIOL 0.1 MG/GM VA CREA
TOPICAL_CREAM | VAGINAL | 0 refills | Status: DC
  Filled 2020-07-23: qty 42.5, 90d supply, fill #0

## 2020-07-23 NOTE — Telephone Encounter (Signed)
Pt calling for refill of estradiol; has appt sched in July; will run out before then.  [phone]  Pt aware refill eRx'd.

## 2020-07-24 ENCOUNTER — Other Ambulatory Visit: Payer: Self-pay

## 2020-07-24 MED ORDER — CARESTART COVID-19 HOME TEST VI KIT
PACK | 0 refills | Status: DC
  Filled 2020-07-24: qty 4, 4d supply, fill #0

## 2020-08-27 ENCOUNTER — Other Ambulatory Visit (HOSPITAL_COMMUNITY)
Admission: RE | Admit: 2020-08-27 | Discharge: 2020-08-27 | Disposition: A | Discharge status: Home / Self Care | Payer: 59 | Source: Ambulatory Visit | Attending: Obstetrics and Gynecology | Admitting: Obstetrics and Gynecology

## 2020-08-27 ENCOUNTER — Ambulatory Visit (INDEPENDENT_AMBULATORY_CARE_PROVIDER_SITE_OTHER): Payer: 59 | Admitting: Obstetrics and Gynecology

## 2020-08-27 ENCOUNTER — Other Ambulatory Visit: Payer: Self-pay

## 2020-08-27 ENCOUNTER — Encounter: Payer: Self-pay | Admitting: Obstetrics and Gynecology

## 2020-08-27 VITALS — BP 120/80 | Ht 59.0 in | Wt 130.0 lb

## 2020-08-27 DIAGNOSIS — Z124 Encounter for screening for malignant neoplasm of cervix: Secondary | ICD-10-CM | POA: Insufficient documentation

## 2020-08-27 DIAGNOSIS — Z01419 Encounter for gynecological examination (general) (routine) without abnormal findings: Secondary | ICD-10-CM

## 2020-08-27 DIAGNOSIS — Z1339 Encounter for screening examination for other mental health and behavioral disorders: Secondary | ICD-10-CM | POA: Diagnosis not present

## 2020-08-27 DIAGNOSIS — Z1331 Encounter for screening for depression: Secondary | ICD-10-CM
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
## 2020-08-27 NOTE — Addendum Note (Signed)
Addended by: Prentice Docker D on: 08/27/2020 05:25 PM   Modules accepted: Orders

## 2020-08-30 LAB — CYTOLOGY - PAP
Comment: NEGATIVE
Diagnosis: NEGATIVE
High risk HPV: NEGATIVE

## 2020-09-04 ENCOUNTER — Other Ambulatory Visit: Payer: Self-pay

## 2020-09-04 MED ORDER — CARESTART COVID-19 HOME TEST VI KIT
PACK | 0 refills | Status: DC
  Filled 2020-09-04: qty 2, 4d supply, fill #0

## 2020-09-23 DIAGNOSIS — C50919 Malignant neoplasm of unspecified site of unspecified female breast: Secondary | ICD-10-CM

## 2020-09-23 HISTORY — DX: Malignant neoplasm of unspecified site of unspecified female breast: C50.919

## 2020-10-09 ENCOUNTER — Other Ambulatory Visit: Payer: Self-pay | Admitting: Obstetrics and Gynecology

## 2020-10-09 ENCOUNTER — Other Ambulatory Visit: Payer: Self-pay

## 2020-10-09 ENCOUNTER — Ambulatory Visit: Payer: 59 | Admitting: Obstetrics and Gynecology

## 2020-10-09 ENCOUNTER — Encounter: Payer: Self-pay | Admitting: Obstetrics and Gynecology

## 2020-10-09 VITALS — BP 110/88 | Ht 59.0 in | Wt 130.0 lb

## 2020-10-09 DIAGNOSIS — R2231 Localized swelling, mass and lump, right upper limb: Secondary | ICD-10-CM
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
## 2020-10-14 ENCOUNTER — Ambulatory Visit
Admission: RE | Admit: 2020-10-14 | Discharge: 2020-10-14 | Disposition: A | Discharge status: Home / Self Care | Payer: 59 | Source: Ambulatory Visit | Attending: Obstetrics and Gynecology | Admitting: Obstetrics and Gynecology

## 2020-10-14 ENCOUNTER — Other Ambulatory Visit: Payer: Self-pay

## 2020-10-14 DIAGNOSIS — R2231 Localized swelling, mass and lump, right upper limb: Secondary | ICD-10-CM | POA: Insufficient documentation

## 2020-10-14 DIAGNOSIS — R922 Inconclusive mammogram: Secondary | ICD-10-CM | POA: Diagnosis not present

## 2020-10-14 IMAGING — MG MM DIGITAL DIAGNOSTIC UNILAT*R* W/ TOMO W/ CAD
6 of 10 series · 6 of 30 positions shown · non-contrast
Comparison: Previous exam(s).

CLINICAL DATA: Patient complains of a palpable right breast mass.
History of excisional biopsy in the right breast in [7C].

EXAM:
DIGITAL DIAGNOSTIC UNILATERAL RIGHT MAMMOGRAM WITH TOMOSYNTHESIS AND
CAD; ULTRASOUND RIGHT BREAST LIMITED
TECHNIQUE: Right digital diagnostic mammography and breast tomosynthesis was
performed. The images were evaluated with computer-aided detection.;
Targeted ultrasound examination of the right breast was performed

[R MLO synth-2D (1 of 2)]
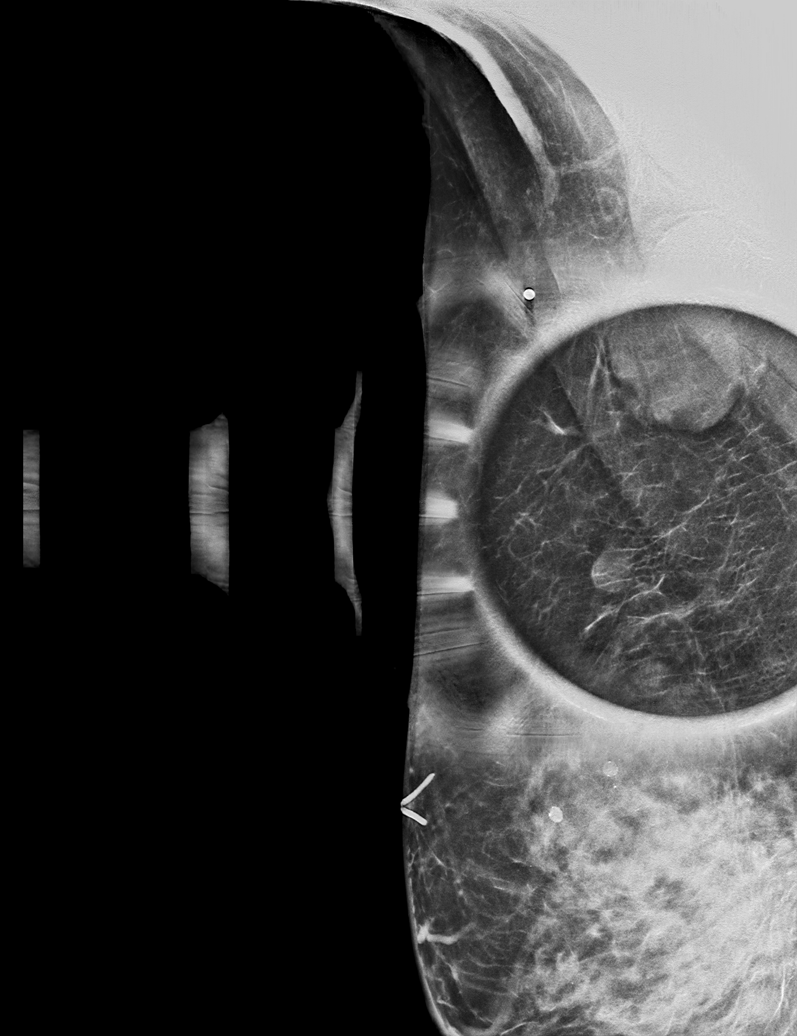

[R MLO synth-2D (2 of 2)]
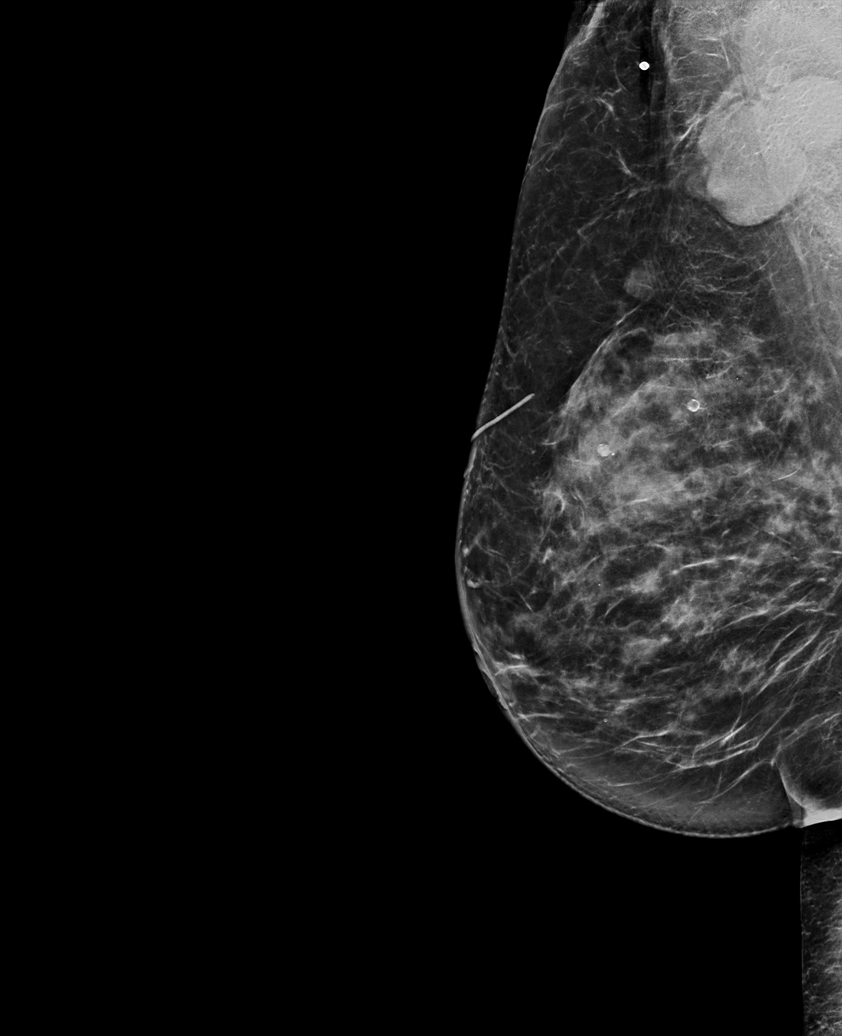

[R CC synth-2D]
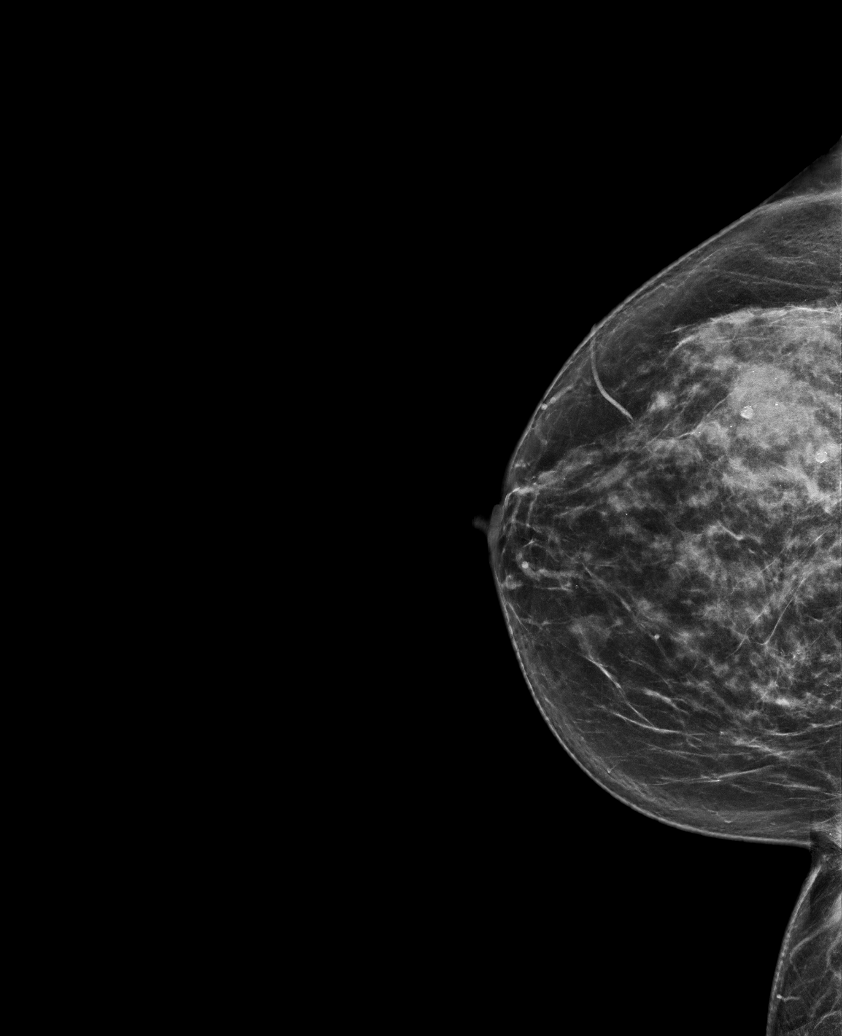

[R XCCL synth-2D]
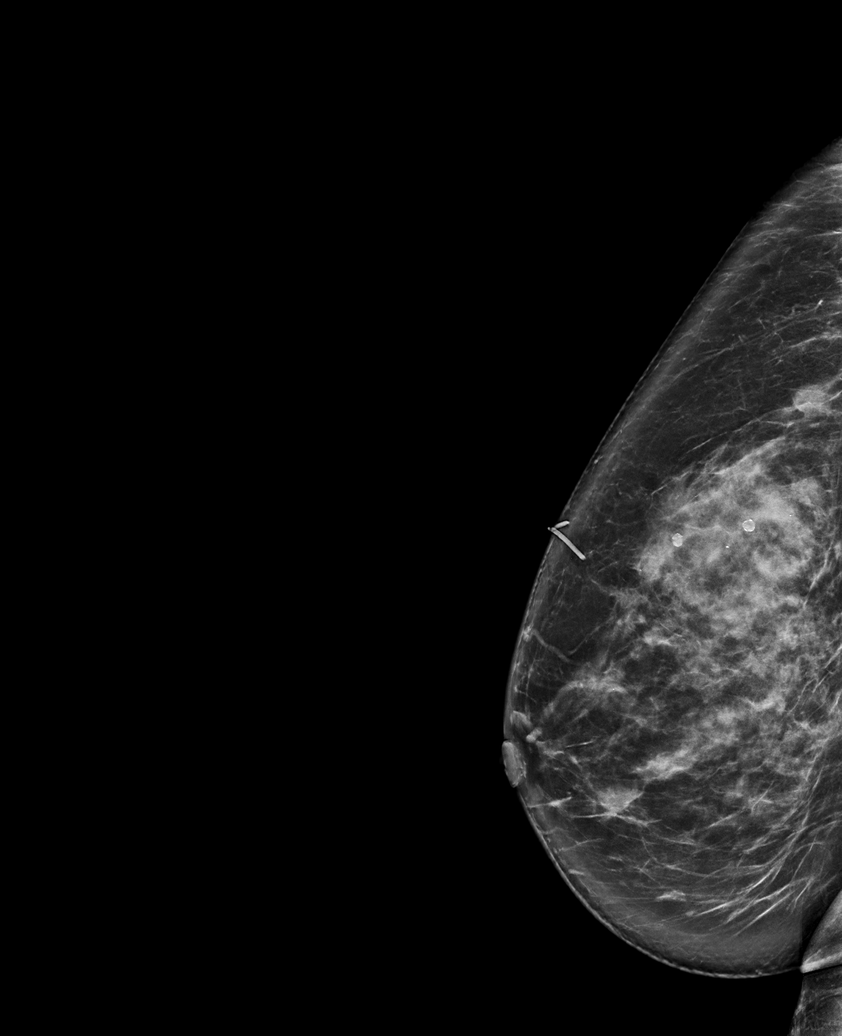

[R TAN synth-2D]
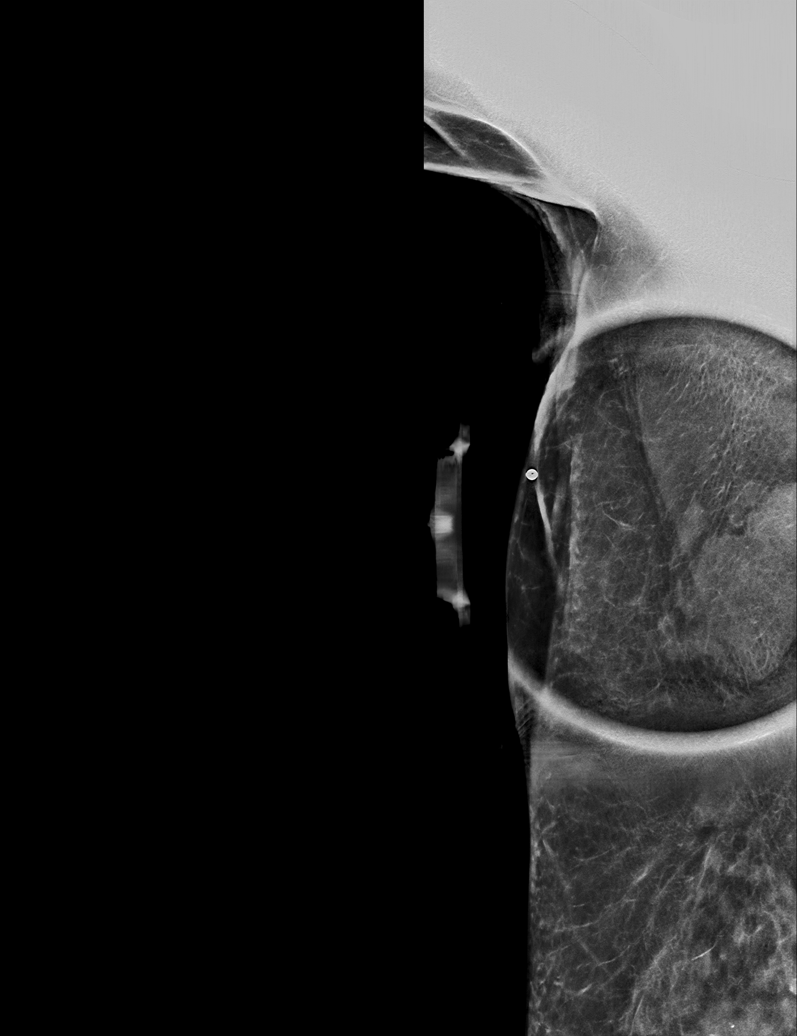

[R CC tomo · tomo slice 39/76.0]
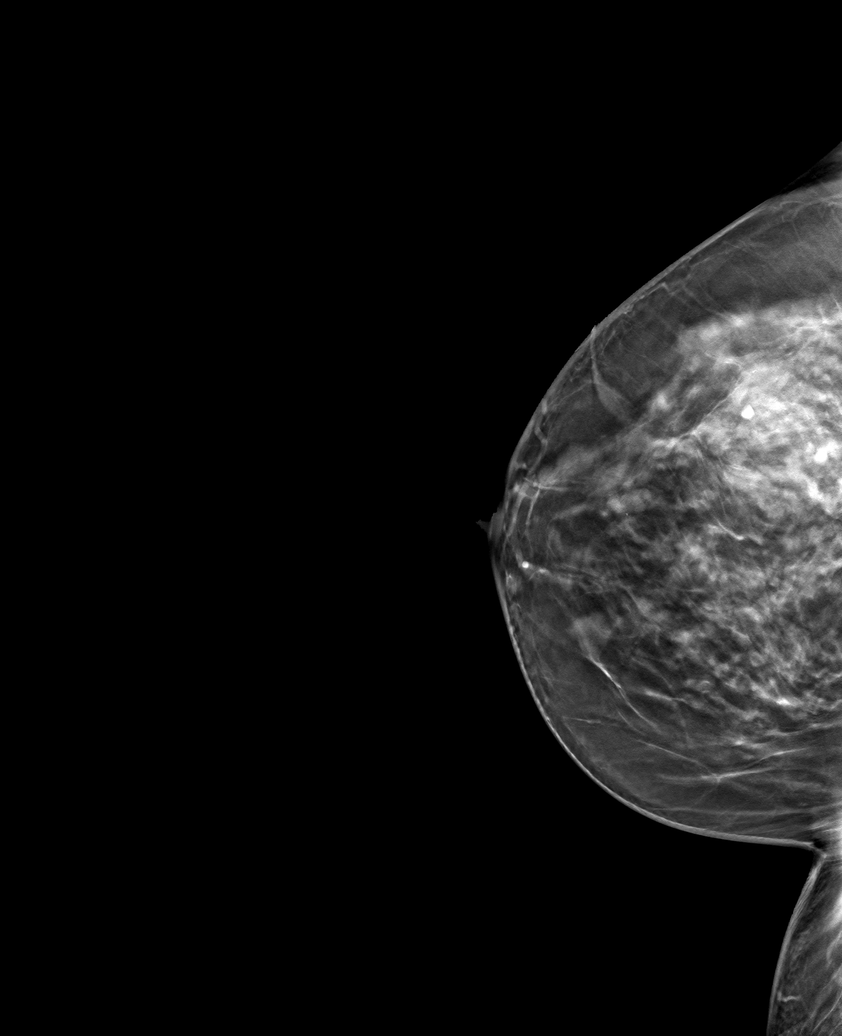

[6 of 30 positions shown; findings below may reference images not displayed]

ACR Breast Density Category c: The breast tissue is heterogeneously
dense, which may obscure small masses.
FINDINGS: A radiopaque BB was placed over the palpable mass in the right
axilla. There is a 4.5 cm nodal mass accounting for the palpable
abnormality. On the lateral view there is persistence of a 8 mm mass
in the upper-outer quadrant. There are also several obscured masses
in the upper-outer quadrant of the breast. There are no malignant
type microcalcifications.

On physical exam, I palpate discrete thickening in the right axilla.

Targeted ultrasound is performed, showing there are 2 enlarged lymph
nodes in the right axilla measuring 3.1 x 1.6 x 2.4 cm and 2.4 x
x 1.6 cm. There is an irregular hypoechoic mass in the right breast
at [DATE] 8 cm from the nipple measuring 1.2 x 0.9 x 1.2 cm. There
are numerous near anechoic cysts in the upper outer quadrant of the
breast. There is a cyst in the right breast at [DATE] 4 cm from the
nipple measuring 1.4 x 0.7 x 1.5 cm. There is a cyst at [DATE] 3 cm
from the nipple measuring 0.7 x 0.8 x 0.7 cm.
IMPRESSION: Suspicious mass in the [DATE] region of the right breast 8 cm from
the nipple and 2 suspicious axillary lymph nodes.

RECOMMENDATION:
Ultrasound-guided core biopsies of the mass in the [DATE] region of
the right breast and a right axillary lymph node is recommended.

If the mass and lymph nodes are positive for invasive mammary
carcinoma and metastatic adenopathy further evaluation of the breast
with MRI would be recommended given the patient's dense
fibroglandular pattern and multiple cysts.

I have discussed the findings and recommendations with the patient.
If applicable, a reminder letter will be sent to the patient
regarding the next appointment.

BI-RADS CATEGORY  5: Highly suggestive of malignancy.

## 2020-10-14 IMAGING — US US BREAST*R* LIMITED INC AXILLA
1 series · 13 of 24 positions shown · non-contrast
Comparison: Previous exam(s).

CLINICAL DATA: Patient complains of a palpable right breast mass.
History of excisional biopsy in the right breast in [7C].

EXAM:
DIGITAL DIAGNOSTIC UNILATERAL RIGHT MAMMOGRAM WITH TOMOSYNTHESIS AND
CAD; ULTRASOUND RIGHT BREAST LIMITED
TECHNIQUE: Right digital diagnostic mammography and breast tomosynthesis was
performed. The images were evaluated with computer-aided detection.;
Targeted ultrasound examination of the right breast was performed

[Series 1: us breast*right* limited inc axilla · 0.07mm/px · 13 of 24 slices shown]
[im 1/24]
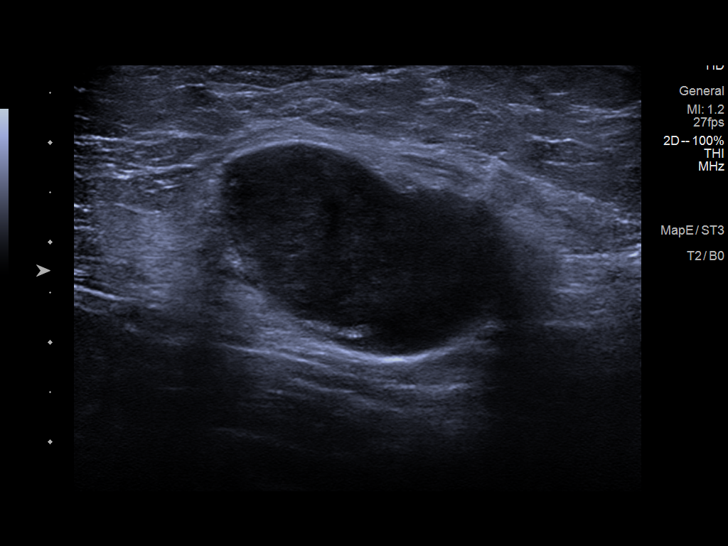
[im 3/24]
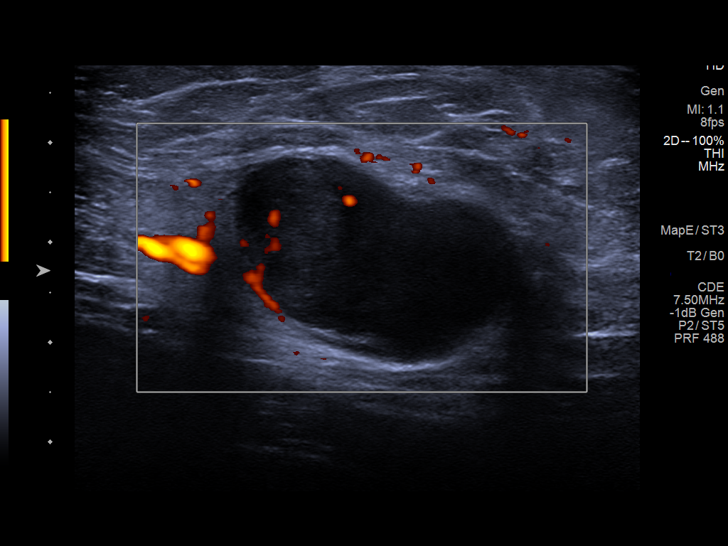
[im 5/24]
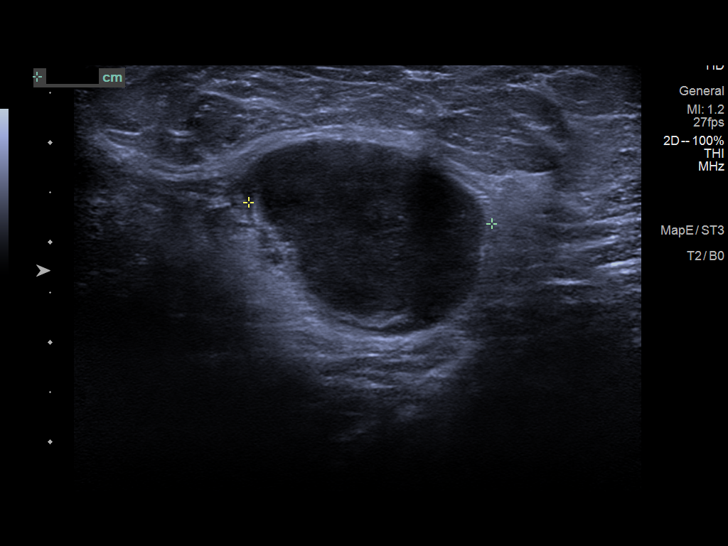
[im 7/24]
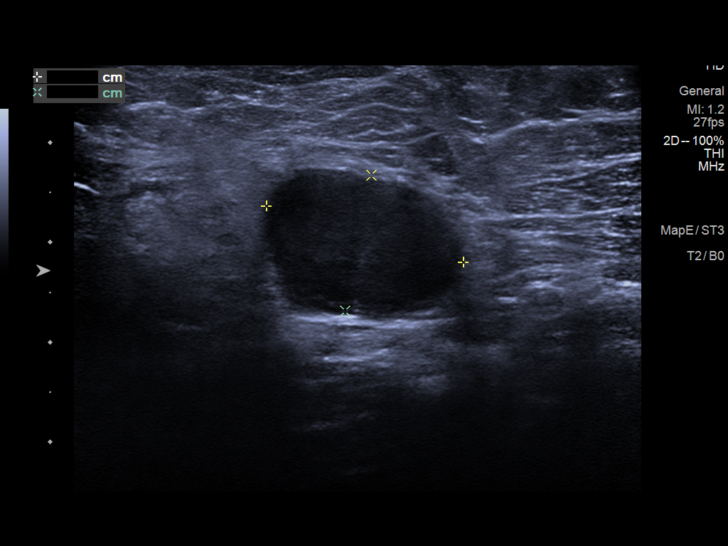
[im 9/24]
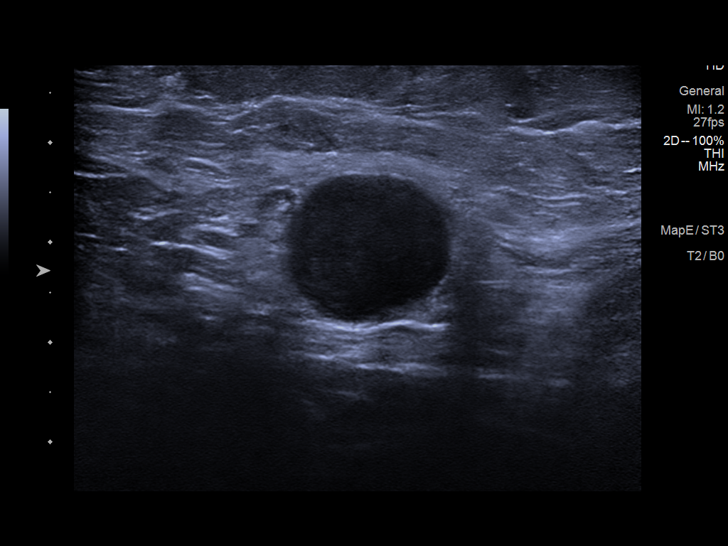
[im 11/24]
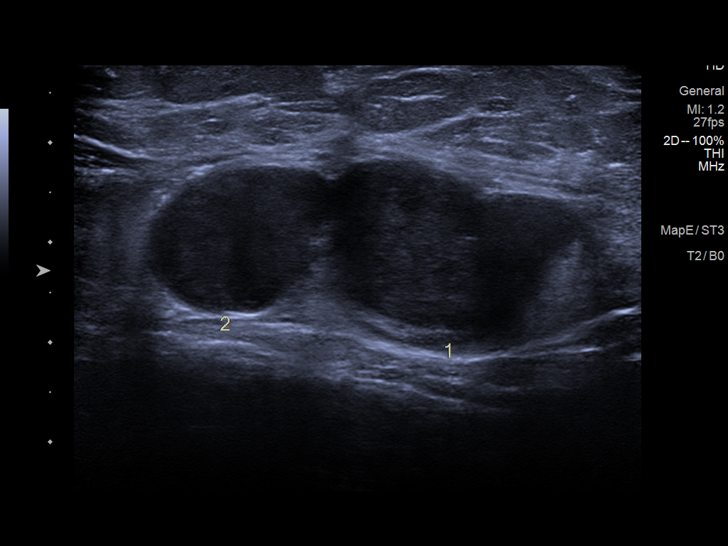
[im 13/24]
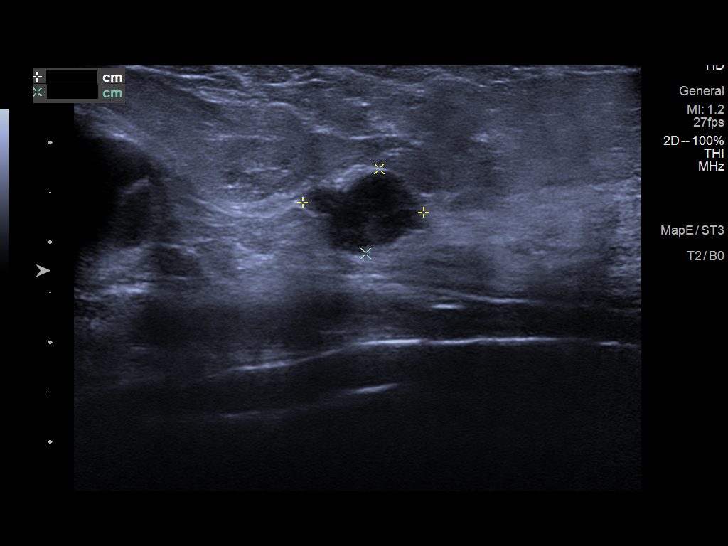
[im 14/24]
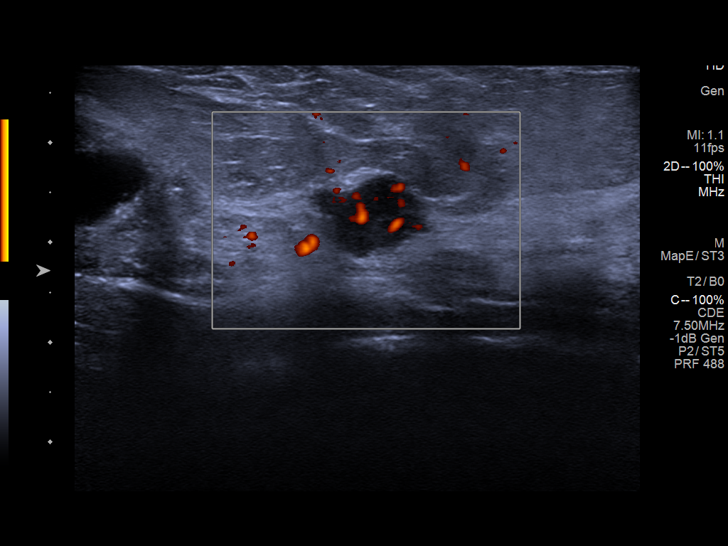
[im 16/24]
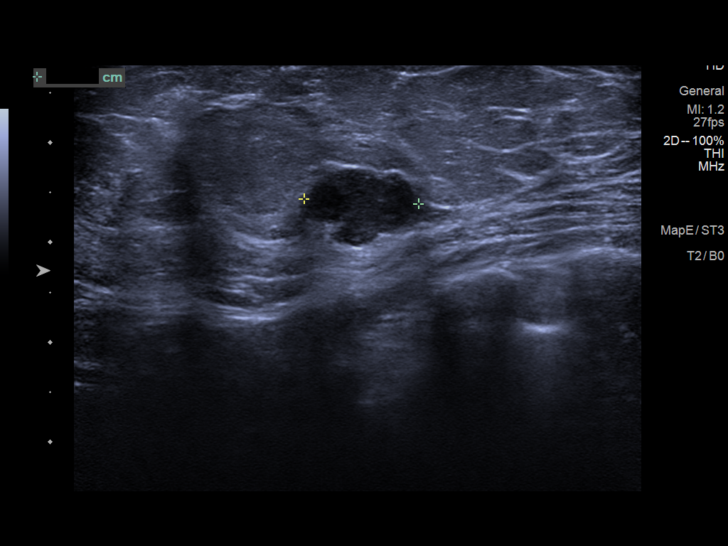
[im 18/24]
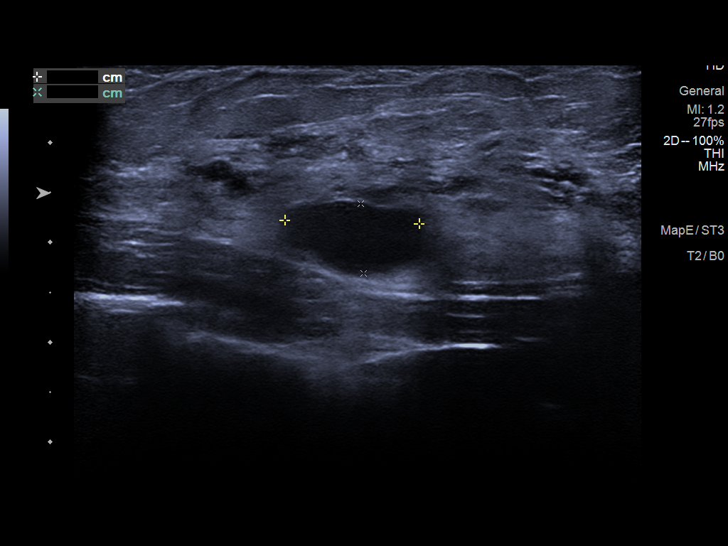
[im 20/24]
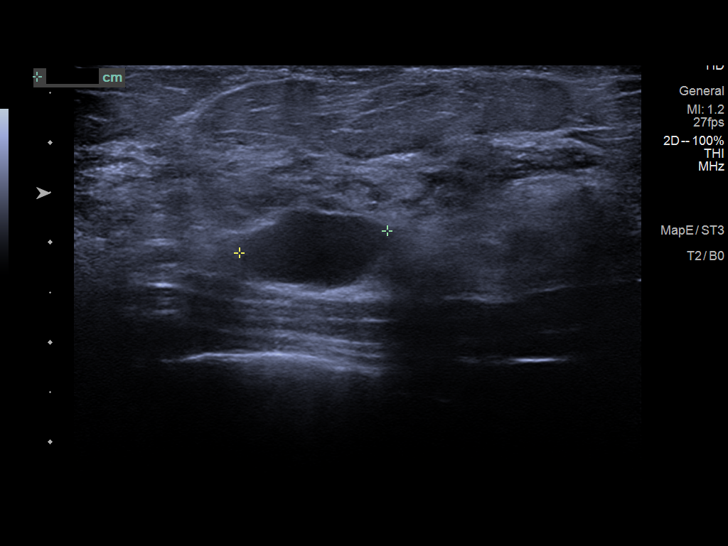
[im 22/24]
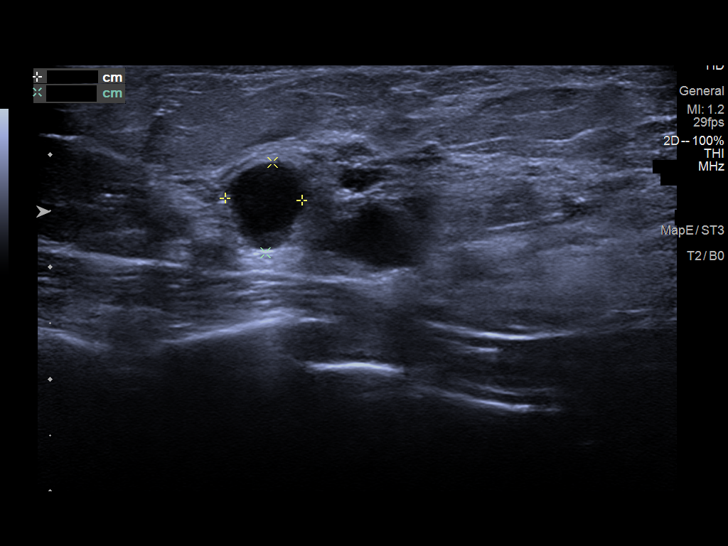
[im 24/24]
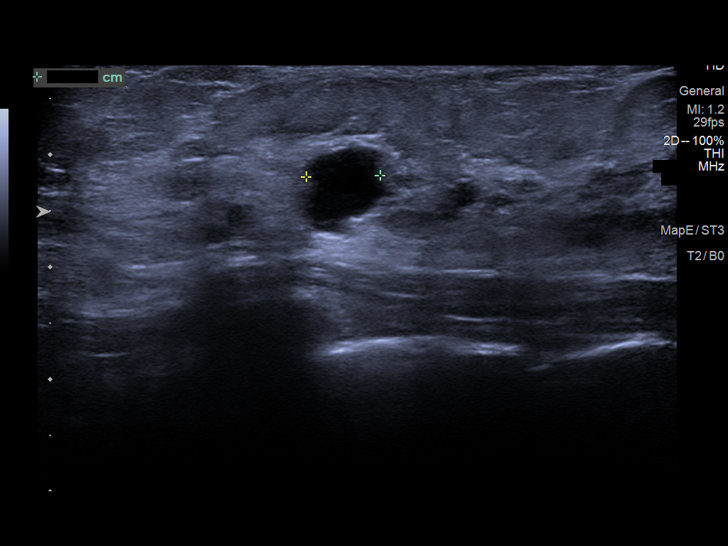

[13 of 24 positions shown; findings below may reference images not displayed]

ACR Breast Density Category c: The breast tissue is heterogeneously
dense, which may obscure small masses.
FINDINGS: A radiopaque BB was placed over the palpable mass in the right
axilla. There is a 4.5 cm nodal mass accounting for the palpable
abnormality. On the lateral view there is persistence of a 8 mm mass
in the upper-outer quadrant. There are also several obscured masses
in the upper-outer quadrant of the breast. There are no malignant
type microcalcifications.

On physical exam, I palpate discrete thickening in the right axilla.

Targeted ultrasound is performed, showing there are 2 enlarged lymph
nodes in the right axilla measuring 3.1 x 1.6 x 2.4 cm and 2.4 x
x 1.6 cm. There is an irregular hypoechoic mass in the right breast
at [DATE] 8 cm from the nipple measuring 1.2 x 0.9 x 1.2 cm. There
are numerous near anechoic cysts in the upper outer quadrant of the
breast. There is a cyst in the right breast at [DATE] 4 cm from the
nipple measuring 1.4 x 0.7 x 1.5 cm. There is a cyst at [DATE] 3 cm
from the nipple measuring 0.7 x 0.8 x 0.7 cm.
IMPRESSION: Suspicious mass in the [DATE] region of the right breast 8 cm from
the nipple and 2 suspicious axillary lymph nodes.

RECOMMENDATION:
Ultrasound-guided core biopsies of the mass in the [DATE] region of
the right breast and a right axillary lymph node is recommended.

If the mass and lymph nodes are positive for invasive mammary
carcinoma and metastatic adenopathy further evaluation of the breast
with MRI would be recommended given the patient's dense
fibroglandular pattern and multiple cysts.

I have discussed the findings and recommendations with the patient.
If applicable, a reminder letter will be sent to the patient
regarding the next appointment.

BI-RADS CATEGORY  5: Highly suggestive of malignancy.

## 2020-10-15 ENCOUNTER — Other Ambulatory Visit: Payer: Self-pay | Admitting: Obstetrics and Gynecology

## 2020-10-15 ENCOUNTER — Encounter: Payer: Self-pay | Admitting: Obstetrics and Gynecology

## 2020-10-15 DIAGNOSIS — R928 Other abnormal and inconclusive findings on diagnostic imaging of breast: Secondary | ICD-10-CM

## 2020-10-15 DIAGNOSIS — N6310 Unspecified lump in the right breast, unspecified quadrant: Secondary | ICD-10-CM

## 2020-10-15 DIAGNOSIS — R2231 Localized swelling, mass and lump, right upper limb: Secondary | ICD-10-CM

## 2020-10-17 ENCOUNTER — Encounter: Payer: Self-pay | Admitting: Obstetrics and Gynecology

## 2020-10-17 ENCOUNTER — Other Ambulatory Visit: Payer: Self-pay | Admitting: Obstetrics and Gynecology

## 2020-10-17 DIAGNOSIS — M79622 Pain in left upper arm: Secondary | ICD-10-CM

## 2020-10-17 NOTE — Telephone Encounter (Signed)
Spoke with pt. Recently dx with Cat 5 mammo RT breast with RT axillary mass. Having LT axillary pain now, concerned about sx. Spoke with Aldona Bar at [facility] sched. Pt getting RT breast bx tomorrow, will do LT axillary u/s at appt.  Pt also with pain in pelvic bones/bilat iliac crest per description since Mon, affecting her sleep. Usually no pain during day but having it now.  If bx is positive for breast cancer, then concerned about mets.  Discussed option right now would be xray, but that would prob be neg. PET scan is best imaging but can't order without cancer dx. Offered xray today but pt prefers to hold off. Will wait for bx results next wk. If cancer, will order PET scan while pt waiting to get in with oncology. Pt agrees to plan.

## 2020-10-18 ENCOUNTER — Other Ambulatory Visit: Payer: Self-pay

## 2020-10-18 ENCOUNTER — Ambulatory Visit
Admission: RE | Admit: 2020-10-18 | Discharge: 2020-10-18 | Disposition: A | Discharge status: Home / Self Care | Payer: 59 | Source: Ambulatory Visit | Attending: Obstetrics and Gynecology | Admitting: Obstetrics and Gynecology

## 2020-10-18 DIAGNOSIS — N6310 Unspecified lump in the right breast, unspecified quadrant: Secondary | ICD-10-CM | POA: Diagnosis not present

## 2020-10-18 DIAGNOSIS — R928 Other abnormal and inconclusive findings on diagnostic imaging of breast: Secondary | ICD-10-CM

## 2020-10-18 DIAGNOSIS — R590 Localized enlarged lymph nodes: Secondary | ICD-10-CM | POA: Diagnosis not present

## 2020-10-18 DIAGNOSIS — M79622 Pain in left upper arm: Secondary | ICD-10-CM | POA: Insufficient documentation

## 2020-10-18 DIAGNOSIS — C50411 Malignant neoplasm of upper-outer quadrant of right female breast: Secondary | ICD-10-CM | POA: Diagnosis not present

## 2020-10-18 DIAGNOSIS — N6489 Other specified disorders of breast: Secondary | ICD-10-CM | POA: Diagnosis not present

## 2020-10-18 DIAGNOSIS — R2231 Localized swelling, mass and lump, right upper limb: Secondary | ICD-10-CM | POA: Diagnosis not present

## 2020-10-18 DIAGNOSIS — N6311 Unspecified lump in the right breast, upper outer quadrant: Secondary | ICD-10-CM | POA: Diagnosis not present

## 2020-10-18 IMAGING — US US BREAST*L* LIMITED INC AXILLA
1 series · 4 of 4 positions shown · non-contrast
Comparison: Previous exam(s).

CLINICAL DATA: Patient presents today for ultrasound-guided core
needle biopsy of a right breast mass and 1 of 2 abnormal right
axillary lymph nodes. She has developed left axillary tenderness,
the current study is to assess for left axillary pathology.

EXAM:
ULTRASOUND OF THE LEFT AXILLA

[Series 1: us breast*left* limited inc axilla · 0.06mm/px · 4 of 4 slices shown]
[im 1/4]
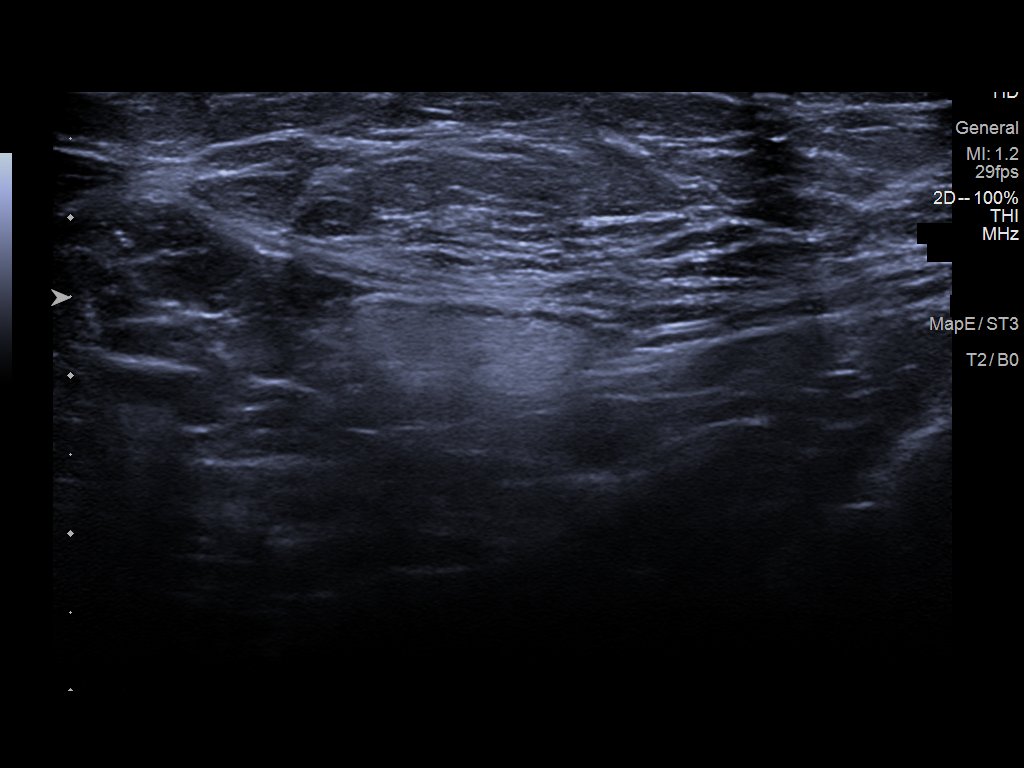
[im 2/4]
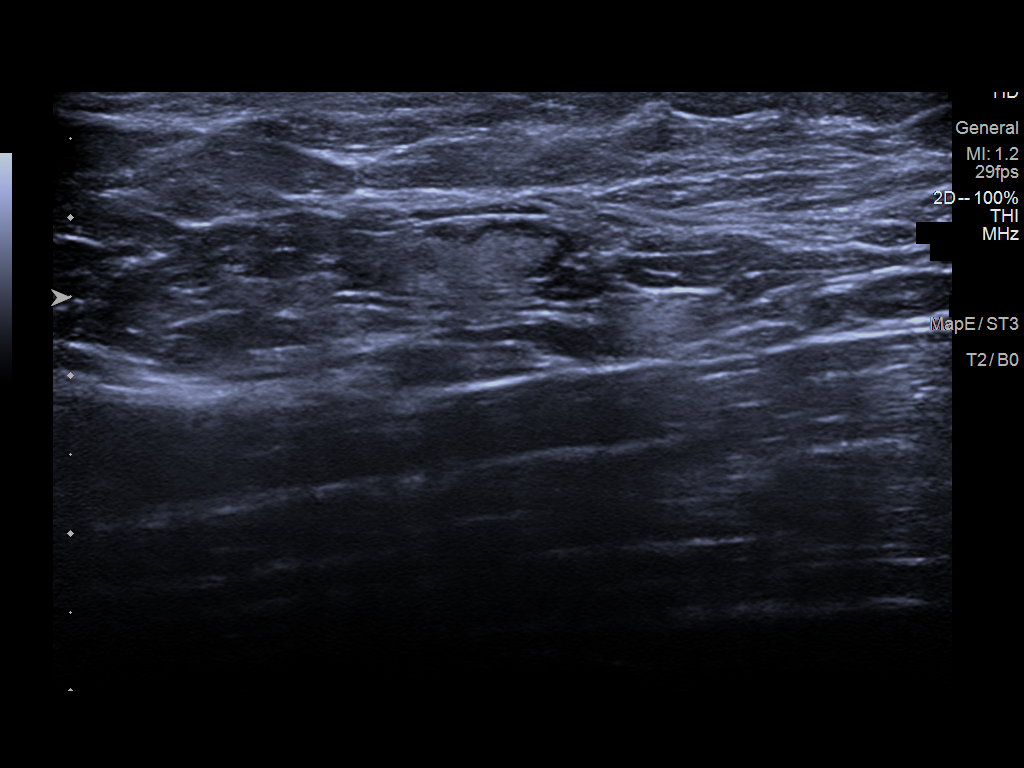
[im 3/4]
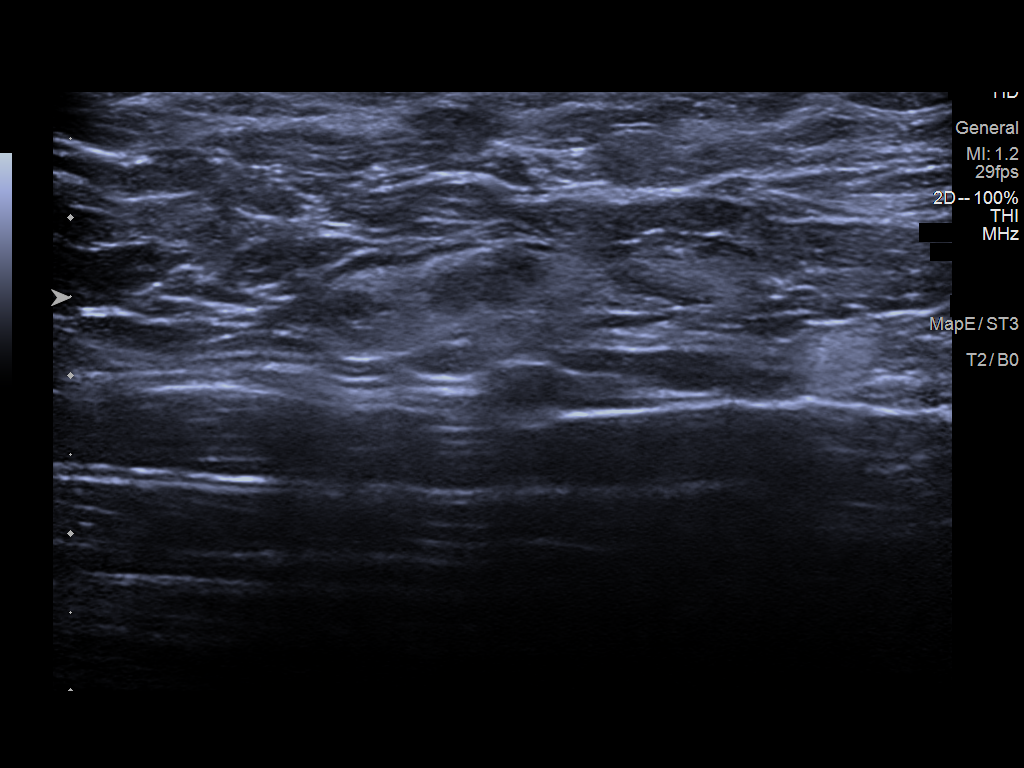
[im 4/4]
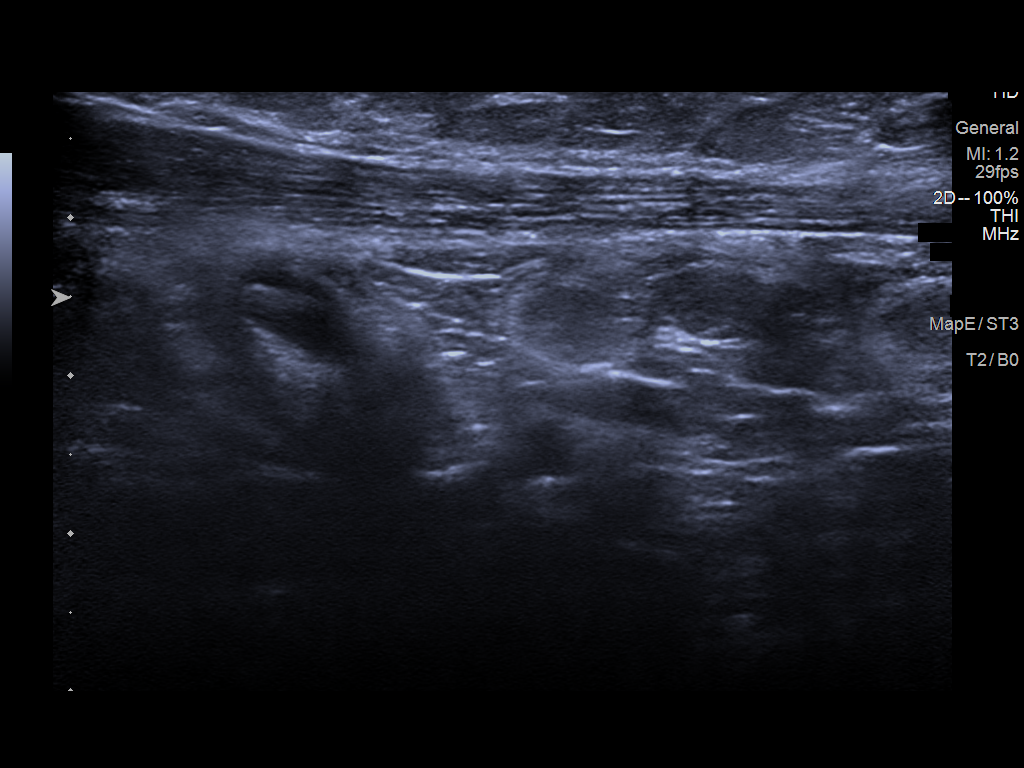

[4 of 4 positions shown; findings below may reference images not displayed]

FINDINGS: On physical exam, no left axillary mass is palpated.

Targeted ultrasound is performed, showing normal left axillary lymph
nodes. No enlarged or abnormal lymph nodes. No masses.
IMPRESSION: Negative exam. No left axillary mass or enlarged or abnormal lymph
node.

RECOMMENDATION:
Biopsy as planned for the known right breast mass and abnormal right
axillary lymph nodes.

I have discussed the findings and recommendations with the patient.
If applicable, a reminder letter will be sent to the patient
regarding the next appointment.

BI-RADS CATEGORY  5: Highly suggestive of malignancy.

BI-RADS category is for right breast mass and abnormal right
axillary lymph nodes.

## 2020-10-18 IMAGING — MG US  BREAST BX W/ LOC DEV 1ST LESION IMG BX SPEC US GUIDE*R*
1 series · 8 of 8 positions shown · non-contrast
Comparison: Previous exam(s).
COMPARISON: Previous exam(s).

Addendum:
CLINICAL DATA: Patient presents for ultrasound-guided core needle
biopsy of a right breast mass and an abnormal right axillary lymph
node.

EXAM:
ULTRASOUND GUIDED RIGHT BREAST CORE NEEDLE BIOPSY
ULTRASOUND GUIDED RIGHT AXILLARY LYMPH NODE CORE NEEDLE BIOPSY

[Series 1: MG view · 0.06mm/px · 8 of 31 slices shown]
[im 1/31]
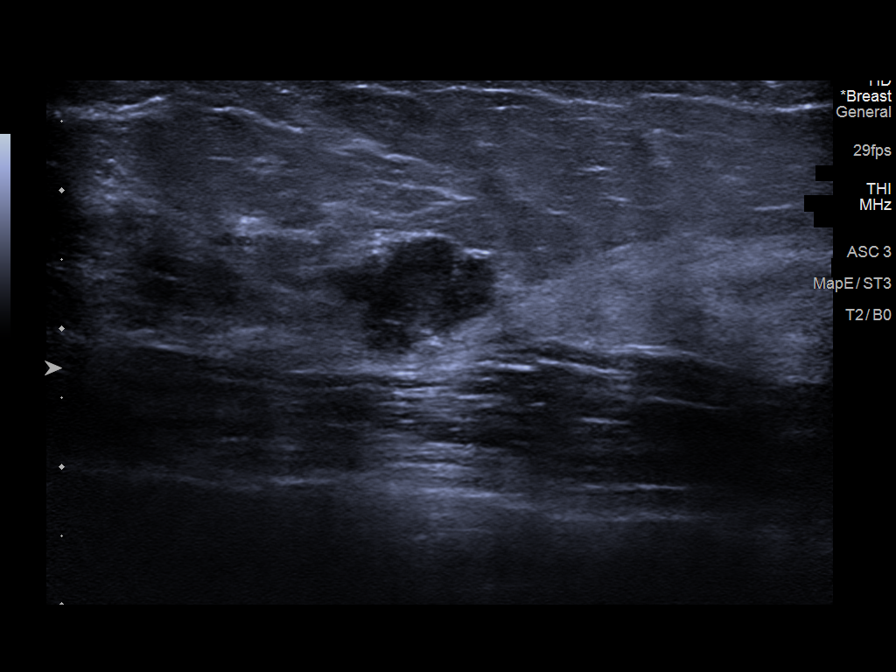
[im 5/31]
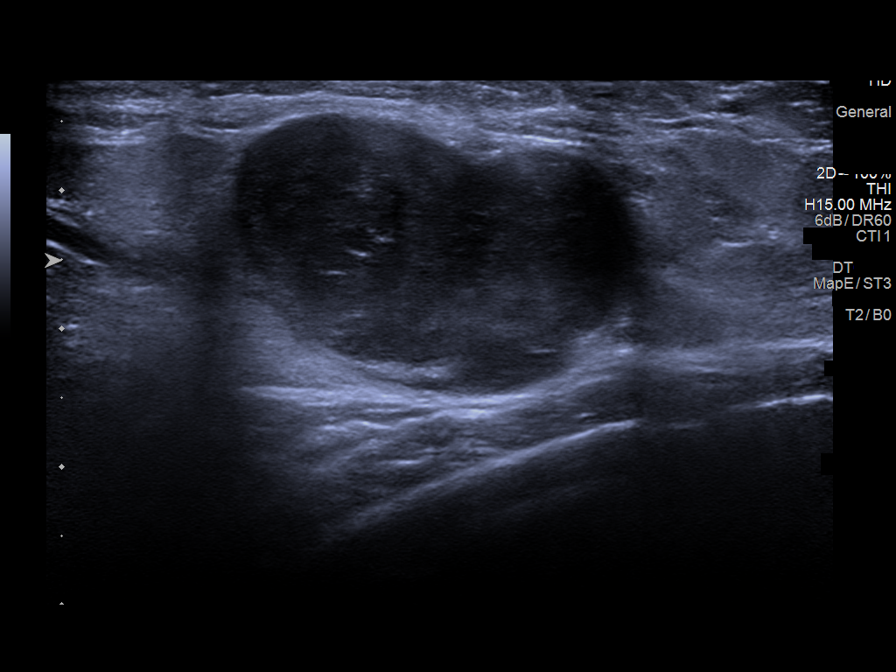
[im 9/31]
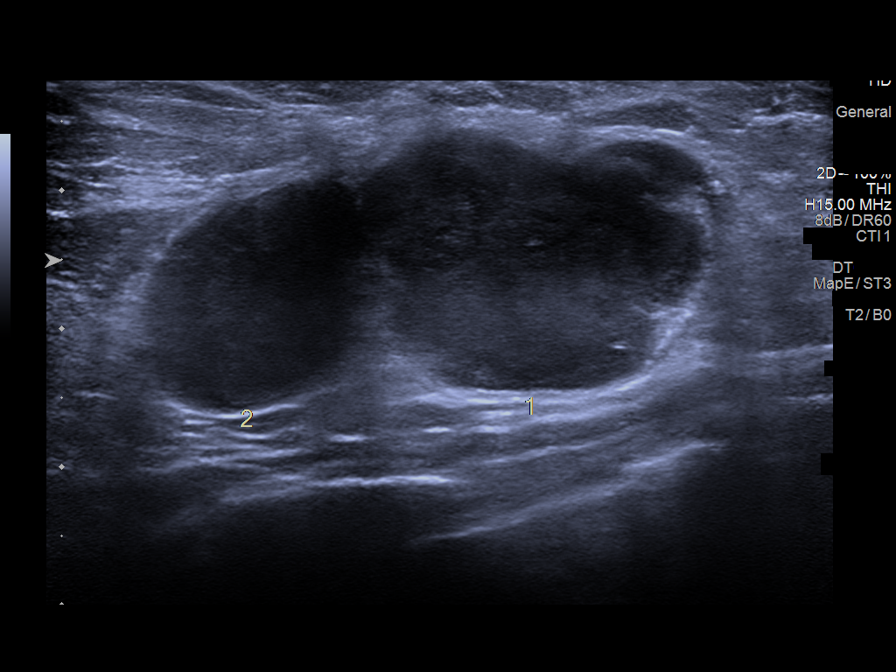
[im 13/31]
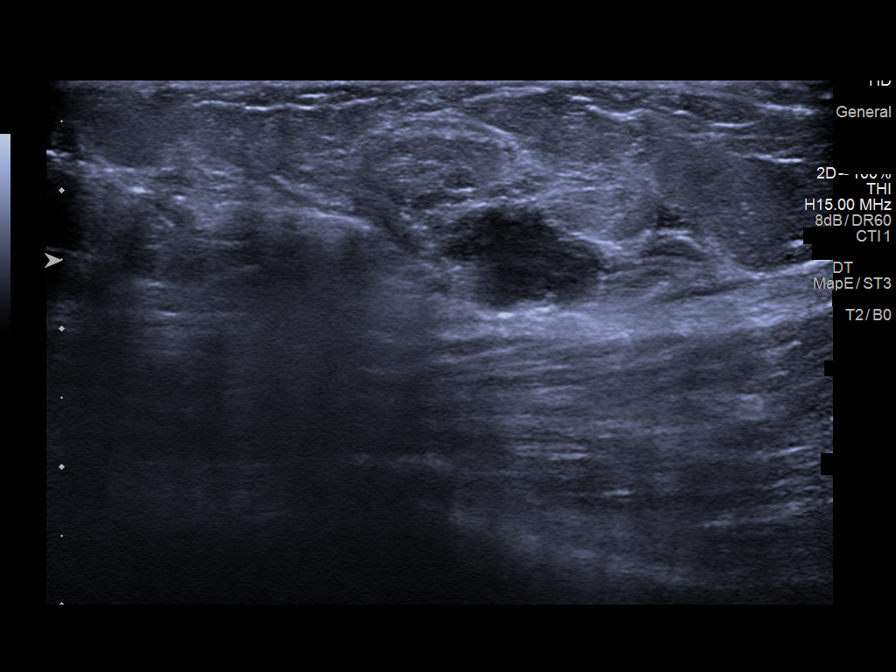
[im 18/31]
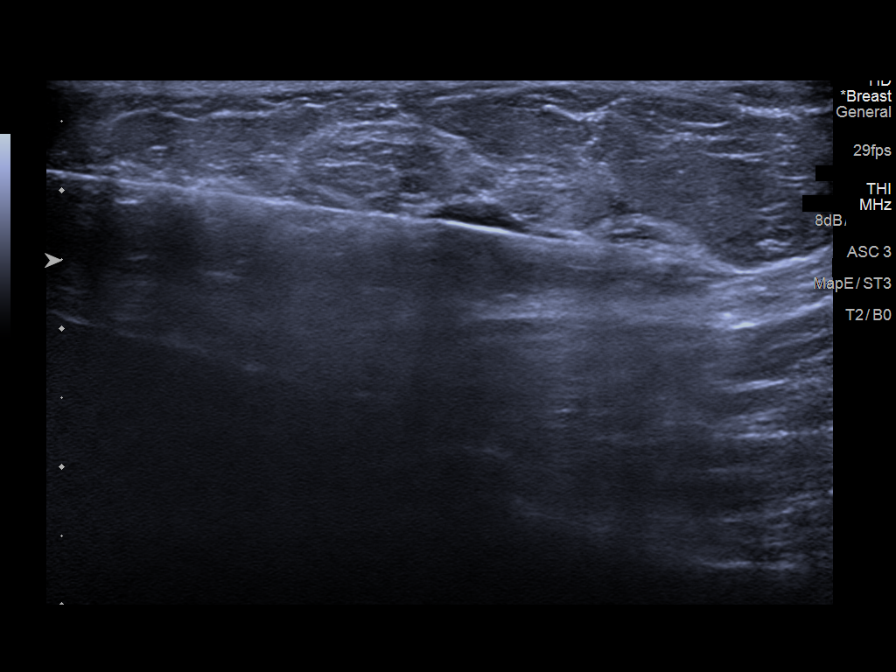
[im 22/31]
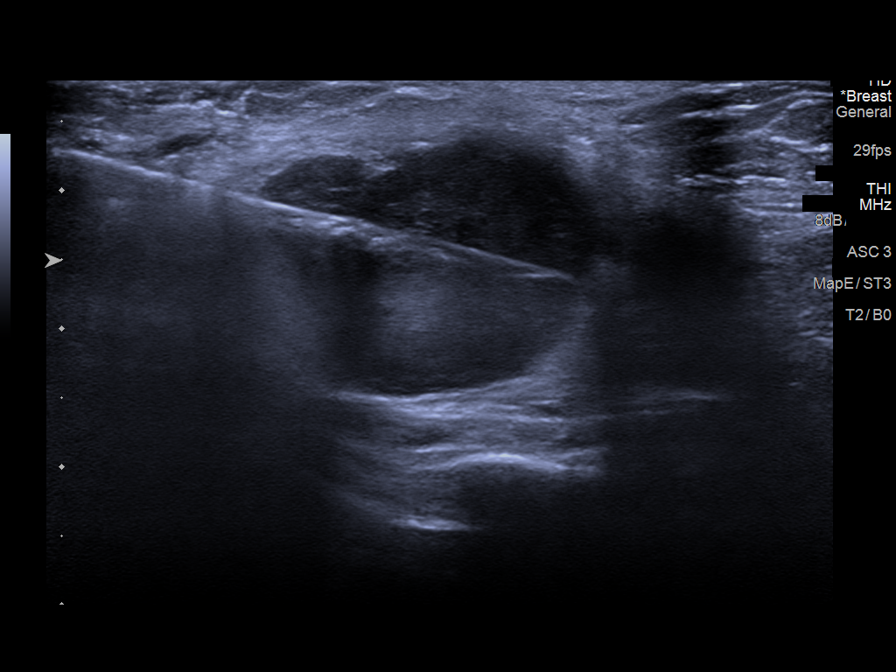
[im 26/31]
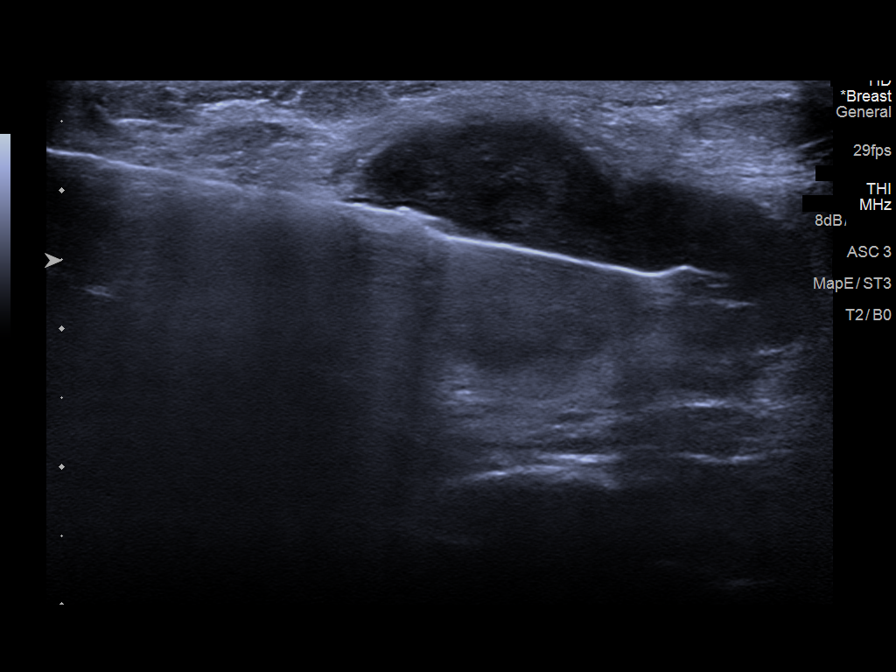
[im 31/31]
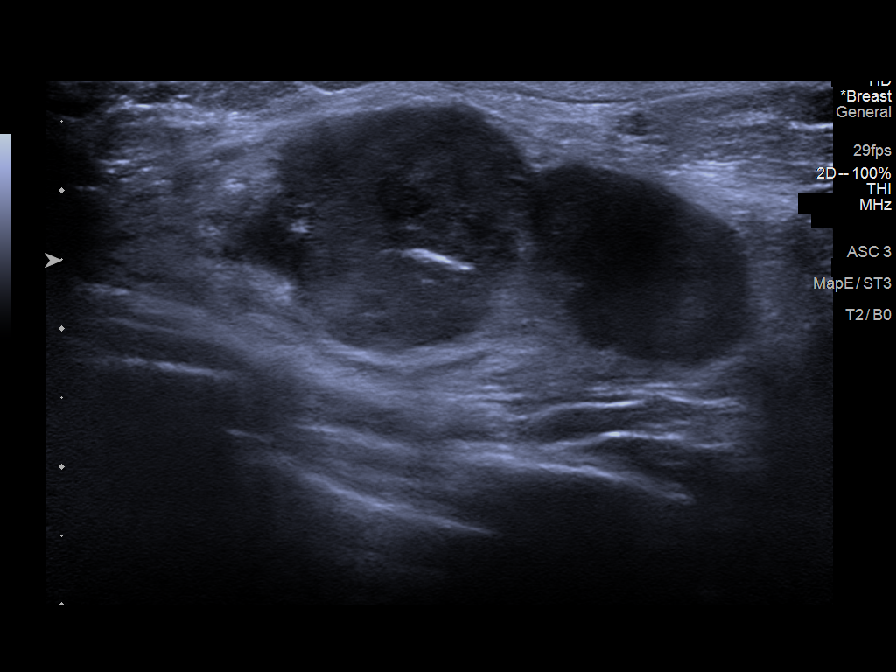

[8 of 8 positions shown; findings below may reference images not displayed]



Lesion #1: 10:30 o'clock position, 1.2 cm, right breast mass.

Lesion quadrant: Upper outer quadrant

Using sterile technique and 1% Lidocaine as local anesthetic, under
direct ultrasound visualization, a 12 gauge ERXLEBEN device was
used to perform biopsy of the 1.2 cm upper-outer quadrant mass using
a inferolateral approach. At the conclusion of the procedure ribbon
shaped tissue marker clip was deployed into the biopsy cavity.

Lesion #2: Abnormal right axillary lymph node.

Lesion location: Right axilla

Using sterile technique and 1% Lidocaine as local anesthetic, under
direct ultrasound visualization, a 14 gauge ERXLEBEN device was
used to perform biopsy of the largest of the 2 adjacent abnormal
right axillary lymph nodes using an inferior approach. At the
conclusion of the procedure a HydroMARK tissue marker clip was
deployed into the biopsy cavity.

Follow up 2 view mammogram was performed and dictated separately.
IMPRESSION: Ultrasound guided biopsy of a right breast mass and 1 of 2 abnormal
right axillary lymph nodes. No apparent complications.

ADDENDUM:
PATHOLOGY revealed: Site A. BREAST, RIGHT AT [OU] O'CLOCK, 8 CM FROM
NIPPLE; ULTRASOUND-GUIDED CORE NEEDLE BIOPSY: - INVASIVE MAMMARY
CARCINOMA, NO SPECIAL TYPE. Size of invasive carcinoma: 10 mm in
this sample. Grade 3. Ductal carcinoma in situ: Not identified.
Lymphovascular invasion: Focally suspicious.

Pathology results are CONCORDANT with imaging findings, per Dr.
ERXLEBEN.

PATHOLOGY revealed: Site B. LYMPH NODES, RIGHT AXILLARY;
ULTRASOUND-GUIDED CORE NEEDLE BIOPSY: - POSITIVE FOR MALIGNANCY. -
METASTATIC MAMMARY CARCINOMA. Comment: Metastatic mammary carcinoma
is present in 4 of 4 sampled tissue cores, measuring up to 16 mm in
greatest linear extent. Although focally increased lymphocytic
collections are present, definitive residual lymph node tissue is
not readily apparent.

Pathology results are CONCORDANT with imaging findings, per Dr.
ERXLEBEN.

Pathology results and recommendations below were discussed with
patient by telephone on [DATE]. Patient reported biopsy site
within normal limits with slight tenderness at the site. Post biopsy
care instructions were reviewed, questions were answered and my
direct phone number was provided to patient. Patient was instructed
to call [HOSPITAL] if any concerns or questions arise
related to the biopsy.

Recommendations: 1. Surgical and oncological consultation. Request
for surgical and oncological consultation relayed to ERXLEBEN RN
and ERXLEBEN RN at [HOSPITAL] [HOSPITAL] by ERXLEBEN
ERXLEBEN RN on [DATE].

2. Consider bilateral breast MRI tissue given patient's age and
heterogeneously dense breast tissue.

Pathology results reported by ERXLEBEN RN on [DATE].



Lesion #1: 10:30 o'clock position, 1.2 cm, right breast mass.

Lesion quadrant: Upper outer quadrant

Using sterile technique and 1% Lidocaine as local anesthetic, under
direct ultrasound visualization, a 12 gauge ERXLEBEN device was
used to perform biopsy of the 1.2 cm upper-outer quadrant mass using
a inferolateral approach. At the conclusion of the procedure ribbon
shaped tissue marker clip was deployed into the biopsy cavity.

Lesion #2: Abnormal right axillary lymph node.

Lesion location: Right axilla

Using sterile technique and 1% Lidocaine as local anesthetic, under
direct ultrasound visualization, a 14 gauge ERXLEBEN device was
used to perform biopsy of the largest of the 2 adjacent abnormal
right axillary lymph nodes using an inferior approach. At the
conclusion of the procedure a HydroMARK tissue marker clip was
deployed into the biopsy cavity.

Follow up 2 view mammogram was performed and dictated separately.
IMPRESSION: Ultrasound guided biopsy of a right breast mass and 1 of 2 abnormal
right axillary lymph nodes. No apparent complications.

## 2020-10-18 IMAGING — MG MM BREAST LOCALIZATION CLIP
6 series · 6 of 18 positions shown · non-contrast
Comparison: Previous exam(s).

CLINICAL DATA: Evaluate post biopsy marker clip placements
following ultrasound-guided core needle biopsy of an upper outer
quadrant right breast mass and 1 of 2 adjacent enlarged/abnormal
right axillary lymph nodes.

EXAM:
3D DIAGNOSTIC RIGHT MAMMOGRAM POST ULTRASOUND BIOPSY

[R CC synth-2D]
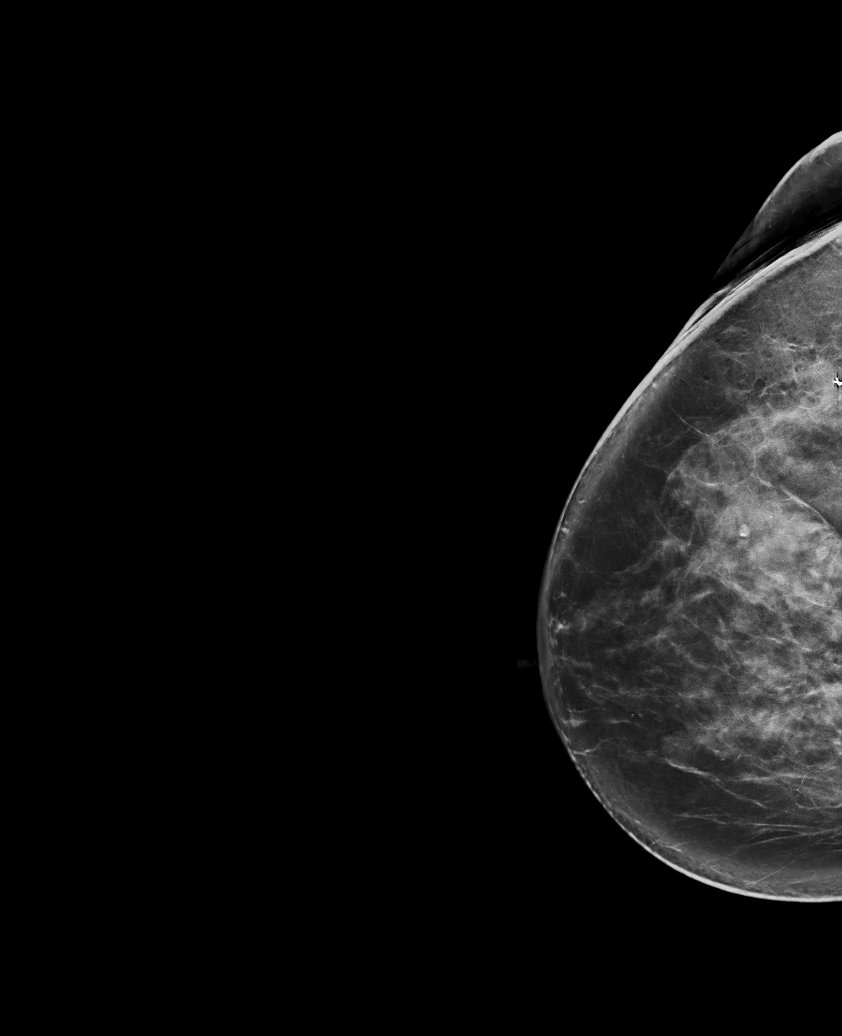

[R MLO synth-2D]
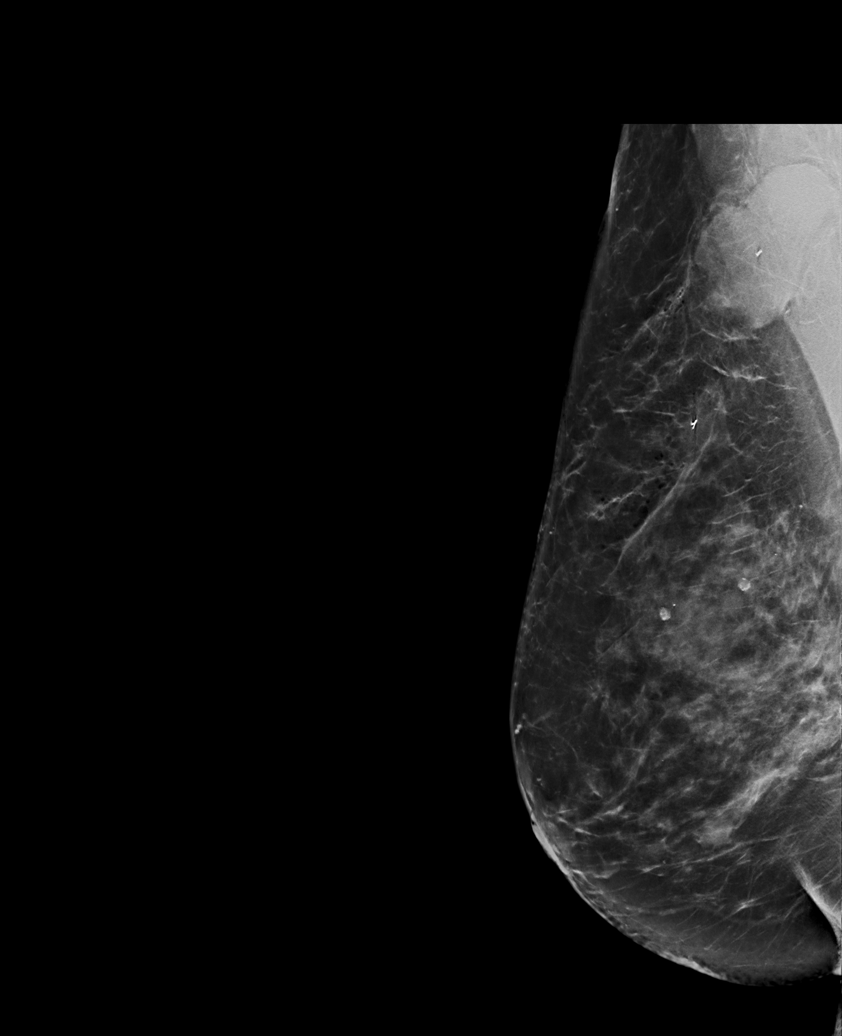

[R ML synth-2D]
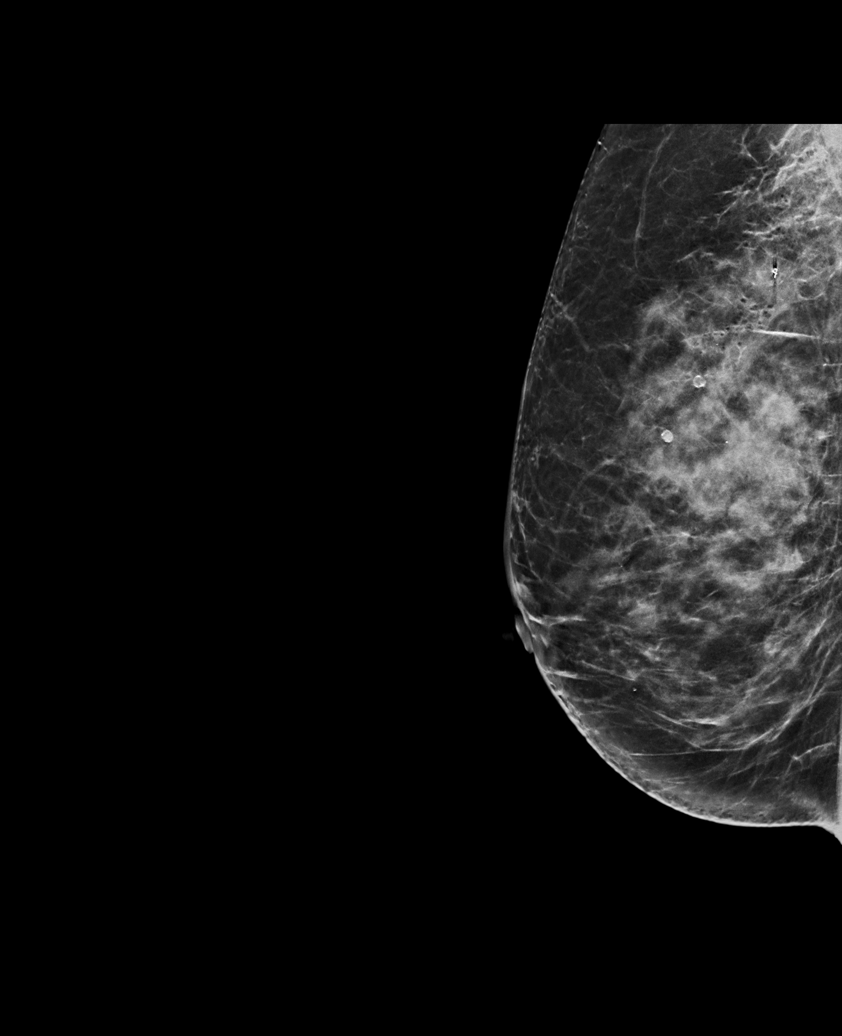

[R ML tomo · tomo slice 38/75.0]
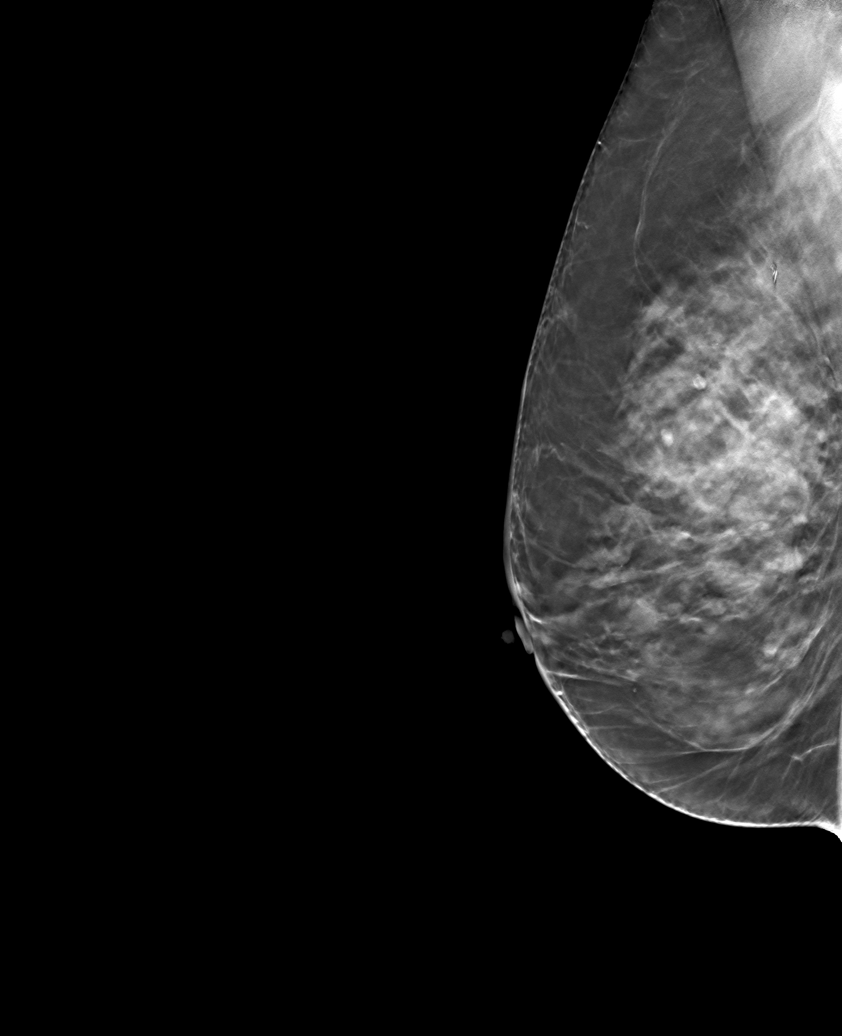

[R CC tomo · tomo slice 45/88.0]
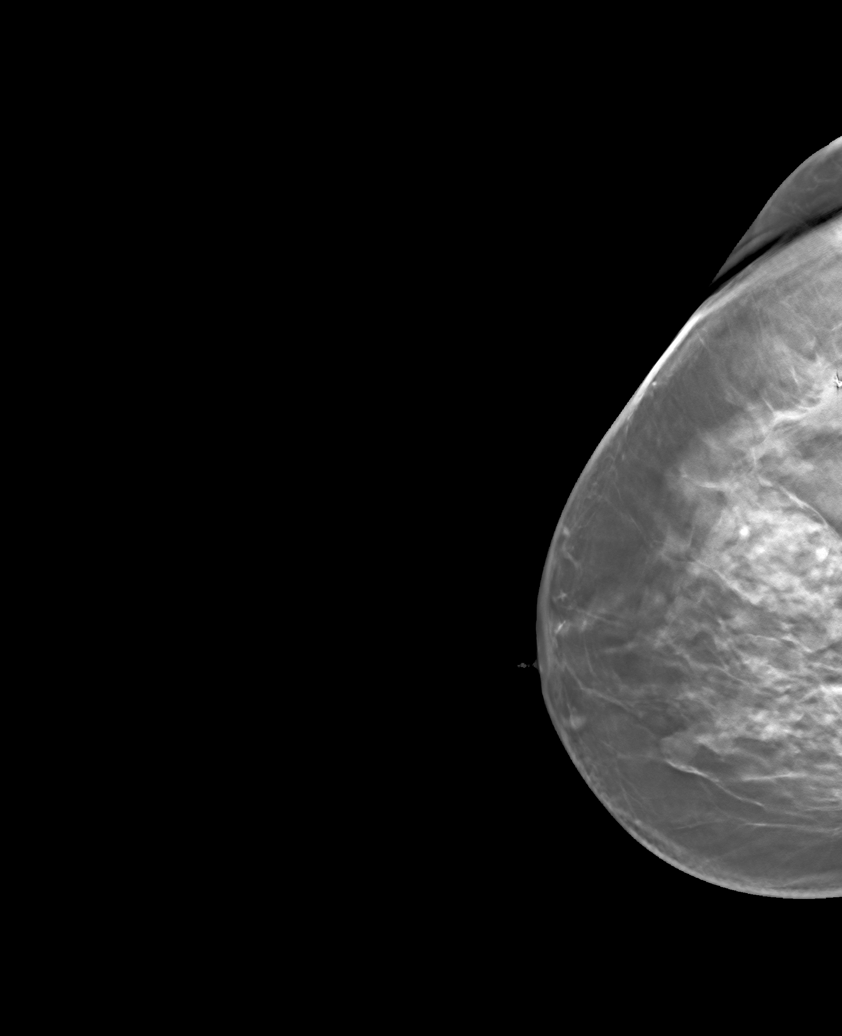

[R MLO tomo · tomo slice 47/93.0]
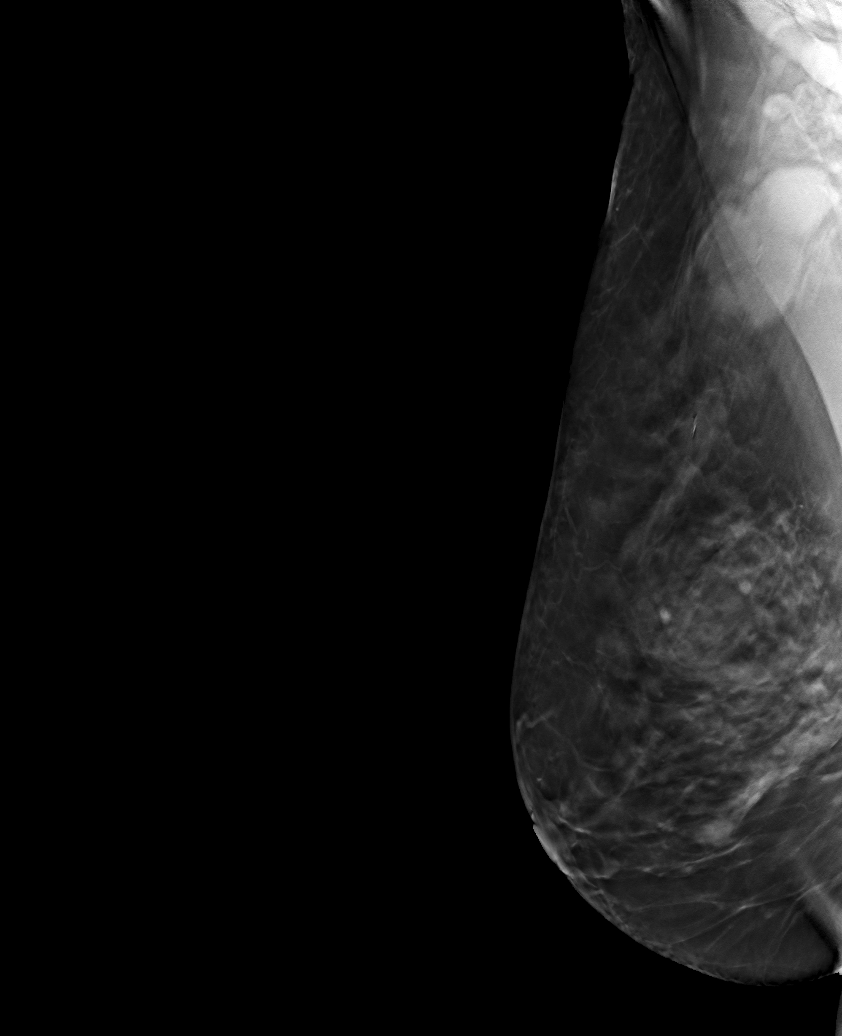

[6 of 18 positions shown; findings below may reference images not displayed]

FINDINGS: 3D Mammographic images were obtained following ultrasound guided
biopsy of an upper outer quadrant right breast mass and 1 of 2
adjacent abnormal right axillary lymph nodes. The ribbon shaped
biopsy clip lies within the upper outer quadrant right breast mass
and the HydroMARK clip lies within the enlarged right axillary lymph
node.
IMPRESSION: Appropriate positioning of the ribbon shaped biopsy marking clip and
the HydroMARK biopsy marking clip at the site of biopsy in the right
breast upper outer quadrant and right axilla respectively.

Final Assessment: Post Procedure Mammograms for Marker Placement

## 2020-10-21 DIAGNOSIS — C50919 Malignant neoplasm of unspecified site of unspecified female breast: Secondary | ICD-10-CM

## 2020-10-21 NOTE — Progress Notes (Signed)
Navigation initiated.  Scheduled initial Med?onc and surgical consults.

## 2020-10-21 NOTE — Telephone Encounter (Signed)
Pt aware of metastatic breast cancer on bx. Having bad pelvic bone pain. Waiting to hear from nurse navigator at [facility] re: appts/surgery ref.  Discussed [place] ordering PET scan vs oncology. Will depend on when she can get appt. Pt to f/u with me as appt info becomes available.

## 2020-10-22 ENCOUNTER — Inpatient Hospital Stay: Payer: 59 | Attending: Oncology | Admitting: Oncology

## 2020-10-22 ENCOUNTER — Inpatient Hospital Stay: Payer: 59

## 2020-10-22 ENCOUNTER — Telehealth: Payer: Self-pay | Admitting: *Deleted

## 2020-10-22 ENCOUNTER — Encounter: Payer: Self-pay | Admitting: Oncology

## 2020-10-22 VITALS — BP 118/80 | HR 73 | Temp 97.6°F | Resp 18 | Wt 130.0 lb

## 2020-10-22 DIAGNOSIS — C50919 Malignant neoplasm of unspecified site of unspecified female breast: Secondary | ICD-10-CM

## 2020-10-22 DIAGNOSIS — Z7189 Other specified counseling: Secondary | ICD-10-CM

## 2020-10-22 DIAGNOSIS — C773 Secondary and unspecified malignant neoplasm of axilla and upper limb lymph nodes: Secondary | ICD-10-CM | POA: Insufficient documentation

## 2020-10-22 DIAGNOSIS — Z803 Family history of malignant neoplasm of breast: Secondary | ICD-10-CM | POA: Diagnosis not present

## 2020-10-22 DIAGNOSIS — Z780 Asymptomatic menopausal state: Secondary | ICD-10-CM | POA: Insufficient documentation

## 2020-10-22 DIAGNOSIS — C50411 Malignant neoplasm of upper-outer quadrant of right female breast: Secondary | ICD-10-CM | POA: Insufficient documentation

## 2020-10-22 DIAGNOSIS — R0789 Other chest pain: Secondary | ICD-10-CM | POA: Diagnosis not present

## 2020-10-22 DIAGNOSIS — C50911 Malignant neoplasm of unspecified site of right female breast: Secondary | ICD-10-CM | POA: Diagnosis not present

## 2020-10-22 LAB — COMPREHENSIVE METABOLIC PANEL
ALT: 14 U/L (ref 0–44)
AST: 17 U/L (ref 15–41)
Albumin: 4.7 g/dL (ref 3.5–5.0)
Alkaline Phosphatase: 72 U/L (ref 38–126)
Anion gap: 7 (ref 5–15)
BUN: 16 mg/dL (ref 8–23)
CO2: 30 mmol/L (ref 22–32)
Calcium: 9.6 mg/dL (ref 8.9–10.3)
Chloride: 101 mmol/L (ref 98–111)
Creatinine, Ser: 0.69 mg/dL (ref 0.44–1.00)
GFR, Estimated: >60 mL/min (ref >60)
Glucose, Bld: 86 mg/dL (ref 70–99)
Potassium: 3.8 mmol/L (ref 3.5–5.1)
Sodium: 138 mmol/L (ref 135–145)
Total Bilirubin: 0.3 mg/dL (ref 0.3–1.2)
Total Protein: 7.7 g/dL (ref 6.5–8.1)

## 2020-10-22 LAB — CBC WITH DIFFERENTIAL/PLATELET
Abs Immature Granulocytes: 0.01 10*3/uL (ref 0.00–0.07)
Basophils Absolute: 0 10*3/uL (ref 0.0–0.1)
Basophils Relative: 0 %
Eosinophils Absolute: 0.2 10*3/uL (ref 0.0–0.5)
Eosinophils Relative: 2 %
HCT: 41.2 % (ref 36.0–46.0)
Hemoglobin: 14.3 g/dL (ref 12.0–15.0)
Immature Granulocytes: 0 %
Lymphocytes Relative: 24 %
Lymphs Abs: 1.6 10*3/uL (ref 0.7–4.0)
MCH: 31.5 pg (ref 26.0–34.0)
MCHC: 34.7 g/dL (ref 30.0–36.0)
MCV: 90.7 fL (ref 80.0–100.0)
Monocytes Absolute: 0.5 10*3/uL (ref 0.1–1.0)
Monocytes Relative: 7 %
Neutro Abs: 4.4 10*3/uL (ref 1.7–7.7)
Neutrophils Relative %: 67 %
Platelets: 242 10*3/uL (ref 150–400)
RBC: 4.54 MIL/uL (ref 3.87–5.11)
RDW: 12.1 % (ref 11.5–15.5)
WBC: 6.6 10*3/uL (ref 4.0–10.5)
nRBC: 0 % (ref 0.0–0.2)

## 2020-10-22 NOTE — Telephone Encounter (Signed)
I got a call from our scheduler that patient wanted to know how she does the MRI tomorrow morning at 8 AM.  [facility] her appointments at 8 AM and she will be going in the MRI face ,down head first.  He will be listening to music and it takes approximately 30 minutes to get the MRI.  I also called the human resources for [place] and they will let me know that the patient will need to call matrix and then once they call matrix they get started with the FMLA and then matrix will send the doctor's office a FMLA form to be done.  I gave her the telephone number to matrix as well as the telephone number to benefits.  The benefits person that I spoke to says that it is worth calling them first before they do matrix so that they could go over all the details of benefits that she gets.  I also gave the number to the patient for benefits also.

## 2020-10-22 NOTE — Progress Notes (Signed)
Patient here for oncology follow-up appointment,  concerns of soreness underarm and nausea

## 2020-10-23 ENCOUNTER — Ambulatory Visit
Admission: RE | Admit: 2020-10-23 | Discharge: 2020-10-23 | Disposition: A | Discharge status: Home / Self Care | Payer: 59 | Source: Ambulatory Visit | Attending: Oncology | Admitting: Oncology

## 2020-10-23 DIAGNOSIS — C50919 Malignant neoplasm of unspecified site of unspecified female breast: Secondary | ICD-10-CM | POA: Diagnosis not present

## 2020-10-23 DIAGNOSIS — C50911 Malignant neoplasm of unspecified site of right female breast: Secondary | ICD-10-CM | POA: Diagnosis not present

## 2020-10-23 IMAGING — MR MR BREAST BILAT WO/W CM
2 of 9 series · 7 of 48 positions shown · IV contrast (gadavist)
Comparison: Prior mammograms and ultrasounds

CLINICAL DATA: 60-year-old female with newly diagnosed RIGHT breast
cancer and lymph node metastasis.

LABS:  Not applicable
EXAM:
BILATERAL BREAST MRI WITH AND WITHOUT CONTRAST
TECHNIQUE: Multiplanar, multisequence MR images of both breasts were obtained
prior to and following the intravenous administration of 5 ml of
Gadavist

[Series 2: T1 · axial · B · 1.5mm · 1.02mm/px · z∈[-52,+114]mm · 5 of 112 slices shown]
[im 1/112]
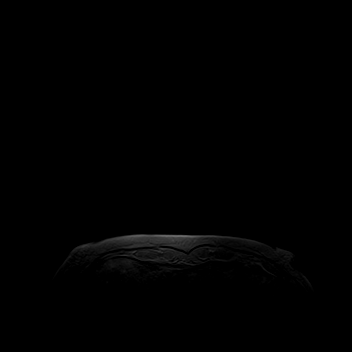
[im 28/112]
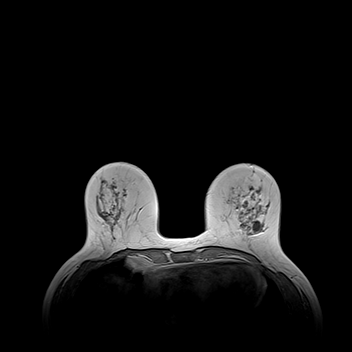
[im 56/112]
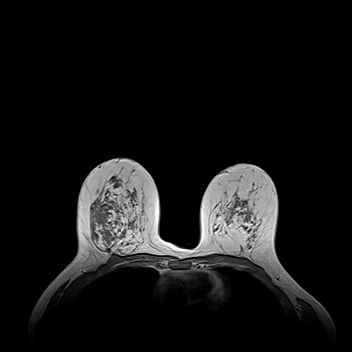
[im 84/112]
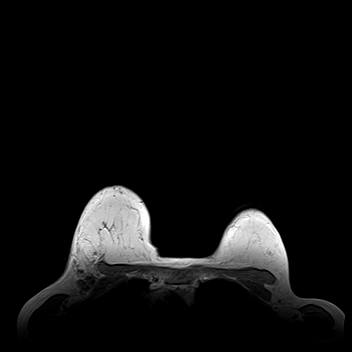
[im 112/112]
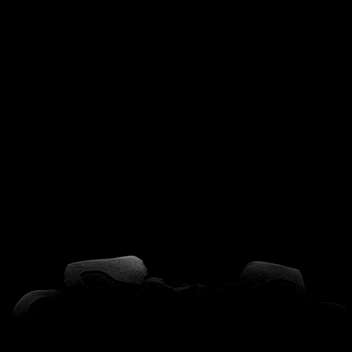

[Series 5: T2 · axial · B · 3.0mm · 1.02mm/px · z∈[-50,+112]mm · 2 of 45 slices shown]
[im 1/45]
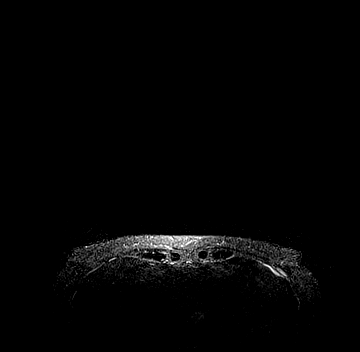
[im 45/45]
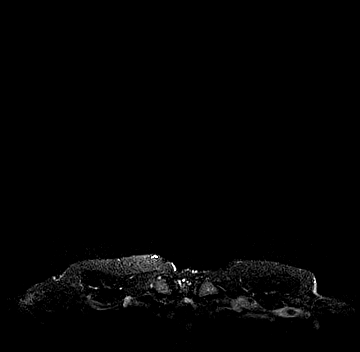

[7 of 48 positions shown; findings below may reference images not displayed]

Three-dimensional MR images were rendered by post-processing of the
original MR data on an independent workstation. The
three-dimensional MR images were interpreted, and findings are
reported in the following complete MRI report for this study. Three
dimensional images were evaluated at the independent interpreting
workstation using the DynaCAD thin client.
FINDINGS: Breast composition: c. Heterogeneous fibroglandular tissue.

Background parenchymal enhancement: Moderate.

Right breast: A 1.2 x 1.2 cm irregular enhancing mass containing
biopsy clip artifact is identified within the posterior UPPER-OUTER
RIGHT breast (series 7: Image 72), compatible with biopsy-proven
malignancy.

No other suspicious areas are noted.

Multiple cysts and scattered foci within the RIGHT breast are
identified.

Left breast: No suspicious mass or enhancement. Multiple cysts and
scattered foci are present.

Lymph nodes: 2 adjacent abnormal enlarged RIGHT axillary lymph nodes
are noted, 1 containing biopsy clip artifact.

Ancillary findings:  None.
IMPRESSION: 1. 1.2 cm biopsy-proven malignancy within the posterior UPPER-OUTER
RIGHT breast and 2 abnormal RIGHT axillary lymph nodes, 1 biopsy
proven to be metastatic disease.
2. No other evidence of malignancy within either breast.
3. Multiple cysts and scattered foci within both breast.

RECOMMENDATION:
Treatment plan

BI-RADS CATEGORY  6: Known biopsy-proven malignancy.

## 2020-10-23 MED ORDER — GADOBUTROL 1 MMOL/ML IV SOLN
5.0000 mL | Freq: Once | INTRAVENOUS | Status: AC | PRN
  Administered 2020-10-23: 5 mL via INTRAVENOUS

## 2020-10-24 ENCOUNTER — Encounter: Payer: Self-pay | Admitting: Obstetrics and Gynecology

## 2020-10-24 DIAGNOSIS — C50411 Malignant neoplasm of upper-outer quadrant of right female breast: Secondary | ICD-10-CM

## 2020-10-24 NOTE — Progress Notes (Signed)
Supported patient at initial Med/Onc consult on 10/22/20.  Spoke to her today about request for second opinion at [facility] related to concerns about axillary dissection.  She will contact her GYN provider for referral for consult.

## 2020-10-24 NOTE — Telephone Encounter (Signed)
Spoke with pt at 12:30. She would like 2nd opinion for gen surg and [facility] and will send MD info

## 2020-10-25 ENCOUNTER — Other Ambulatory Visit: Payer: Self-pay | Admitting: Obstetrics and Gynecology

## 2020-10-25 ENCOUNTER — Other Ambulatory Visit: Payer: Self-pay | Admitting: Anatomic Pathology & Clinical Pathology

## 2020-10-25 ENCOUNTER — Other Ambulatory Visit: Payer: Self-pay

## 2020-10-25 DIAGNOSIS — C773 Secondary and unspecified malignant neoplasm of axilla and upper limb lymph nodes: Secondary | ICD-10-CM | POA: Diagnosis not present

## 2020-10-25 DIAGNOSIS — C50411 Malignant neoplasm of upper-outer quadrant of right female breast: Secondary | ICD-10-CM | POA: Diagnosis not present

## 2020-10-27 DIAGNOSIS — Z7189 Other specified counseling: Secondary | ICD-10-CM | POA: Insufficient documentation

## 2020-10-27 DIAGNOSIS — C50919 Malignant neoplasm of unspecified site of unspecified female breast: Secondary | ICD-10-CM | POA: Insufficient documentation

## 2020-10-27 NOTE — Progress Notes (Signed)
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 ULTRASOUND GUIDED RIGHT AXILLARY LYMPH NODE CORE NEEDLE BIOPSY COMPARISON:  Previous exam(s). PROCEDURE: I met with the patient and we discussed the procedure of ultrasound-guided biopsy, including benefits and alternatives. We discussed the high likelihood of a successful procedure. We discussed the risks of the procedure, including infection, bleeding, tissue injury, clip migration, and inadequate sampling. Informed written consent was given. The usual time-out protocol was performed immediately prior to the procedure. Lesion #1: 10:30 o'clock position, 1.2 cm, right breast mass. Lesion quadrant: Upper outer quadrant Using sterile technique and 1% Lidocaine as local anesthetic, under direct ultrasound visualization, a 12 gauge spring-loaded device was used to perform biopsy of the 1.2 cm upper-outer quadrant mass using a  inferolateral approach. At the conclusion of the procedure ribbon shaped tissue marker clip was deployed into the biopsy cavity. Lesion #2: Abnormal right  axillary lymph node. Lesion location: Right axilla Using sterile technique and 1% Lidocaine as local anesthetic, under direct ultrasound visualization, a 14 gauge spring-loaded device was used to perform biopsy of the largest of the 2 adjacent abnormal right axillary lymph nodes using an inferior approach. At the conclusion of the procedure a HydroMARK tissue marker clip was deployed into the biopsy cavity. Follow up 2 view mammogram was performed and dictated separately. IMPRESSION: Ultrasound guided biopsy of a right breast mass and 1 of 2 abnormal right axillary lymph nodes. No apparent complications. Electronically Signed: By: Lajean Manes M.D. On: 10/18/2020 13:50   Assessment and plan- Patient is a 60 y.o. 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  Discussed that she has a grade 3 tumor.  Given the rapid development of the mass in the last 6 months when her prior mammogram was normal in March 2022 indicates aggressive disease and could be ER negative or HER2 positive.  It is very likely that she will require neoadjuvant chemotherapy but I will need to wait for ER/PR and HER2 to be back before deciding upon that.  Chemotherapy protocols differ between ER/PR positive and negative as well as HER2 positive and negative disease.  I will obtain MRI bilateral breasts for further work-up.  Given that she has palpable right axillary adenopathy in terms of surgery she likely requires axillary lymph node dissection.  If breast MRI does not show any additional concerning masses lumpectomy showed suffice in terms of definitive surgery.  Patient would also benefit from adjuvant radiation treatment following surgery.she will discuss furtehr surical options with Dr. Peyton Najjar  Given that patient reports bilateral hip pain as well as  bilateral chest wall pain I will obtain PET CT scan for further work-up as well as obtain baseline labs CBC with differential and CMP today.  I will see the patient back after ER/PR and HER2 status, MRI and PET CT scan results are back for further discussion.  Patient will be seeing Dr. Peyton Najjar today to discuss surgical management.  For tumor is ER/PR positive there would also be role for adjuvant hormone therapy which I will discuss with her in greater detail after the testing results are back.  Based on ER/PR and HER2 testing patient will also likely require genetic referral   Thank you for this kind referral and the opportunity to participate in the care of this patient   Visit Diagnosis 1. Invasive carcinoma of breast ([place])   2. Goals of care, counseling/discussion     Dr. Randa Evens, MD, MPH [facility] [phone] 10/27/2020 11:24 AM

## 2020-10-29 ENCOUNTER — Other Ambulatory Visit: Payer: Self-pay | Admitting: Obstetrics and Gynecology

## 2020-10-29 ENCOUNTER — Encounter: Payer: Self-pay | Admitting: Obstetrics and Gynecology

## 2020-10-29 ENCOUNTER — Other Ambulatory Visit: Payer: Self-pay

## 2020-10-29 DIAGNOSIS — Z170 Estrogen receptor positive status [ER+]: Secondary | ICD-10-CM | POA: Diagnosis not present

## 2020-10-29 DIAGNOSIS — N6001 Solitary cyst of right breast: Secondary | ICD-10-CM | POA: Diagnosis not present

## 2020-10-29 DIAGNOSIS — C50911 Malignant neoplasm of unspecified site of right female breast: Secondary | ICD-10-CM | POA: Diagnosis not present

## 2020-10-29 MED FILL — Estradiol Vaginal Cream 0.1 MG/GM: VAGINAL | 90 days supply | Qty: 42.5 | Fill #0 | Status: AC

## 2020-10-30 ENCOUNTER — Inpatient Hospital Stay: Payer: 59 | Attending: Oncology | Admitting: Oncology

## 2020-10-30 ENCOUNTER — Encounter: Payer: Self-pay | Admitting: Oncology

## 2020-10-30 ENCOUNTER — Other Ambulatory Visit: Payer: Self-pay

## 2020-10-30 VITALS — BP 134/88 | HR 79 | Temp 97.8°F | Resp 16 | Wt 131.8 lb

## 2020-10-30 DIAGNOSIS — Z171 Estrogen receptor negative status [ER-]: Secondary | ICD-10-CM | POA: Diagnosis not present

## 2020-10-30 DIAGNOSIS — C50911 Malignant neoplasm of unspecified site of right female breast: Secondary | ICD-10-CM | POA: Diagnosis not present

## 2020-10-30 DIAGNOSIS — Z7189 Other specified counseling: Secondary | ICD-10-CM | POA: Diagnosis not present

## 2020-10-30 DIAGNOSIS — Z170 Estrogen receptor positive status [ER+]: Secondary | ICD-10-CM | POA: Diagnosis not present

## 2020-10-30 DIAGNOSIS — C773 Secondary and unspecified malignant neoplasm of axilla and upper limb lymph nodes: Secondary | ICD-10-CM | POA: Insufficient documentation

## 2020-10-30 DIAGNOSIS — C50411 Malignant neoplasm of upper-outer quadrant of right female breast: Secondary | ICD-10-CM | POA: Diagnosis not present

## 2020-10-31 ENCOUNTER — Encounter: Payer: Self-pay | Admitting: Oncology

## 2020-11-01 ENCOUNTER — Other Ambulatory Visit: Payer: Self-pay

## 2020-11-01 ENCOUNTER — Telehealth: Payer: Self-pay | Admitting: *Deleted

## 2020-11-01 DIAGNOSIS — C50911 Malignant neoplasm of unspecified site of right female breast: Secondary | ICD-10-CM | POA: Diagnosis not present

## 2020-11-01 DIAGNOSIS — C7989 Secondary malignant neoplasm of other specified sites: Secondary | ICD-10-CM | POA: Diagnosis not present

## 2020-11-01 DIAGNOSIS — C50919 Malignant neoplasm of unspecified site of unspecified female breast: Secondary | ICD-10-CM

## 2020-11-01 DIAGNOSIS — R918 Other nonspecific abnormal finding of lung field: Secondary | ICD-10-CM | POA: Diagnosis not present

## 2020-11-01 NOTE — Telephone Encounter (Signed)
Called and spoke to Network engineer and wa told to leave a message for the nurse with pt's MRN and name and when I need the port and they will call me back and left my direct phone number.

## 2020-11-03 ENCOUNTER — Encounter: Payer: Self-pay | Admitting: Oncology

## 2020-11-03 DIAGNOSIS — C50411 Malignant neoplasm of upper-outer quadrant of right female breast: Secondary | ICD-10-CM | POA: Insufficient documentation

## 2020-11-03 DIAGNOSIS — Z170 Estrogen receptor positive status [ER+]: Secondary | ICD-10-CM | POA: Insufficient documentation

## 2020-11-03 NOTE — Progress Notes (Signed)
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 direct ultrasound visualization, a 12 gauge spring-loaded device was used to perform biopsy of the 1.2 cm upper-outer quadrant mass using a inferolateral approach. At the conclusion of the procedure ribbon shaped tissue marker clip was deployed into the biopsy cavity. Lesion #2: Abnormal right axillary lymph node. Lesion location: Right axilla Using sterile technique and 1% Lidocaine as local anesthetic, under direct ultrasound visualization, a 14 gauge spring-loaded device was used to perform biopsy of the largest of the 2 adjacent abnormal right axillary lymph nodes using an inferior approach. At the conclusion of the procedure a HydroMARK tissue marker clip was deployed into the biopsy cavity. Follow up 2 view mammogram was performed and dictated separately. IMPRESSION: Ultrasound guided biopsy of a right breast mass and 1 of 2 abnormal right axillary lymph nodes. No apparent complications. Electronically Signed: By: Lajean Manes M.D. On: 10/18/2020 13:50  Assessment and plan-  Patient is a 60 y.o. 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  She does have multiple bilateral cysts.  MRI therefore did not show any more disease than what was already known on mammogram.  I had recommended a PET CT scan for staging work-up but she has a CT chest abdomen and pelvis with bone scan ordered through [facility] and she wishes to get that done instead of a PET scan and therefore we will go and cancel the PET scan at this time.  With regards to surgical management: Patient desires to get her surgery which would be a lumpectomy at [facility].  She is being potentially considered for targeted removal of the biopsy-proven lymph nodes inSentinel lymph node biopsy instead of an axillary lymph node dissection to reduce the chances of lymphedema.  We discussed that her tumor was weakly ER +1 to 10%, PR negative and HER2 negative and is essentially behaving as a triple negative tumor.  With lymph node positive disease she would benefit from neoadjuvant chemotherapy and I would treat her as per keynote 522 trial.  I would recommendCarboplatin at AUC 5 given IV every 3 weeks for 4 cycles along with weekly Taxol at 80 mg per metered square for 12 cycles plus pembrolizumab 200 mg IV 3 weeks for 4 cycles.  Following this I will obtain an interim ultrasound which can be done at [facility] as well to see how she is responding to chemotherapy.  If patient continues to respond well clinically and on the basis of ultrasound she will proceed with doxorubicin at 60 mg per metered square along with cyclophosphamide 600 mg per metered square and  pembrolizumab 200 mg IV every 3 weeks for 4 cycles.  Discussed risks and benefits of chemotherapy including all but not limited to nausea, vomiting, low blood counts, risk of infections and hospitalizations.  Risk of peripheral neuropathy, hair loss and infusion reaction associated with carboplatin and Taxol.  Discussed the risk of cardiotoxicity associated with doxorubicin and the need for getting an echocardiogram prior to starting doxorubicin.  Discussed the risks of immunotherapy including all but not limited to autoimmune side effect such as colitis pneumonitis thyroiditis endocrinopathies as well as abnormal kidney and liver functions.  Treatment will be given with a curative intent.Keynote 522 trial showed that patients who received pembrolizumab along with standard chemotherapy at a higher rate of pathological complete response 64.8% versus 51.2% in the placebo group.  At a median follow-up of 15.5 months 7.4% in the pembrolizumab group had disease progression as compared to 11.8% in the placebo group.  Patient understands and wanted to take some time to think as to where she would like to get her chemotherapy and here at [facility].  I did call her a day later after she talked with her family and said that she wishes to proceed with chemotherapy here at [facility].  She is willing to get her port placement here as well and we will plan to get that with interventional radiology next week.  She is also willing to proceed with chemotherapy teach at [facility].  I will tentatively see  her in 1 week to start chemotherapy on 11/08/2020.  The goal of neoadjuvant chemotherapy is to achieve a pathological complete response.  Patient will continue adjuvant Keytruda for 9 cycles following neoadjuvant chemotherapy and surgery.  Whether adjuvant Xeloda should be pursued in patients who do not achieve a pathological complete response has not been studied but that is something which can be considered down the line.  She would also  benefit from genetic testing to see if she has germline BRCA2 mutation that would qualify her for adjuvant olaparib if she does not achieve a pathological complete response but again that has not been studied in a clinical trial following keynote 522 regimen.   Patient is currently using topical vaginal estrogen twice a week for symptoms of vaginal dryness as well as bladder spasm.  Explained to the patient that her tumor is weakly ER positive but is still dependent on estrogen to some extent.  Vaginal estrogen has low and unpredictable systemic estrogen bioavailability.  Ideally patient should come off topical vaginal estrogen.  However she is concerned about her bladder spasms getting worse especially during chemotherapy.  I have asked her to at least cut down her vaginal estrogen topically to once a week and we can see if we can eventually wean her off and she could try nonhormonal lubricants for vaginal dryness.  There would also be role for adjuvant radiation treatment down the line after completion of neoadjuvant chemotherapy and surgery.  Given that her tumor is weakly ER positive endocrine therapy could be considered down the line as well but she is unlikely to have a significant benefit from it.  Patient and her daughters had multiple insightful questions and all of them were answered to their satisfaction  Cancer Staging Malignant neoplasm of upper-outer quadrant of right breast in female, estrogen receptor positive ([place]) Staging form: Breast, AJCC 8th Edition - Clinical stage from 10/30/2020: Stage IIB (cT1c, cN1, cM0, G3, ER+, PR-, HER2-) - Signed by Sindy Guadeloupe, MD on 11/03/2020 Histologic grading system: 3 grade system      Visit Diagnosis 1. Malignant neoplasm of upper-outer quadrant of right breast in female, estrogen receptor positive ([place])   2. Goals of care, counseling/discussion      Dr. Randa Evens, MD, MPH [facility] at [facility] [phone] 11/03/2020 6:51 PM

## 2020-11-03 NOTE — Progress Notes (Signed)
DISCONTINUE ON PATHWAY REGIMEN - Breast  No Medical Intervention - Off Treatment.  PRIOR TREATMENT: Off Treatment  Breast - No Medical Intervention - Off Treatment.  Patient Characteristics: Preoperative or Nonsurgical Candidate (Clinical Staging), Neoadjuvant Therapy followed by Surgery, Invasive Disease Therapeutic Status: Preoperative or Nonsurgical Candidate (Clinical Staging) AJCC M Category: cM0 AJCC Grade: G3 Breast Surgical Plan: Neoadjuvant Therapy followed by Surgery ER Status: Positive (+) AJCC 8 Stage Grouping: IIB HER2 Status: Negative (-) AJCC T Category: cT1c AJCC N Category: cN1 PR Status: Negative (-)

## 2020-11-03 NOTE — Progress Notes (Signed)
Breast - No Medical Intervention - Off Treatment.  Patient Characteristics: Preoperative or Nonsurgical Candidate (Clinical Staging), Neoadjuvant Therapy followed by Surgery, Invasive Disease Therapeutic Status: Preoperative or Nonsurgical Candidate (Clinical Staging) AJCC M Category: cM0 AJCC Grade: G3 Breast Surgical Plan: Neoadjuvant Therapy followed by Surgery ER Status: Positive (+) AJCC 8 Stage Grouping: IIB HER2 Status: Negative (-) AJCC T Category: cT1c AJCC N Category: cN1 PR Status: Negative (-)

## 2020-11-03 NOTE — Progress Notes (Signed)
DISCONTINUE ON PATHWAY REGIMEN - Breast  No Medical Intervention - Off Treatment.  REASON: Other Reason PRIOR TREATMENT: Off Treatment  START OFF PATHWAY REGIMEN - Breast   [id number]:Carboplatin AUC=5 IV D1 + Paclitaxel 80 mg/m2 IV D1,8,15 + G-CSF q21 Days + Pembrolizumab 400 mg IV D1 q42 Days x 12 Weeks Followed by [facility] + G-CSF q21 Days + Pembrolizumab 400 mg IV D1 q42 Days x 12 Weeks:   Pembrolizumab cycles 1 through 4: A cycle is every 42 days:     Pembrolizumab    Chemotherapy cycles 1 through 4: A cycle is every 21 days:     Paclitaxel      Carboplatin      Filgrastim-xxxx    Chemotherapy cycles 5 through 8: A cycle is every 21 days:     Doxorubicin      Cyclophosphamide      Pegfilgrastim-xxxx   **Always confirm dose/schedule in your pharmacy ordering system**  Patient Characteristics: Preoperative or Nonsurgical Candidate (Clinical Staging), Neoadjuvant Therapy followed by Surgery, Invasive Disease, Chemotherapy, HER2 Negative/Unknown/Equivocal, ER Positive Therapeutic Status: Preoperative or Nonsurgical Candidate (Clinical Staging) AJCC M Category: cM0 AJCC Grade: G3 Breast Surgical Plan: Neoadjuvant Therapy followed by Surgery ER Status: Positive (+) AJCC 8 Stage Grouping: IIB HER2 Status: Negative (-) AJCC T Category: cT1c AJCC N Category: cN1 PR Status: Negative (-) Intent of Therapy: Curative Intent, Discussed with Patient

## 2020-11-04 ENCOUNTER — Other Ambulatory Visit: Payer: Self-pay | Admitting: Radiology

## 2020-11-04 ENCOUNTER — Encounter: Payer: Self-pay | Admitting: Obstetrics and Gynecology

## 2020-11-04 ENCOUNTER — Telehealth: Payer: Self-pay | Admitting: Obstetrics and Gynecology

## 2020-11-04 DIAGNOSIS — C50919 Malignant neoplasm of unspecified site of unspecified female breast: Secondary | ICD-10-CM

## 2020-11-04 DIAGNOSIS — C50411 Malignant neoplasm of upper-outer quadrant of right female breast: Secondary | ICD-10-CM | POA: Diagnosis not present

## 2020-11-04 DIAGNOSIS — R590 Localized enlarged lymph nodes: Secondary | ICD-10-CM | POA: Diagnosis not present

## 2020-11-04 DIAGNOSIS — N6315 Unspecified lump in the right breast, overlapping quadrants: Secondary | ICD-10-CM | POA: Diagnosis not present

## 2020-11-04 LAB — SURGICAL PATHOLOGY

## 2020-11-04 NOTE — Telephone Encounter (Signed)
I reviewed [facility] surgery/oncology notes. Pt with neg PET scan. Had some non specific LAN and possible pelvic congestion sydrome on CT abd/pelvis and GYN f/u recommended. I discussed with Dr. Glennon Mac (pt's pref) and he will f/u with gyn/onc to know best way to proceed. He will f/u with pt. Also encouraged pt to do [facility] testing at her convenience. She will sched nurse appt soon.  F/up rn.

## 2020-11-05 ENCOUNTER — Other Ambulatory Visit: Payer: Self-pay

## 2020-11-05 ENCOUNTER — Inpatient Hospital Stay: Payer: 59 | Attending: Oncology

## 2020-11-05 DIAGNOSIS — Z5111 Encounter for antineoplastic chemotherapy: Secondary | ICD-10-CM | POA: Diagnosis not present

## 2020-11-05 DIAGNOSIS — Z170 Estrogen receptor positive status [ER+]: Secondary | ICD-10-CM | POA: Diagnosis not present

## 2020-11-05 DIAGNOSIS — C773 Secondary and unspecified malignant neoplasm of axilla and upper limb lymph nodes: Secondary | ICD-10-CM | POA: Diagnosis not present

## 2020-11-05 DIAGNOSIS — C50411 Malignant neoplasm of upper-outer quadrant of right female breast: Secondary | ICD-10-CM | POA: Insufficient documentation

## 2020-11-05 DIAGNOSIS — Z79899 Other long term (current) drug therapy: Secondary | ICD-10-CM | POA: Insufficient documentation

## 2020-11-05 DIAGNOSIS — C50911 Malignant neoplasm of unspecified site of right female breast: Secondary | ICD-10-CM | POA: Diagnosis not present

## 2020-11-05 MED ORDER — LIDOCAINE-PRILOCAINE 2.5-2.5 % EX CREA
TOPICAL_CREAM | CUTANEOUS | 3 refills | Status: DC
  Filled 2020-11-05: qty 30, 30d supply, fill #0

## 2020-11-05 MED ORDER — ONDANSETRON HCL 8 MG PO TABS
8.0000 mg | ORAL_TABLET | Freq: Two times a day (BID) | ORAL | 1 refills | Status: DC | PRN
  Filled 2020-11-05: qty 30, 15d supply, fill #0
  Filled 2021-03-25: qty 30, 15d supply, fill #1

## 2020-11-05 MED ORDER — DEXAMETHASONE 4 MG PO TABS
ORAL_TABLET | ORAL | 1 refills | Status: DC
Start: 2020-11-05 — End: 2021-05-30
  Filled 2020-11-05: qty 30, 15d supply, fill #0
  Filled 2021-01-21: qty 30, 15d supply, fill #1

## 2020-11-05 MED ORDER — PROCHLORPERAZINE MALEATE 10 MG PO TABS
10.0000 mg | ORAL_TABLET | Freq: Four times a day (QID) | ORAL | 1 refills | Status: DC | PRN
  Filled 2020-11-05: qty 30, 8d supply, fill #0

## 2020-11-05 NOTE — Progress Notes (Signed)
Patient on schedule for Port placement 11/07/2020, called and spoke with patient on phone with pre procedure instructions given. Made aware to be here @ 0730, NPO after MN, and driver post procedure/recovery/discharge. Stated understanding.

## 2020-11-06 ENCOUNTER — Other Ambulatory Visit: Payer: Self-pay | Admitting: Radiology

## 2020-11-06 ENCOUNTER — Encounter: Payer: Self-pay | Admitting: Oncology

## 2020-11-06 ENCOUNTER — Telehealth: Payer: Self-pay | Admitting: Obstetrics and Gynecology

## 2020-11-06 ENCOUNTER — Other Ambulatory Visit: Payer: Self-pay

## 2020-11-06 NOTE — Telephone Encounter (Signed)
Called patient and discussed CT findings of a couple of enlarged lymph nodes in the pelvis along with some pelvic congestion. We discussed that these findings are of uncertain significance at this time. Without evidence of tumor in the areas that are drained by these nodes, having these nodes be of significance seems unlikely. I did offer to have her see gynecologic oncology for another opinion. She will discuss with Dr. [name] and I will facilitate whatever makes the most sense in her overall treatment plan, to the extent that I'm needed.

## 2020-11-07 ENCOUNTER — Ambulatory Visit
Admission: RE | Admit: 2020-11-07 | Discharge: 2020-11-07 | Disposition: A | Discharge status: Home / Self Care | Payer: 59 | Source: Ambulatory Visit | Attending: Oncology | Admitting: Oncology

## 2020-11-07 ENCOUNTER — Other Ambulatory Visit: Payer: Self-pay

## 2020-11-07 ENCOUNTER — Encounter: Payer: Self-pay | Admitting: Radiology

## 2020-11-07 DIAGNOSIS — C50911 Malignant neoplasm of unspecified site of right female breast: Secondary | ICD-10-CM | POA: Diagnosis not present

## 2020-11-07 DIAGNOSIS — Z452 Encounter for adjustment and management of vascular access device: Secondary | ICD-10-CM | POA: Diagnosis not present

## 2020-11-07 DIAGNOSIS — Z79899 Other long term (current) drug therapy: Secondary | ICD-10-CM | POA: Insufficient documentation

## 2020-11-07 DIAGNOSIS — C50411 Malignant neoplasm of upper-outer quadrant of right female breast: Secondary | ICD-10-CM | POA: Insufficient documentation

## 2020-11-07 DIAGNOSIS — C50919 Malignant neoplasm of unspecified site of unspecified female breast: Secondary | ICD-10-CM

## 2020-11-07 DIAGNOSIS — Z888 Allergy status to other drugs, medicaments and biological substances status: Secondary | ICD-10-CM | POA: Diagnosis not present

## 2020-11-07 DIAGNOSIS — C773 Secondary and unspecified malignant neoplasm of axilla and upper limb lymph nodes: Secondary | ICD-10-CM | POA: Diagnosis not present

## 2020-11-07 HISTORY — PX: IR IMAGING GUIDED PORT INSERTION: IMG5740

## 2020-11-07 MED ORDER — HEPARIN SOD (PORK) LOCK FLUSH 100 UNIT/ML IV SOLN
INTRAVENOUS | Status: AC
  Administered 2020-11-07: 500 [IU]
  Filled 2020-11-07: qty 5

## 2020-11-07 MED ORDER — MIDAZOLAM HCL 2 MG/2ML IJ SOLN
INTRAMUSCULAR | Status: DC | PRN
  Administered 2020-11-07 (×2): 1 mg via INTRAVENOUS

## 2020-11-07 MED ORDER — LIDOCAINE-EPINEPHRINE (PF) 2 %-1:200000 IJ SOLN
INTRAMUSCULAR | Status: AC
  Administered 2020-11-07: 10 mL
  Filled 2020-11-07: qty 20

## 2020-11-07 MED ORDER — FENTANYL CITRATE (PF) 100 MCG/2ML IJ SOLN
INTRAMUSCULAR | Status: DC | PRN
  Administered 2020-11-07 (×2): 50 ug via INTRAVENOUS

## 2020-11-07 MED ORDER — MIDAZOLAM HCL 2 MG/2ML IJ SOLN
INTRAMUSCULAR | Status: AC
  Filled 2020-11-07: qty 4

## 2020-11-07 MED ORDER — LIDOCAINE HCL 1 % IJ SOLN
INTRAMUSCULAR | Status: AC
  Administered 2020-11-07: 12 mL
  Filled 2020-11-07: qty 20

## 2020-11-07 MED ORDER — SODIUM CHLORIDE 0.9 % IV SOLN
INTRAVENOUS | Status: DC
  Filled 2020-11-07: qty 1000

## 2020-11-07 MED ORDER — FENTANYL CITRATE (PF) 100 MCG/2ML IJ SOLN
INTRAMUSCULAR | Status: AC
  Filled 2020-11-07: qty 2
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
## 2020-11-07 NOTE — Procedures (Signed)
Interventional Radiology Procedure:   Indications: Right breast cancer  Procedure: Port placement  Findings: Left jugular port, tip at SVC/RA junction  Complications: None     EBL: Minimal, less than 10 ml  Plan: Discharge in one hour.  Keep port site and incisions dry for at least 24 hours.     Briley Bumgarner R. Anselm Pancoast, MD  Pager: [phone]

## 2020-11-08 ENCOUNTER — Encounter: Payer: Self-pay | Admitting: Oncology

## 2020-11-08 ENCOUNTER — Inpatient Hospital Stay: Payer: 59

## 2020-11-08 ENCOUNTER — Other Ambulatory Visit: Payer: Self-pay | Admitting: *Deleted

## 2020-11-08 ENCOUNTER — Inpatient Hospital Stay (HOSPITAL_BASED_OUTPATIENT_CLINIC_OR_DEPARTMENT_OTHER): Payer: 59 | Admitting: Oncology

## 2020-11-08 VITALS — BP 129/75 | HR 76 | Temp 96.6°F | Resp 16 | Wt 129.5 lb

## 2020-11-08 VITALS — BP 116/76 | HR 63

## 2020-11-08 DIAGNOSIS — C50411 Malignant neoplasm of upper-outer quadrant of right female breast: Secondary | ICD-10-CM | POA: Diagnosis not present

## 2020-11-08 DIAGNOSIS — Z170 Estrogen receptor positive status [ER+]: Secondary | ICD-10-CM | POA: Diagnosis not present

## 2020-11-08 DIAGNOSIS — Z5112 Encounter for antineoplastic immunotherapy: Secondary | ICD-10-CM

## 2020-11-08 DIAGNOSIS — Z5111 Encounter for antineoplastic chemotherapy: Secondary | ICD-10-CM | POA: Diagnosis not present

## 2020-11-08 DIAGNOSIS — Z79899 Other long term (current) drug therapy: Secondary | ICD-10-CM | POA: Diagnosis not present

## 2020-11-08 LAB — COMPREHENSIVE METABOLIC PANEL
ALT: 16 U/L (ref 0–44)
AST: 23 U/L (ref 15–41)
Albumin: 4.3 g/dL (ref 3.5–5.0)
Alkaline Phosphatase: 67 U/L (ref 38–126)
Anion gap: 9 (ref 5–15)
BUN: 16 mg/dL (ref 8–23)
CO2: 27 mmol/L (ref 22–32)
Calcium: 9.3 mg/dL (ref 8.9–10.3)
Chloride: 101 mmol/L (ref 98–111)
Creatinine, Ser: 0.85 mg/dL (ref 0.44–1.00)
GFR, Estimated: >60 mL/min (ref >60)
Glucose, Bld: 109 mg/dL — ABNORMAL HIGH (ref 70–99)
Potassium: 3.8 mmol/L (ref 3.5–5.1)
Sodium: 137 mmol/L (ref 135–145)
Total Bilirubin: 0.8 mg/dL (ref 0.3–1.2)
Total Protein: 7.3 g/dL (ref 6.5–8.1)

## 2020-11-08 LAB — CBC WITH DIFFERENTIAL/PLATELET
Abs Immature Granulocytes: 0.01 10*3/uL (ref 0.00–0.07)
Basophils Absolute: 0 10*3/uL (ref 0.0–0.1)
Basophils Relative: 0 %
Eosinophils Absolute: 0.2 10*3/uL (ref 0.0–0.5)
Eosinophils Relative: 3 %
HCT: 39.4 % (ref 36.0–46.0)
Hemoglobin: 13.7 g/dL (ref 12.0–15.0)
Immature Granulocytes: 0 %
Lymphocytes Relative: 26 %
Lymphs Abs: 1.6 10*3/uL (ref 0.7–4.0)
MCH: 31.4 pg (ref 26.0–34.0)
MCHC: 34.8 g/dL (ref 30.0–36.0)
MCV: 90.2 fL (ref 80.0–100.0)
Monocytes Absolute: 0.4 10*3/uL (ref 0.1–1.0)
Monocytes Relative: 7 %
Neutro Abs: 3.8 10*3/uL (ref 1.7–7.7)
Neutrophils Relative %: 64 %
Platelets: 241 10*3/uL (ref 150–400)
RBC: 4.37 MIL/uL (ref 3.87–5.11)
RDW: 11.9 % (ref 11.5–15.5)
WBC: 6 10*3/uL (ref 4.0–10.5)
nRBC: 0 % (ref 0.0–0.2)

## 2020-11-08 MED ORDER — SODIUM CHLORIDE 0.9 % IV SOLN
200.0000 mg | Freq: Once | INTRAVENOUS | Status: AC
  Administered 2020-11-08: 200 mg via INTRAVENOUS
  Filled 2020-11-08: qty 8

## 2020-11-08 MED ORDER — PALONOSETRON HCL INJECTION 0.25 MG/5ML
0.2500 mg | Freq: Once | INTRAVENOUS | Status: AC
  Administered 2020-11-08: 0.25 mg via INTRAVENOUS
  Filled 2020-11-08: qty 5

## 2020-11-08 MED ORDER — SODIUM CHLORIDE 0.9 % IV SOLN
150.0000 mg | Freq: Once | INTRAVENOUS | Status: AC
  Administered 2020-11-08: 150 mg via INTRAVENOUS
  Filled 2020-11-08: qty 150

## 2020-11-08 MED ORDER — SODIUM CHLORIDE 0.9 % IV SOLN
Freq: Once | INTRAVENOUS | Status: AC
  Filled 2020-11-08: qty 250

## 2020-11-08 MED ORDER — SODIUM CHLORIDE 0.9 % IV SOLN
453.0000 mg | Freq: Once | INTRAVENOUS | Status: AC
  Administered 2020-11-08: 450 mg via INTRAVENOUS
  Filled 2020-11-08: qty 45

## 2020-11-08 MED ORDER — DIPHENHYDRAMINE HCL 50 MG/ML IJ SOLN
50.0000 mg | Freq: Once | INTRAMUSCULAR | Status: AC
  Administered 2020-11-08: 50 mg via INTRAVENOUS
  Filled 2020-11-08: qty 1

## 2020-11-08 MED ORDER — SODIUM CHLORIDE 0.9 % IV SOLN
80.0000 mg/m2 | Freq: Once | INTRAVENOUS | Status: AC
  Administered 2020-11-08: 126 mg via INTRAVENOUS
  Filled 2020-11-08: qty 21

## 2020-11-08 MED ORDER — SODIUM CHLORIDE 0.9% FLUSH
10.0000 mL | INTRAVENOUS | Status: DC | PRN
  Administered 2020-11-08: 10 mL via INTRAVENOUS
  Filled 2020-11-08: qty 10

## 2020-11-08 MED ORDER — FAMOTIDINE 20 MG IN NS 100 ML IVPB
20.0000 mg | Freq: Once | INTRAVENOUS | Status: AC
  Administered 2020-11-08: 20 mg via INTRAVENOUS
  Filled 2020-11-08: qty 20

## 2020-11-08 MED ORDER — HEPARIN SOD (PORK) LOCK FLUSH 100 UNIT/ML IV SOLN
500.0000 [IU] | Freq: Once | INTRAVENOUS | Status: AC
  Administered 2020-11-08: 500 [IU] via INTRAVENOUS
  Filled 2020-11-08: qty 5

## 2020-11-08 MED ORDER — SODIUM CHLORIDE 0.9 % IV SOLN
10.0000 mg | Freq: Once | INTRAVENOUS | Status: AC
  Administered 2020-11-08: 10 mg via INTRAVENOUS
  Filled 2020-11-08: qty 10

## 2020-11-08 NOTE — Addendum Note (Signed)
Addended by: Randa Evens C on: 11/08/2020 03:59 PM   Modules accepted: Orders

## 2020-11-08 NOTE — Patient Instructions (Signed)
[facility]  Discharge Instructions: Thank you for choosing [facility] to provide your oncology and hematology care.  If you have a lab appointment with the [facility], please go directly to the [facility] and check in at the registration area.  Wear comfortable clothing and clothing appropriate for easy access to any Portacath or PICC line.   We strive to give you quality time with your provider. You may need to reschedule your appointment if you arrive late (15 or more minutes).  Arriving late affects you and other patients whose appointments are after yours.  Also, if you miss three or more appointments without notifying the office, you may be dismissed from the clinic at the provider's discretion.      For prescription refill requests, have your pharmacy contact our office and allow 72 hours for refills to be completed.    Today you received the following chemotherapy and/or immunotherapy agents: Keytruda, Carboplatin, Taxol      To help prevent nausea and vomiting after your treatment, we encourage you to take your nausea medication as directed.  BELOW ARE SYMPTOMS THAT SHOULD BE REPORTED IMMEDIATELY: *FEVER GREATER THAN 100.4 F (38 C) OR HIGHER *CHILLS OR SWEATING *NAUSEA AND VOMITING THAT IS NOT CONTROLLED WITH YOUR NAUSEA MEDICATION *UNUSUAL SHORTNESS OF BREATH *UNUSUAL BRUISING OR BLEEDING *URINARY PROBLEMS (pain or burning when urinating, or frequent urination) *BOWEL PROBLEMS (unusual diarrhea, constipation, pain near the anus) TENDERNESS IN MOUTH AND THROAT WITH OR WITHOUT PRESENCE OF ULCERS (sore throat, sores in mouth, or a toothache) UNUSUAL RASH, SWELLING OR PAIN  UNUSUAL VAGINAL DISCHARGE OR ITCHING   Items with * indicate a potential emergency and should be followed up as soon as possible or go to the Emergency Department if any problems should occur.  Please show the CHEMOTHERAPY ALERT CARD or IMMUNOTHERAPY  ALERT CARD at check-in to the Emergency Department and triage nurse.  Should you have questions after your visit or need to cancel or reschedule your appointment, please contact [facility]  [phone] and follow the prompts.  Office hours are 8:00 a.m. to 4:30 p.m. Monday - Friday. Please note that voicemails left after 4:00 p.m. may not be returned until the following business day.  We are closed weekends and major holidays. You have access to a nurse at all times for urgent questions. Please call the main number to the clinic [phone] and follow the prompts.  For any non-urgent questions, you may also contact your provider using MyChart. We now offer e-Visits for anyone 59 and older to request care online for non-urgent symptoms. For details visit [email or web address].   Also download the MyChart app! Go to the app store, search "MyChart", open the app, select [facility], and log in with your MyChart username and password.  Due to Covid, a mask is required upon entering the hospital/clinic. If you do not have a mask, one will be given to you upon arrival. For doctor visits, patients may have 1 support person aged 49 or older with them. For treatment visits, patients cannot have anyone with them due to current Covid guidelines and our immunocompromised population. Carboplatin injection What is this medication? CARBOPLATIN (KAR boe pla tin) is a chemotherapy drug. It targets fast dividing cells, like cancer cells, and causes these cells to die. This medicine is used to treat ovarian cancer and many other cancers. This medicine may be used for other purposes; ask your health care provider or  pharmacist if you have questions. COMMON BRAND NAME(S): Paraplatin What should I tell my care team before I take this medication? They need to know if you have any of these conditions: blood disorders hearing problems kidney disease recent or ongoing radiation  therapy an unusual or allergic reaction to carboplatin, cisplatin, other chemotherapy, other medicines, foods, dyes, or preservatives pregnant or trying to get pregnant breast-feeding How should I use this medication? This drug is usually given as an infusion into a vein. It is administered in a hospital or clinic by a specially trained health care professional. Talk to your pediatrician regarding the use of this medicine in children. Special care may be needed. Overdosage: If you think you have taken too much of this medicine contact a poison control center or emergency room at once. NOTE: This medicine is only for you. Do not share this medicine with others. What if I miss a dose? It is important not to miss a dose. Call your doctor or health care professional if you are unable to keep an appointment. What may interact with this medication? medicines for seizures medicines to increase blood counts like filgrastim, pegfilgrastim, sargramostim some antibiotics like amikacin, gentamicin, neomycin, streptomycin, tobramycin vaccines Talk to your doctor or health care professional before taking any of these medicines: acetaminophen aspirin ibuprofen ketoprofen naproxen This list may not describe all possible interactions. Give your health care provider a list of all the medicines, herbs, non-prescription drugs, or dietary supplements you use. Also tell them if you smoke, drink alcohol, or use illegal drugs. Some items may interact with your medicine. What should I watch for while using this medication? Your condition will be monitored carefully while you are receiving this medicine. You will need important blood work done while you are taking this medicine. This drug may make you feel generally unwell. This is not uncommon, as chemotherapy can affect healthy cells as well as cancer cells. Report any side effects. Continue your course of treatment even though you feel ill unless your doctor tells  you to stop. In some cases, you may be given additional medicines to help with side effects. Follow all directions for their use. Call your doctor or health care professional for advice if you get a fever, chills or sore throat, or other symptoms of a cold or flu. Do not treat yourself. This drug decreases your body's ability to fight infections. Try to avoid being around people who are sick. This medicine may increase your risk to bruise or bleed. Call your doctor or health care professional if you notice any unusual bleeding. Be careful brushing and flossing your teeth or using a toothpick because you may get an infection or bleed more easily. If you have any dental work done, tell your dentist you are receiving this medicine. Avoid taking products that contain aspirin, acetaminophen, ibuprofen, naproxen, or ketoprofen unless instructed by your doctor. These medicines may hide a fever. Do not become pregnant while taking this medicine. Women should inform their doctor if they wish to become pregnant or think they might be pregnant. There is a potential for serious side effects to an unborn child. Talk to your health care professional or pharmacist for more information. Do not breast-feed an infant while taking this medicine. What side effects may I notice from receiving this medication? Side effects that you should report to your doctor or health care professional as soon as possible: allergic reactions like skin rash, itching or hives, swelling of the face, lips, or tongue  signs of infection - fever or chills, cough, sore throat, pain or difficulty passing urine signs of decreased platelets or bleeding - bruising, pinpoint red spots on the skin, black, tarry stools, nosebleeds signs of decreased red blood cells - unusually weak or tired, fainting spells, lightheadedness breathing problems changes in hearing changes in vision chest pain high blood pressure low blood counts - This drug may  decrease the number of white blood cells, red blood cells and platelets. You may be at increased risk for infections and bleeding. nausea and vomiting pain, swelling, redness or irritation at the injection site pain, tingling, numbness in the hands or feet problems with balance, talking, walking trouble passing urine or change in the amount of urine Side effects that usually do not require medical attention (report to your doctor or health care professional if they continue or are bothersome): hair loss loss of appetite metallic taste in the mouth or changes in taste This list may not describe all possible side effects. Call your doctor for medical advice about side effects. You may report side effects to FDA at 1-800-FDA-1088. Where should I keep my medication? This drug is given in a hospital or clinic and will not be stored at home. NOTE: This sheet is a summary. It may not cover all possible information. If you have questions about this medicine, talk to your doctor, pharmacist, or health care provider.  2022 Elsevier/Gold Standard (2007-05-17 14:38:05) Paclitaxel injection What is this medication? PACLITAXEL (PAK li TAX el) is a chemotherapy drug. It targets fast dividing cells, like cancer cells, and causes these cells to die. This medicine is used to treat ovarian cancer, breast cancer, lung cancer, Kaposi's sarcoma, and other cancers. This medicine may be used for other purposes; ask your health care provider or pharmacist if you have questions. COMMON BRAND NAME(S): Onxol, Taxol What should I tell my care team before I take this medication? They need to know if you have any of these conditions: history of irregular heartbeat liver disease low blood counts, like low white cell, platelet, or red cell counts lung or breathing disease, like asthma tingling of the fingers or toes, or other nerve disorder an unusual or allergic reaction to paclitaxel, alcohol, polyoxyethylated castor  oil, other chemotherapy, other medicines, foods, dyes, or preservatives pregnant or trying to get pregnant breast-feeding How should I use this medication? This drug is given as an infusion into a vein. It is administered in a hospital or clinic by a specially trained health care professional. Talk to your pediatrician regarding the use of this medicine in children. Special care may be needed. Overdosage: If you think you have taken too much of this medicine contact a poison control center or emergency room at once. NOTE: This medicine is only for you. Do not share this medicine with others. What if I miss a dose? It is important not to miss your dose. Call your doctor or health care professional if you are unable to keep an appointment. What may interact with this medication? Do not take this medicine with any of the following medications: live virus vaccines This medicine may also interact with the following medications: antiviral medicines for hepatitis, HIV or AIDS certain antibiotics like erythromycin and clarithromycin certain medicines for fungal infections like ketoconazole and itraconazole certain medicines for seizures like carbamazepine, phenobarbital, phenytoin gemfibrozil nefazodone rifampin St. John's wort This list may not describe all possible interactions. Give your health care provider a list of all the medicines, herbs, non-prescription drugs,  or dietary supplements you use. Also tell them if you smoke, drink alcohol, or use illegal drugs. Some items may interact with your medicine. What should I watch for while using this medication? Your condition will be monitored carefully while you are receiving this medicine. You will need important blood work done while you are taking this medicine. This medicine can cause serious allergic reactions. To reduce your risk you will need to take other medicine(s) before treatment with this medicine. If you experience allergic reactions  like skin rash, itching or hives, swelling of the face, lips, or tongue, tell your doctor or health care professional right away. In some cases, you may be given additional medicines to help with side effects. Follow all directions for their use. This drug may make you feel generally unwell. This is not uncommon, as chemotherapy can affect healthy cells as well as cancer cells. Report any side effects. Continue your course of treatment even though you feel ill unless your doctor tells you to stop. Call your doctor or health care professional for advice if you get a fever, chills or sore throat, or other symptoms of a cold or flu. Do not treat yourself. This drug decreases your body's ability to fight infections. Try to avoid being around people who are sick. This medicine may increase your risk to bruise or bleed. Call your doctor or health care professional if you notice any unusual bleeding. Be careful brushing and flossing your teeth or using a toothpick because you may get an infection or bleed more easily. If you have any dental work done, tell your dentist you are receiving this medicine. Avoid taking products that contain aspirin, acetaminophen, ibuprofen, naproxen, or ketoprofen unless instructed by your doctor. These medicines may hide a fever. Do not become pregnant while taking this medicine. Women should inform their doctor if they wish to become pregnant or think they might be pregnant. There is a potential for serious side effects to an unborn child. Talk to your health care professional or pharmacist for more information. Do not breast-feed an infant while taking this medicine. Men are advised not to father a child while receiving this medicine. This product may contain alcohol. Ask your pharmacist or healthcare provider if this medicine contains alcohol. Be sure to tell all healthcare providers you are taking this medicine. Certain medicines, like metronidazole and disulfiram, can cause an  unpleasant reaction when taken with alcohol. The reaction includes flushing, headache, nausea, vomiting, sweating, and increased thirst. The reaction can last from 30 minutes to several hours. What side effects may I notice from receiving this medication? Side effects that you should report to your doctor or health care professional as soon as possible: allergic reactions like skin rash, itching or hives, swelling of the face, lips, or tongue breathing problems changes in vision fast, irregular heartbeat high or low blood pressure mouth sores pain, tingling, numbness in the hands or feet signs of decreased platelets or bleeding - bruising, pinpoint red spots on the skin, black, tarry stools, blood in the urine signs of decreased red blood cells - unusually weak or tired, feeling faint or lightheaded, falls signs of infection - fever or chills, cough, sore throat, pain or difficulty passing urine signs and symptoms of liver injury like dark yellow or brown urine; general ill feeling or flu-like symptoms; light-colored stools; loss of appetite; nausea; right upper belly pain; unusually weak or tired; yellowing of the eyes or skin swelling of the ankles, feet, hands unusually slow heartbeat  Side effects that usually do not require medical attention (report to your doctor or health care professional if they continue or are bothersome): diarrhea hair loss loss of appetite muscle or joint pain nausea, vomiting pain, redness, or irritation at site where injected tiredness This list may not describe all possible side effects. Call your doctor for medical advice about side effects. You may report side effects to FDA at 1-800-FDA-1088. Where should I keep my medication? This drug is given in a hospital or clinic and will not be stored at home. NOTE: This sheet is a summary. It may not cover all possible information. If you have questions about this medicine, talk to your doctor, pharmacist, or  health care provider.  2022 Elsevier/Gold Standard (2019-01-11 13:37:23) Pembrolizumab injection What is this medication? PEMBROLIZUMAB (pem broe liz ue mab) is a monoclonal antibody. It is used to treat certain types of cancer. This medicine may be used for other purposes; ask your health care provider or pharmacist if you have questions. COMMON BRAND NAME(S): Keytruda What should I tell my care team before I take this medication? They need to know if you have any of these conditions: autoimmune diseases like Crohn's disease, ulcerative colitis, or lupus have had or planning to have an allogeneic stem cell transplant (uses someone else's stem cells) history of organ transplant history of chest radiation nervous system problems like myasthenia gravis or Guillain-Barre syndrome an unusual or allergic reaction to pembrolizumab, other medicines, foods, dyes, or preservatives pregnant or trying to get pregnant breast-feeding How should I use this medication? This medicine is for infusion into a vein. It is given by a health care professional in a hospital or clinic setting. A special MedGuide will be given to you before each treatment. Be sure to read this information carefully each time. Talk to your pediatrician regarding the use of this medicine in children. While this drug may be prescribed for children as young as 6 months for selected conditions, precautions do apply. Overdosage: If you think you have taken too much of this medicine contact a poison control center or emergency room at once. NOTE: This medicine is only for you. Do not share this medicine with others. What if I miss a dose? It is important not to miss your dose. Call your doctor or health care professional if you are unable to keep an appointment. What may interact with this medication? Interactions have not been studied. This list may not describe all possible interactions. Give your health care provider a list of all  the medicines, herbs, non-prescription drugs, or dietary supplements you use. Also tell them if you smoke, drink alcohol, or use illegal drugs. Some items may interact with your medicine. What should I watch for while using this medication? Your condition will be monitored carefully while you are receiving this medicine. You may need blood work done while you are taking this medicine. Do not become pregnant while taking this medicine or for 4 months after stopping it. Women should inform their doctor if they wish to become pregnant or think they might be pregnant. There is a potential for serious side effects to an unborn child. Talk to your health care professional or pharmacist for more information. Do not breast-feed an infant while taking this medicine or for 4 months after the last dose. What side effects may I notice from receiving this medication? Side effects that you should report to your doctor or health care professional as soon as possible: allergic reactions like skin  rash, itching or hives, swelling of the face, lips, or tongue bloody or black, tarry breathing problems changes in vision chest pain chills confusion constipation cough diarrhea dizziness or feeling faint or lightheaded fast or irregular heartbeat fever flushing joint pain low blood counts - this medicine may decrease the number of white blood cells, red blood cells and platelets. You may be at increased risk for infections and bleeding. muscle pain muscle weakness pain, tingling, numbness in the hands or feet persistent headache redness, blistering, peeling or loosening of the skin, including inside the mouth signs and symptoms of high blood sugar such as dizziness; dry mouth; dry skin; fruity breath; nausea; stomach pain; increased hunger or thirst; increased urination signs and symptoms of kidney injury like trouble passing urine or change in the amount of urine signs and symptoms of liver injury like dark  urine, light-colored stools, loss of appetite, nausea, right upper belly pain, yellowing of the eyes or skin sweating swollen lymph nodes weight loss Side effects that usually do not require medical attention (report to your doctor or health care professional if they continue or are bothersome): decreased appetite hair loss tiredness This list may not describe all possible side effects. Call your doctor for medical advice about side effects. You may report side effects to FDA at 1-800-FDA-1088. Where should I keep my medication? This drug is given in a hospital or clinic and will not be stored at home. NOTE: This sheet is a summary. It may not cover all possible information. If you have questions about this medicine, talk to your doctor, pharmacist, or health care provider.  2022 Elsevier/Gold Standard (2019-01-11 21:44:53)

## 2020-11-08 NOTE — Progress Notes (Signed)
Patient did not tolerated 50mg  IV Benadryl well today. Made her very sleepy, difficulty walking, and "feeling drunk". Tolerated her chemotherapy treatment well. Per [name] dose of Benadryl to be reduced for remainder of treatments. Pt was stable at discharge.

## 2020-11-08 NOTE — Progress Notes (Signed)
Does pt always have to wear a n95 mask when out, will like clarification on places she can go? such as concerts and church events? Does she need to get the flu shot? Talked about exercise program in class, will like to know what do about the class? If the only time she is not allowed to be around babies is when they recieved the live vaccines and for how long? Dr. 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 Daughter, will like to know if there is a study about intermminent fasting? Is that something the pt needs to do?

## 2020-11-08 NOTE — Progress Notes (Signed)
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 or questions arise related to the biopsy. Recommendations: 1. Surgical and oncological consultation. Request for surgical and oncological consultation relayed to Al Pimple RN and Tanya Nones RN at [facility] by Electa Sniff RN on 10/21/2020. 2. Consider bilateral breast MRI tissue given patient's age and heterogeneously dense breast tissue. Pathology results reported by Electa Sniff RN on 10/21/2020. Electronically Signed   By: Franki Cabot M.D.   On: 10/21/2020 12:59   Result Date: 10/22/2020 CLINICAL DATA:  Patient presents for ultrasound-guided core needle biopsy of a right breast mass and an abnormal right axillary lymph node. EXAM: ULTRASOUND GUIDED RIGHT BREAST CORE NEEDLE BIOPSY ULTRASOUND GUIDED RIGHT AXILLARY LYMPH NODE CORE NEEDLE  BIOPSY COMPARISON:  Previous exam(s). PROCEDURE: I met with the patient and we discussed the procedure of ultrasound-guided biopsy, including benefits and alternatives. We discussed the high likelihood of a successful procedure. We discussed the risks of  the procedure, including infection, bleeding, tissue injury, clip migration, and inadequate sampling. Informed written consent was given. The usual time-out protocol was performed immediately prior to the procedure. Lesion #1: 10:30 o'clock position, 1.2 cm, right breast mass. Lesion quadrant: Upper outer quadrant Using sterile technique and 1% Lidocaine as local anesthetic, under direct ultrasound visualization, a 12 gauge spring-loaded device was used to perform biopsy of the 1.2 cm upper-outer quadrant mass using a inferolateral approach. At the conclusion of the procedure ribbon shaped tissue marker clip was deployed into the biopsy cavity. Lesion #2: Abnormal right axillary lymph node. Lesion location: Right axilla Using sterile technique and 1% Lidocaine as local anesthetic, under direct ultrasound visualization, a 14 gauge spring-loaded device was used to perform biopsy of the largest of the 2 adjacent abnormal right axillary lymph nodes using an inferior approach. At the conclusion of the procedure a HydroMARK tissue marker clip was deployed into the biopsy cavity. Follow up 2 view mammogram was performed and dictated separately. IMPRESSION: Ultrasound guided biopsy of a right breast mass and 1 of 2 abnormal right axillary lymph nodes. No apparent complications. Electronically Signed: By: Lajean Manes M.D. On: 10/18/2020 13:50  IR IMAGING GUIDED PORT INSERTION  Result Date: 11/07/2020 INDICATION: 60 year old with right breast cancer. Port-A-Cath needed for chemotherapy. EXAM: FLUOROSCOPIC AND ULTRASOUND GUIDED PLACEMENT OF A SUBCUTANEOUS PORT. Physician: Stephan Minister. Anselm Pancoast, MD MEDICATIONS: Moderate sedation ANESTHESIA/SEDATION: Versed 2.0 mg IV; Fentanyl 100 mcg IV; Moderate Sedation Time:  37 minutes The patient was continuously monitored during the procedure by the interventional radiology nurse under my direct supervision. FLUOROSCOPY TIME:  36 seconds, 1.7 mGy COMPLICATIONS: None immediate. PROCEDURE: The  procedure was explained to the patient. The risks and benefits of the procedure were discussed and the patient's questions were addressed. Informed consent was obtained from the patient. Patient was placed supine on the interventional table. Ultrasound confirmed a patent left internal jugular vein. Ultrasound image was saved for documentation. The left chest and neck were cleaned with a skin antiseptic and a sterile drape was placed. Maximal barrier sterile technique was utilized including caps, mask, sterile gowns, sterile gloves, sterile drape, hand hygiene and skin antiseptic. The left neck was anesthetized with 1% lidocaine. Small incision was made in the left neck with a blade. Micropuncture set was placed in the left IJ with ultrasound guidance. The micropuncture wire was used for measurement purposes. The left chest was anesthetized with 1% lidocaine with epinephrine. #15 blade was used to make an incision and a subcutaneous port pocket was formed. Rice was assembled. Subcutaneous tunnel was formed with a stiff tunneling device. The port catheter was brought through the subcutaneous tunnel. The port was placed in the subcutaneous pocket. The micropuncture set was exchanged for a peel-away sheath. The catheter was placed through the peel-away sheath and the tip was positioned at the superior cavoatrial junction. Catheter placement was confirmed with fluoroscopy. The port was accessed and flushed with heparinized saline. The port pocket was closed using two layers of absorbable sutures and Dermabond. The vein skin site was closed using a single layer of absorbable suture and Dermabond. Sterile dressings were applied. Patient tolerated the procedure well without an immediate complication. Ultrasound and fluoroscopic images were taken and saved for this procedure. IMPRESSION:  Placement of a subcutaneous port device. Catheter tip at the superior cavoatrial junction. Electronically Signed   By: Markus Daft M.D.   On: 11/07/2020 10:06     Assessment and plan- Patient is a 60 y.o. 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  She is here for on treatment assessment prior to cycle 1 day 1 of carboplatin and Keytruda and Taxol  Counts okay to proceed with cycle 1 of [employer] today.  She will directly proceed for Taxol next week and I will see her back in 2 weeks for cycle 1 day 15 of weekly Taxol chemotherapy.  Discussed the results of CT chest abdomen and pelvis with contrast which did not show any evidence of metastatic disease however patient noted to have subcentimeter lung nodules which will need to be followed up in 3 to 4 months time.  Also noted to have prominent pelvic lymph nodes in the hypodensity in her liver which will be followed up with a CT scan as well.  Bone scan showed no evidence of distant metastatic disease.  Will refer patient for genetic testing to test for BRCA mutations.     Visit Diagnosis 1. Encounter for antineoplastic chemotherapy   2. Encounter for antineoplastic immunotherapy   3. Malignant neoplasm of upper-outer quadrant of right breast in female, estrogen receptor positive ([place])      Dr. Randa Evens, MD, MPH [facility] [phone] 11/08/2020 8:41 AM

## 2020-11-08 NOTE — Progress Notes (Signed)
Order for tsh fr her to start [place]

## 2020-11-11 ENCOUNTER — Ambulatory Visit
Admission: RE | Admit: 2020-11-11 | Discharge: 2020-11-11 | Disposition: A | Discharge status: Home / Self Care | Payer: 59 | Source: Ambulatory Visit | Attending: Oncology | Admitting: Oncology

## 2020-11-11 ENCOUNTER — Other Ambulatory Visit: Payer: Self-pay

## 2020-11-11 DIAGNOSIS — C50919 Malignant neoplasm of unspecified site of unspecified female breast: Secondary | ICD-10-CM | POA: Insufficient documentation

## 2020-11-11 DIAGNOSIS — Z0189 Encounter for other specified special examinations: Secondary | ICD-10-CM | POA: Diagnosis not present

## 2020-11-11 DIAGNOSIS — Z01818 Encounter for other preprocedural examination: Secondary | ICD-10-CM | POA: Diagnosis not present

## 2020-11-11 LAB — ECHOCARDIOGRAM COMPLETE
AR max vel: 2.46 cm2
AV Area VTI: 2.88 cm2
AV Area mean vel: 2.26 cm2
AV Mean grad: 3.5 mmHg
AV Peak grad: 6.1 mmHg
Ao pk vel: 1.24 m/s
Area-P 1/2: 2.88 cm2
S' Lateral: 2.7 cm

## 2020-11-12 ENCOUNTER — Telehealth: Payer: Self-pay

## 2020-11-12 NOTE — Telephone Encounter (Signed)
I spoke to here yesterday and said the benadryl made her drowsy, weak , and foggie with her thoughts and she said that the infusion staff had spoke with Amanda Skinner and the benadryl next time was going to be 12.5 mg

## 2020-11-12 NOTE — Telephone Encounter (Signed)
T/C to patient for follow up after receiving 1st chemo on Friday.  Pt states she did not tolerate Benadryl and still with "foggy ness".   Was unable to make out bills today due to feeling foggy and "cloudy".  States eating and drinking well.  Did have some nausea today but relieved by anti nausea meds.  Bowels are moving good.   Encouraged patient to call if "foggy ness" not better.

## 2020-11-13 ENCOUNTER — Encounter: Payer: Self-pay | Admitting: Oncology

## 2020-11-14 ENCOUNTER — Telehealth: Payer: Self-pay | Admitting: *Deleted

## 2020-11-14 NOTE — Telephone Encounter (Signed)
Patient called stating that she had treatment last week and has had light headedness. She is asking if this is going to be the norm. Please advise

## 2020-11-15 ENCOUNTER — Inpatient Hospital Stay: Payer: 59

## 2020-11-15 VITALS — BP 120/68 | HR 78 | Temp 97.8°F | Resp 18 | Wt 128.5 lb

## 2020-11-15 DIAGNOSIS — Z5111 Encounter for antineoplastic chemotherapy: Secondary | ICD-10-CM | POA: Diagnosis not present

## 2020-11-15 DIAGNOSIS — C50411 Malignant neoplasm of upper-outer quadrant of right female breast: Secondary | ICD-10-CM | POA: Diagnosis not present

## 2020-11-15 DIAGNOSIS — Z170 Estrogen receptor positive status [ER+]: Secondary | ICD-10-CM

## 2020-11-15 DIAGNOSIS — Z79899 Other long term (current) drug therapy: Secondary | ICD-10-CM | POA: Diagnosis not present

## 2020-11-15 LAB — COMPREHENSIVE METABOLIC PANEL
ALT: 30 U/L (ref 0–44)
AST: 23 U/L (ref 15–41)
Albumin: 4 g/dL (ref 3.5–5.0)
Alkaline Phosphatase: 70 U/L (ref 38–126)
Anion gap: 8 (ref 5–15)
BUN: 18 mg/dL (ref 8–23)
CO2: 27 mmol/L (ref 22–32)
Calcium: 9 mg/dL (ref 8.9–10.3)
Chloride: 100 mmol/L (ref 98–111)
Creatinine, Ser: 0.65 mg/dL (ref 0.44–1.00)
GFR, Estimated: >60 mL/min (ref >60)
Glucose, Bld: 96 mg/dL (ref 70–99)
Potassium: 3.9 mmol/L (ref 3.5–5.1)
Sodium: 135 mmol/L (ref 135–145)
Total Bilirubin: 0.5 mg/dL (ref 0.3–1.2)
Total Protein: 6.9 g/dL (ref 6.5–8.1)

## 2020-11-15 LAB — CBC WITH DIFFERENTIAL/PLATELET
Abs Immature Granulocytes: 0.02 10*3/uL (ref 0.00–0.07)
Basophils Absolute: 0 10*3/uL (ref 0.0–0.1)
Basophils Relative: 0 %
Eosinophils Absolute: 0.2 10*3/uL (ref 0.0–0.5)
Eosinophils Relative: 4 %
HCT: 36.9 % (ref 36.0–46.0)
Hemoglobin: 12.8 g/dL (ref 12.0–15.0)
Immature Granulocytes: 0 %
Lymphocytes Relative: 27 %
Lymphs Abs: 1.2 10*3/uL (ref 0.7–4.0)
MCH: 31.2 pg (ref 26.0–34.0)
MCHC: 34.7 g/dL (ref 30.0–36.0)
MCV: 90 fL (ref 80.0–100.0)
Monocytes Absolute: 0.3 10*3/uL (ref 0.1–1.0)
Monocytes Relative: 7 %
Neutro Abs: 2.8 10*3/uL (ref 1.7–7.7)
Neutrophils Relative %: 62 %
Platelets: 199 10*3/uL (ref 150–400)
RBC: 4.1 MIL/uL (ref 3.87–5.11)
RDW: 11.4 % — ABNORMAL LOW (ref 11.5–15.5)
WBC: 4.5 10*3/uL (ref 4.0–10.5)
nRBC: 0 % (ref 0.0–0.2)

## 2020-11-15 MED ORDER — SODIUM CHLORIDE 0.9 % IV SOLN
10.0000 mg | Freq: Once | INTRAVENOUS | Status: AC
  Administered 2020-11-15: 10 mg via INTRAVENOUS
  Filled 2020-11-15: qty 10

## 2020-11-15 MED ORDER — SODIUM CHLORIDE 0.9 % IV SOLN
80.0000 mg/m2 | Freq: Once | INTRAVENOUS | Status: AC
  Administered 2020-11-15: 126 mg via INTRAVENOUS
  Filled 2020-11-15: qty 21

## 2020-11-15 MED ORDER — DIPHENHYDRAMINE HCL 50 MG/ML IJ SOLN
12.5000 mg | Freq: Once | INTRAMUSCULAR | Status: AC
  Administered 2020-11-15: 12.5 mg via INTRAVENOUS
  Filled 2020-11-15: qty 1

## 2020-11-15 MED ORDER — SODIUM CHLORIDE 0.9 % IV SOLN
Freq: Once | INTRAVENOUS | Status: AC
  Filled 2020-11-15: qty 250

## 2020-11-15 MED ORDER — HEPARIN SOD (PORK) LOCK FLUSH 100 UNIT/ML IV SOLN
500.0000 [IU] | Freq: Once | INTRAVENOUS | Status: AC
  Administered 2020-11-15: 500 [IU] via INTRAVENOUS
  Filled 2020-11-15: qty 5

## 2020-11-15 MED ORDER — SODIUM CHLORIDE 0.9% FLUSH
10.0000 mL | INTRAVENOUS | Status: DC | PRN
  Administered 2020-11-15: 10 mL via INTRAVENOUS
  Filled 2020-11-15: qty 10

## 2020-11-15 MED ORDER — FAMOTIDINE 20 MG IN NS 100 ML IVPB
20.0000 mg | Freq: Once | INTRAVENOUS | Status: AC
  Administered 2020-11-15: 20 mg via INTRAVENOUS
  Filled 2020-11-15: qty 20
  Filled 2020-11-15: qty 100

## 2020-11-15 NOTE — Patient Instructions (Signed)
[facility] [place] [facility]  Discharge Instructions: Thank you for choosing [facility] to provide your oncology and hematology care.  If you have a lab appointment with the Cancer Center, please go directly to the Cancer Center and check in at the registration area.  Wear comfortable clothing and clothing appropriate for easy access to any Portacath or PICC line.   We strive to give you quality time with your provider. You may need to reschedule your appointment if you arrive late (15 or more minutes).  Arriving late affects you and other patients whose appointments are after yours.  Also, if you miss three or more appointments without notifying the office, you may be dismissed from the clinic at the provider's discretion.      For prescription refill requests, have your pharmacy contact our office and allow 72 hours for refills to be completed.      To help prevent nausea and vomiting after your treatment, we encourage you to take your nausea medication as directed.  BELOW ARE SYMPTOMS THAT SHOULD BE REPORTED IMMEDIATELY: *FEVER GREATER THAN 100.4 F (38 C) OR HIGHER *CHILLS OR SWEATING *NAUSEA AND VOMITING THAT IS NOT CONTROLLED WITH YOUR NAUSEA MEDICATION *UNUSUAL SHORTNESS OF BREATH *UNUSUAL BRUISING OR BLEEDING *URINARY PROBLEMS (pain or burning when urinating, or frequent urination) *BOWEL PROBLEMS (unusual diarrhea, constipation, pain near the anus) TENDERNESS IN MOUTH AND THROAT WITH OR WITHOUT PRESENCE OF ULCERS (sore throat, sores in mouth, or a toothache) UNUSUAL RASH, SWELLING OR PAIN  UNUSUAL VAGINAL DISCHARGE OR ITCHING   Items with * indicate a potential emergency and should be followed up as soon as possible or go to the Emergency Department if any problems should occur.  Please show the CHEMOTHERAPY ALERT CARD or IMMUNOTHERAPY ALERT CARD at check-in to the Emergency Department and triage nurse.  Should you have questions after your  visit or need to cancel or reschedule your appointment, please contact [facility] [place] [facility]  [phone] and follow the prompts.  Office hours are 8:00 a.m. to 4:30 p.m. Monday - Friday. Please note that voicemails left after 4:00 p.m. may not be returned until the following business day.  We are closed weekends and major holidays. You have access to a nurse at all times for urgent questions. Please call the main number to the clinic [phone] and follow the prompts.  For any non-urgent questions, you may also contact your provider using MyChart. We now offer e-Visits for anyone 18 and older to request care online for non-urgent symptoms. For details visit [email or web address].   Also download the MyChart app! Go to the app store, search "MyChart", open the app, select [place], and log in with your MyChart username and password.  Due to Covid, a mask is required upon entering the hospital/clinic. If you do not have a mask, one will be given to you upon arrival. For doctor visits, patients may have 1 support person aged 18 or older with them. For treatment visits, patients cannot have anyone with them due to current Covid guidelines and our immunocompromised population.  

## 2020-11-18 ENCOUNTER — Telehealth: Payer: Self-pay

## 2020-11-18 ENCOUNTER — Encounter: Payer: Self-pay | Admitting: Oncology

## 2020-11-19 ENCOUNTER — Telehealth: Payer: Self-pay

## 2020-11-22 ENCOUNTER — Inpatient Hospital Stay: Payer: 59

## 2020-11-22 ENCOUNTER — Inpatient Hospital Stay (HOSPITAL_BASED_OUTPATIENT_CLINIC_OR_DEPARTMENT_OTHER): Payer: 59 | Admitting: Oncology

## 2020-11-22 ENCOUNTER — Encounter: Payer: Self-pay | Admitting: Oncology

## 2020-11-22 VITALS — BP 100/79 | HR 96 | Temp 98.2°F | Resp 16 | Ht 59.0 in | Wt 130.0 lb

## 2020-11-22 DIAGNOSIS — Z95828 Presence of other vascular implants and grafts: Secondary | ICD-10-CM

## 2020-11-22 DIAGNOSIS — C50411 Malignant neoplasm of upper-outer quadrant of right female breast: Secondary | ICD-10-CM | POA: Diagnosis not present

## 2020-11-22 DIAGNOSIS — Z170 Estrogen receptor positive status [ER+]: Secondary | ICD-10-CM | POA: Diagnosis not present

## 2020-11-22 DIAGNOSIS — Z79899 Other long term (current) drug therapy: Secondary | ICD-10-CM | POA: Diagnosis not present

## 2020-11-22 DIAGNOSIS — Z5111 Encounter for antineoplastic chemotherapy: Secondary | ICD-10-CM | POA: Diagnosis not present

## 2020-11-22 LAB — COMPREHENSIVE METABOLIC PANEL
ALT: 29 U/L (ref 0–44)
AST: 20 U/L (ref 15–41)
Albumin: 4.3 g/dL (ref 3.5–5.0)
Alkaline Phosphatase: 78 U/L (ref 38–126)
Anion gap: 9 (ref 5–15)
BUN: 12 mg/dL (ref 8–23)
CO2: 27 mmol/L (ref 22–32)
Calcium: 9 mg/dL (ref 8.9–10.3)
Chloride: 99 mmol/L (ref 98–111)
Creatinine, Ser: 0.74 mg/dL (ref 0.44–1.00)
GFR, Estimated: >60 mL/min (ref >60)
Glucose, Bld: 122 mg/dL — ABNORMAL HIGH (ref 70–99)
Potassium: 3.7 mmol/L (ref 3.5–5.1)
Sodium: 135 mmol/L (ref 135–145)
Total Bilirubin: 0.6 mg/dL (ref 0.3–1.2)
Total Protein: 7.4 g/dL (ref 6.5–8.1)

## 2020-11-22 LAB — CBC WITH DIFFERENTIAL/PLATELET
Abs Immature Granulocytes: 0.01 10*3/uL (ref 0.00–0.07)
Basophils Absolute: 0 10*3/uL (ref 0.0–0.1)
Basophils Relative: 0 %
Eosinophils Absolute: 0.1 10*3/uL (ref 0.0–0.5)
Eosinophils Relative: 3 %
HCT: 33.4 % — ABNORMAL LOW (ref 36.0–46.0)
Hemoglobin: 12 g/dL (ref 12.0–15.0)
Immature Granulocytes: 0 %
Lymphocytes Relative: 31 %
Lymphs Abs: 1.2 10*3/uL (ref 0.7–4.0)
MCH: 32 pg (ref 26.0–34.0)
MCHC: 35.9 g/dL (ref 30.0–36.0)
MCV: 89.1 fL (ref 80.0–100.0)
Monocytes Absolute: 0.5 10*3/uL (ref 0.1–1.0)
Monocytes Relative: 14 %
Neutro Abs: 2 10*3/uL (ref 1.7–7.7)
Neutrophils Relative %: 52 %
Platelets: 173 10*3/uL (ref 150–400)
RBC: 3.75 MIL/uL — ABNORMAL LOW (ref 3.87–5.11)
RDW: 11.6 % (ref 11.5–15.5)
WBC: 3.8 10*3/uL — ABNORMAL LOW (ref 4.0–10.5)
nRBC: 0 % (ref 0.0–0.2)

## 2020-11-22 MED ORDER — HEPARIN SOD (PORK) LOCK FLUSH 100 UNIT/ML IV SOLN
500.0000 [IU] | Freq: Once | INTRAVENOUS | Status: DC
  Administered 2020-11-22: 500 [IU] via INTRAVENOUS
  Filled 2020-11-22: qty 5

## 2020-11-22 MED ORDER — DIPHENHYDRAMINE HCL 50 MG/ML IJ SOLN
12.5000 mg | Freq: Once | INTRAMUSCULAR | Status: AC
  Administered 2020-11-22: 12.5 mg via INTRAVENOUS
  Filled 2020-11-22: qty 1

## 2020-11-22 MED ORDER — FAMOTIDINE 20 MG IN NS 100 ML IVPB
20.0000 mg | Freq: Once | INTRAVENOUS | Status: AC
  Administered 2020-11-22: 20 mg via INTRAVENOUS
  Filled 2020-11-22: qty 20

## 2020-11-22 MED ORDER — SODIUM CHLORIDE 0.9 % IV SOLN
80.0000 mg/m2 | Freq: Once | INTRAVENOUS | Status: AC
  Administered 2020-11-22: 126 mg via INTRAVENOUS
  Filled 2020-11-22: qty 21

## 2020-11-22 MED ORDER — SODIUM CHLORIDE 0.9 % IV SOLN
Freq: Once | INTRAVENOUS | Status: AC
  Filled 2020-11-22: qty 250

## 2020-11-22 MED ORDER — SODIUM CHLORIDE 0.9% FLUSH
10.0000 mL | Freq: Once | INTRAVENOUS | Status: AC
  Administered 2020-11-22: 10 mL via INTRAVENOUS
  Filled 2020-11-22: qty 10

## 2020-11-22 MED ORDER — SODIUM CHLORIDE 0.9 % IV SOLN
10.0000 mg | Freq: Once | INTRAVENOUS | Status: AC
  Administered 2020-11-22: 10 mg via INTRAVENOUS
  Filled 2020-11-22: qty 10

## 2020-11-22 NOTE — Patient Instructions (Signed)
[facility]  Discharge Instructions: Thank you for choosing [facility] to provide your oncology and hematology care.  If you have a lab appointment with the [facility], please go directly to the [facility] and check in at the registration area.  Wear comfortable clothing and clothing appropriate for easy access to any Portacath or PICC line.   We strive to give you quality time with your provider. You may need to reschedule your appointment if you arrive late (15 or more minutes).  Arriving late affects you and other patients whose appointments are after yours.  Also, if you miss three or more appointments without notifying the office, you may be dismissed from the clinic at the provider's discretion.      For prescription refill requests, have your pharmacy contact our office and allow 72 hours for refills to be completed.    Today you received the following chemotherapy and/or immunotherapy agents TAXOL      To help prevent nausea and vomiting after your treatment, we encourage you to take your nausea medication as directed.  BELOW ARE SYMPTOMS THAT SHOULD BE REPORTED IMMEDIATELY: *FEVER GREATER THAN 100.4 F (38 C) OR HIGHER *CHILLS OR SWEATING *NAUSEA AND VOMITING THAT IS NOT CONTROLLED WITH YOUR NAUSEA MEDICATION *UNUSUAL SHORTNESS OF BREATH *UNUSUAL BRUISING OR BLEEDING *URINARY PROBLEMS (pain or burning when urinating, or frequent urination) *BOWEL PROBLEMS (unusual diarrhea, constipation, pain near the anus) TENDERNESS IN MOUTH AND THROAT WITH OR WITHOUT PRESENCE OF ULCERS (sore throat, sores in mouth, or a toothache) UNUSUAL RASH, SWELLING OR PAIN  UNUSUAL VAGINAL DISCHARGE OR ITCHING   Items with * indicate a potential emergency and should be followed up as soon as possible or go to the Emergency Department if any problems should occur.  Please show the CHEMOTHERAPY ALERT CARD or IMMUNOTHERAPY ALERT CARD at check-in to  the Emergency Department and triage nurse.  Should you have questions after your visit or need to cancel or reschedule your appointment, please contact [facility]  [phone] and follow the prompts.  Office hours are 8:00 a.m. to 4:30 p.m. Monday - Friday. Please note that voicemails left after 4:00 p.m. may not be returned until the following business day.  We are closed weekends and major holidays. You have access to a nurse at all times for urgent questions. Please call the main number to the clinic [phone] and follow the prompts.  For any non-urgent questions, you may also contact your provider using MyChart. We now offer e-Visits for anyone 43 and older to request care online for non-urgent symptoms. For details visit [email or web address].   Also download the MyChart app! Go to the app store, search "MyChart", open the app, select [place], and log in with your MyChart username and password.  Due to Covid, a mask is required upon entering the hospital/clinic. If you do not have a mask, one will be given to you upon arrival. For doctor visits, patients may have 1 support person aged 23 or older with them. For treatment visits, patients cannot have anyone with them due to current Covid guidelines and our immunocompromised population.   Paclitaxel injection What is this medication? PACLITAXEL (PAK li TAX el) is a chemotherapy drug. It targets fast dividing cells, like cancer cells, and causes these cells to die. This medicine is used to treat ovarian cancer, breast cancer, lung cancer, Kaposi's sarcoma, and other cancers. This medicine may be used for other purposes; ask  your health care provider or pharmacist if you have questions. COMMON BRAND NAME(S): Onxol, Taxol What should I tell my care team before I take this medication? They need to know if you have any of these conditions: history of irregular heartbeat liver disease low blood counts,  like low white cell, platelet, or red cell counts lung or breathing disease, like asthma tingling of the fingers or toes, or other nerve disorder an unusual or allergic reaction to paclitaxel, alcohol, polyoxyethylated castor oil, other chemotherapy, other medicines, foods, dyes, or preservatives pregnant or trying to get pregnant breast-feeding How should I use this medication? This drug is given as an infusion into a vein. It is administered in a hospital or clinic by a specially trained health care professional. Talk to your pediatrician regarding the use of this medicine in children. Special care may be needed. Overdosage: If you think you have taken too much of this medicine contact a poison control center or emergency room at once. NOTE: This medicine is only for you. Do not share this medicine with others. What if I miss a dose? It is important not to miss your dose. Call your doctor or health care professional if you are unable to keep an appointment. What may interact with this medication? Do not take this medicine with any of the following medications: live virus vaccines This medicine may also interact with the following medications: antiviral medicines for hepatitis, HIV or AIDS certain antibiotics like erythromycin and clarithromycin certain medicines for fungal infections like ketoconazole and itraconazole certain medicines for seizures like carbamazepine, phenobarbital, phenytoin gemfibrozil nefazodone rifampin St. John's wort This list may not describe all possible interactions. Give your health care provider a list of all the medicines, herbs, non-prescription drugs, or dietary supplements you use. Also tell them if you smoke, drink alcohol, or use illegal drugs. Some items may interact with your medicine. What should I watch for while using this medication? Your condition will be monitored carefully while you are receiving this medicine. You will need important blood work  done while you are taking this medicine. This medicine can cause serious allergic reactions. To reduce your risk you will need to take other medicine(s) before treatment with this medicine. If you experience allergic reactions like skin rash, itching or hives, swelling of the face, lips, or tongue, tell your doctor or health care professional right away. In some cases, you may be given additional medicines to help with side effects. Follow all directions for their use. This drug may make you feel generally unwell. This is not uncommon, as chemotherapy can affect healthy cells as well as cancer cells. Report any side effects. Continue your course of treatment even though you feel ill unless your doctor tells you to stop. Call your doctor or health care professional for advice if you get a fever, chills or sore throat, or other symptoms of a cold or flu. Do not treat yourself. This drug decreases your body's ability to fight infections. Try to avoid being around people who are sick. This medicine may increase your risk to bruise or bleed. Call your doctor or health care professional if you notice any unusual bleeding. Be careful brushing and flossing your teeth or using a toothpick because you may get an infection or bleed more easily. If you have any dental work done, tell your dentist you are receiving this medicine. Avoid taking products that contain aspirin, acetaminophen, ibuprofen, naproxen, or ketoprofen unless instructed by your doctor. These medicines may hide a  fever. Do not become pregnant while taking this medicine. Women should inform their doctor if they wish to become pregnant or think they might be pregnant. There is a potential for serious side effects to an unborn child. Talk to your health care professional or pharmacist for more information. Do not breast-feed an infant while taking this medicine. Men are advised not to father a child while receiving this medicine. This product may  contain alcohol. Ask your pharmacist or healthcare provider if this medicine contains alcohol. Be sure to tell all healthcare providers you are taking this medicine. Certain medicines, like metronidazole and disulfiram, can cause an unpleasant reaction when taken with alcohol. The reaction includes flushing, headache, nausea, vomiting, sweating, and increased thirst. The reaction can last from 30 minutes to several hours. What side effects may I notice from receiving this medication? Side effects that you should report to your doctor or health care professional as soon as possible: allergic reactions like skin rash, itching or hives, swelling of the face, lips, or tongue breathing problems changes in vision fast, irregular heartbeat high or low blood pressure mouth sores pain, tingling, numbness in the hands or feet signs of decreased platelets or bleeding - bruising, pinpoint red spots on the skin, black, tarry stools, blood in the urine signs of decreased red blood cells - unusually weak or tired, feeling faint or lightheaded, falls signs of infection - fever or chills, cough, sore throat, pain or difficulty passing urine signs and symptoms of liver injury like dark yellow or brown urine; general ill feeling or flu-like symptoms; light-colored stools; loss of appetite; nausea; right upper belly pain; unusually weak or tired; yellowing of the eyes or skin swelling of the ankles, feet, hands unusually slow heartbeat Side effects that usually do not require medical attention (report to your doctor or health care professional if they continue or are bothersome): diarrhea hair loss loss of appetite muscle or joint pain nausea, vomiting pain, redness, or irritation at site where injected tiredness This list may not describe all possible side effects. Call your doctor for medical advice about side effects. You may report side effects to FDA at 1-800-FDA-1088. Where should I keep my  medication? This drug is given in a hospital or clinic and will not be stored at home. NOTE: This sheet is a summary. It may not cover all possible information. If you have questions about this medicine, talk to your doctor, pharmacist, or health care provider.  2022 Elsevier/Gold Standard (2019-01-11 13:37:23)

## 2020-11-22 NOTE — Progress Notes (Signed)
Pt here and she says that she is having left ear pain started this morning. She could not sleep at al the day of chemo because os steroid, swimmy headed a little bit couple of days only

## 2020-11-24 ENCOUNTER — Encounter: Payer: Self-pay | Admitting: Oncology
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
## 2020-11-25 ENCOUNTER — Other Ambulatory Visit: Payer: Self-pay

## 2020-11-25 ENCOUNTER — Inpatient Hospital Stay: Payer: 59 | Attending: Oncology

## 2020-11-25 DIAGNOSIS — Z5112 Encounter for antineoplastic immunotherapy: Secondary | ICD-10-CM | POA: Insufficient documentation

## 2020-11-25 DIAGNOSIS — Z79899 Other long term (current) drug therapy: Secondary | ICD-10-CM | POA: Diagnosis not present

## 2020-11-25 DIAGNOSIS — Z5111 Encounter for antineoplastic chemotherapy: Secondary | ICD-10-CM | POA: Diagnosis not present

## 2020-11-25 DIAGNOSIS — Z23 Encounter for immunization: Secondary | ICD-10-CM | POA: Diagnosis not present

## 2020-11-25 DIAGNOSIS — Z170 Estrogen receptor positive status [ER+]: Secondary | ICD-10-CM

## 2020-11-25 DIAGNOSIS — Z5189 Encounter for other specified aftercare: Secondary | ICD-10-CM | POA: Diagnosis not present

## 2020-11-25 DIAGNOSIS — C50411 Malignant neoplasm of upper-outer quadrant of right female breast: Secondary | ICD-10-CM | POA: Diagnosis not present

## 2020-11-25 MED ORDER — FILGRASTIM-SNDZ 300 MCG/0.5ML IJ SOSY
300.0000 ug | PREFILLED_SYRINGE | Freq: Once | INTRAMUSCULAR | Status: AC
  Administered 2020-11-25: 300 ug via SUBCUTANEOUS
  Filled 2020-11-25: qty 0.5

## 2020-11-26 ENCOUNTER — Inpatient Hospital Stay: Payer: 59

## 2020-11-26 DIAGNOSIS — C50411 Malignant neoplasm of upper-outer quadrant of right female breast: Secondary | ICD-10-CM

## 2020-11-26 DIAGNOSIS — Z5112 Encounter for antineoplastic immunotherapy: Secondary | ICD-10-CM | POA: Diagnosis not present

## 2020-11-26 DIAGNOSIS — Z5189 Encounter for other specified aftercare: Secondary | ICD-10-CM | POA: Diagnosis not present

## 2020-11-26 DIAGNOSIS — Z170 Estrogen receptor positive status [ER+]: Secondary | ICD-10-CM

## 2020-11-26 DIAGNOSIS — Z23 Encounter for immunization: Secondary | ICD-10-CM | POA: Diagnosis not present

## 2020-11-26 DIAGNOSIS — Z79899 Other long term (current) drug therapy: Secondary | ICD-10-CM | POA: Diagnosis not present

## 2020-11-26 DIAGNOSIS — Z5111 Encounter for antineoplastic chemotherapy: Secondary | ICD-10-CM | POA: Diagnosis not present

## 2020-11-26 MED ORDER — FILGRASTIM-SNDZ 300 MCG/0.5ML IJ SOSY
300.0000 ug | PREFILLED_SYRINGE | Freq: Once | INTRAMUSCULAR | Status: AC
  Administered 2020-11-26: 300 ug via SUBCUTANEOUS
  Filled 2020-11-26: qty 0.5

## 2020-11-27 ENCOUNTER — Encounter: Payer: Self-pay | Admitting: Licensed Clinical Social Worker

## 2020-11-27 ENCOUNTER — Inpatient Hospital Stay (HOSPITAL_BASED_OUTPATIENT_CLINIC_OR_DEPARTMENT_OTHER): Payer: 59 | Admitting: Licensed Clinical Social Worker

## 2020-11-27 ENCOUNTER — Inpatient Hospital Stay: Payer: 59

## 2020-11-27 DIAGNOSIS — C50411 Malignant neoplasm of upper-outer quadrant of right female breast: Secondary | ICD-10-CM

## 2020-11-27 DIAGNOSIS — Z170 Estrogen receptor positive status [ER+]: Secondary | ICD-10-CM | POA: Diagnosis not present

## 2020-11-27 DIAGNOSIS — Z808 Family history of malignant neoplasm of other organs or systems: Secondary | ICD-10-CM
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
## 2020-11-29 ENCOUNTER — Inpatient Hospital Stay: Payer: 59

## 2020-11-29 ENCOUNTER — Other Ambulatory Visit: Payer: Self-pay

## 2020-11-29 ENCOUNTER — Other Ambulatory Visit: Payer: Self-pay | Admitting: Oncology

## 2020-11-29 VITALS — BP 127/73 | HR 92 | Temp 97.6°F | Resp 17 | Wt 130.4 lb

## 2020-11-29 DIAGNOSIS — Z5111 Encounter for antineoplastic chemotherapy: Secondary | ICD-10-CM | POA: Diagnosis not present

## 2020-11-29 DIAGNOSIS — C50411 Malignant neoplasm of upper-outer quadrant of right female breast: Secondary | ICD-10-CM

## 2020-11-29 DIAGNOSIS — Z5189 Encounter for other specified aftercare: Secondary | ICD-10-CM | POA: Diagnosis not present

## 2020-11-29 DIAGNOSIS — Z170 Estrogen receptor positive status [ER+]: Secondary | ICD-10-CM

## 2020-11-29 DIAGNOSIS — M545 Low back pain, unspecified: Secondary | ICD-10-CM

## 2020-11-29 DIAGNOSIS — Z79899 Other long term (current) drug therapy: Secondary | ICD-10-CM | POA: Diagnosis not present

## 2020-11-29 DIAGNOSIS — Z23 Encounter for immunization: Secondary | ICD-10-CM | POA: Diagnosis not present

## 2020-11-29 DIAGNOSIS — Z5112 Encounter for antineoplastic immunotherapy: Secondary | ICD-10-CM | POA: Diagnosis not present

## 2020-11-29 LAB — COMPREHENSIVE METABOLIC PANEL
ALT: 21 U/L (ref 0–44)
AST: 22 U/L (ref 15–41)
Albumin: 4 g/dL (ref 3.5–5.0)
Alkaline Phosphatase: 97 U/L (ref 38–126)
Anion gap: 8 (ref 5–15)
BUN: 21 mg/dL (ref 8–23)
CO2: 25 mmol/L (ref 22–32)
Calcium: 9 mg/dL (ref 8.9–10.3)
Chloride: 103 mmol/L (ref 98–111)
Creatinine, Ser: 0.73 mg/dL (ref 0.44–1.00)
GFR, Estimated: >60 mL/min (ref >60)
Glucose, Bld: 128 mg/dL — ABNORMAL HIGH (ref 70–99)
Potassium: 3.7 mmol/L (ref 3.5–5.1)
Sodium: 136 mmol/L (ref 135–145)
Total Bilirubin: 0.2 mg/dL — ABNORMAL LOW (ref 0.3–1.2)
Total Protein: 7.1 g/dL (ref 6.5–8.1)

## 2020-11-29 LAB — CBC WITH DIFFERENTIAL/PLATELET
Abs Immature Granulocytes: 0.6 10*3/uL — ABNORMAL HIGH (ref 0.00–0.07)
Basophils Absolute: 0.1 10*3/uL (ref 0.0–0.1)
Basophils Relative: 1 %
Eosinophils Absolute: 0.1 10*3/uL (ref 0.0–0.5)
Eosinophils Relative: 1 %
HCT: 34.3 % — ABNORMAL LOW (ref 36.0–46.0)
Hemoglobin: 11.9 g/dL — ABNORMAL LOW (ref 12.0–15.0)
Immature Granulocytes: 6 %
Lymphocytes Relative: 23 %
Lymphs Abs: 2.4 10*3/uL (ref 0.7–4.0)
MCH: 31.6 pg (ref 26.0–34.0)
MCHC: 34.7 g/dL (ref 30.0–36.0)
MCV: 91.2 fL (ref 80.0–100.0)
Monocytes Absolute: 0.6 10*3/uL (ref 0.1–1.0)
Monocytes Relative: 5 %
Neutro Abs: 6.8 10*3/uL (ref 1.7–7.7)
Neutrophils Relative %: 64 %
Platelets: 311 10*3/uL (ref 150–400)
RBC: 3.76 MIL/uL — ABNORMAL LOW (ref 3.87–5.11)
RDW: 12.9 % (ref 11.5–15.5)
WBC: 10.6 10*3/uL — ABNORMAL HIGH (ref 4.0–10.5)
nRBC: 0 % (ref 0.0–0.2)

## 2020-11-29 LAB — URINALYSIS, COMPLETE (UACMP) WITH MICROSCOPIC
Bacteria, UA: NONE SEEN
Bilirubin Urine: NEGATIVE
Glucose, UA: NEGATIVE mg/dL
Ketones, ur: NEGATIVE mg/dL
Leukocytes,Ua: NEGATIVE
Nitrite: NEGATIVE
Protein, ur: NEGATIVE mg/dL
Specific Gravity, Urine: 1.014 (ref 1.005–1.030)
pH: 5 (ref 5.0–8.0)

## 2020-11-29 MED ORDER — HEPARIN SOD (PORK) LOCK FLUSH 100 UNIT/ML IV SOLN
500.0000 [IU] | Freq: Once | INTRAVENOUS | Status: AC
  Filled 2020-11-29: qty 5

## 2020-11-29 MED ORDER — DIPHENHYDRAMINE HCL 50 MG/ML IJ SOLN
12.5000 mg | Freq: Once | INTRAMUSCULAR | Status: AC
Start: 2020-11-29 — End: 2020-11-29
  Administered 2020-11-29: 12.5 mg via INTRAVENOUS
  Filled 2020-11-29: qty 1

## 2020-11-29 MED ORDER — FAMOTIDINE 20 MG IN NS 100 ML IVPB
20.0000 mg | Freq: Once | INTRAVENOUS | Status: AC
Start: 2020-11-29 — End: 2020-11-29
  Administered 2020-11-29: 20 mg via INTRAVENOUS
  Filled 2020-11-29: qty 20

## 2020-11-29 MED ORDER — HEPARIN SOD (PORK) LOCK FLUSH 100 UNIT/ML IV SOLN
INTRAVENOUS | Status: AC
  Administered 2020-11-29: 500 [IU] via INTRAVENOUS
  Filled 2020-11-29: qty 5

## 2020-11-29 MED ORDER — HEPARIN SOD (PORK) LOCK FLUSH 100 UNIT/ML IV SOLN
500.0000 [IU] | Freq: Once | INTRAVENOUS | Status: DC | PRN
  Filled 2020-11-29: qty 5

## 2020-11-29 MED ORDER — SODIUM CHLORIDE 0.9 % IV SOLN
Freq: Once | INTRAVENOUS | Status: AC
  Filled 2020-11-29: qty 250

## 2020-11-29 MED ORDER — PALONOSETRON HCL INJECTION 0.25 MG/5ML
0.2500 mg | Freq: Once | INTRAVENOUS | Status: AC
  Administered 2020-11-29: 0.25 mg via INTRAVENOUS
  Filled 2020-11-29: qty 5

## 2020-11-29 MED ORDER — SODIUM CHLORIDE 0.9 % IV SOLN
150.0000 mg | Freq: Once | INTRAVENOUS | Status: AC
  Administered 2020-11-29: 150 mg via INTRAVENOUS
  Filled 2020-11-29: qty 150

## 2020-11-29 MED ORDER — SODIUM CHLORIDE 0.9 % IV SOLN
450.0000 mg | Freq: Once | INTRAVENOUS | Status: AC
  Administered 2020-11-29: 450 mg via INTRAVENOUS
  Filled 2020-11-29: qty 45

## 2020-11-29 MED ORDER — SODIUM CHLORIDE 0.9 % IV SOLN
80.0000 mg/m2 | Freq: Once | INTRAVENOUS | Status: AC
  Administered 2020-11-29: 126 mg via INTRAVENOUS
  Filled 2020-11-29: qty 21

## 2020-11-29 MED ORDER — SODIUM CHLORIDE 0.9 % IV SOLN
10.0000 mg | Freq: Once | INTRAVENOUS | Status: AC
  Administered 2020-11-29: 10 mg via INTRAVENOUS
  Filled 2020-11-29: qty 10

## 2020-11-29 MED ORDER — SODIUM CHLORIDE 0.9% FLUSH
10.0000 mL | INTRAVENOUS | Status: DC | PRN
  Administered 2020-11-29: 10 mL via INTRAVENOUS
  Filled 2020-11-29: qty 10

## 2020-11-29 MED ORDER — SODIUM CHLORIDE 0.9 % IV SOLN
200.0000 mg | Freq: Once | INTRAVENOUS | Status: AC
  Administered 2020-11-29: 200 mg via INTRAVENOUS
  Filled 2020-11-29: qty 8

## 2020-11-29 NOTE — Patient Instructions (Signed)
[facility]  Discharge Instructions: Thank you for choosing [facility] to provide your oncology and hematology care.  If you have a lab appointment with the [facility], please go directly to the [facility] and check in at the registration area.  Wear comfortable clothing and clothing appropriate for easy access to any Portacath or PICC line.   We strive to give you quality time with your provider. You may need to reschedule your appointment if you arrive late (15 or more minutes).  Arriving late affects you and other patients whose appointments are after yours.  Also, if you miss three or more appointments without notifying the office, you may be dismissed from the clinic at the provider's discretion.      For prescription refill requests, have your pharmacy contact our office and allow 72 hours for refills to be completed.    Today you received the following chemotherapy and/or immunotherapy agents Keytruda, Taxol and Carboplatin        To help prevent nausea and vomiting after your treatment, we encourage you to take your nausea medication as directed.  BELOW ARE SYMPTOMS THAT SHOULD BE REPORTED IMMEDIATELY: *FEVER GREATER THAN 100.4 F (38 C) OR HIGHER *CHILLS OR SWEATING *NAUSEA AND VOMITING THAT IS NOT CONTROLLED WITH YOUR NAUSEA MEDICATION *UNUSUAL SHORTNESS OF BREATH *UNUSUAL BRUISING OR BLEEDING *URINARY PROBLEMS (pain or burning when urinating, or frequent urination) *BOWEL PROBLEMS (unusual diarrhea, constipation, pain near the anus) TENDERNESS IN MOUTH AND THROAT WITH OR WITHOUT PRESENCE OF ULCERS (sore throat, sores in mouth, or a toothache) UNUSUAL RASH, SWELLING OR PAIN  UNUSUAL VAGINAL DISCHARGE OR ITCHING   Items with * indicate a potential emergency and should be followed up as soon as possible or go to the Emergency Department if any problems should occur.  Please show the CHEMOTHERAPY ALERT CARD or IMMUNOTHERAPY  ALERT CARD at check-in to the Emergency Department and triage nurse.  Should you have questions after your visit or need to cancel or reschedule your appointment, please contact [facility]  [phone] and follow the prompts.  Office hours are 8:00 a.m. to 4:30 p.m. Monday - Friday. Please note that voicemails left after 4:00 p.m. may not be returned until the following business day.  We are closed weekends and major holidays. You have access to a nurse at all times for urgent questions. Please call the main number to the clinic [phone] and follow the prompts.  For any non-urgent questions, you may also contact your provider using MyChart. We now offer e-Visits for anyone 23 and older to request care online for non-urgent symptoms. For details visit [email or web address].   Also download the MyChart app! Go to the app store, search "MyChart", open the app, select [facility], and log in with your MyChart username and password.  Due to Covid, a mask is required upon entering the hospital/clinic. If you do not have a mask, one will be given to you upon arrival. For doctor visits, patients may have 1 support person aged 1 or older with them. For treatment visits, patients cannot have anyone with them due to current Covid guidelines and our immunocompromised population.

## 2020-11-29 NOTE — Progress Notes (Signed)
pt reports that since Weds. she has been experiencing intermittent mid/lower back pain.  She said this happened a year ago and was treated for a UTI. Per [name] pt to give urine sample.  4458: CBC and CMP within parameters, pt has not given urine sample at this time. Per [name] okay to proceed with treatment.     1200: [name] aware of UA results. (Small hgb noted) Per [name] no new orders at this time, once urine culture results, if abnormal [name] will notify pt. Pt aware and verbalizes understanding and agrees with plan. Pt verbalizes understanding to call if symptoms worsens or continues.   1410: Pt tolerated infusion well. Pt stable at discharge.

## 2020-12-01 LAB — URINE CULTURE: Culture: 30000 — AB

## 2020-12-02 ENCOUNTER — Inpatient Hospital Stay: Payer: 59

## 2020-12-02 ENCOUNTER — Other Ambulatory Visit: Payer: Self-pay

## 2020-12-03 ENCOUNTER — Inpatient Hospital Stay: Payer: 59

## 2020-12-06 ENCOUNTER — Inpatient Hospital Stay: Payer: 59

## 2020-12-06 ENCOUNTER — Inpatient Hospital Stay (HOSPITAL_BASED_OUTPATIENT_CLINIC_OR_DEPARTMENT_OTHER): Payer: 59 | Admitting: Oncology

## 2020-12-06 ENCOUNTER — Other Ambulatory Visit: Payer: Self-pay

## 2020-12-06 ENCOUNTER — Encounter: Payer: Self-pay | Admitting: Oncology

## 2020-12-06 VITALS — BP 125/74 | HR 95 | Temp 97.4°F | Resp 17 | Wt 130.0 lb

## 2020-12-06 DIAGNOSIS — Z79899 Other long term (current) drug therapy: Secondary | ICD-10-CM | POA: Diagnosis not present

## 2020-12-06 DIAGNOSIS — Z5111 Encounter for antineoplastic chemotherapy: Secondary | ICD-10-CM

## 2020-12-06 DIAGNOSIS — C50411 Malignant neoplasm of upper-outer quadrant of right female breast: Secondary | ICD-10-CM | POA: Diagnosis not present

## 2020-12-06 DIAGNOSIS — Z170 Estrogen receptor positive status [ER+]: Secondary | ICD-10-CM

## 2020-12-06 DIAGNOSIS — Z95828 Presence of other vascular implants and grafts: Secondary | ICD-10-CM

## 2020-12-06 DIAGNOSIS — Z23 Encounter for immunization: Secondary | ICD-10-CM | POA: Diagnosis not present

## 2020-12-06 DIAGNOSIS — Z5189 Encounter for other specified aftercare: Secondary | ICD-10-CM | POA: Diagnosis not present

## 2020-12-06 DIAGNOSIS — Z5112 Encounter for antineoplastic immunotherapy: Secondary | ICD-10-CM | POA: Diagnosis not present

## 2020-12-06 LAB — CBC WITH DIFFERENTIAL/PLATELET
Abs Immature Granulocytes: 0.01 10*3/uL (ref 0.00–0.07)
Basophils Absolute: 0 10*3/uL (ref 0.0–0.1)
Basophils Relative: 1 %
Eosinophils Absolute: 0 10*3/uL (ref 0.0–0.5)
Eosinophils Relative: 1 %
HCT: 33.6 % — ABNORMAL LOW (ref 36.0–46.0)
Hemoglobin: 11.8 g/dL — ABNORMAL LOW (ref 12.0–15.0)
Immature Granulocytes: 0 %
Lymphocytes Relative: 37 %
Lymphs Abs: 1.4 10*3/uL (ref 0.7–4.0)
MCH: 31.6 pg (ref 26.0–34.0)
MCHC: 35.1 g/dL (ref 30.0–36.0)
MCV: 90.1 fL (ref 80.0–100.0)
Monocytes Absolute: 0.2 10*3/uL (ref 0.1–1.0)
Monocytes Relative: 5 %
Neutro Abs: 2.2 10*3/uL (ref 1.7–7.7)
Neutrophils Relative %: 56 %
Platelets: 191 10*3/uL (ref 150–400)
RBC: 3.73 MIL/uL — ABNORMAL LOW (ref 3.87–5.11)
RDW: 12.8 % (ref 11.5–15.5)
WBC: 3.9 10*3/uL — ABNORMAL LOW (ref 4.0–10.5)
nRBC: 0 % (ref 0.0–0.2)

## 2020-12-06 LAB — COMPREHENSIVE METABOLIC PANEL
ALT: 48 U/L — ABNORMAL HIGH (ref 0–44)
AST: 32 U/L (ref 15–41)
Albumin: 4 g/dL (ref 3.5–5.0)
Alkaline Phosphatase: 80 U/L (ref 38–126)
Anion gap: 8 (ref 5–15)
BUN: 13 mg/dL (ref 8–23)
CO2: 27 mmol/L (ref 22–32)
Calcium: 9.1 mg/dL (ref 8.9–10.3)
Chloride: 100 mmol/L (ref 98–111)
Creatinine, Ser: 0.69 mg/dL (ref 0.44–1.00)
GFR, Estimated: >60 mL/min (ref >60)
Glucose, Bld: 124 mg/dL — ABNORMAL HIGH (ref 70–99)
Potassium: 3.6 mmol/L (ref 3.5–5.1)
Sodium: 135 mmol/L (ref 135–145)
Total Bilirubin: 0.5 mg/dL (ref 0.3–1.2)
Total Protein: 7 g/dL (ref 6.5–8.1)

## 2020-12-06 MED ORDER — SODIUM CHLORIDE 0.9 % IV SOLN
65.0000 mg/m2 | Freq: Once | INTRAVENOUS | Status: AC
  Administered 2020-12-06: 102 mg via INTRAVENOUS
  Filled 2020-12-06: qty 17

## 2020-12-06 MED ORDER — DIPHENHYDRAMINE HCL 50 MG/ML IJ SOLN
12.5000 mg | Freq: Once | INTRAMUSCULAR | Status: AC
Start: 2020-12-06 — End: 2020-12-06
  Administered 2020-12-06: 12.5 mg via INTRAVENOUS

## 2020-12-06 MED ORDER — FAMOTIDINE 20 MG IN NS 100 ML IVPB
20.0000 mg | Freq: Once | INTRAVENOUS | Status: AC
  Administered 2020-12-06: 20 mg via INTRAVENOUS
  Filled 2020-12-06: qty 20

## 2020-12-06 MED ORDER — HEPARIN SOD (PORK) LOCK FLUSH 100 UNIT/ML IV SOLN
500.0000 [IU] | Freq: Once | INTRAVENOUS | Status: AC
  Administered 2020-12-06: 500 [IU] via INTRAVENOUS
  Filled 2020-12-06: qty 5

## 2020-12-06 MED ORDER — SODIUM CHLORIDE 0.9% FLUSH
10.0000 mL | Freq: Once | INTRAVENOUS | Status: AC
  Administered 2020-12-06: 10 mL via INTRAVENOUS
  Filled 2020-12-06: qty 10

## 2020-12-06 MED ORDER — SODIUM CHLORIDE 0.9 % IV SOLN
Freq: Once | INTRAVENOUS | Status: AC
  Filled 2020-12-06: qty 250

## 2020-12-06 MED ORDER — SODIUM CHLORIDE 0.9 % IV SOLN
10.0000 mg | Freq: Once | INTRAVENOUS | Status: AC
  Administered 2020-12-06: 10 mg via INTRAVENOUS
  Filled 2020-12-06: qty 10

## 2020-12-06 MED ORDER — HEPARIN SOD (PORK) LOCK FLUSH 100 UNIT/ML IV SOLN
INTRAVENOUS | Status: AC
  Filled 2020-12-06: qty 5

## 2020-12-06 NOTE — Progress Notes (Signed)
Patient here for oncology follow-up appointment, concerns of loss of feeling in feet and nausea

## 2020-12-06 NOTE — Patient Instructions (Signed)
[facility]  Discharge Instructions: Thank you for choosing [facility] to provide your oncology and hematology care.  If you have a lab appointment with the [facility], please go directly to the [facility] and check in at the registration area.  Wear comfortable clothing and clothing appropriate for easy access to any Portacath or PICC line.   We strive to give you quality time with your provider. You may need to reschedule your appointment if you arrive late (15 or more minutes).  Arriving late affects you and other patients whose appointments are after yours.  Also, if you miss three or more appointments without notifying the office, you may be dismissed from the clinic at the provider's discretion.      For prescription refill requests, have your pharmacy contact our office and allow 72 hours for refills to be completed.    Today you received the following chemotherapy and/or immunotherapy agents : Taxol    To help prevent nausea and vomiting after your treatment, we encourage you to take your nausea medication as directed.  BELOW ARE SYMPTOMS THAT SHOULD BE REPORTED IMMEDIATELY: *FEVER GREATER THAN 100.4 F (38 C) OR HIGHER *CHILLS OR SWEATING *NAUSEA AND VOMITING THAT IS NOT CONTROLLED WITH YOUR NAUSEA MEDICATION *UNUSUAL SHORTNESS OF BREATH *UNUSUAL BRUISING OR BLEEDING *URINARY PROBLEMS (pain or burning when urinating, or frequent urination) *BOWEL PROBLEMS (unusual diarrhea, constipation, pain near the anus) TENDERNESS IN MOUTH AND THROAT WITH OR WITHOUT PRESENCE OF ULCERS (sore throat, sores in mouth, or a toothache) UNUSUAL RASH, SWELLING OR PAIN  UNUSUAL VAGINAL DISCHARGE OR ITCHING   Items with * indicate a potential emergency and should be followed up as soon as possible or go to the Emergency Department if any problems should occur.  Please show the CHEMOTHERAPY ALERT CARD or IMMUNOTHERAPY ALERT CARD at check-in to  the Emergency Department and triage nurse.  Should you have questions after your visit or need to cancel or reschedule your appointment, please contact [facility]  [phone] and follow the prompts.  Office hours are 8:00 a.m. to 4:30 p.m. Monday - Friday. Please note that voicemails left after 4:00 p.m. may not be returned until the following business day.  We are closed weekends and major holidays. You have access to a nurse at all times for urgent questions. Please call the main number to the clinic [phone] and follow the prompts.  For any non-urgent questions, you may also contact your provider using MyChart. We now offer e-Visits for anyone 61 and older to request care online for non-urgent symptoms. For details visit [email or web address].   Also download the MyChart app! Go to the app store, search "MyChart", open the app, select [facility], and log in with your MyChart username and password.  Due to Covid, a mask is required upon entering the hospital/clinic. If you do not have a mask, one will be given to you upon arrival. For doctor visits, patients may have 1 support person aged 61 or older with them. For treatment visits, patients cannot have anyone with them due to current Covid guidelines and our immunocompromised population.

## 2020-12-09 ENCOUNTER — Inpatient Hospital Stay: Payer: 59

## 2020-12-09 ENCOUNTER — Other Ambulatory Visit: Payer: Self-pay

## 2020-12-09 DIAGNOSIS — Z79899 Other long term (current) drug therapy: Secondary | ICD-10-CM | POA: Diagnosis not present

## 2020-12-09 DIAGNOSIS — Z5111 Encounter for antineoplastic chemotherapy: Secondary | ICD-10-CM | POA: Diagnosis not present

## 2020-12-09 DIAGNOSIS — Z170 Estrogen receptor positive status [ER+]: Secondary | ICD-10-CM

## 2020-12-09 DIAGNOSIS — C50411 Malignant neoplasm of upper-outer quadrant of right female breast: Secondary | ICD-10-CM | POA: Diagnosis not present

## 2020-12-09 DIAGNOSIS — Z5112 Encounter for antineoplastic immunotherapy: Secondary | ICD-10-CM | POA: Diagnosis not present

## 2020-12-09 DIAGNOSIS — Z5189 Encounter for other specified aftercare: Secondary | ICD-10-CM | POA: Diagnosis not present

## 2020-12-09 DIAGNOSIS — Z23 Encounter for immunization: Secondary | ICD-10-CM | POA: Diagnosis not present

## 2020-12-09 MED ORDER — FILGRASTIM-SNDZ 300 MCG/0.5ML IJ SOSY
300.0000 ug | PREFILLED_SYRINGE | Freq: Once | INTRAMUSCULAR | Status: AC
Start: 2020-12-09 — End: 2020-12-09
  Administered 2020-12-09: 300 ug via SUBCUTANEOUS
  Filled 2020-12-09: qty 0.5

## 2020-12-10 ENCOUNTER — Inpatient Hospital Stay: Payer: 59

## 2020-12-10 DIAGNOSIS — Z170 Estrogen receptor positive status [ER+]: Secondary | ICD-10-CM

## 2020-12-10 DIAGNOSIS — Z79899 Other long term (current) drug therapy: Secondary | ICD-10-CM | POA: Diagnosis not present

## 2020-12-10 DIAGNOSIS — Z5111 Encounter for antineoplastic chemotherapy: Secondary | ICD-10-CM | POA: Diagnosis not present

## 2020-12-10 DIAGNOSIS — Z5112 Encounter for antineoplastic immunotherapy: Secondary | ICD-10-CM | POA: Diagnosis not present

## 2020-12-10 DIAGNOSIS — Z5189 Encounter for other specified aftercare: Secondary | ICD-10-CM | POA: Diagnosis not present

## 2020-12-10 DIAGNOSIS — Z23 Encounter for immunization: Secondary | ICD-10-CM | POA: Diagnosis not present

## 2020-12-10 DIAGNOSIS — C50411 Malignant neoplasm of upper-outer quadrant of right female breast: Secondary | ICD-10-CM | POA: Diagnosis not present

## 2020-12-10 MED ORDER — FILGRASTIM-SNDZ 300 MCG/0.5ML IJ SOSY
300.0000 ug | PREFILLED_SYRINGE | Freq: Once | INTRAMUSCULAR | Status: AC
  Administered 2020-12-10: 300 ug via SUBCUTANEOUS
  Filled 2020-12-10: qty 0.5

## 2020-12-12 ENCOUNTER — Encounter: Payer: Self-pay | Admitting: Licensed Clinical Social Worker

## 2020-12-12 ENCOUNTER — Ambulatory Visit: Payer: Self-pay | Admitting: Licensed Clinical Social Worker

## 2020-12-12 ENCOUNTER — Encounter: Payer: Self-pay | Admitting: Oncology

## 2020-12-12 ENCOUNTER — Telehealth: Payer: Self-pay | Admitting: Licensed Clinical Social Worker

## 2020-12-12 DIAGNOSIS — Z1379 Encounter for other screening for genetic and chromosomal anomalies: Secondary | ICD-10-CM | POA: Insufficient documentation

## 2020-12-12 DIAGNOSIS — Z170 Estrogen receptor positive status [ER+]: Secondary | ICD-10-CM

## 2020-12-12 DIAGNOSIS — Z808 Family history of malignant neoplasm of other organs or systems: Secondary | ICD-10-CM

## 2020-12-12 DIAGNOSIS — Z1589 Genetic susceptibility to other disease: Secondary | ICD-10-CM | POA: Insufficient documentation

## 2020-12-12 DIAGNOSIS — Z1501 Genetic susceptibility to malignant neoplasm of breast: Secondary | ICD-10-CM | POA: Insufficient documentation

## 2020-12-12 DIAGNOSIS — Z515 Encounter for palliative care: Secondary | ICD-10-CM | POA: Insufficient documentation

## 2020-12-12 DIAGNOSIS — C50411 Malignant neoplasm of upper-outer quadrant of right female breast: Secondary | ICD-10-CM

## 2020-12-12 NOTE — Telephone Encounter (Signed)
Disclosed positive genetic test result. 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 We discussed this gene and management guidelines in detail, and discussed that her daughters/other family members will need testing. We will provide her with a copy of the report as well as a family letter to give to her family.
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
## 2020-12-12 NOTE — Progress Notes (Addendum)
HPI:   Ms. Weide was previously seen in the [facility] [place] clinic due to a personal history of breast cancer and concerns regarding a hereditary predisposition to cancer. Please refer to our prior cancer genetics clinic note for more information regarding our discussion, assessment and recommendations, at the time. Ms. Carriveau recent genetic test results were disclosed to her, as were recommendations warranted by these results. These results and recommendations are discussed in more detail below.  CANCER HISTORY:  Oncology History  Malignant neoplasm of upper-outer quadrant of right breast in female, estrogen receptor positive ([place])  10/30/2020 Cancer Staging   Staging form: Breast, AJCC 8th Edition - Clinical stage from 10/30/2020: Stage IIB (cT1c, cN1, cM0, G3, ER+, PR-, HER2-) - Signed by Sindy Guadeloupe, MD on 11/03/2020 Histologic grading system: 3 grade system   11/03/2020 Initial Diagnosis   Malignant neoplasm of upper-outer quadrant of right breast in female, estrogen receptor positive ([place])   11/08/2020 -  Chemotherapy   Patient is on Treatment Plan : BREAST Pembrolizumab 218m q3w D1 + Carboplatin D1,22q3w + Paclitaxel D1,8,15,22,29,36  X 2 cycles / Pembrolizumab (400 mg) D1+ AC D1,22 q42d x 2 cycles       FAMILY HISTORY:  We obtained a detailed, 4-generation family history.  Significant diagnoses are listed below: Family History  Problem Relation Age of Onset   Heart attack Mother    Heart disease Mother        died from aortic dissection during CABG surgery   Hypertension Mother    Stroke Mother    Skin cancer Sister        basal cell on face   Heart disease Maternal Uncle    Heart disease Maternal Grandmother    Breast cancer Neg Hx     Ms. Read has 2 daughters (340and 351. She has 3 sisters and 2 brothers. A sister had basal cell carcinoma on her face.    Ms. HOstroskymother died at 746of heart issues. She had 2 brothers, both living in their 87s no  cancers. No cancers in maternal cousins. Maternal grandmother died at 975due to heart issues. There may be some cancers in distant relatives on this side of the family. Maternal grandfather died at 939   Ms. HVathdoes not have information about her paternal side of the family.   Ms. HWedelis unaware of previous family history of genetic testing for hereditary cancer risks. Patient's maternal ancestors are of unknown descent, and paternal ancestors are of unknown descent. There is no reported Ashkenazi Jewish ancestry. There is no known consanguinity.     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 The remainder of testing was negative.   The CancerNext-Expanded + RNAinsight gene panel offered by [employer] includes sequencing and rearrangement analysis for the following 77 genes: IP, ALK, APC*, ATM*, AXIN2, BAP1, BARD1, BLM, BMPR1A, BRCA1*, BRCA2*, BRIP1*, CDC73, CDH1*,CDK4, CDKN1B, CDKN2A, CHEK2*, CTNNA1, DICER1, FANCC, FH, FLCN, GALNT12, KIF1B, LZTR1, MAX, MEN1, MET, MLH1*, MSH2*, MSH3, MSH6*, MUTYH*, NBN, NF1*, NF2, NTHL1, PALB2*, PHOX2B, PMS2*, POT1, PRKAR1A, PTCH1, PTEN*, RAD51C*, RAD51D*,RB1, RECQL, RET, SDHA, SDHAF2, SDHB, SDHC, SDHD, SMAD4, SMARCA4, SMARCB1, SMARCE1, STK11, SUFU, TMEM127, TP53*,TSC1, TSC2, VHL and XRCC2 (sequencing and deletion/duplication); EGFR, EGLN1, HOXB13, KIT, MITF, PDGFRA, POLD1 and POLE (sequencing only); EPCAM and GREM1 (deletion/duplication only).   The test report has been scanned into EPIC and is located under the Molecular Pathology section of the Results Review  tab.  A portion of the result report is included below for reference. Genetic testing reported out on 12/11/2020.    BRIP1  Clinical condition Women who are carriers of a single pathogenic BRIP1 variant have an increased risk of ovarian and breast cancer. The exact risk figures for breast cancer have yet to be determined;  however, studies suggest the risk of ovarian cancer is approximately 8% and may be up to 15% (PMID: 59163846, 65993570, 17793903, 00923300). An individual with a BRIP1 pathogenic variant will not necessarily develop cancer in their lifetime, but the risk for cancer is increased over the general population risk.   Inheritance Hereditary predisposition to cancer due to a single pathogenic variant in the BRIP1 gene has autosomal dominant inheritance. This means that an individual with a pathogenic variant has a 50% chance of passing the condition on to their offspring. Once such a variant is detected in an individual, it is possible to identify at-risk relatives who can pursue testing for this specific familial variant. Many cases are inherited from a parent, but some cases can occur spontaneously (i.e., an individual with a pathogenic variant has parents who do not have it).   Additionally, individuals with a pathogenic variant in BRIP1 are carriers of Fanconi anemia type J. Fanconi anemia is an autosomal recessive disorder that is characterized by bone marrow failure and variable presentation of anomalies, including short stature, abnormal skin pigmentation, abnormal thumbs, malformations of the skeletal and central nervous systems, and developmental delay (PMID: 7622633, 35456256). Risks for leukemia and early onset solid tumors are significantly elevated (PMID: 38937342, 87681157, 26203559). For there to be a risk of Fanconi anemia in offspring, both parents would each have to have a single pathogenic variant in [place]; in such a case, the risk of having an affected child is 25%.  Management The [place] (NCCN) recommends consideration of prophylactic salpingo-oophorectomy (surgical removal of the ovaries and fallopian tubes) for women with a pathogenic variant in [place] after childbearing is complete (NCCN v.1.2023), ages 61-50 or earlier based on family history. The current NCCN  guidelines do not recommend additional breast cancer screening for individuals with a single pathogenic BRIP1 variant beyond what is recommended for the general population. However, they caution that cancer screening should ultimately be guided by personal and family history (NCCN v.1.2023).   An individual's cancer risk and medical management are not determined by genetic test results alone. Overall cancer risk assessment incorporates additional factors, including personal medical history, family history, and any available genetic information that may result in a personalized plan for cancer prevention and surveillance.  Even though data regarding BRIP1 is preliminary, knowing if a pathogenic variant is present is advantageous. At-risk relatives can be identified, enabling pursuit of a diagnostic evaluation. Further, the available information regarding hereditary cancer susceptibility genes is constantly evolving and more clinically relevant data regarding BRIP1 are likely to become available in the near future. Awareness of this cancer predisposition encourages patients and their providers to inform at-risk family members, to diligently follow standard screening protocols, and to be vigilant in maintaining close and regular contact with their local genetics clinic in anticipation of new information.  FAMILY MEMBERS: It is important that all of Ms. Brander's relatives (both men and women) know of the presence of this gene mutation. Site-specific genetic testing can sort out who in the family is at risk and who is not.    Ms. Madruga children and siblings have a 50% chance to have inherited this  mutation. We recommend they have genetic testing for this same mutation, as identifying the presence of this mutation would allow them to also take advantage of risk-reducing measures.   PLAN:   1. These results will be made available to  her care team. She would like her Dr. [name] to follow her long-term for  this indication and coordinate screening/prophylactic surgeries.    2. Ms. Fetterly plans to discuss these results with her family and will reach out to [place] if we can be of any assistance in coordinating genetic testing for any of her relatives.    SUPPORT AND RESOURCES: If Ms. Gertner is interested in information and support, there are two groups, Facing Our Risk (www.facingourrisk.com) and Bright Pink (www.brightpink.org) which some people have found useful. They provide opportunities to speak with other individuals from high-risk families. To locate genetic counselors in other cities, visit the website of the [employer] (ArtistMovie.se) and Secretary/administrator for a Social worker by zip code.  We encouraged Ms. Dunbar to remain in contact with [place] on an annual basis so we can update her personal and family histories, and let her know of advances in cancer genetics that may benefit the family. Our contact number was provided. Ms. Mills questions were answered to her satisfaction today, and she knows she is welcome to call anytime with additional questions.   Faith Rogue, MS, [facility] Genetic Counselor [email or web address] Phone: [phone]

## 2020-12-13 ENCOUNTER — Other Ambulatory Visit: Payer: Self-pay

## 2020-12-13 ENCOUNTER — Inpatient Hospital Stay: Payer: 59

## 2020-12-13 VITALS — BP 131/82 | HR 85 | Temp 97.0°F | Resp 17 | Wt 131.4 lb

## 2020-12-13 DIAGNOSIS — Z5189 Encounter for other specified aftercare: Secondary | ICD-10-CM | POA: Diagnosis not present

## 2020-12-13 DIAGNOSIS — C50411 Malignant neoplasm of upper-outer quadrant of right female breast: Secondary | ICD-10-CM

## 2020-12-13 DIAGNOSIS — Z5112 Encounter for antineoplastic immunotherapy: Secondary | ICD-10-CM | POA: Diagnosis not present

## 2020-12-13 DIAGNOSIS — Z23 Encounter for immunization: Secondary | ICD-10-CM | POA: Diagnosis not present

## 2020-12-13 DIAGNOSIS — Z170 Estrogen receptor positive status [ER+]: Secondary | ICD-10-CM

## 2020-12-13 DIAGNOSIS — Z5111 Encounter for antineoplastic chemotherapy: Secondary | ICD-10-CM | POA: Diagnosis not present

## 2020-12-13 DIAGNOSIS — Z79899 Other long term (current) drug therapy: Secondary | ICD-10-CM | POA: Diagnosis not present

## 2020-12-13 LAB — CBC WITH DIFFERENTIAL/PLATELET
Abs Immature Granulocytes: 0.6 10*3/uL — ABNORMAL HIGH (ref 0.00–0.07)
Basophils Absolute: 0 10*3/uL (ref 0.0–0.1)
Basophils Relative: 0 %
Eosinophils Absolute: 0.2 10*3/uL (ref 0.0–0.5)
Eosinophils Relative: 2 %
HCT: 30.7 % — ABNORMAL LOW (ref 36.0–46.0)
Hemoglobin: 10.6 g/dL — ABNORMAL LOW (ref 12.0–15.0)
Lymphocytes Relative: 24 %
Lymphs Abs: 1.9 10*3/uL (ref 0.7–4.0)
MCH: 32.1 pg (ref 26.0–34.0)
MCHC: 34.5 g/dL (ref 30.0–36.0)
MCV: 93 fL (ref 80.0–100.0)
Metamyelocytes Relative: 2 %
Monocytes Absolute: 0.9 10*3/uL (ref 0.1–1.0)
Monocytes Relative: 11 %
Myelocytes: 6 %
Neutro Abs: 4.5 10*3/uL (ref 1.7–7.7)
Neutrophils Relative %: 55 %
Platelets: 200 10*3/uL (ref 150–400)
RBC: 3.3 MIL/uL — ABNORMAL LOW (ref 3.87–5.11)
RDW: 15 % (ref 11.5–15.5)
Smear Review: NORMAL
WBC: 8.1 10*3/uL (ref 4.0–10.5)
nRBC: 0.5 % — ABNORMAL HIGH (ref 0.0–0.2)

## 2020-12-13 LAB — COMPREHENSIVE METABOLIC PANEL
ALT: 159 U/L — ABNORMAL HIGH (ref 0–44)
AST: 57 U/L — ABNORMAL HIGH (ref 15–41)
Albumin: 4.1 g/dL (ref 3.5–5.0)
Alkaline Phosphatase: 90 U/L (ref 38–126)
Anion gap: 8 (ref 5–15)
BUN: 14 mg/dL (ref 8–23)
CO2: 28 mmol/L (ref 22–32)
Calcium: 9.3 mg/dL (ref 8.9–10.3)
Chloride: 100 mmol/L (ref 98–111)
Creatinine, Ser: 0.87 mg/dL (ref 0.44–1.00)
GFR, Estimated: >60 mL/min (ref >60)
Glucose, Bld: 91 mg/dL (ref 70–99)
Potassium: 3.8 mmol/L (ref 3.5–5.1)
Sodium: 136 mmol/L (ref 135–145)
Total Bilirubin: 0.5 mg/dL (ref 0.3–1.2)
Total Protein: 6.9 g/dL (ref 6.5–8.1)

## 2020-12-13 LAB — TSH: TSH: 4.02 u[IU]/mL (ref 0.350–4.500)

## 2020-12-13 MED ORDER — HEPARIN SOD (PORK) LOCK FLUSH 100 UNIT/ML IV SOLN
500.0000 [IU] | Freq: Once | INTRAVENOUS | Status: DC | PRN
  Filled 2020-12-13: qty 5

## 2020-12-13 MED ORDER — SODIUM CHLORIDE 0.9 % IV SOLN
Freq: Once | INTRAVENOUS | Status: AC
  Filled 2020-12-13: qty 250

## 2020-12-13 MED ORDER — HEPARIN SOD (PORK) LOCK FLUSH 100 UNIT/ML IV SOLN
500.0000 [IU] | Freq: Once | INTRAVENOUS | Status: AC
  Filled 2020-12-13: qty 5

## 2020-12-13 MED ORDER — HEPARIN SOD (PORK) LOCK FLUSH 100 UNIT/ML IV SOLN
INTRAVENOUS | Status: AC
  Administered 2020-12-13: 500 [IU] via INTRAVENOUS
  Filled 2020-12-13: qty 5

## 2020-12-13 MED ORDER — INFLUENZA VAC SPLIT QUAD 0.5 ML IM SUSY
0.5000 mL | PREFILLED_SYRINGE | Freq: Once | INTRAMUSCULAR | Status: AC
  Administered 2020-12-13: 0.5 mL via INTRAMUSCULAR
  Filled 2020-12-13: qty 0.5

## 2020-12-13 MED ORDER — SODIUM CHLORIDE 0.9 % IV SOLN
10.0000 mg | Freq: Once | INTRAVENOUS | Status: AC
  Administered 2020-12-13: 10 mg via INTRAVENOUS
  Filled 2020-12-13: qty 10

## 2020-12-13 MED ORDER — DIPHENHYDRAMINE HCL 50 MG/ML IJ SOLN
12.5000 mg | Freq: Once | INTRAMUSCULAR | Status: AC
  Administered 2020-12-13: 12.5 mg via INTRAVENOUS
  Filled 2020-12-13: qty 1

## 2020-12-13 MED ORDER — FAMOTIDINE 20 MG IN NS 100 ML IVPB
20.0000 mg | Freq: Once | INTRAVENOUS | Status: AC
  Administered 2020-12-13: 20 mg via INTRAVENOUS
  Filled 2020-12-13: qty 20

## 2020-12-13 MED ORDER — SODIUM CHLORIDE 0.9% FLUSH
10.0000 mL | INTRAVENOUS | Status: DC | PRN
  Administered 2020-12-13: 10 mL via INTRAVENOUS
  Filled 2020-12-13: qty 10

## 2020-12-13 MED ORDER — SODIUM CHLORIDE 0.9 % IV SOLN
65.0000 mg/m2 | Freq: Once | INTRAVENOUS | Status: AC
  Administered 2020-12-13: 102 mg via INTRAVENOUS
  Filled 2020-12-13: qty 17

## 2020-12-13 NOTE — Patient Instructions (Signed)
[facility] ONCOLOGY  Discharge Instructions: Thank you for choosing [facility] to provide your oncology and hematology care.  If you have a lab appointment with the [facility], please go directly to the [facility] and check in at the registration area.  Wear comfortable clothing and clothing appropriate for easy access to any Portacath or PICC line.   We strive to give you quality time with your provider. You may need to reschedule your appointment if you arrive late (15 or more minutes).  Arriving late affects you and other patients whose appointments are after yours.  Also, if you miss three or more appointments without notifying the office, you may be dismissed from the clinic at the provider's discretion.      For prescription refill requests, have your pharmacy contact our office and allow 72 hours for refills to be completed.    Today you received the following chemotherapy and/or immunotherapy agents Taxol        To help prevent nausea and vomiting after your treatment, we encourage you to take your nausea medication as directed.  BELOW ARE SYMPTOMS THAT SHOULD BE REPORTED IMMEDIATELY: *FEVER GREATER THAN 100.4 F (38 C) OR HIGHER *CHILLS OR SWEATING *NAUSEA AND VOMITING THAT IS NOT CONTROLLED WITH YOUR NAUSEA MEDICATION *UNUSUAL SHORTNESS OF BREATH *UNUSUAL BRUISING OR BLEEDING *URINARY PROBLEMS (pain or burning when urinating, or frequent urination) *BOWEL PROBLEMS (unusual diarrhea, constipation, pain near the anus) TENDERNESS IN MOUTH AND THROAT WITH OR WITHOUT PRESENCE OF ULCERS (sore throat, sores in mouth, or a toothache) UNUSUAL RASH, SWELLING OR PAIN  UNUSUAL VAGINAL DISCHARGE OR ITCHING   Items with * indicate a potential emergency and should be followed up as soon as possible or go to the Emergency Department if any problems should occur.  Please show the CHEMOTHERAPY ALERT CARD or IMMUNOTHERAPY ALERT CARD at check-in to  the Emergency Department and triage nurse.  Should you have questions after your visit or need to cancel or reschedule your appointment, please contact [facility]  [phone] and follow the prompts.  Office hours are 8:00 a.m. to 4:30 p.m. Monday - Friday. Please note that voicemails left after 4:00 p.m. may not be returned until the following business day.  We are closed weekends and major holidays. You have access to a nurse at all times for urgent questions. Please call the main number to the clinic [phone] and follow the prompts.  For any non-urgent questions, you may also contact your provider using MyChart. We now offer e-Visits for anyone 17 and older to request care online for non-urgent symptoms. For details visit [email or web address].   Also download the MyChart app! Go to the app store, search "MyChart", open the app, select [facility], and log in with your MyChart username and password.  Due to Covid, a mask is required upon entering the hospital/clinic. If you do not have a mask, one will be given to you upon arrival. For doctor visits, patients may have 1 support person aged 71 or older with them. For treatment visits, patients cannot have anyone with them due to current Covid guidelines and our immunocompromised population.

## 2020-12-13 NOTE — Progress Notes (Signed)
AST 57, ALT 159. Per Dr. [name] okay to proceed with Taxol treatment as scheduled.

## 2020-12-17 ENCOUNTER — Other Ambulatory Visit: Payer: Self-pay

## 2020-12-18 ENCOUNTER — Other Ambulatory Visit: Payer: Self-pay

## 2020-12-20 ENCOUNTER — Other Ambulatory Visit: Payer: Self-pay

## 2020-12-20 ENCOUNTER — Inpatient Hospital Stay: Payer: 59

## 2020-12-20 ENCOUNTER — Encounter: Payer: Self-pay | Admitting: Oncology

## 2020-12-20 ENCOUNTER — Inpatient Hospital Stay (HOSPITAL_BASED_OUTPATIENT_CLINIC_OR_DEPARTMENT_OTHER): Payer: 59 | Admitting: Oncology

## 2020-12-20 VITALS — BP 114/90 | HR 93 | Temp 98.5°F | Resp 18 | Wt 133.0 lb

## 2020-12-20 DIAGNOSIS — Z5111 Encounter for antineoplastic chemotherapy: Secondary | ICD-10-CM

## 2020-12-20 DIAGNOSIS — Z170 Estrogen receptor positive status [ER+]: Secondary | ICD-10-CM

## 2020-12-20 DIAGNOSIS — Z5189 Encounter for other specified aftercare: Secondary | ICD-10-CM | POA: Diagnosis not present

## 2020-12-20 DIAGNOSIS — Z5112 Encounter for antineoplastic immunotherapy: Secondary | ICD-10-CM

## 2020-12-20 DIAGNOSIS — Z23 Encounter for immunization: Secondary | ICD-10-CM | POA: Diagnosis not present

## 2020-12-20 DIAGNOSIS — Z79899 Other long term (current) drug therapy: Secondary | ICD-10-CM | POA: Diagnosis not present

## 2020-12-20 DIAGNOSIS — C50411 Malignant neoplasm of upper-outer quadrant of right female breast: Secondary | ICD-10-CM | POA: Diagnosis not present

## 2020-12-20 LAB — CBC WITH DIFFERENTIAL/PLATELET
Abs Immature Granulocytes: 0.04 10*3/uL (ref 0.00–0.07)
Basophils Absolute: 0 10*3/uL (ref 0.0–0.1)
Basophils Relative: 1 %
Eosinophils Absolute: 0 10*3/uL (ref 0.0–0.5)
Eosinophils Relative: 1 %
HCT: 30 % — ABNORMAL LOW (ref 36.0–46.0)
Hemoglobin: 10.4 g/dL — ABNORMAL LOW (ref 12.0–15.0)
Immature Granulocytes: 1 %
Lymphocytes Relative: 22 %
Lymphs Abs: 1.4 10*3/uL (ref 0.7–4.0)
MCH: 32.5 pg (ref 26.0–34.0)
MCHC: 34.7 g/dL (ref 30.0–36.0)
MCV: 93.8 fL (ref 80.0–100.0)
Monocytes Absolute: 0.3 10*3/uL (ref 0.1–1.0)
Monocytes Relative: 5 %
Neutro Abs: 4.5 10*3/uL (ref 1.7–7.7)
Neutrophils Relative %: 70 %
Platelets: 259 10*3/uL (ref 150–400)
RBC: 3.2 MIL/uL — ABNORMAL LOW (ref 3.87–5.11)
RDW: 15.8 % — ABNORMAL HIGH (ref 11.5–15.5)
WBC: 6.4 10*3/uL (ref 4.0–10.5)
nRBC: 0 % (ref 0.0–0.2)

## 2020-12-20 LAB — COMPREHENSIVE METABOLIC PANEL
ALT: 184 U/L — ABNORMAL HIGH (ref 0–44)
AST: 89 U/L — ABNORMAL HIGH (ref 15–41)
Albumin: 4.1 g/dL (ref 3.5–5.0)
Alkaline Phosphatase: 82 U/L (ref 38–126)
Anion gap: 6 (ref 5–15)
BUN: 15 mg/dL (ref 8–23)
CO2: 26 mmol/L (ref 22–32)
Calcium: 8.7 mg/dL — ABNORMAL LOW (ref 8.9–10.3)
Chloride: 103 mmol/L (ref 98–111)
Creatinine, Ser: 0.67 mg/dL (ref 0.44–1.00)
GFR, Estimated: >60 mL/min (ref >60)
Glucose, Bld: 112 mg/dL — ABNORMAL HIGH (ref 70–99)
Potassium: 3.6 mmol/L (ref 3.5–5.1)
Sodium: 135 mmol/L (ref 135–145)
Total Bilirubin: 0.5 mg/dL (ref 0.3–1.2)
Total Protein: 6.9 g/dL (ref 6.5–8.1)

## 2020-12-20 MED ORDER — FAMOTIDINE 20 MG IN NS 100 ML IVPB
20.0000 mg | Freq: Once | INTRAVENOUS | Status: AC
  Administered 2020-12-20: 20 mg via INTRAVENOUS
  Filled 2020-12-20: qty 20

## 2020-12-20 MED ORDER — SODIUM CHLORIDE 0.9 % IV SOLN
Freq: Once | INTRAVENOUS | Status: AC
  Filled 2020-12-20: qty 250

## 2020-12-20 MED ORDER — PALONOSETRON HCL INJECTION 0.25 MG/5ML
0.2500 mg | Freq: Once | INTRAVENOUS | Status: AC
  Administered 2020-12-20: 0.25 mg via INTRAVENOUS
  Filled 2020-12-20: qty 5

## 2020-12-20 MED ORDER — HEPARIN SOD (PORK) LOCK FLUSH 100 UNIT/ML IV SOLN
INTRAVENOUS | Status: AC
  Administered 2020-12-20: 500 [IU]
  Filled 2020-12-20: qty 5

## 2020-12-20 MED ORDER — SODIUM CHLORIDE 0.9 % IV SOLN
150.0000 mg | Freq: Once | INTRAVENOUS | Status: AC
  Administered 2020-12-20: 150 mg via INTRAVENOUS
  Filled 2020-12-20: qty 150

## 2020-12-20 MED ORDER — SODIUM CHLORIDE 0.9 % IV SOLN
473.5000 mg | Freq: Once | INTRAVENOUS | Status: AC
  Administered 2020-12-20: 470 mg via INTRAVENOUS
  Filled 2020-12-20: qty 47

## 2020-12-20 MED ORDER — PANTOPRAZOLE SODIUM 20 MG PO TBEC
20.0000 mg | DELAYED_RELEASE_TABLET | Freq: Every day | ORAL | 2 refills | Status: DC
  Filled 2020-12-20: qty 90, 90d supply, fill #0

## 2020-12-20 MED ORDER — SODIUM CHLORIDE 0.9% FLUSH
10.0000 mL | Freq: Once | INTRAVENOUS | Status: AC
  Administered 2020-12-20: 10 mL via INTRAVENOUS
  Filled 2020-12-20: qty 10

## 2020-12-20 MED ORDER — SODIUM CHLORIDE 0.9 % IV SOLN
10.0000 mg | Freq: Once | INTRAVENOUS | Status: AC
  Administered 2020-12-20: 10 mg via INTRAVENOUS
  Filled 2020-12-20: qty 10

## 2020-12-20 MED ORDER — DIPHENHYDRAMINE HCL 50 MG/ML IJ SOLN
12.5000 mg | Freq: Once | INTRAMUSCULAR | Status: AC
  Administered 2020-12-20: 12.5 mg via INTRAVENOUS
  Filled 2020-12-20: qty 1

## 2020-12-20 MED ORDER — SODIUM CHLORIDE 0.9 % IV SOLN
65.0000 mg/m2 | Freq: Once | INTRAVENOUS | Status: AC
  Administered 2020-12-20: 102 mg via INTRAVENOUS
  Filled 2020-12-20: qty 17

## 2020-12-20 MED ORDER — PEMBROLIZUMAB CHEMO INJECTION 100 MG/4ML
200.0000 mg | Freq: Once | INTRAVENOUS | Status: AC
Start: 2020-12-20 — End: 2020-12-20
  Administered 2020-12-20: 200 mg via INTRAVENOUS
  Filled 2020-12-20: qty 8

## 2020-12-20 NOTE — Progress Notes (Signed)
Per [name] -ok to tx with AST 89 & ALT 184.

## 2020-12-20 NOTE — Patient Instructions (Addendum)
[facility]  Discharge Instructions: Thank you for choosing [facility] to provide your oncology and hematology care.  If you have a lab appointment with the [facility], please go directly to the [facility] and check in at the registration area.  Wear comfortable clothing and clothing appropriate for easy access to any Portacath or PICC line.   We strive to give you quality time with your provider. You may need to reschedule your appointment if you arrive late (15 or more minutes).  Arriving late affects you and other patients whose appointments are after yours.  Also, if you miss three or more appointments without notifying the office, you may be dismissed from the clinic at the provider's discretion.      For prescription refill requests, have your pharmacy contact our office and allow 72 hours for refills to be completed.    Today you received the following chemotherapy and/or immunotherapy agents :    To help prevent nausea and vomiting after your treatment, we encourage you to take your nausea medication as directed.  BELOW ARE SYMPTOMS THAT SHOULD BE REPORTED IMMEDIATELY: *FEVER GREATER THAN 100.4 F (38 C) OR HIGHER *CHILLS OR SWEATING *NAUSEA AND VOMITING THAT IS NOT CONTROLLED WITH YOUR NAUSEA MEDICATION *UNUSUAL SHORTNESS OF BREATH *UNUSUAL BRUISING OR BLEEDING *URINARY PROBLEMS (pain or burning when urinating, or frequent urination) *BOWEL PROBLEMS (unusual diarrhea, constipation, pain near the anus) TENDERNESS IN MOUTH AND THROAT WITH OR WITHOUT PRESENCE OF ULCERS (sore throat, sores in mouth, or a toothache) UNUSUAL RASH, SWELLING OR PAIN  UNUSUAL VAGINAL DISCHARGE OR ITCHING   Items with * indicate a potential emergency and should be followed up as soon as possible or go to the Emergency Department if any problems should occur.  Please show the CHEMOTHERAPY ALERT CARD or IMMUNOTHERAPY ALERT CARD at check-in to the  Emergency Department and triage nurse.  Should you have questions after your visit or need to cancel or reschedule your appointment, please contact [facility]  [phone] and follow the prompts.  Office hours are 8:00 a.m. to 4:30 p.m. Monday - Friday. Please note that voicemails left after 4:00 p.m. may not be returned until the following business day.  We are closed weekends and major holidays. You have access to a nurse at all times for urgent questions. Please call the main number to the clinic [phone] and follow the prompts.  For any non-urgent questions, you may also contact your provider using MyChart. We now offer e-Visits for anyone 55 and older to request care online for non-urgent symptoms. For details visit [email or web address].   Also download the MyChart app! Go to the app store, search "MyChart", open the app, select [facility], and log in with your MyChart username and password.  Due to Covid, a mask is required upon entering the hospital/clinic. If you do not have a mask, one will be given to you upon arrival. For doctor visits, patients may have 1 support person aged 68 or older with them. For treatment visits, patients cannot have anyone with them due to current Covid guidelines and our immunocompromised population.
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
## 2020-12-20 NOTE — Progress Notes (Signed)
Patient here for follow up with labs and treatment today. She states that when she woke up today, she noticed a pain in her left, lower breast, and it is sore to the touch. She isn't sure if she feels a lump there or not. She also states that she has been having acid reflux, particularly at night, and it sometimes wakes her from her sleep. She is not currently taking a PPI or acid prevention medication.

## 2020-12-23 ENCOUNTER — Other Ambulatory Visit: Payer: Self-pay

## 2020-12-24 ENCOUNTER — Other Ambulatory Visit: Payer: Self-pay

## 2020-12-24 ENCOUNTER — Encounter: Payer: Self-pay | Admitting: Family Medicine

## 2020-12-24 MED ORDER — FLUTICASONE PROPIONATE 50 MCG/ACT NA SUSP
2.0000 | Freq: Every day | NASAL | 6 refills | Status: DC
  Filled 2020-12-24: qty 16, 30d supply, fill #0
  Filled 2021-01-21: qty 16, 30d supply, fill #1
  Filled 2021-03-08: qty 16, 30d supply, fill #2
  Filled 2021-04-15: qty 16, 30d supply, fill #3
  Filled 2021-06-19: qty 16, 30d supply, fill #4
  Filled 2021-08-26: qty 16, 30d supply, fill #5
  Filled 2021-10-12: qty 16, 30d supply, fill #6

## 2020-12-27 ENCOUNTER — Inpatient Hospital Stay: Payer: 59

## 2020-12-27 ENCOUNTER — Encounter: Payer: Self-pay | Admitting: Oncology

## 2020-12-27 ENCOUNTER — Other Ambulatory Visit: Payer: Self-pay

## 2020-12-27 ENCOUNTER — Inpatient Hospital Stay: Payer: 59 | Attending: Oncology

## 2020-12-27 VITALS — BP 109/62 | HR 88 | Temp 98.0°F | Resp 18 | Wt 133.0 lb

## 2020-12-27 DIAGNOSIS — C50911 Malignant neoplasm of unspecified site of right female breast: Secondary | ICD-10-CM | POA: Diagnosis not present

## 2020-12-27 DIAGNOSIS — C773 Secondary and unspecified malignant neoplasm of axilla and upper limb lymph nodes: Secondary | ICD-10-CM | POA: Insufficient documentation

## 2020-12-27 DIAGNOSIS — Z79899 Other long term (current) drug therapy: Secondary | ICD-10-CM | POA: Insufficient documentation

## 2020-12-27 DIAGNOSIS — Z5189 Encounter for other specified aftercare: Secondary | ICD-10-CM | POA: Diagnosis not present

## 2020-12-27 DIAGNOSIS — Z5111 Encounter for antineoplastic chemotherapy: Secondary | ICD-10-CM | POA: Insufficient documentation

## 2020-12-27 DIAGNOSIS — Z170 Estrogen receptor positive status [ER+]: Secondary | ICD-10-CM

## 2020-12-27 DIAGNOSIS — C50411 Malignant neoplasm of upper-outer quadrant of right female breast: Secondary | ICD-10-CM

## 2020-12-27 DIAGNOSIS — Z95828 Presence of other vascular implants and grafts: Secondary | ICD-10-CM

## 2020-12-27 LAB — CBC WITH DIFFERENTIAL/PLATELET
Abs Immature Granulocytes: 0.01 10*3/uL (ref 0.00–0.07)
Basophils Absolute: 0 10*3/uL (ref 0.0–0.1)
Basophils Relative: 1 %
Eosinophils Absolute: 0.1 10*3/uL (ref 0.0–0.5)
Eosinophils Relative: 2 %
HCT: 28.3 % — ABNORMAL LOW (ref 36.0–46.0)
Hemoglobin: 9.8 g/dL — ABNORMAL LOW (ref 12.0–15.0)
Immature Granulocytes: 0 %
Lymphocytes Relative: 28 %
Lymphs Abs: 1.1 10*3/uL (ref 0.7–4.0)
MCH: 32.8 pg (ref 26.0–34.0)
MCHC: 34.6 g/dL (ref 30.0–36.0)
MCV: 94.6 fL (ref 80.0–100.0)
Monocytes Absolute: 0.3 10*3/uL (ref 0.1–1.0)
Monocytes Relative: 7 %
Neutro Abs: 2.4 10*3/uL (ref 1.7–7.7)
Neutrophils Relative %: 62 %
Platelets: 263 10*3/uL (ref 150–400)
RBC: 2.99 MIL/uL — ABNORMAL LOW (ref 3.87–5.11)
RDW: 16.1 % — ABNORMAL HIGH (ref 11.5–15.5)
WBC: 3.8 10*3/uL — ABNORMAL LOW (ref 4.0–10.5)
nRBC: 0 % (ref 0.0–0.2)

## 2020-12-27 LAB — COMPREHENSIVE METABOLIC PANEL
ALT: 54 U/L — ABNORMAL HIGH (ref 0–44)
AST: 25 U/L (ref 15–41)
Albumin: 4 g/dL (ref 3.5–5.0)
Alkaline Phosphatase: 72 U/L (ref 38–126)
Anion gap: 9 (ref 5–15)
BUN: 13 mg/dL (ref 8–23)
CO2: 25 mmol/L (ref 22–32)
Calcium: 8.8 mg/dL — ABNORMAL LOW (ref 8.9–10.3)
Chloride: 102 mmol/L (ref 98–111)
Creatinine, Ser: 0.67 mg/dL (ref 0.44–1.00)
GFR, Estimated: >60 mL/min (ref >60)
Glucose, Bld: 112 mg/dL — ABNORMAL HIGH (ref 70–99)
Potassium: 3.6 mmol/L (ref 3.5–5.1)
Sodium: 136 mmol/L (ref 135–145)
Total Bilirubin: 0.6 mg/dL (ref 0.3–1.2)
Total Protein: 6.7 g/dL (ref 6.5–8.1)

## 2020-12-27 MED ORDER — DIPHENHYDRAMINE HCL 50 MG/ML IJ SOLN
12.5000 mg | Freq: Once | INTRAMUSCULAR | Status: AC
  Administered 2020-12-27: 12.5 mg via INTRAVENOUS
  Filled 2020-12-27: qty 1

## 2020-12-27 MED ORDER — SODIUM CHLORIDE 0.9 % IV SOLN
Freq: Once | INTRAVENOUS | Status: AC
  Filled 2020-12-27: qty 250

## 2020-12-27 MED ORDER — SODIUM CHLORIDE 0.9 % IV SOLN
10.0000 mg | Freq: Once | INTRAVENOUS | Status: AC
  Administered 2020-12-27: 10 mg via INTRAVENOUS
  Filled 2020-12-27: qty 10

## 2020-12-27 MED ORDER — HEPARIN SOD (PORK) LOCK FLUSH 100 UNIT/ML IV SOLN
500.0000 [IU] | Freq: Once | INTRAVENOUS | Status: DC
  Administered 2020-12-27: 500 [IU] via INTRAVENOUS
  Filled 2020-12-27: qty 5

## 2020-12-27 MED ORDER — SODIUM CHLORIDE 0.9% FLUSH
10.0000 mL | Freq: Once | INTRAVENOUS | Status: DC
  Administered 2020-12-27: 10 mL via INTRAVENOUS
  Filled 2020-12-27: qty 10

## 2020-12-27 MED ORDER — FAMOTIDINE 20 MG IN NS 100 ML IVPB
20.0000 mg | Freq: Once | INTRAVENOUS | Status: AC
  Administered 2020-12-27: 20 mg via INTRAVENOUS
  Filled 2020-12-27: qty 20

## 2020-12-27 MED ORDER — SODIUM CHLORIDE 0.9 % IV SOLN
65.0000 mg/m2 | Freq: Once | INTRAVENOUS | Status: AC
  Administered 2020-12-27: 102 mg via INTRAVENOUS
  Filled 2020-12-27: qty 17

## 2020-12-27 NOTE — Telephone Encounter (Signed)
See previous note on 9/27

## 2020-12-27 NOTE — Telephone Encounter (Signed)
See previous note on 9/26

## 2020-12-27 NOTE — Patient Instructions (Signed)
[facility]  Discharge Instructions: Thank you for choosing [facility] to provide your oncology and hematology care.  If you have a lab appointment with the [facility], please go directly to the [facility] and check in at the registration area.  Wear comfortable clothing and clothing appropriate for easy access to any Portacath or PICC line.   We strive to give you quality time with your provider. You may need to reschedule your appointment if you arrive late (15 or more minutes).  Arriving late affects you and other patients whose appointments are after yours.  Also, if you miss three or more appointments without notifying the office, you may be dismissed from the clinic at the provider's discretion.      For prescription refill requests, have your pharmacy contact our office and allow 72 hours for refills to be completed.    Today you received the following chemotherapy and/or immunotherapy agents TAXOL      To help prevent nausea and vomiting after your treatment, we encourage you to take your nausea medication as directed.  BELOW ARE SYMPTOMS THAT SHOULD BE REPORTED IMMEDIATELY: *FEVER GREATER THAN 100.4 F (38 C) OR HIGHER *CHILLS OR SWEATING *NAUSEA AND VOMITING THAT IS NOT CONTROLLED WITH YOUR NAUSEA MEDICATION *UNUSUAL SHORTNESS OF BREATH *UNUSUAL BRUISING OR BLEEDING *URINARY PROBLEMS (pain or burning when urinating, or frequent urination) *BOWEL PROBLEMS (unusual diarrhea, constipation, pain near the anus) TENDERNESS IN MOUTH AND THROAT WITH OR WITHOUT PRESENCE OF ULCERS (sore throat, sores in mouth, or a toothache) UNUSUAL RASH, SWELLING OR PAIN  UNUSUAL VAGINAL DISCHARGE OR ITCHING   Items with * indicate a potential emergency and should be followed up as soon as possible or go to the Emergency Department if any problems should occur.  Please show the CHEMOTHERAPY ALERT CARD or IMMUNOTHERAPY ALERT CARD at check-in to  the Emergency Department and triage nurse.  Should you have questions after your visit or need to cancel or reschedule your appointment, please contact [facility]  [phone] and follow the prompts.  Office hours are 8:00 a.m. to 4:30 p.m. Monday - Friday. Please note that voicemails left after 4:00 p.m. may not be returned until the following business day.  We are closed weekends and major holidays. You have access to a nurse at all times for urgent questions. Please call the main number to the clinic [phone] and follow the prompts.  For any non-urgent questions, you may also contact your provider using MyChart. We now offer e-Visits for anyone 70 and older to request care online for non-urgent symptoms. For details visit [email or web address].   Also download the MyChart app! Go to the app store, search "MyChart", open the app, select [facility], and log in with your MyChart username and password.  Due to Covid, a mask is required upon entering the hospital/clinic. If you do not have a mask, one will be given to you upon arrival. For doctor visits, patients may have 1 support person aged 28 or older with them. For treatment visits, patients cannot have anyone with them due to current Covid guidelines and our immunocompromised population.   Paclitaxel injection What is this medication? PACLITAXEL (PAK li TAX el) is a chemotherapy drug. It targets fast dividing cells, like cancer cells, and causes these cells to die. This medicine is used to treat ovarian cancer, breast cancer, lung cancer, Kaposi's sarcoma, and other cancers. This medicine may be used for other purposes; ask  your health care provider or pharmacist if you have questions. COMMON BRAND NAME(S): Onxol, Taxol What should I tell my care team before I take this medication? They need to know if you have any of these conditions: history of irregular heartbeat liver disease low blood counts,  like low white cell, platelet, or red cell counts lung or breathing disease, like asthma tingling of the fingers or toes, or other nerve disorder an unusual or allergic reaction to paclitaxel, alcohol, polyoxyethylated castor oil, other chemotherapy, other medicines, foods, dyes, or preservatives pregnant or trying to get pregnant breast-feeding How should I use this medication? This drug is given as an infusion into a vein. It is administered in a hospital or clinic by a specially trained health care professional. Talk to your pediatrician regarding the use of this medicine in children. Special care may be needed. Overdosage: If you think you have taken too much of this medicine contact a poison control center or emergency room at once. NOTE: This medicine is only for you. Do not share this medicine with others. What if I miss a dose? It is important not to miss your dose. Call your doctor or health care professional if you are unable to keep an appointment. What may interact with this medication? Do not take this medicine with any of the following medications: live virus vaccines This medicine may also interact with the following medications: antiviral medicines for hepatitis, HIV or AIDS certain antibiotics like erythromycin and clarithromycin certain medicines for fungal infections like ketoconazole and itraconazole certain medicines for seizures like carbamazepine, phenobarbital, phenytoin gemfibrozil nefazodone rifampin St. John's wort This list may not describe all possible interactions. Give your health care provider a list of all the medicines, herbs, non-prescription drugs, or dietary supplements you use. Also tell them if you smoke, drink alcohol, or use illegal drugs. Some items may interact with your medicine. What should I watch for while using this medication? Your condition will be monitored carefully while you are receiving this medicine. You will need important blood work  done while you are taking this medicine. This medicine can cause serious allergic reactions. To reduce your risk you will need to take other medicine(s) before treatment with this medicine. If you experience allergic reactions like skin rash, itching or hives, swelling of the face, lips, or tongue, tell your doctor or health care professional right away. In some cases, you may be given additional medicines to help with side effects. Follow all directions for their use. This drug may make you feel generally unwell. This is not uncommon, as chemotherapy can affect healthy cells as well as cancer cells. Report any side effects. Continue your course of treatment even though you feel ill unless your doctor tells you to stop. Call your doctor or health care professional for advice if you get a fever, chills or sore throat, or other symptoms of a cold or flu. Do not treat yourself. This drug decreases your body's ability to fight infections. Try to avoid being around people who are sick. This medicine may increase your risk to bruise or bleed. Call your doctor or health care professional if you notice any unusual bleeding. Be careful brushing and flossing your teeth or using a toothpick because you may get an infection or bleed more easily. If you have any dental work done, tell your dentist you are receiving this medicine. Avoid taking products that contain aspirin, acetaminophen, ibuprofen, naproxen, or ketoprofen unless instructed by your doctor. These medicines may hide a  fever. Do not become pregnant while taking this medicine. Women should inform their doctor if they wish to become pregnant or think they might be pregnant. There is a potential for serious side effects to an unborn child. Talk to your health care professional or pharmacist for more information. Do not breast-feed an infant while taking this medicine. Men are advised not to father a child while receiving this medicine. This product may  contain alcohol. Ask your pharmacist or healthcare provider if this medicine contains alcohol. Be sure to tell all healthcare providers you are taking this medicine. Certain medicines, like metronidazole and disulfiram, can cause an unpleasant reaction when taken with alcohol. The reaction includes flushing, headache, nausea, vomiting, sweating, and increased thirst. The reaction can last from 30 minutes to several hours. What side effects may I notice from receiving this medication? Side effects that you should report to your doctor or health care professional as soon as possible: allergic reactions like skin rash, itching or hives, swelling of the face, lips, or tongue breathing problems changes in vision fast, irregular heartbeat high or low blood pressure mouth sores pain, tingling, numbness in the hands or feet signs of decreased platelets or bleeding - bruising, pinpoint red spots on the skin, black, tarry stools, blood in the urine signs of decreased red blood cells - unusually weak or tired, feeling faint or lightheaded, falls signs of infection - fever or chills, cough, sore throat, pain or difficulty passing urine signs and symptoms of liver injury like dark yellow or brown urine; general ill feeling or flu-like symptoms; light-colored stools; loss of appetite; nausea; right upper belly pain; unusually weak or tired; yellowing of the eyes or skin swelling of the ankles, feet, hands unusually slow heartbeat Side effects that usually do not require medical attention (report to your doctor or health care professional if they continue or are bothersome): diarrhea hair loss loss of appetite muscle or joint pain nausea, vomiting pain, redness, or irritation at site where injected tiredness This list may not describe all possible side effects. Call your doctor for medical advice about side effects. You may report side effects to FDA at 1-800-FDA-1088. Where should I keep my  medication? This drug is given in a hospital or clinic and will not be stored at home. NOTE: This sheet is a summary. It may not cover all possible information. If you have questions about this medicine, talk to your doctor, pharmacist, or health care provider.  2022 [employer] Standard (2020-10-29 00:00:00)

## 2021-01-03 ENCOUNTER — Other Ambulatory Visit: Payer: Self-pay

## 2021-01-03 ENCOUNTER — Inpatient Hospital Stay: Payer: 59

## 2021-01-03 ENCOUNTER — Inpatient Hospital Stay (HOSPITAL_BASED_OUTPATIENT_CLINIC_OR_DEPARTMENT_OTHER): Payer: 59 | Admitting: Oncology

## 2021-01-03 ENCOUNTER — Encounter: Payer: Self-pay | Admitting: Oncology

## 2021-01-03 VITALS — BP 118/78 | HR 84 | Temp 98.0°F | Resp 16 | Ht 59.0 in | Wt 135.9 lb

## 2021-01-03 VITALS — BP 124/74 | HR 84

## 2021-01-03 DIAGNOSIS — R7989 Other specified abnormal findings of blood chemistry: Secondary | ICD-10-CM

## 2021-01-03 DIAGNOSIS — C50411 Malignant neoplasm of upper-outer quadrant of right female breast: Secondary | ICD-10-CM

## 2021-01-03 DIAGNOSIS — T451X5A Adverse effect of antineoplastic and immunosuppressive drugs, initial encounter: Secondary | ICD-10-CM | POA: Diagnosis not present

## 2021-01-03 DIAGNOSIS — Z79899 Other long term (current) drug therapy: Secondary | ICD-10-CM | POA: Diagnosis not present

## 2021-01-03 DIAGNOSIS — Z170 Estrogen receptor positive status [ER+]: Secondary | ICD-10-CM

## 2021-01-03 DIAGNOSIS — Z5111 Encounter for antineoplastic chemotherapy: Secondary | ICD-10-CM

## 2021-01-03 DIAGNOSIS — G620 Drug-induced polyneuropathy: Secondary | ICD-10-CM

## 2021-01-03 DIAGNOSIS — C50911 Malignant neoplasm of unspecified site of right female breast: Secondary | ICD-10-CM | POA: Diagnosis not present

## 2021-01-03 DIAGNOSIS — C773 Secondary and unspecified malignant neoplasm of axilla and upper limb lymph nodes: Secondary | ICD-10-CM | POA: Diagnosis not present

## 2021-01-03 DIAGNOSIS — Z5189 Encounter for other specified aftercare: Secondary | ICD-10-CM | POA: Diagnosis not present

## 2021-01-03 LAB — CBC WITH DIFFERENTIAL/PLATELET
Abs Immature Granulocytes: 0.02 10*3/uL (ref 0.00–0.07)
Basophils Absolute: 0 10*3/uL (ref 0.0–0.1)
Basophils Relative: 1 %
Eosinophils Absolute: 0.1 10*3/uL (ref 0.0–0.5)
Eosinophils Relative: 2 %
HCT: 27.7 % — ABNORMAL LOW (ref 36.0–46.0)
Hemoglobin: 9.5 g/dL — ABNORMAL LOW (ref 12.0–15.0)
Immature Granulocytes: 1 %
Lymphocytes Relative: 30 %
Lymphs Abs: 1.2 10*3/uL (ref 0.7–4.0)
MCH: 32.8 pg (ref 26.0–34.0)
MCHC: 34.3 g/dL (ref 30.0–36.0)
MCV: 95.5 fL (ref 80.0–100.0)
Monocytes Absolute: 0.5 10*3/uL (ref 0.1–1.0)
Monocytes Relative: 12 %
Neutro Abs: 2.1 10*3/uL (ref 1.7–7.7)
Neutrophils Relative %: 54 %
Platelets: 152 10*3/uL (ref 150–400)
RBC: 2.9 MIL/uL — ABNORMAL LOW (ref 3.87–5.11)
RDW: 18.2 % — ABNORMAL HIGH (ref 11.5–15.5)
WBC: 3.9 10*3/uL — ABNORMAL LOW (ref 4.0–10.5)
nRBC: 0 % (ref 0.0–0.2)

## 2021-01-03 LAB — COMPREHENSIVE METABOLIC PANEL
ALT: 139 U/L — ABNORMAL HIGH (ref 0–44)
AST: 63 U/L — ABNORMAL HIGH (ref 15–41)
Albumin: 3.9 g/dL (ref 3.5–5.0)
Alkaline Phosphatase: 82 U/L (ref 38–126)
Anion gap: 6 (ref 5–15)
BUN: 11 mg/dL (ref 8–23)
CO2: 28 mmol/L (ref 22–32)
Calcium: 9 mg/dL (ref 8.9–10.3)
Chloride: 105 mmol/L (ref 98–111)
Creatinine, Ser: 0.79 mg/dL (ref 0.44–1.00)
GFR, Estimated: >60 mL/min (ref >60)
Glucose, Bld: 125 mg/dL — ABNORMAL HIGH (ref 70–99)
Potassium: 3.8 mmol/L (ref 3.5–5.1)
Sodium: 139 mmol/L (ref 135–145)
Total Bilirubin: 0.5 mg/dL (ref 0.3–1.2)
Total Protein: 6.6 g/dL (ref 6.5–8.1)

## 2021-01-03 MED ORDER — SODIUM CHLORIDE 0.9% FLUSH
10.0000 mL | Freq: Once | INTRAVENOUS | Status: AC
  Administered 2021-01-03: 10 mL via INTRAVENOUS
  Filled 2021-01-03: qty 10

## 2021-01-03 MED ORDER — FAMOTIDINE 20 MG IN NS 100 ML IVPB
20.0000 mg | Freq: Once | INTRAVENOUS | Status: AC
  Administered 2021-01-03: 20 mg via INTRAVENOUS
  Filled 2021-01-03: qty 20

## 2021-01-03 MED ORDER — SODIUM CHLORIDE 0.9 % IV SOLN
10.0000 mg | Freq: Once | INTRAVENOUS | Status: AC
  Administered 2021-01-03: 10 mg via INTRAVENOUS
  Filled 2021-01-03: qty 10

## 2021-01-03 MED ORDER — SODIUM CHLORIDE 0.9 % IV SOLN
Freq: Once | INTRAVENOUS | Status: AC
Start: 2021-01-03 — End: 2021-01-03
  Filled 2021-01-03: qty 250

## 2021-01-03 MED ORDER — HEPARIN SOD (PORK) LOCK FLUSH 100 UNIT/ML IV SOLN
500.0000 [IU] | Freq: Once | INTRAVENOUS | Status: AC
  Administered 2021-01-03: 500 [IU] via INTRAVENOUS
  Filled 2021-01-03: qty 5

## 2021-01-03 MED ORDER — SODIUM CHLORIDE 0.9 % IV SOLN
65.0000 mg/m2 | Freq: Once | INTRAVENOUS | Status: AC
  Administered 2021-01-03: 102 mg via INTRAVENOUS
  Filled 2021-01-03: qty 17

## 2021-01-03 MED ORDER — DIPHENHYDRAMINE HCL 50 MG/ML IJ SOLN
12.5000 mg | Freq: Once | INTRAMUSCULAR | Status: AC
  Administered 2021-01-03: 12.5 mg via INTRAVENOUS
  Filled 2021-01-03: qty 1

## 2021-01-03 NOTE — Progress Notes (Signed)
Pt thinks she eats more than she should. Her left foot that she had previous surgery

## 2021-01-03 NOTE — Patient Instructions (Signed)
[facility]  Discharge Instructions: Thank you for choosing [facility] to provide your oncology and hematology care.  If you have a lab appointment with the Cancer Center, please go directly to the Cancer Center and check in at the registration area.  Wear comfortable clothing and clothing appropriate for easy access to any Portacath or PICC line.   We strive to give you quality time with your provider. You may need to reschedule your appointment if you arrive late (15 or more minutes).  Arriving late affects you and other patients whose appointments are after yours.  Also, if you miss three or more appointments without notifying the office, you may be dismissed from the clinic at the provider's discretion.      For prescription refill requests, have your pharmacy contact our office and allow 72 hours for refills to be completed.      To help prevent nausea and vomiting after your treatment, we encourage you to take your nausea medication as directed.  BELOW ARE SYMPTOMS THAT SHOULD BE REPORTED IMMEDIATELY: *FEVER GREATER THAN 100.4 F (38 C) OR HIGHER *CHILLS OR SWEATING *NAUSEA AND VOMITING THAT IS NOT CONTROLLED WITH YOUR NAUSEA MEDICATION *UNUSUAL SHORTNESS OF BREATH *UNUSUAL BRUISING OR BLEEDING *URINARY PROBLEMS (pain or burning when urinating, or frequent urination) *BOWEL PROBLEMS (unusual diarrhea, constipation, pain near the anus) TENDERNESS IN MOUTH AND THROAT WITH OR WITHOUT PRESENCE OF ULCERS (sore throat, sores in mouth, or a toothache) UNUSUAL RASH, SWELLING OR PAIN  UNUSUAL VAGINAL DISCHARGE OR ITCHING   Items with * indicate a potential emergency and should be followed up as soon as possible or go to the Emergency Department if any problems should occur.  Please show the CHEMOTHERAPY ALERT CARD or IMMUNOTHERAPY ALERT CARD at check-in to the Emergency Department and triage nurse.  Should you have questions after your  visit or need to cancel or reschedule your appointment, please contact [facility]  [phone] and follow the prompts.  Office hours are 8:00 a.m. to 4:30 p.m. Monday - Friday. Please note that voicemails left after 4:00 p.m. may not be returned until the following business day.  We are closed weekends and major holidays. You have access to a nurse at all times for urgent questions. Please call the main number to the clinic [phone] and follow the prompts.  For any non-urgent questions, you may also contact your provider using MyChart. We now offer e-Visits for anyone 18 and older to request care online for non-urgent symptoms. For details visit [email or web address].   Also download the MyChart app! Go to the app store, search "MyChart", open the app, select [facility], and log in with your MyChart username and password.  Due to Covid, a mask is required upon entering the hospital/clinic. If you do not have a mask, one will be given to you upon arrival. For doctor visits, patients may have 1 support person aged 18 or older with them. For treatment visits, patients cannot have anyone with them due to current Covid guidelines and our immunocompromised population.  
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
## 2021-01-03 NOTE — Progress Notes (Signed)
ALT>80, out of treatment parameters. Md notified. Per Dr. [name], ok to proceed with treatment today.

## 2021-01-06 ENCOUNTER — Other Ambulatory Visit: Payer: Self-pay

## 2021-01-06 ENCOUNTER — Encounter: Payer: Self-pay | Admitting: Obstetrics and Gynecology

## 2021-01-06 ENCOUNTER — Encounter: Payer: Self-pay | Admitting: Oncology

## 2021-01-06 ENCOUNTER — Inpatient Hospital Stay: Payer: 59

## 2021-01-06 DIAGNOSIS — C50411 Malignant neoplasm of upper-outer quadrant of right female breast: Secondary | ICD-10-CM

## 2021-01-06 DIAGNOSIS — Z79899 Other long term (current) drug therapy: Secondary | ICD-10-CM | POA: Diagnosis not present

## 2021-01-06 DIAGNOSIS — Z5111 Encounter for antineoplastic chemotherapy: Secondary | ICD-10-CM | POA: Diagnosis not present

## 2021-01-06 DIAGNOSIS — C50911 Malignant neoplasm of unspecified site of right female breast: Secondary | ICD-10-CM | POA: Diagnosis not present

## 2021-01-06 DIAGNOSIS — Z5189 Encounter for other specified aftercare: Secondary | ICD-10-CM | POA: Diagnosis not present

## 2021-01-06 DIAGNOSIS — Z170 Estrogen receptor positive status [ER+]: Secondary | ICD-10-CM

## 2021-01-06 DIAGNOSIS — C773 Secondary and unspecified malignant neoplasm of axilla and upper limb lymph nodes: Secondary | ICD-10-CM | POA: Diagnosis not present

## 2021-01-06 MED ORDER — FILGRASTIM-SNDZ 300 MCG/0.5ML IJ SOSY
300.0000 ug | PREFILLED_SYRINGE | Freq: Once | INTRAMUSCULAR | Status: AC
  Administered 2021-01-06: 300 ug via SUBCUTANEOUS
  Filled 2021-01-06: qty 0.5

## 2021-01-06 NOTE — Addendum Note (Signed)
Addended by: Randa Evens C on: 01/06/2021 12:07 PM   Modules accepted: Orders

## 2021-01-07 ENCOUNTER — Inpatient Hospital Stay: Payer: 59

## 2021-01-07 DIAGNOSIS — C50411 Malignant neoplasm of upper-outer quadrant of right female breast: Secondary | ICD-10-CM

## 2021-01-07 DIAGNOSIS — Z5111 Encounter for antineoplastic chemotherapy: Secondary | ICD-10-CM | POA: Diagnosis not present

## 2021-01-07 DIAGNOSIS — C773 Secondary and unspecified malignant neoplasm of axilla and upper limb lymph nodes: Secondary | ICD-10-CM | POA: Diagnosis not present

## 2021-01-07 DIAGNOSIS — Z79899 Other long term (current) drug therapy: Secondary | ICD-10-CM | POA: Diagnosis not present

## 2021-01-07 DIAGNOSIS — Z170 Estrogen receptor positive status [ER+]: Secondary | ICD-10-CM

## 2021-01-07 DIAGNOSIS — Z5189 Encounter for other specified aftercare: Secondary | ICD-10-CM | POA: Diagnosis not present

## 2021-01-07 DIAGNOSIS — C50911 Malignant neoplasm of unspecified site of right female breast: Secondary | ICD-10-CM | POA: Diagnosis not present

## 2021-01-07 MED ORDER — FILGRASTIM-SNDZ 300 MCG/0.5ML IJ SOSY
300.0000 ug | PREFILLED_SYRINGE | Freq: Once | INTRAMUSCULAR | Status: AC
  Administered 2021-01-07: 300 ug via SUBCUTANEOUS

## 2021-01-08 ENCOUNTER — Other Ambulatory Visit: Payer: 59

## 2021-01-08 ENCOUNTER — Ambulatory Visit: Payer: 59

## 2021-01-08 ENCOUNTER — Ambulatory Visit: Payer: 59 | Admitting: Oncology

## 2021-01-10 ENCOUNTER — Inpatient Hospital Stay: Payer: 59

## 2021-01-10 ENCOUNTER — Other Ambulatory Visit: Payer: Self-pay

## 2021-01-10 VITALS — BP 129/69 | HR 92 | Temp 98.5°F | Resp 18 | Wt 134.0 lb

## 2021-01-10 DIAGNOSIS — Z5111 Encounter for antineoplastic chemotherapy: Secondary | ICD-10-CM | POA: Diagnosis not present

## 2021-01-10 DIAGNOSIS — C50911 Malignant neoplasm of unspecified site of right female breast: Secondary | ICD-10-CM | POA: Diagnosis not present

## 2021-01-10 DIAGNOSIS — Z5189 Encounter for other specified aftercare: Secondary | ICD-10-CM | POA: Diagnosis not present

## 2021-01-10 DIAGNOSIS — C50411 Malignant neoplasm of upper-outer quadrant of right female breast: Secondary | ICD-10-CM

## 2021-01-10 DIAGNOSIS — C773 Secondary and unspecified malignant neoplasm of axilla and upper limb lymph nodes: Secondary | ICD-10-CM | POA: Diagnosis not present

## 2021-01-10 DIAGNOSIS — Z79899 Other long term (current) drug therapy: Secondary | ICD-10-CM | POA: Diagnosis not present

## 2021-01-10 LAB — CBC WITH DIFFERENTIAL/PLATELET
Abs Immature Granulocytes: 0.41 10*3/uL — ABNORMAL HIGH (ref 0.00–0.07)
Basophils Absolute: 0.1 10*3/uL (ref 0.0–0.1)
Basophils Relative: 1 %
Eosinophils Absolute: 0.1 10*3/uL (ref 0.0–0.5)
Eosinophils Relative: 1 %
HCT: 30.6 % — ABNORMAL LOW (ref 36.0–46.0)
Hemoglobin: 10.3 g/dL — ABNORMAL LOW (ref 12.0–15.0)
Immature Granulocytes: 5 %
Lymphocytes Relative: 20 %
Lymphs Abs: 1.6 10*3/uL (ref 0.7–4.0)
MCH: 32.7 pg (ref 26.0–34.0)
MCHC: 33.7 g/dL (ref 30.0–36.0)
MCV: 97.1 fL (ref 80.0–100.0)
Monocytes Absolute: 0.6 10*3/uL (ref 0.1–1.0)
Monocytes Relative: 8 %
Neutro Abs: 5.2 10*3/uL (ref 1.7–7.7)
Neutrophils Relative %: 65 %
Platelets: 244 10*3/uL (ref 150–400)
RBC: 3.15 MIL/uL — ABNORMAL LOW (ref 3.87–5.11)
RDW: 18.8 % — ABNORMAL HIGH (ref 11.5–15.5)
WBC: 8 10*3/uL (ref 4.0–10.5)
nRBC: 0.4 % — ABNORMAL HIGH (ref 0.0–0.2)

## 2021-01-10 LAB — COMPREHENSIVE METABOLIC PANEL
ALT: 76 U/L — ABNORMAL HIGH (ref 0–44)
AST: 32 U/L (ref 15–41)
Albumin: 4 g/dL (ref 3.5–5.0)
Alkaline Phosphatase: 103 U/L (ref 38–126)
Anion gap: 9 (ref 5–15)
BUN: 14 mg/dL (ref 8–23)
CO2: 27 mmol/L (ref 22–32)
Calcium: 8.9 mg/dL (ref 8.9–10.3)
Chloride: 101 mmol/L (ref 98–111)
Creatinine, Ser: 0.76 mg/dL (ref 0.44–1.00)
GFR, Estimated: >60 mL/min (ref >60)
Glucose, Bld: 133 mg/dL — ABNORMAL HIGH (ref 70–99)
Potassium: 3.5 mmol/L (ref 3.5–5.1)
Sodium: 137 mmol/L (ref 135–145)
Total Bilirubin: 0.3 mg/dL (ref 0.3–1.2)
Total Protein: 6.8 g/dL (ref 6.5–8.1)

## 2021-01-10 MED ORDER — SODIUM CHLORIDE 0.9 % IV SOLN
473.5000 mg | Freq: Once | INTRAVENOUS | Status: AC
  Administered 2021-01-10: 470 mg via INTRAVENOUS
  Filled 2021-01-10: qty 47

## 2021-01-10 MED ORDER — DIPHENHYDRAMINE HCL 50 MG/ML IJ SOLN
12.5000 mg | Freq: Once | INTRAMUSCULAR | Status: AC
  Administered 2021-01-10: 12.5 mg via INTRAVENOUS
  Filled 2021-01-10: qty 1

## 2021-01-10 MED ORDER — SODIUM CHLORIDE 0.9 % IV SOLN
65.0000 mg/m2 | Freq: Once | INTRAVENOUS | Status: AC
  Administered 2021-01-10: 102 mg via INTRAVENOUS
  Filled 2021-01-10: qty 17

## 2021-01-10 MED ORDER — SODIUM CHLORIDE 0.9 % IV SOLN
10.0000 mg | Freq: Once | INTRAVENOUS | Status: AC
  Administered 2021-01-10: 10 mg via INTRAVENOUS
  Filled 2021-01-10: qty 10

## 2021-01-10 MED ORDER — HEPARIN SOD (PORK) LOCK FLUSH 100 UNIT/ML IV SOLN
500.0000 [IU] | Freq: Once | INTRAVENOUS | Status: AC | PRN
  Filled 2021-01-10: qty 5

## 2021-01-10 MED ORDER — HEPARIN SOD (PORK) LOCK FLUSH 100 UNIT/ML IV SOLN
INTRAVENOUS | Status: AC
  Administered 2021-01-10: 500 [IU]
  Filled 2021-01-10: qty 10

## 2021-01-10 MED ORDER — PALONOSETRON HCL INJECTION 0.25 MG/5ML
0.2500 mg | Freq: Once | INTRAVENOUS | Status: AC
  Administered 2021-01-10: 0.25 mg via INTRAVENOUS
  Filled 2021-01-10: qty 5

## 2021-01-10 MED ORDER — SODIUM CHLORIDE 0.9 % IV SOLN
150.0000 mg | Freq: Once | INTRAVENOUS | Status: AC
  Administered 2021-01-10: 150 mg via INTRAVENOUS
  Filled 2021-01-10: qty 150

## 2021-01-10 MED ORDER — SODIUM CHLORIDE 0.9 % IV SOLN
200.0000 mg | Freq: Once | INTRAVENOUS | Status: AC
  Administered 2021-01-10: 200 mg via INTRAVENOUS
  Filled 2021-01-10: qty 8

## 2021-01-10 MED ORDER — SODIUM CHLORIDE 0.9 % IV SOLN
Freq: Once | INTRAVENOUS | Status: AC
  Filled 2021-01-10: qty 250

## 2021-01-10 MED ORDER — FAMOTIDINE 20 MG IN NS 100 ML IVPB
20.0000 mg | Freq: Once | INTRAVENOUS | Status: AC
  Administered 2021-01-10: 20 mg via INTRAVENOUS
  Filled 2021-01-10: qty 20

## 2021-01-10 NOTE — Patient Instructions (Addendum)
[facility]   Discharge Instructions: Thank you for choosing [facility] to provide your oncology and hematology care.  If you have a lab appointment with the [facility], please go directly to the [facility] and check in at the registration area.  Wear comfortable clothing and clothing appropriate for easy access to any Portacath or PICC line.   We strive to give you quality time with your provider. You may need to reschedule your appointment if you arrive late (15 or more minutes).  Arriving late affects you and other patients whose appointments are after yours.  Also, if you miss three or more appointments without notifying the office, you may be dismissed from the clinic at the provider's discretion.      For prescription refill requests, have your pharmacy contact our office and allow 72 hours for refills to be completed.    Today you received the following chemotherapy and/or immunotherapy agents - Keytruda, Taxol, Carboplatin      To help prevent nausea and vomiting after your treatment, we encourage you to take your nausea medication as directed.  BELOW ARE SYMPTOMS THAT SHOULD BE REPORTED IMMEDIATELY: *FEVER GREATER THAN 100.4 F (38 C) OR HIGHER *CHILLS OR SWEATING *NAUSEA AND VOMITING THAT IS NOT CONTROLLED WITH YOUR NAUSEA MEDICATION *UNUSUAL SHORTNESS OF BREATH *UNUSUAL BRUISING OR BLEEDING *URINARY PROBLEMS (pain or burning when urinating, or frequent urination) *BOWEL PROBLEMS (unusual diarrhea, constipation, pain near the anus) TENDERNESS IN MOUTH AND THROAT WITH OR WITHOUT PRESENCE OF ULCERS (sore throat, sores in mouth, or a toothache) UNUSUAL RASH, SWELLING OR PAIN  UNUSUAL VAGINAL DISCHARGE OR ITCHING   Items with * indicate a potential emergency and should be followed up as soon as possible or go to the Emergency Department if any problems should occur.  Please show the CHEMOTHERAPY ALERT CARD or IMMUNOTHERAPY  ALERT CARD at check-in to the Emergency Department and triage nurse.  Should you have questions after your visit or need to cancel or reschedule your appointment, please contact [facility]  [phone] and follow the prompts.  Office hours are 8:00 a.m. to 4:30 p.m. Monday - Friday. Please note that voicemails left after 4:00 p.m. may not be returned until the following business day.  We are closed weekends and major holidays. You have access to a nurse at all times for urgent questions. Please call the main number to the clinic [phone] and follow the prompts.  For any non-urgent questions, you may also contact your provider using MyChart. We now offer e-Visits for anyone 57 and older to request care online for non-urgent symptoms. For details visit [email or web address].   Also download the MyChart app! Go to the app store, search "MyChart", open the app, select [facility], and log in with your MyChart username and password.  Due to Covid, a mask is required upon entering the hospital/clinic. If you do not have a mask, one will be given to you upon arrival. For doctor visits, patients may have 1 support person aged 60 or older with them. For treatment visits, patients cannot have anyone with them due to current Covid guidelines and our immunocompromised population.   Diphenhydramine Injection What is this medication? DIPHENHYDRAMINE (dye fen HYE dra meen) treats the symptoms of allergic reactions. It may also be used to prevent and treat motion sickness or the symptoms of Parkinson disease. It works by blocking histamine, a substance released by the body during an allergic reaction. It  belongs to a group of medications called antihistamines. This medicine may be used for other purposes; ask your health care provider or pharmacist if you have questions. COMMON BRAND NAME(S): Benadryl What should I tell my care team before I take this medication? They need  to know if you have any of these conditions: Asthma or lung disease Glaucoma High blood pressure or heart disease Liver disease Pain or difficulty passing urine Prostate trouble Ulcers or other stomach problems An unusual or allergic reaction to diphenhydramine, antihistamines, other medications foods, dyes, or preservatives Pregnant or trying to get pregnant Breast-feeding How should I use this medication? This medication is for injection into a vein or a muscle. It is usually given in a hospital or clinic. If you get this medication at home, you will be taught how to prepare and give this medication. Use exactly as directed. Take your medication at regular intervals. Do not take your medication more often than directed. It is important that you put your used needles and syringes in a special sharps container. Do not put them in a trash can. If you do not have a sharps container, call your pharmacist or care team to get one. Talk to your care team regarding the use of this medication in children. While this medication may be prescribed for selected conditions, precautions do apply. This medication is not approved for use in newborns and premature babies. Patients over 18 years old may have a stronger reaction and need a smaller dose. Overdosage: If you think you have taken too much of this medicine contact a poison control center or emergency room at once. NOTE: This medicine is only for you. Do not share this medicine with others. What if I miss a dose? If you miss a dose, take it as soon as you can. If it is almost time for your next dose, take only that dose. Do not take double or extra doses. What may interact with this medication? Do not take this medication with any of the following: MAOIs like Carbex, Eldepryl, Marplan, Nardil, and Parnate This medication may also interact with the following: Alcohol Barbiturates, like phenobarbital Medications for bladder spasm like oxybutynin,  tolterodine Medications for blood pressure Medications for depression, anxiety, or psychotic disturbances Medications for movement abnormalities or Parkinson disease Medications for sleep Other medications for cold, cough or allergy Some medications for the stomach like chlordiazepoxide, dicyclomine This list may not describe all possible interactions. Give your health care provider a list of all the medicines, herbs, non-prescription drugs, or dietary supplements you use. Also tell them if you smoke, drink alcohol, or use illegal drugs. Some items may interact with your medicine. What should I watch for while using this medication? Your condition will be monitored carefully while you are receiving this medication. Tell your care team if your symptoms do not start to get better or if they get worse. You may get drowsy or dizzy. Do not drive, use machinery, or do anything that needs mental alertness until you know how this medication affects you. Do not stand or sit up quickly, especially if you are an older patient. This reduces the risk of dizzy or fainting spells. Alcohol may interfere with the effect of this medication. Avoid alcoholic drinks. Your mouth may get dry. Chewing sugarless gum or sucking hard candy, and drinking plenty of water may help. Contact your care team if the problem does not go away or is severe. What side effects may I notice from receiving this medication?  Side effects that you should report to your care team as soon as possible: Allergic reactions--skin rash, itching, hives, swelling of the face, lips, tongue, or throat Sudden eye pain or change in vision such as blurry vision, seeing halos around lights, vision loss Trouble passing urine Side effects that usually do not require medical attention (report to your care team if they continue or are bothersome): Constipation Drowsiness Dry mouth Headache Upset stomach This list may not describe all possible side effects.  Call your doctor for medical advice about side effects. You may report side effects to FDA at 1-800-FDA-1088. Where should I keep my medication? Keep out of the reach of children and pets. If you are using this medication at home, you will be instructed on how to store this medication. Throw away any unused medication after the expiration date on the label. NOTE: This sheet is a summary. It may not cover all possible information. If you have questions about this medicine, talk to your doctor, pharmacist, or health care provider.  2022 Elsevier/Gold Standard (2020-03-08 00:00:00)  Palonosetron Injection What is this medication? PALONOSETRON (pal oh NOE se tron) is used to prevent nausea and vomiting caused by chemotherapy. It also helps prevent delayed nausea and vomiting that may occur a few days after your treatment. This medicine may be used for other purposes; ask your health care provider or pharmacist if you have questions. COMMON BRAND NAME(S): Aloxi What should I tell my care team before I take this medication? They need to know if you have any of these conditions: an unusual or allergic reaction to palonosetron, dolasetron, granisetron, ondansetron, other medicines, foods, dyes, or preservatives pregnant or trying to get pregnant breast-feeding How should I use this medication? This medicine is for infusion into a vein. It is given by a health care professional in a hospital or clinic setting. Talk to your pediatrician regarding the use of this medicine in children. While this drug may be prescribed for children as young as 1 month for selected conditions, precautions do apply. Overdosage: If you think you have taken too much of this medicine contact a poison control center or emergency room at once. NOTE: This medicine is only for you. Do not share this medicine with others. What if I miss a dose? This does not apply. What may interact with this medication? certain medicines for  depression, anxiety, or psychotic disturbances fentanyl linezolid MAOIs like Carbex, Eldepryl, Marplan, Nardil, and Parnate methylene blue (injected into a vein) tramadol This list may not describe all possible interactions. Give your health care provider a list of all the medicines, herbs, non-prescription drugs, or dietary supplements you use. Also tell them if you smoke, drink alcohol, or use illegal drugs. Some items may interact with your medicine. What should I watch for while using this medication? Your condition will be monitored carefully while you are receiving this medicine. What side effects may I notice from receiving this medication? Side effects that you should report to your doctor or health care professional as soon as possible: allergic reactions like skin rash, itching or hives, swelling of the face, lips, or tongue breathing problems confusion dizziness fast, irregular heartbeat fever and chills loss of balance or coordination seizures sweating swelling of the hands and feet tremors unusually weak or tired Side effects that usually do not require medical attention (report to your doctor or health care professional if they continue or are bothersome): constipation or diarrhea headache This list may not describe all possible side  effects. Call your doctor for medical advice about side effects. You may report side effects to FDA at 1-800-FDA-1088. Where should I keep my medication? This drug is given in a hospital or clinic and will not be stored at home. NOTE: This sheet is a summary. It may not cover all possible information. If you have questions about this medicine, talk to your doctor, pharmacist, or health care provider.  2022 Elsevier/Gold Standard (2012-12-16 00:00:00)  Famotidine Solution for Injection What is this medication? FAMOTIDINE (fa MOE ti deen) treats stomach ulcers, reflux disease, or other conditions that cause too much stomach acid. It works  by reducing the amount of acid in the stomach. This medicine may be used for other purposes; ask your health care provider or pharmacist if you have questions. COMMON BRAND NAME(S): Pepcid What should I tell my care team before I take this medication? They need to know if you have any of these conditions: Kidney or liver disease An unusual or allergic reaction to famotidine, other medications, foods, dyes, or preservatives Pregnant or trying to get pregnant Breast-feeding How should I use this medication? This medication is for infusion into a vein. It is given in a hospital or clinic setting. Talk to your care team regarding the use of this medication in children. Special care may be needed. Overdosage: If you think you have taken too much of this medicine contact a poison control center or emergency room at once. NOTE: This medicine is only for you. Do not share this medicine with others. What if I miss a dose? This does not apply. What may interact with this medication? Delavirdine Itraconazole Ketoconazole This list may not describe all possible interactions. Give your health care provider a list of all the medicines, herbs, non-prescription drugs, or dietary supplements you use. Also tell them if you smoke, drink alcohol, or use illegal drugs. Some items may interact with your medicine. What should I watch for while using this medication? Tell your doctor or health care professional if your condition does not start to get better or gets worse. Do not take with aspirin, ibuprofen, or other antiinflammatory medications. These can aggravate your condition. Do not smoke cigarettes or drink alcohol. These increase irritation in your stomach and can increase the time it will take for ulcers to heal. Cigarettes and alcohol can also worsen acid reflux or heartburn. If you get black, tarry stools or vomit up what looks like coffee grounds, call your doctor or health care professional at once.  You may have a bleeding ulcer. This medication may cause a decrease in vitamin B12. Make sure that you get enough vitamin B12 while you are taking this medication. Discuss the foods you eat and the vitamins you take with your care team. What side effects may I notice from receiving this medication? Side effects that you should report to your care team as soon as possible: Allergic reactions--skin rash, itching, hives, swelling of the face, lips, tongue, or throat Confusion Hallucinations Side effects that usually do not require medical attention (report to your care team if they continue or are bothersome): Constipation Diarrhea Dizziness Headache This list may not describe all possible side effects. Call your doctor for medical advice about side effects. You may report side effects to FDA at 1-800-FDA-1088. Where should I keep my medication? This medication is given in a hospital or clinic. You will not be given this medication to store at home. NOTE: This sheet is a summary. It may not cover all  possible information. If you have questions about this medicine, talk to your doctor, pharmacist, or health care provider.  2022 Elsevier/Gold Standard (2020-02-13 00:00:00)   Dexamethasone injection What is this medication? DEXAMETHASONE (dex a METH a sone) is a corticosteroid. It is used to treat inflammation of the skin, joints, lungs, and other organs. Common conditions treated include asthma, allergies, and arthritis. It is also used for other conditions, like blood disorders and diseases of the adrenal glands. This medicine may be used for other purposes; ask your health care provider or pharmacist if you have questions. COMMON BRAND NAME(S): Decadron, DoubleDex, ReadySharp Dexamethasone, Simplist Dexamethasone, Solurex What should I tell my care team before I take this medication? They need to know if you have any of these conditions: Cushing's syndrome diabetes glaucoma heart  disease high blood pressure infection like herpes, measles, tuberculosis, or chickenpox kidney disease liver disease mental illness myasthenia gravis osteoporosis previous heart attack seizures stomach or intestine problems thyroid disease an unusual or allergic reaction to dexamethasone, corticosteroids, other medicines, lactose, foods, dyes, or preservatives pregnant or trying to get pregnant breast-feeding How should I use this medication? This medicine is for injection into a muscle, joint, lesion, soft tissue, or vein. It is given by a health care professional in a hospital or clinic setting. Talk to your pediatrician regarding the use of this medicine in children. Special care may be needed. Overdosage: If you think you have taken too much of this medicine contact a poison control center or emergency room at once. NOTE: This medicine is only for you. Do not share this medicine with others. What if I miss a dose? This may not apply. If you are having a series of injections over a prolonged period, try not to miss an appointment. Call your doctor or health care professional to reschedule if you are unable to keep an appointment. What may interact with this medication? Do not take this medicine with any of the following medications: live virus vaccines This medicine may also interact with the following medications: aminoglutethimide amphotericin B aspirin and aspirin-like medicines certain antibiotics like erythromycin, clarithromycin, and troleandomycin certain antivirals for HIV or hepatitis certain medicines for seizures like carbamazepine, phenobarbital, phenytoin certain medicines to treat myasthenia gravis cholestyramine cyclosporine digoxin diuretics ephedrine female hormones, like estrogen or progestins and birth control pills insulin or other medicines for diabetes isoniazid ketoconazole medicines that relax muscles for surgery mifepristone NSAIDs, medicines  for pain and inflammation, like ibuprofen or naproxen rifampin skin tests for allergies thalidomide vaccines warfarin This list may not describe all possible interactions. Give your health care provider a list of all the medicines, herbs, non-prescription drugs, or dietary supplements you use. Also tell them if you smoke, drink alcohol, or use illegal drugs. Some items may interact with your medicine. What should I watch for while using this medication? Visit your health care professional for regular checks on your progress. Tell your health care professional if your symptoms do not start to get better or if they get worse. Your condition will be monitored carefully while you are receiving this medicine. Wear a medical ID bracelet or chain. Carry a card that describes your disease and details of your medicine and dosage times. This medicine may increase your risk of getting an infection. Call your health care professional for advice if you get a fever, chills, or sore throat, or other symptoms of a cold or flu. Do not treat yourself. Try to avoid being around people who are sick. Call  your health care professional if you are around anyone with measles, chickenpox, or if you develop sores or blisters that do not heal properly. If you are going to need surgery or other procedures, tell your doctor or health care professional that you have taken this medicine within the last 12 months. Ask your doctor or health care professional about your diet. You may need to lower the amount of salt you eat. This medicine may increase blood sugar. Ask your healthcare provider if changes in diet or medicines are needed if you have diabetes. What side effects may I notice from receiving this medication? Side effects that you should report to your doctor or health care professional as soon as possible: allergic reactions like skin rash, itching or hives, swelling of the face, lips, or tongue bloody or black, tarry  stools changes in emotions or moods changes in vision confusion, excitement, restlessness depressed mood eye pain hallucinations muscle weakness severe or sudden stomach or belly pain signs and symptoms of high blood sugar such as being more thirsty or hungry or having to urinate more than normal. You may also feel very tired or have blurry vision. signs and symptoms of infection like fever; chills; cough; sore throat; pain or trouble passing urine swelling of ankles, feet unusual bruising or bleeding wounds that do not heal Side effects that usually do not require medical attention (report to your doctor or health care professional if they continue or are bothersome): increased appetite increased growth of face or body hair headache nausea, vomiting pain, redness, or irritation at site where injected skin problems, acne, thin and shiny skin trouble sleeping weight gain This list may not describe all possible side effects. Call your doctor for medical advice about side effects. You may report side effects to FDA at 1-800-FDA-1088. Where should I keep my medication? This medicine is given in a hospital or clinic and will not be stored at home. NOTE: This sheet is a summary. It may not cover all possible information. If you have questions about this medicine, talk to your doctor, pharmacist, or health care provider.  2022 Elsevier/Gold Standard (2018-08-25 00:00:00)  Fosaprepitant injection What is this medication? FOSAPREPITANT (fos ap RE pi tant) is used together with other medicines to prevent nausea and vomiting caused by cancer treatment (chemotherapy). This medicine may be used for other purposes; ask your health care provider or pharmacist if you have questions. COMMON BRAND NAME(S): Emend What should I tell my care team before I take this medication? They need to know if you have any of these conditions: liver disease an unusual or allergic reaction to fosaprepitant,  aprepitant, medicines, foods, dyes, or preservatives pregnant or trying to get pregnant breast-feeding How should I use this medication? This medicine is for injection into a vein. It is given by a health care professional in a hospital or clinic setting. Talk to your pediatrician regarding the use of this medicine in children. While this drug may be prescribed for children as young as 6 months for selected conditions, precautions do apply. Overdosage: If you think you have taken too much of this medicine contact a poison control center or emergency room at once. NOTE: This medicine is only for you. Do not share this medicine with others. What if I miss a dose? This does not apply. What may interact with this medication? Do not take this medicine with any of these medicines: cisapride flibanserin lomitapide pimozide This medicine may also interact with the following medications: diltiazem female  hormones, like estrogens or progestins and birth control pills medicines for fungal infections like ketoconazole and itraconazole medicines for HIV medicines for seizures or to control epilepsy like carbamazepine or phenytoin medicines used for sleep or anxiety disorders like alprazolam, diazepam, or midazolam nefazodone paroxetine ranolazine rifampin some chemotherapy medications like etoposide, ifosfamide, vinblastine, vincristine some antibiotics like clarithromycin, erythromycin, troleandomycin steroid medicines like dexamethasone or methylprednisolone tolbutamide warfarin This list may not describe all possible interactions. Give your health care provider a list of all the medicines, herbs, non-prescription drugs, or dietary supplements you use. Also tell them if you smoke, drink alcohol, or use illegal drugs. Some items may interact with your medicine. What should I watch for while using this medication? Do not take this medicine if you already have nausea and vomiting. Ask your health  care provider what to do if you already have nausea. Birth control pills and other methods of hormonal contraception (for example, IUD or patch) may not work properly while you are taking this medicine. Use an extra method of birth control during treatment and for 1 month after your last dose of fosaprepitant. This medicine should not be used continuously for a long time. Visit your doctor or health care professional for regular check-ups. This medicine may change your liver function blood test results. What side effects may I notice from receiving this medication? Side effects that you should report to your doctor or health care professional as soon as possible: allergic reactions like skin rash, itching or hives, swelling of the face, lips, or tongue breathing problems changes in heart rhythm high or low blood pressure pain, redness, or irritation at site where injected rectal bleeding serious dizziness or disorientation, confusion sharp or severe stomach pain sharp pain in your leg Side effects that usually do not require medical attention (report to your doctor or health care professional if they continue or are bothersome): constipation or diarrhea hair loss headache hiccups loss of appetite nausea upset stomach tiredness This list may not describe all possible side effects. Call your doctor for medical advice about side effects. You may report side effects to FDA at 1-800-FDA-1088. Where should I keep my medication? This drug is given in a hospital or clinic and will not be stored at home. NOTE: This sheet is a summary. It may not cover all possible information. If you have questions about this medicine, talk to your doctor, pharmacist, or health care provider.  2022 [employer] Standard (2016-05-29 00:00:00)  Pembrolizumab injection What is this medication? PEMBROLIZUMAB (pem broe liz ue mab) is a monoclonal antibody. It is used to treat certain types of cancer. This  medicine may be used for other purposes; ask your health care provider or pharmacist if you have questions. COMMON BRAND NAME(S): Keytruda What should I tell my care team before I take this medication? They need to know if you have any of these conditions: autoimmune diseases like Crohn's disease, ulcerative colitis, or lupus have had or planning to have an allogeneic stem cell transplant (uses someone else's stem cells) history of organ transplant history of chest radiation nervous system problems like myasthenia gravis or Guillain-Barre syndrome an unusual or allergic reaction to pembrolizumab, other medicines, foods, dyes, or preservatives pregnant or trying to get pregnant breast-feeding How should I use this medication? This medicine is for infusion into a vein. It is given by a health care professional in a hospital or clinic setting. A special MedGuide will be given to you before each treatment. Be sure to  read this information carefully each time. Talk to your pediatrician regarding the use of this medicine in children. While this drug may be prescribed for children as young as 6 months for selected conditions, precautions do apply. Overdosage: If you think you have taken too much of this medicine contact a poison control center or emergency room at once. NOTE: This medicine is only for you. Do not share this medicine with others. What if I miss a dose? It is important not to miss your dose. Call your doctor or health care professional if you are unable to keep an appointment. What may interact with this medication? Interactions have not been studied. This list may not describe all possible interactions. Give your health care provider a list of all the medicines, herbs, non-prescription drugs, or dietary supplements you use. Also tell them if you smoke, drink alcohol, or use illegal drugs. Some items may interact with your medicine. What should I watch for while using this  medication? Your condition will be monitored carefully while you are receiving this medicine. You may need blood work done while you are taking this medicine. Do not become pregnant while taking this medicine or for 4 months after stopping it. Women should inform their doctor if they wish to become pregnant or think they might be pregnant. There is a potential for serious side effects to an unborn child. Talk to your health care professional or pharmacist for more information. Do not breast-feed an infant while taking this medicine or for 4 months after the last dose. What side effects may I notice from receiving this medication? Side effects that you should report to your doctor or health care professional as soon as possible: allergic reactions like skin rash, itching or hives, swelling of the face, lips, or tongue bloody or black, tarry breathing problems changes in vision chest pain chills confusion constipation cough diarrhea dizziness or feeling faint or lightheaded fast or irregular heartbeat fever flushing joint pain low blood counts - this medicine may decrease the number of Amanda Skinner blood cells, red blood cells and platelets. You may be at increased risk for infections and bleeding. muscle pain muscle weakness pain, tingling, numbness in the hands or feet persistent headache redness, blistering, peeling or loosening of the skin, including inside the mouth signs and symptoms of high blood sugar such as dizziness; dry mouth; dry skin; fruity breath; nausea; stomach pain; increased hunger or thirst; increased urination signs and symptoms of kidney injury like trouble passing urine or change in the amount of urine signs and symptoms of liver injury like dark urine, light-colored stools, loss of appetite, nausea, right upper belly pain, yellowing of the eyes or skin sweating swollen lymph nodes weight loss Side effects that usually do not require medical attention (report to your  doctor or health care professional if they continue or are bothersome): decreased appetite hair loss tiredness This list may not describe all possible side effects. Call your doctor for medical advice about side effects. You may report side effects to FDA at 1-800-FDA-1088. Where should I keep my medication? This drug is given in a hospital or clinic and will not be stored at home. NOTE: This sheet is a summary. It may not cover all possible information. If you have questions about this medicine, talk to your doctor, pharmacist, or health care provider.  2022 [employer] Standard (2020-10-29 00:00:00)  Paclitaxel injection What is this medication? PACLITAXEL (PAK li TAX el) is a chemotherapy drug. It targets fast dividing cells, like  cancer cells, and causes these cells to die. This medicine is used to treat ovarian cancer, breast cancer, lung cancer, Kaposi's sarcoma, and other cancers. This medicine may be used for other purposes; ask your health care provider or pharmacist if you have questions. COMMON BRAND NAME(S): Onxol, Taxol What should I tell my care team before I take this medication? They need to know if you have any of these conditions: history of irregular heartbeat liver disease low blood counts, like low Amanda Skinner cell, platelet, or red cell counts lung or breathing disease, like asthma tingling of the fingers or toes, or other nerve disorder an unusual or allergic reaction to paclitaxel, alcohol, polyoxyethylated castor oil, other chemotherapy, other medicines, foods, dyes, or preservatives pregnant or trying to get pregnant breast-feeding How should I use this medication? This drug is given as an infusion into a vein. It is administered in a hospital or clinic by a specially trained health care professional. Talk to your pediatrician regarding the use of this medicine in children. Special care may be needed. Overdosage: If you think you have taken too much of this medicine  contact a poison control center or emergency room at once. NOTE: This medicine is only for you. Do not share this medicine with others. What if I miss a dose? It is important not to miss your dose. Call your doctor or health care professional if you are unable to keep an appointment. What may interact with this medication? Do not take this medicine with any of the following medications: live virus vaccines This medicine may also interact with the following medications: antiviral medicines for hepatitis, HIV or AIDS certain antibiotics like erythromycin and clarithromycin certain medicines for fungal infections like ketoconazole and itraconazole certain medicines for seizures like carbamazepine, phenobarbital, phenytoin gemfibrozil nefazodone rifampin St. John's wort This list may not describe all possible interactions. Give your health care provider a list of all the medicines, herbs, non-prescription drugs, or dietary supplements you use. Also tell them if you smoke, drink alcohol, or use illegal drugs. Some items may interact with your medicine. What should I watch for while using this medication? Your condition will be monitored carefully while you are receiving this medicine. You will need important blood work done while you are taking this medicine. This medicine can cause serious allergic reactions. To reduce your risk you will need to take other medicine(s) before treatment with this medicine. If you experience allergic reactions like skin rash, itching or hives, swelling of the face, lips, or tongue, tell your doctor or health care professional right away. In some cases, you may be given additional medicines to help with side effects. Follow all directions for their use. This drug may make you feel generally unwell. This is not uncommon, as chemotherapy can affect healthy cells as well as cancer cells. Report any side effects. Continue your course of treatment even though you feel ill  unless your doctor tells you to stop. Call your doctor or health care professional for advice if you get a fever, chills or sore throat, or other symptoms of a cold or flu. Do not treat yourself. This drug decreases your body's ability to fight infections. Try to avoid being around people who are sick. This medicine may increase your risk to bruise or bleed. Call your doctor or health care professional if you notice any unusual bleeding. Be careful brushing and flossing your teeth or using a toothpick because you may get an infection or bleed more easily. If you  have any dental work done, tell your dentist you are receiving this medicine. Avoid taking products that contain aspirin, acetaminophen, ibuprofen, naproxen, or ketoprofen unless instructed by your doctor. These medicines may hide a fever. Do not become pregnant while taking this medicine. Women should inform their doctor if they wish to become pregnant or think they might be pregnant. There is a potential for serious side effects to an unborn child. Talk to your health care professional or pharmacist for more information. Do not breast-feed an infant while taking this medicine. Men are advised not to father a child while receiving this medicine. This product may contain alcohol. Ask your pharmacist or healthcare provider if this medicine contains alcohol. Be sure to tell all healthcare providers you are taking this medicine. Certain medicines, like metronidazole and disulfiram, can cause an unpleasant reaction when taken with alcohol. The reaction includes flushing, headache, nausea, vomiting, sweating, and increased thirst. The reaction can last from 30 minutes to several hours. What side effects may I notice from receiving this medication? Side effects that you should report to your doctor or health care professional as soon as possible: allergic reactions like skin rash, itching or hives, swelling of the face, lips, or tongue breathing  problems changes in vision fast, irregular heartbeat high or low blood pressure mouth sores pain, tingling, numbness in the hands or feet signs of decreased platelets or bleeding - bruising, pinpoint red spots on the skin, black, tarry stools, blood in the urine signs of decreased red blood cells - unusually weak or tired, feeling faint or lightheaded, falls signs of infection - fever or chills, cough, sore throat, pain or difficulty passing urine signs and symptoms of liver injury like dark yellow or brown urine; general ill feeling or flu-like symptoms; light-colored stools; loss of appetite; nausea; right upper belly pain; unusually weak or tired; yellowing of the eyes or skin swelling of the ankles, feet, hands unusually slow heartbeat Side effects that usually do not require medical attention (report to your doctor or health care professional if they continue or are bothersome): diarrhea hair loss loss of appetite muscle or joint pain nausea, vomiting pain, redness, or irritation at site where injected tiredness This list may not describe all possible side effects. Call your doctor for medical advice about side effects. You may report side effects to FDA at 1-800-FDA-1088. Where should I keep my medication? This drug is given in a hospital or clinic and will not be stored at home. NOTE: This sheet is a summary. It may not cover all possible information. If you have questions about this medicine, talk to your doctor, pharmacist, or health care provider.  2022 [employer] Standard (2020-10-29 00:00:00)  Carboplatin injection What is this medication? CARBOPLATIN (KAR boe pla tin) is a chemotherapy drug. It targets fast dividing cells, like cancer cells, and causes these cells to die. This medicine is used to treat ovarian cancer and many other cancers. This medicine may be used for other purposes; ask your health care provider or pharmacist if you have questions. COMMON BRAND  NAME(S): Paraplatin What should I tell my care team before I take this medication? They need to know if you have any of these conditions: blood disorders hearing problems kidney disease recent or ongoing radiation therapy an unusual or allergic reaction to carboplatin, cisplatin, other chemotherapy, other medicines, foods, dyes, or preservatives pregnant or trying to get pregnant breast-feeding How should I use this medication? This drug is usually given as an infusion into a  vein. It is administered in a hospital or clinic by a specially trained health care professional. Talk to your pediatrician regarding the use of this medicine in children. Special care may be needed. Overdosage: If you think you have taken too much of this medicine contact a poison control center or emergency room at once. NOTE: This medicine is only for you. Do not share this medicine with others. What if I miss a dose? It is important not to miss a dose. Call your doctor or health care professional if you are unable to keep an appointment. What may interact with this medication? medicines for seizures medicines to increase blood counts like filgrastim, pegfilgrastim, sargramostim some antibiotics like amikacin, gentamicin, neomycin, streptomycin, tobramycin vaccines Talk to your doctor or health care professional before taking any of these medicines: acetaminophen aspirin ibuprofen ketoprofen naproxen This list may not describe all possible interactions. Give your health care provider a list of all the medicines, herbs, non-prescription drugs, or dietary supplements you use. Also tell them if you smoke, drink alcohol, or use illegal drugs. Some items may interact with your medicine. What should I watch for while using this medication? Your condition will be monitored carefully while you are receiving this medicine. You will need important blood work done while you are taking this medicine. This drug may make you  feel generally unwell. This is not uncommon, as chemotherapy can affect healthy cells as well as cancer cells. Report any side effects. Continue your course of treatment even though you feel ill unless your doctor tells you to stop. In some cases, you may be given additional medicines to help with side effects. Follow all directions for their use. Call your doctor or health care professional for advice if you get a fever, chills or sore throat, or other symptoms of a cold or flu. Do not treat yourself. This drug decreases your body's ability to fight infections. Try to avoid being around people who are sick. This medicine may increase your risk to bruise or bleed. Call your doctor or health care professional if you notice any unusual bleeding. Be careful brushing and flossing your teeth or using a toothpick because you may get an infection or bleed more easily. If you have any dental work done, tell your dentist you are receiving this medicine. Avoid taking products that contain aspirin, acetaminophen, ibuprofen, naproxen, or ketoprofen unless instructed by your doctor. These medicines may hide a fever. Do not become pregnant while taking this medicine. Women should inform their doctor if they wish to become pregnant or think they might be pregnant. There is a potential for serious side effects to an unborn child. Talk to your health care professional or pharmacist for more information. Do not breast-feed an infant while taking this medicine. What side effects may I notice from receiving this medication? Side effects that you should report to your doctor or health care professional as soon as possible: allergic reactions like skin rash, itching or hives, swelling of the face, lips, or tongue signs of infection - fever or chills, cough, sore throat, pain or difficulty passing urine signs of decreased platelets or bleeding - bruising, pinpoint red spots on the skin, black, tarry stools, nosebleeds signs of  decreased red blood cells - unusually weak or tired, fainting spells, lightheadedness breathing problems changes in hearing changes in vision chest pain high blood pressure low blood counts - This drug may decrease the number of Amanda Skinner blood cells, red blood cells and platelets. You may be  at increased risk for infections and bleeding. nausea and vomiting pain, swelling, redness or irritation at the injection site pain, tingling, numbness in the hands or feet problems with balance, talking, walking trouble passing urine or change in the amount of urine Side effects that usually do not require medical attention (report to your doctor or health care professional if they continue or are bothersome): hair loss loss of appetite metallic taste in the mouth or changes in taste This list may not describe all possible side effects. Call your doctor for medical advice about side effects. You may report side effects to FDA at 1-800-FDA-1088. Where should I keep my medication? This drug is given in a hospital or clinic and will not be stored at home. NOTE: This sheet is a summary. It may not cover all possible information. If you have questions about this medicine, talk to your doctor, pharmacist, or health care provider.  2022 [employer] Standard (2007-07-20 00:00:00)

## 2021-01-10 NOTE — Progress Notes (Signed)
Pt received keytruda, taxol, carboplatin infusion in clinic today. Tolerated well. No complaints at d/c.

## 2021-01-13 ENCOUNTER — Encounter: Payer: Self-pay | Admitting: Oncology

## 2021-01-15 ENCOUNTER — Encounter: Payer: Self-pay | Admitting: Oncology

## 2021-01-20 ENCOUNTER — Ambulatory Visit: Payer: 59 | Admitting: Dermatology

## 2021-01-20 ENCOUNTER — Other Ambulatory Visit: Payer: 59

## 2021-01-20 ENCOUNTER — Telehealth: Payer: Self-pay | Admitting: *Deleted

## 2021-01-20 ENCOUNTER — Ambulatory Visit: Payer: 59

## 2021-01-20 ENCOUNTER — Ambulatory Visit: Payer: 59 | Admitting: Oncology

## 2021-01-20 ENCOUNTER — Encounter: Payer: Self-pay | Admitting: Oncology

## 2021-01-20 ENCOUNTER — Other Ambulatory Visit: Payer: Self-pay

## 2021-01-20 DIAGNOSIS — L814 Other melanin hyperpigmentation: Secondary | ICD-10-CM | POA: Diagnosis not present

## 2021-01-20 DIAGNOSIS — D229 Melanocytic nevi, unspecified: Secondary | ICD-10-CM

## 2021-01-20 DIAGNOSIS — L578 Other skin changes due to chronic exposure to nonionizing radiation: Secondary | ICD-10-CM

## 2021-01-20 DIAGNOSIS — D1801 Hemangioma of skin and subcutaneous tissue: Secondary | ICD-10-CM | POA: Diagnosis not present

## 2021-01-20 DIAGNOSIS — Z1283 Encounter for screening for malignant neoplasm of skin: Secondary | ICD-10-CM

## 2021-01-20 DIAGNOSIS — L853 Xerosis cutis: Secondary | ICD-10-CM | POA: Diagnosis not present

## 2021-01-20 DIAGNOSIS — L570 Actinic keratosis: Secondary | ICD-10-CM

## 2021-01-20 DIAGNOSIS — D2372 Other benign neoplasm of skin of left lower limb, including hip: Secondary | ICD-10-CM | POA: Diagnosis not present

## 2021-01-20 DIAGNOSIS — L821 Other seborrheic keratosis: Secondary | ICD-10-CM | POA: Diagnosis not present

## 2021-01-20 NOTE — Patient Instructions (Addendum)
Gentle Skin Care Guide  1. Bathe no more than once a day.  2. Avoid bathing in hot water  3. Use a mild soap like Dove, Vanicream, Cetaphil, CeraVe. Can use Lever 2000 or Cetaphil antibacterial soap  4. Use soap only where you need it. On most days, use it under your arms, between your legs, and on your feet. Let the water rinse other areas unless visibly dirty.  5. When you get out of the bath/shower, use a towel to gently blot your skin dry, don't rub it.  6. While your skin is still a little damp, apply a moisturizing cream such as Vanicream, CeraVe, Cetaphil, Eucerin, Sarna lotion or plain Vaseline Jelly. For hands apply Neutrogena [place] Hand Cream or Excipial Hand Cream.  7. Reapply moisturizer any time you start to itch or feel dry.  8. Sometimes using free and clear laundry detergents can be helpful. Fabric softener sheets should be avoided. Downy Free & Gentle liquid, or any liquid fabric softener that is free of dyes and perfumes, it acceptable to use  9. If your doctor has given you prescription creams you may apply moisturizers over them      Actinic keratoses are precancerous spots that appear secondary to cumulative UV radiation exposure/sun exposure over time. They are chronic with expected duration over 1 year. A portion of actinic keratoses will progress to squamous cell carcinoma of the skin. It is not possible to reliably predict which spots will progress to skin cancer and so treatment is recommended to prevent development of skin cancer.  Recommend daily broad spectrum sunscreen SPF 30+ to sun-exposed areas, reapply every 2 hours as needed.  Recommend staying in the shade or wearing long sleeves, sun glasses (UVA+UVB protection) and wide brim hats (4-inch brim around the entire circumference of the hat). Call for new or changing lesions.   Cryotherapy Aftercare  Wash gently with soap and water everyday.   Apply Vaseline and Band-Aid daily until healed.       Melanoma ABCDEs  Melanoma is the most dangerous type of skin cancer, and is the leading cause of death from skin disease.  You are more likely to develop melanoma if you: Have light-colored skin, light-colored eyes, or red or blond hair Spend a lot of time in the sun Tan regularly, either outdoors or in a tanning bed Have had blistering sunburns, especially during childhood Have a close family member who has had a melanoma Have atypical moles or large birthmarks  Early detection of melanoma is key since treatment is typically straightforward and cure rates are extremely high if we catch it early.   The first sign of melanoma is often a change in a mole or a new dark spot.  The ABCDE system is a way of remembering the signs of melanoma.  A for asymmetry:  The two halves do not match. B for border:  The edges of the growth are irregular. C for color:  A mixture of colors are present instead of an even brown color. D for diameter:  Melanomas are usually (but not always) greater than 23mm - the size of a pencil eraser. E for evolution:  The spot keeps changing in size, shape, and color.  Please check your skin once per month between visits. You can use a small mirror in front and a large mirror behind you to keep an eye on the back side or your body.   If you see any new or changing lesions before your next  follow-up, please call to schedule a visit.  Please continue daily skin protection including broad spectrum sunscreen SPF 30+ to sun-exposed areas, reapplying every 2 hours as needed when you're outdoors.   Staying in the shade or wearing long sleeves, sun glasses (UVA+UVB protection) and wide brim hats (4-inch brim around the entire circumference of the hat) are also recommended for sun protection.    If You Need Anything After Your Visit  If you have any questions or concerns for your doctor, please call our main line at [phone] and press option 4 to reach your doctor's  medical assistant. If no one answers, please leave a voicemail as directed and we will return your call as soon as possible. Messages left after 4 pm will be answered the following business day.   You may also send [place] a message via [place]. We typically respond to MyChart messages within 1-2 business days.  For prescription refills, please ask your pharmacy to contact our office. Our fax number is [phone].  If you have an urgent issue when the clinic is closed that cannot wait until the next business day, you can page your doctor at the number below.    Please note that while we do our best to be available for urgent issues outside of office hours, we are not available 24/7.   If you have an urgent issue and are unable to reach [place], you may choose to seek medical care at your doctor's office, retail clinic, urgent care center, or emergency room.  If you have a medical emergency, please immediately call 911 or go to the emergency department.  Pager Numbers  - Dr. Nehemiah Massed: [phone]  - Dr. Laurence Ferrari: [phone]  - Dr. Nicole Kindred: [phone]  In the event of inclement weather, please call our main line at [phone] for an update on the status of any delays or closures.  Dermatology Medication Tips: Please keep the boxes that topical medications come in in order to help keep track of the instructions about where and how to use these. Pharmacies typically print the medication instructions only on the boxes and not directly on the medication tubes.   If your medication is too expensive, please contact our office at [phone] option 4 or send [place] a message through [place].   We are unable to tell what your co-pay for medications will be in advance as this is different depending on your insurance coverage. However, we may be able to find a substitute medication at lower cost or fill out paperwork to get insurance to cover a needed medication.   If a prior authorization is required to  get your medication covered by your insurance company, please allow [place] 1-2 business days to complete this process.  Drug prices often vary depending on where the prescription is filled and some pharmacies may offer cheaper prices.  The website www.goodrx.com contains coupons for medications through different pharmacies. The prices here do not account for what the cost may be with help from insurance (it may be cheaper with your insurance), but the website can give you the price if you did not use any insurance.  - You can print the associated coupon and take it with your prescription to the pharmacy.  - You may also stop by our office during regular business hours and pick up a GoodRx coupon card.  - If you need your prescription sent electronically to a different pharmacy, notify our office through [facility] or by phone at [phone] option 4.  Si Usted Necesita Algo Despus de Su Visita  Tambin puede enviarnos un mensaje a travs de Pharmacist, community. Por lo general respondemos a los mensajes de MyChart en el transcurso de 1 a 2 das hbiles.  Para renovar recetas, por favor pida a su farmacia que se ponga en contacto con nuestra oficina. Harland Dingwall de fax es [place] [phone].  Si tiene un asunto urgente cuando la clnica est cerrada y que no puede esperar hasta el siguiente da hbil, puede llamar/localizar a su doctor(a) al nmero que aparece a continuacin.   Por favor, tenga en cuenta que aunque hacemos todo lo posible para estar disponibles para asuntos urgentes fuera del horario de [place], no estamos disponibles las 24 horas del da, los 7 das de la [place].   Si tiene un problema urgente y no puede comunicarse con nosotros, puede optar por buscar atencin mdica  en el consultorio de su doctor(a), en una clnica privada, en un centro de atencin urgente o en una sala de emergencias.  Si tiene Engineering geologist, por favor llame inmediatamente al 911 o vaya a la sala de  emergencias.  Nmeros de bper  - Dr. Nehemiah Massed: [phone]  - Dra. Moye: [phone]  - Dra. Nicole Kindred: [phone]  En caso de inclemencias del [place], por favor llame a Johnsie Kindred principal al [phone] para una actualizacin sobre el [place] de cualquier retraso o cierre.  Consejos para la medicacin en dermatologa: Por favor, guarde las cajas en las que vienen los medicamentos de uso tpico para ayudarle a seguir las instrucciones sobre dnde y cmo usarlos. Las farmacias generalmente imprimen las instrucciones del medicamento slo en las cajas y no directamente en los tubos del [place].   Si su medicamento es muy caro, por favor, pngase en contacto con Zigmund Daniel llamando al [phone] y presione la opcin 4 o envenos un mensaje a travs de Pharmacist, community.   No podemos decirle cul ser su copago por los medicamentos por adelantado ya que esto es diferente dependiendo de la cobertura de su seguro. Sin embargo, es posible que podamos encontrar un medicamento sustituto a Electrical engineer un formulario para que el seguro cubra el medicamento que se considera necesario.   Si se requiere una autorizacin previa para que su compaa de seguros [place] su medicamento, por favor permtanos de 1 a 2 das hbiles para completar este proceso.  Los precios de los medicamentos varan con frecuencia dependiendo del Environmental consultant de dnde se surte la receta y alguna farmacias pueden ofrecer precios ms baratos.  El sitio web www.goodrx.com tiene cupones para medicamentos de Airline pilot. Los precios aqu no tienen en cuenta lo que podra costar con la ayuda del seguro (puede ser ms barato con su seguro), pero el sitio web puede darle el precio si no utiliz Research scientist (physical sciences).  - Puede imprimir el cupn correspondiente y llevarlo con su receta a la farmacia.  - Tambin puede pasar por nuestra oficina durante el horario de atencin regular y Charity fundraiser una tarjeta de cupones de GoodRx.  - Si  necesita que su receta se enve electrnicamente a una farmacia diferente, informe a nuestra oficina a travs de MyChart de  o por telfono llamando al [phone] y presione la opcin 4.

## 2021-01-20 NOTE — Telephone Encounter (Signed)
Pt requested Continuous Fmla through 07/23/21. Forms completed and papers signed by Dr. [name].  Sent patient mychart msg to inform pt that forms were completed.

## 2021-01-20 NOTE — Progress Notes (Signed)
Follow-Up Visit   Subjective  Amanda Skinner is a 60 y.o. female who presents for the following: Follow-up (Patient here today for full body exam. Patient reports a dry spot that comes and goes on nose. Patient also reports a spot at right toe that has been itchy, she would like checked. ).  She is currently getting chemotherapy for breast cancer that has spread to lymph nodes.  Patient here for full body skin exam and skin cancer screening.   The following portions of the chart were reviewed this encounter and updated as appropriate:      Review of Systems: No other skin or systemic complaints except as noted in HPI or Assessment and Plan.   Objective  Well appearing patient in no apparent distress; mood and affect are within normal limits.  A full examination was performed including scalp, head, eyes, ears, nose, lips, neck, chest, axillae, abdomen, back, buttocks, bilateral upper extremities, bilateral lower extremities, hands, feet, fingers, toes, fingernails, and toenails. All findings within normal limits unless otherwise noted below.  right anterior thigh 2.0 mm speckled brown macule   right spinal lower back 2.0 mm med dark brown macule   Right 5th Plantar Toe 0.6 cm flesh papule, gray/brown center, pt states has been itching some   left nasal dorsum x 1, right nasal tip x 1 (2) Pink scaly macules  Assessment & Plan  Nevus (3) right anterior thigh; right spinal lower back; Right 5th Plantar Toe  Benign-appearing.  Stable. Observation.  Call clinic for new or changing lesions.  Recommend daily use of broad spectrum spf 30+ sunscreen to sun-exposed areas.    Actinic keratosis (2) left nasal dorsum x 1, right nasal tip x 1  Actinic keratoses are precancerous spots that appear secondary to cumulative UV radiation exposure/sun exposure over time. They are chronic with expected duration over 1 year. A portion of actinic keratoses will progress to squamous cell carcinoma of  the skin. It is not possible to reliably predict which spots will progress to skin cancer and so treatment is recommended to prevent development of skin cancer.  Recommend daily broad spectrum sunscreen SPF 30+ to sun-exposed areas, reapply every 2 hours as needed.  Recommend staying in the shade or wearing long sleeves, sun glasses (UVA+UVB protection) and wide brim hats (4-inch brim around the entire circumference of the hat). Call for new or changing lesions.  Destruction of lesion - left nasal dorsum x 1, right nasal tip x 1  Destruction method: cryotherapy   Informed consent: discussed and consent obtained   Lesion destroyed using liquid nitrogen: Yes   Region frozen until ice ball extended beyond lesion: Yes   Outcome: patient tolerated procedure well with no complications   Post-procedure details: wound care instructions given   Additional details:  Prior to procedure, discussed risks of blister formation, small wound, skin dyspigmentation, or rare scar following cryotherapy. Recommend Vaseline ointment to treated areas while healing.  Lentigines - Scattered tan macules - Due to sun exposure - Benign-appearing, observe - Recommend daily broad spectrum sunscreen SPF 30+ to sun-exposed areas, reapply every 2 hours as needed. - Call for any changes  Seborrheic Keratoses - Stuck-on, waxy, tan-brown papules and/or plaques at left calf  - Benign-appearing - Discussed benign etiology and prognosis. - Observe - Call for any changes  Melanocytic Nevi - Tan-brown and/or pink-flesh-colored symmetric macules and papules - Benign appearing on exam today - Observation - Call clinic for new or changing moles - Recommend daily  use of broad spectrum spf 30+ sunscreen to sun-exposed areas.   Dermatofibroma - scar left lateral calf, bx-proven dermatofibroma - Benign appearing - Call for any changes  Xerosis - diffuse xerotic patches - recommend gentle, hydrating skin care - gentle  skin care handout given  Hemangiomas - Red papules - Discussed benign nature - Observe - Call for any changes  Actinic Damage - Chronic condition, secondary to cumulative UV/sun exposure - diffuse scaly erythematous macules with underlying dyspigmentation - Recommend daily broad spectrum sunscreen SPF 30+ to sun-exposed areas, reapply every 2 hours as needed.  - Staying in the shade or wearing long sleeves, sun glasses (UVA+UVB protection) and wide brim hats (4-inch brim around the entire circumference of the hat) are also recommended for sun protection.  - Call for new or changing lesions.  Skin cancer screening performed today.  Return in about 1 year (around 01/20/2022), or if symptoms worsen or fail to improve, for 1 year tbse . I, Ruthell Rummage, CMA, am acting as scribe for Brendolyn Patty, MD.  Documentation: I have reviewed the above documentation for accuracy and completeness, and I agree with the above.  Brendolyn Patty MD

## 2021-01-20 NOTE — Telephone Encounter (Signed)
01/20/21 - 10 am - Nira Conn, RN received fmla to process from clinical team. Will notify pt when ready/faxed

## 2021-01-21 ENCOUNTER — Other Ambulatory Visit: Payer: Self-pay

## 2021-01-24 ENCOUNTER — Inpatient Hospital Stay: Payer: 59

## 2021-01-24 ENCOUNTER — Encounter: Payer: Self-pay | Admitting: Oncology

## 2021-01-24 ENCOUNTER — Other Ambulatory Visit: Payer: Self-pay | Admitting: *Deleted

## 2021-01-24 ENCOUNTER — Inpatient Hospital Stay: Payer: 59 | Attending: Oncology

## 2021-01-24 ENCOUNTER — Other Ambulatory Visit: Payer: Self-pay

## 2021-01-24 ENCOUNTER — Inpatient Hospital Stay (HOSPITAL_BASED_OUTPATIENT_CLINIC_OR_DEPARTMENT_OTHER): Payer: 59 | Admitting: Oncology

## 2021-01-24 VITALS — BP 106/58 | HR 92 | Temp 98.3°F | Resp 16 | Wt 136.0 lb

## 2021-01-24 DIAGNOSIS — D701 Agranulocytosis secondary to cancer chemotherapy: Secondary | ICD-10-CM | POA: Diagnosis not present

## 2021-01-24 DIAGNOSIS — Z79899 Other long term (current) drug therapy: Secondary | ICD-10-CM | POA: Diagnosis not present

## 2021-01-24 DIAGNOSIS — Z5111 Encounter for antineoplastic chemotherapy: Secondary | ICD-10-CM | POA: Diagnosis not present

## 2021-01-24 DIAGNOSIS — C50411 Malignant neoplasm of upper-outer quadrant of right female breast: Secondary | ICD-10-CM

## 2021-01-24 DIAGNOSIS — D709 Neutropenia, unspecified: Secondary | ICD-10-CM | POA: Diagnosis not present

## 2021-01-24 DIAGNOSIS — Z170 Estrogen receptor positive status [ER+]: Secondary | ICD-10-CM

## 2021-01-24 DIAGNOSIS — T451X5A Adverse effect of antineoplastic and immunosuppressive drugs, initial encounter: Secondary | ICD-10-CM

## 2021-01-24 DIAGNOSIS — Z5189 Encounter for other specified aftercare: Secondary | ICD-10-CM | POA: Diagnosis not present

## 2021-01-24 LAB — CBC WITH DIFFERENTIAL/PLATELET
Abs Immature Granulocytes: 0 10*3/uL (ref 0.00–0.07)
Basophils Absolute: 0 10*3/uL (ref 0.0–0.1)
Basophils Relative: 1 %
Eosinophils Absolute: 0 10*3/uL (ref 0.0–0.5)
Eosinophils Relative: 2 %
HCT: 28.5 % — ABNORMAL LOW (ref 36.0–46.0)
Hemoglobin: 9.4 g/dL — ABNORMAL LOW (ref 12.0–15.0)
Immature Granulocytes: 0 %
Lymphocytes Relative: 36 %
Lymphs Abs: 0.8 10*3/uL (ref 0.7–4.0)
MCH: 33.1 pg (ref 26.0–34.0)
MCHC: 33 g/dL (ref 30.0–36.0)
MCV: 100.4 fL — ABNORMAL HIGH (ref 80.0–100.0)
Monocytes Absolute: 0.4 10*3/uL (ref 0.1–1.0)
Monocytes Relative: 21 %
Neutro Abs: 0.9 10*3/uL — ABNORMAL LOW (ref 1.7–7.7)
Neutrophils Relative %: 40 %
Platelets: 164 10*3/uL (ref 150–400)
RBC: 2.84 MIL/uL — ABNORMAL LOW (ref 3.87–5.11)
RDW: 17.9 % — ABNORMAL HIGH (ref 11.5–15.5)
WBC: 2.1 10*3/uL — ABNORMAL LOW (ref 4.0–10.5)
nRBC: 0 % (ref 0.0–0.2)

## 2021-01-24 LAB — COMPREHENSIVE METABOLIC PANEL
ALT: 61 U/L — ABNORMAL HIGH (ref 0–44)
AST: 39 U/L (ref 15–41)
Albumin: 3.9 g/dL (ref 3.5–5.0)
Alkaline Phosphatase: 85 U/L (ref 38–126)
Anion gap: 7 (ref 5–15)
BUN: 11 mg/dL (ref 8–23)
CO2: 26 mmol/L (ref 22–32)
Calcium: 8.9 mg/dL (ref 8.9–10.3)
Chloride: 104 mmol/L (ref 98–111)
Creatinine, Ser: 0.66 mg/dL (ref 0.44–1.00)
GFR, Estimated: >60 mL/min (ref >60)
Glucose, Bld: 117 mg/dL — ABNORMAL HIGH (ref 70–99)
Potassium: 3.5 mmol/L (ref 3.5–5.1)
Sodium: 137 mmol/L (ref 135–145)
Total Bilirubin: 0.5 mg/dL (ref 0.3–1.2)
Total Protein: 6.5 g/dL (ref 6.5–8.1)

## 2021-01-24 MED ORDER — HEPARIN SOD (PORK) LOCK FLUSH 100 UNIT/ML IV SOLN
500.0000 [IU] | Freq: Once | INTRAVENOUS | Status: AC
  Filled 2021-01-24: qty 5

## 2021-01-24 MED ORDER — SODIUM CHLORIDE 0.9% FLUSH
10.0000 mL | INTRAVENOUS | Status: AC | PRN
  Administered 2021-01-24: 10 mL via INTRAVENOUS
  Filled 2021-01-24: qty 10

## 2021-01-24 MED ORDER — FILGRASTIM-SNDZ 300 MCG/0.5ML IJ SOSY
300.0000 ug | PREFILLED_SYRINGE | Freq: Once | INTRAMUSCULAR | Status: AC
  Administered 2021-01-24: 300 ug via SUBCUTANEOUS

## 2021-01-24 NOTE — Progress Notes (Signed)
Zarxio injection for when she needs it

## 2021-01-24 NOTE — Progress Notes (Signed)
Pt will like to discuss radiation if it will be before or after surgery and will she have to see him continuously. Also, will like to discuss if she is able to go out of town for a couple of days in January with husband, but will like to know if it interferes with scans or txs?
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
## 2021-01-27 ENCOUNTER — Other Ambulatory Visit: Payer: 59

## 2021-01-27 ENCOUNTER — Inpatient Hospital Stay: Payer: 59

## 2021-01-27 ENCOUNTER — Ambulatory Visit: Payer: 59

## 2021-01-27 ENCOUNTER — Encounter: Payer: Self-pay | Admitting: Oncology

## 2021-01-27 ENCOUNTER — Other Ambulatory Visit: Payer: Self-pay | Admitting: Oncology

## 2021-01-27 ENCOUNTER — Other Ambulatory Visit: Payer: Self-pay

## 2021-01-27 ENCOUNTER — Ambulatory Visit: Payer: 59 | Admitting: Oncology

## 2021-01-27 DIAGNOSIS — D709 Neutropenia, unspecified: Secondary | ICD-10-CM | POA: Diagnosis not present

## 2021-01-27 DIAGNOSIS — N644 Mastodynia: Secondary | ICD-10-CM

## 2021-01-27 DIAGNOSIS — Z79899 Other long term (current) drug therapy: Secondary | ICD-10-CM | POA: Diagnosis not present

## 2021-01-27 DIAGNOSIS — Z5111 Encounter for antineoplastic chemotherapy: Secondary | ICD-10-CM | POA: Diagnosis not present

## 2021-01-27 DIAGNOSIS — C50411 Malignant neoplasm of upper-outer quadrant of right female breast: Secondary | ICD-10-CM | POA: Diagnosis not present

## 2021-01-27 DIAGNOSIS — Z5189 Encounter for other specified aftercare: Secondary | ICD-10-CM | POA: Diagnosis not present

## 2021-01-27 MED ORDER — FILGRASTIM-SNDZ 480 MCG/0.8ML IJ SOSY
480.0000 ug | PREFILLED_SYRINGE | Freq: Every day | INTRAMUSCULAR | Status: DC
  Administered 2021-01-27: 480 ug via SUBCUTANEOUS
  Filled 2021-01-27: qty 0.8

## 2021-01-27 NOTE — Progress Notes (Signed)
Give zarxio 480 today per MD

## 2021-01-28 ENCOUNTER — Inpatient Hospital Stay: Payer: 59

## 2021-01-28 ENCOUNTER — Ambulatory Visit
Admission: RE | Admit: 2021-01-28 | Discharge: 2021-01-28 | Disposition: A | Discharge status: Home / Self Care | Payer: 59 | Source: Ambulatory Visit | Attending: Oncology | Admitting: Oncology

## 2021-01-28 ENCOUNTER — Encounter: Payer: Self-pay | Admitting: Oncology

## 2021-01-28 DIAGNOSIS — N6489 Other specified disorders of breast: Secondary | ICD-10-CM | POA: Diagnosis not present

## 2021-01-28 DIAGNOSIS — Z170 Estrogen receptor positive status [ER+]: Secondary | ICD-10-CM | POA: Insufficient documentation

## 2021-01-28 DIAGNOSIS — Z5111 Encounter for antineoplastic chemotherapy: Secondary | ICD-10-CM | POA: Diagnosis not present

## 2021-01-28 DIAGNOSIS — C50411 Malignant neoplasm of upper-outer quadrant of right female breast: Secondary | ICD-10-CM

## 2021-01-28 DIAGNOSIS — D709 Neutropenia, unspecified: Secondary | ICD-10-CM | POA: Diagnosis not present

## 2021-01-28 DIAGNOSIS — Z79899 Other long term (current) drug therapy: Secondary | ICD-10-CM | POA: Diagnosis not present

## 2021-01-28 DIAGNOSIS — Z5189 Encounter for other specified aftercare: Secondary | ICD-10-CM | POA: Diagnosis not present

## 2021-01-28 IMAGING — US US BREAST*R* LIMITED INC AXILLA
1 series · 13 of 14 positions shown · non-contrast
Comparison: RIGHT breast and axillary ultrasound [DATE]. MRI
breast [DATE].

CLINICAL DATA: 60-year-old with a biopsy-proven invasive mammary
carcinoma of no special type and metastatic disease to RIGHT
axillary lymph nodes. Evaluate response to neoadjuvant chemotherapy.

EXAM:
ULTRASOUND OF THE RIGHT BREAST AND AXILLA

[Series 1: us breast*right* limited inc axilla · 0.06mm/px · 13 of 14 slices shown]
[im 1/14]
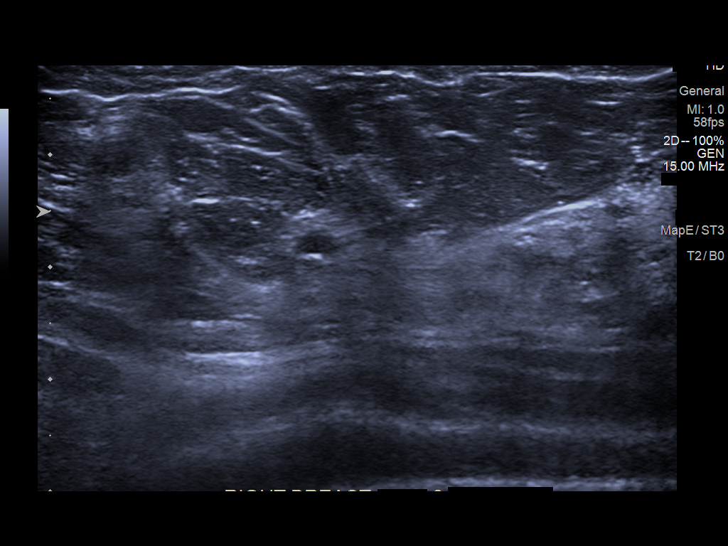
[im 2/14]
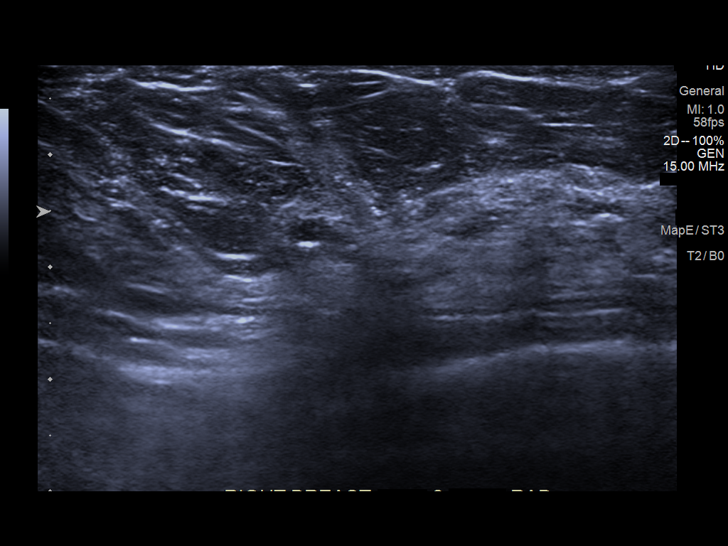
[im 3/14]
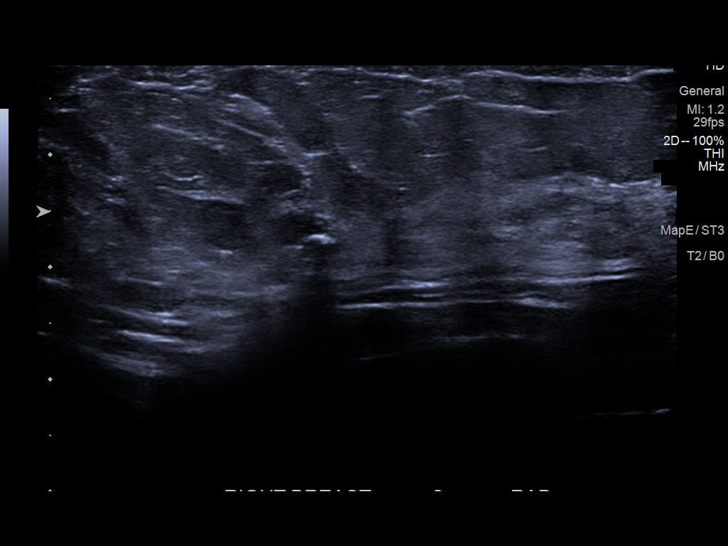
[im 4/14]
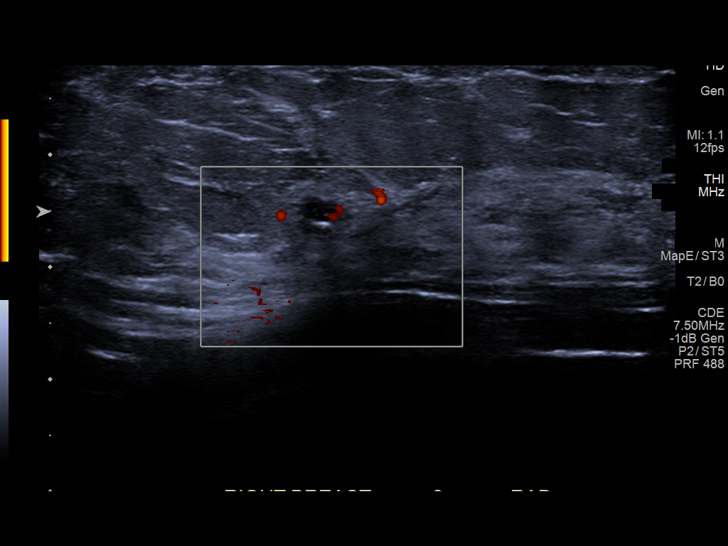
[im 5/14]
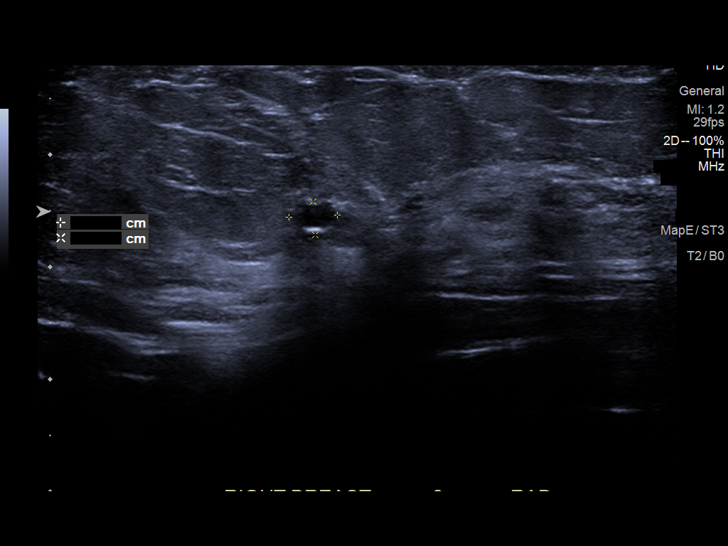
[im 6/14]
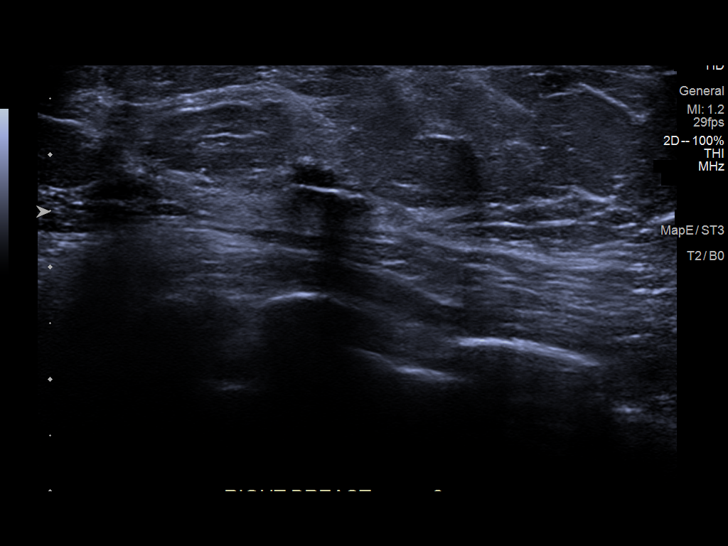
[im 8/14]
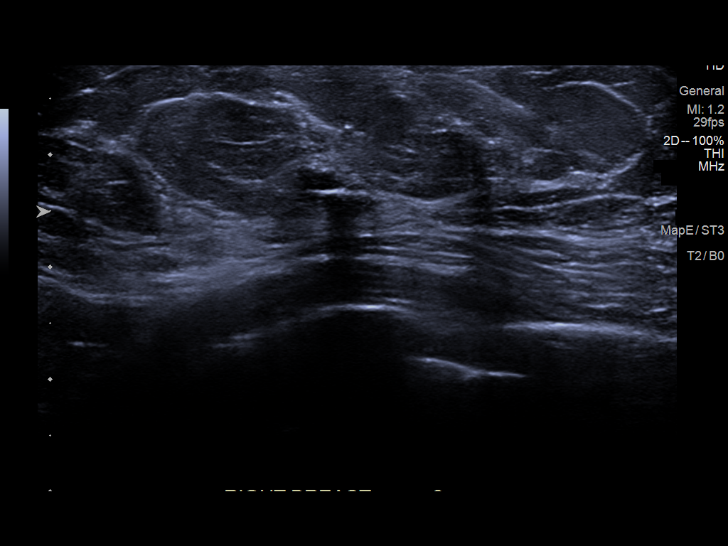
[im 9/14]
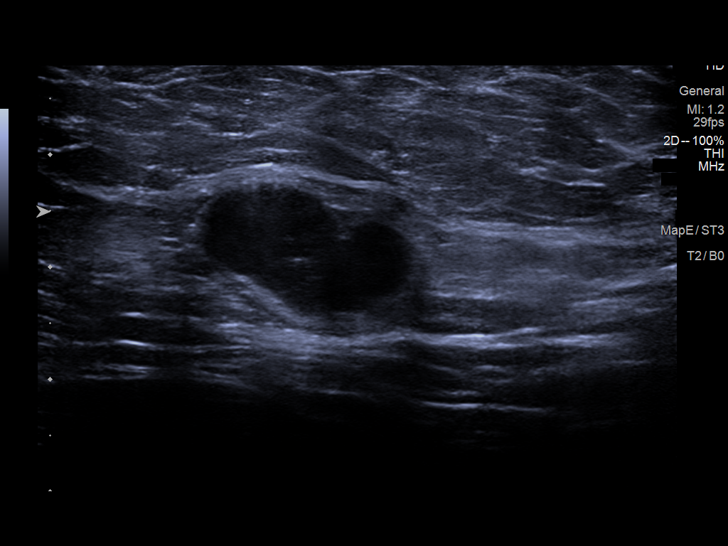
[im 10/14]
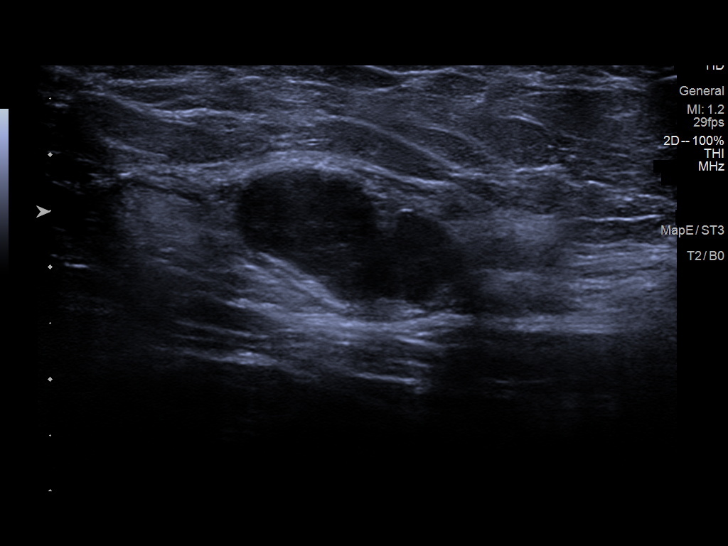
[im 11/14]
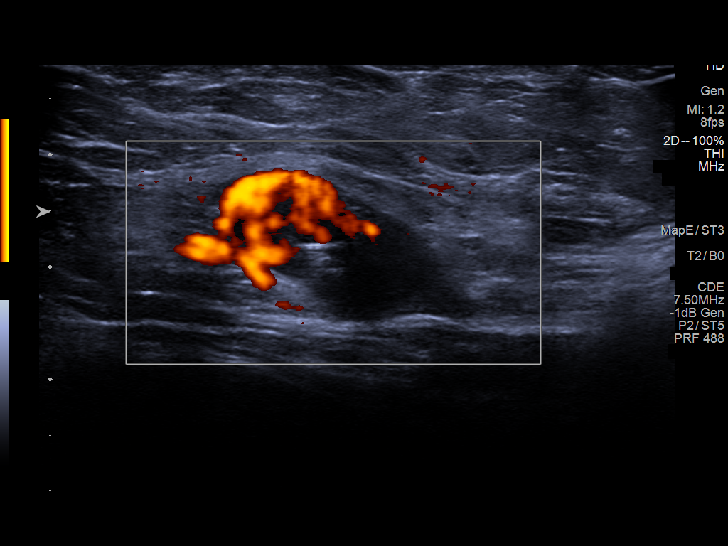
[im 12/14]
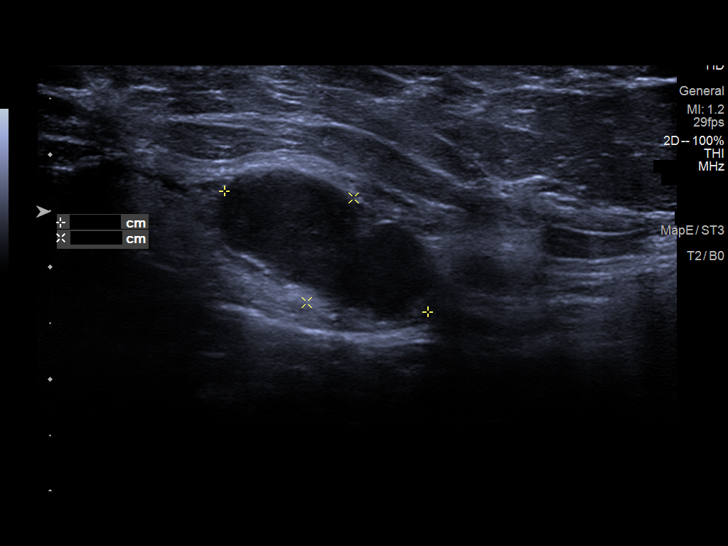
[im 13/14]
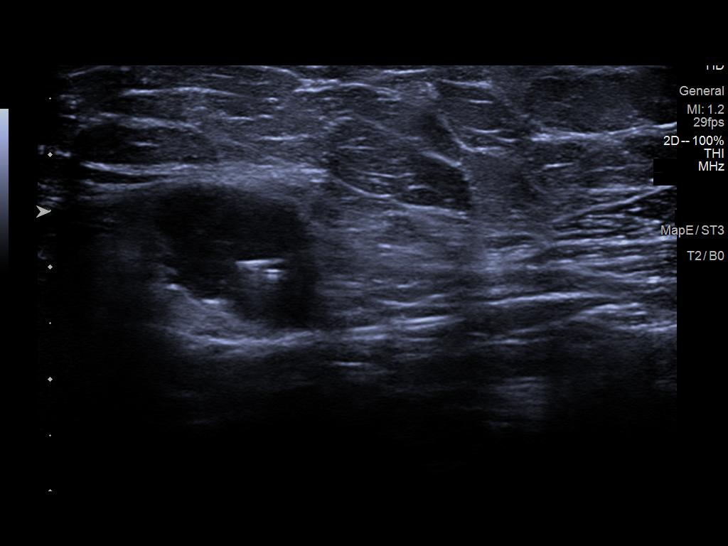
[im 14/14]
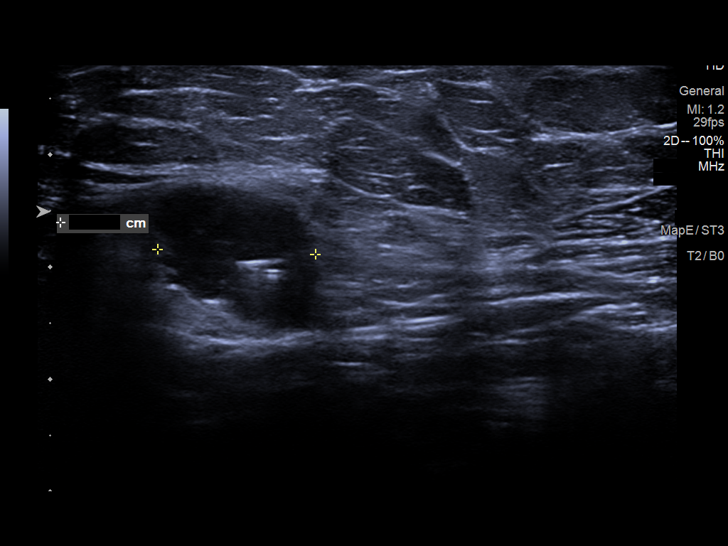

[13 of 14 positions shown; findings below may reference images not displayed]

FINDINGS: Targeted ultrasound is performed, showing that the biopsy-proven
invasive mammary carcinoma at the 10:30 o'clock position 8 cm from
the nipple has significantly decreased in size since the [DATE]
ultrasound, currently measuring approximately 0.4 x 0.3 x 0.5 cm
(previously 1.2 x 0.9 x 1.2 cm). The hyperechoic tissue marker clip
is present within the small residual mass.

The largest RIGHT axillary lymph node which was previously biopsied
has also significantly decreased in size in the interval, currently
measuring approximately 2.1 x 1.0 x 1.4 cm (previously 3.1 x 1.6 x
2.4 cm). The hyperechoic tissue marker clip is present within the
lymph node.
IMPRESSION: Very good response to therapy with significant interval decrease in
size of the biopsy-proven invasive mammary carcinoma in the UPPER
OUTER QUADRANT of the RIGHT breast and significant interval decrease
in size of the biopsy-proven metastatic RIGHT axillary lymph node.

RECOMMENDATION:
1. Treatment plan.
2. The patient will be due for annual mammography in [REDACTED] or
early [DATE].

I have discussed the findings and recommendations with the patient.
If applicable, a reminder letter will be sent to the patient
regarding the next appointment.

BI-RADS CATEGORY  6: Known biopsy-proven malignancy.

## 2021-01-28 MED ORDER — FILGRASTIM-SNDZ 480 MCG/0.8ML IJ SOSY
480.0000 ug | PREFILLED_SYRINGE | Freq: Every day | INTRAMUSCULAR | Status: DC
  Administered 2021-01-28: 480 ug via SUBCUTANEOUS
  Filled 2021-01-28: qty 0.8

## 2021-01-29 ENCOUNTER — Other Ambulatory Visit: Payer: Self-pay

## 2021-01-29 DIAGNOSIS — C50411 Malignant neoplasm of upper-outer quadrant of right female breast: Secondary | ICD-10-CM

## 2021-01-29 DIAGNOSIS — Z170 Estrogen receptor positive status [ER+]: Secondary | ICD-10-CM

## 2021-01-31 ENCOUNTER — Other Ambulatory Visit: Payer: Self-pay | Admitting: *Deleted

## 2021-01-31 ENCOUNTER — Encounter: Payer: Self-pay | Admitting: Oncology

## 2021-01-31 ENCOUNTER — Other Ambulatory Visit: Payer: Self-pay

## 2021-01-31 ENCOUNTER — Inpatient Hospital Stay: Payer: 59

## 2021-01-31 VITALS — BP 124/71 | HR 84 | Temp 98.3°F | Resp 17 | Wt 136.4 lb

## 2021-01-31 DIAGNOSIS — C50411 Malignant neoplasm of upper-outer quadrant of right female breast: Secondary | ICD-10-CM | POA: Diagnosis not present

## 2021-01-31 DIAGNOSIS — D709 Neutropenia, unspecified: Secondary | ICD-10-CM | POA: Diagnosis not present

## 2021-01-31 DIAGNOSIS — Z5189 Encounter for other specified aftercare: Secondary | ICD-10-CM | POA: Diagnosis not present

## 2021-01-31 DIAGNOSIS — Z79899 Other long term (current) drug therapy: Secondary | ICD-10-CM | POA: Diagnosis not present

## 2021-01-31 DIAGNOSIS — Z5111 Encounter for antineoplastic chemotherapy: Secondary | ICD-10-CM | POA: Diagnosis not present

## 2021-01-31 DIAGNOSIS — Z170 Estrogen receptor positive status [ER+]: Secondary | ICD-10-CM

## 2021-01-31 LAB — COMPREHENSIVE METABOLIC PANEL
ALT: 64 U/L — ABNORMAL HIGH (ref 0–44)
AST: 39 U/L (ref 15–41)
Albumin: 3.9 g/dL (ref 3.5–5.0)
Alkaline Phosphatase: 150 U/L — ABNORMAL HIGH (ref 38–126)
Anion gap: 9 (ref 5–15)
BUN: 19 mg/dL (ref 8–23)
CO2: 26 mmol/L (ref 22–32)
Calcium: 8.9 mg/dL (ref 8.9–10.3)
Chloride: 101 mmol/L (ref 98–111)
Creatinine, Ser: 0.74 mg/dL (ref 0.44–1.00)
GFR, Estimated: >60 mL/min (ref >60)
Glucose, Bld: 127 mg/dL — ABNORMAL HIGH (ref 70–99)
Potassium: 3.5 mmol/L (ref 3.5–5.1)
Sodium: 136 mmol/L (ref 135–145)
Total Bilirubin: 0.5 mg/dL (ref 0.3–1.2)
Total Protein: 6.7 g/dL (ref 6.5–8.1)

## 2021-01-31 LAB — CBC WITH DIFFERENTIAL/PLATELET
Abs Immature Granulocytes: 0.76 10*3/uL — ABNORMAL HIGH (ref 0.00–0.07)
Basophils Absolute: 0.1 10*3/uL (ref 0.0–0.1)
Basophils Relative: 1 %
Eosinophils Absolute: 0.1 10*3/uL (ref 0.0–0.5)
Eosinophils Relative: 1 %
HCT: 33.1 % — ABNORMAL LOW (ref 36.0–46.0)
Hemoglobin: 10.8 g/dL — ABNORMAL LOW (ref 12.0–15.0)
Immature Granulocytes: 7 %
Lymphocytes Relative: 14 %
Lymphs Abs: 1.5 10*3/uL (ref 0.7–4.0)
MCH: 33 pg (ref 26.0–34.0)
MCHC: 32.6 g/dL (ref 30.0–36.0)
MCV: 101.2 fL — ABNORMAL HIGH (ref 80.0–100.0)
Monocytes Absolute: 0.7 10*3/uL (ref 0.1–1.0)
Monocytes Relative: 7 %
Neutro Abs: 7.7 10*3/uL (ref 1.7–7.7)
Neutrophils Relative %: 70 %
Platelets: 200 10*3/uL (ref 150–400)
RBC: 3.27 MIL/uL — ABNORMAL LOW (ref 3.87–5.11)
RDW: 16.4 % — ABNORMAL HIGH (ref 11.5–15.5)
Smear Review: NORMAL
WBC: 10.8 10*3/uL — ABNORMAL HIGH (ref 4.0–10.5)
nRBC: 0 % (ref 0.0–0.2)

## 2021-01-31 MED ORDER — HEPARIN SOD (PORK) LOCK FLUSH 100 UNIT/ML IV SOLN
INTRAVENOUS | Status: AC
  Administered 2021-01-31: 500 [IU]
  Filled 2021-01-31: qty 5

## 2021-01-31 MED ORDER — SODIUM CHLORIDE 0.9 % IV SOLN
10.0000 mg | Freq: Once | INTRAVENOUS | Status: AC
  Administered 2021-01-31: 10 mg via INTRAVENOUS
  Filled 2021-01-31: qty 10

## 2021-01-31 MED ORDER — HEPARIN SOD (PORK) LOCK FLUSH 100 UNIT/ML IV SOLN
500.0000 [IU] | Freq: Once | INTRAVENOUS | Status: AC | PRN
  Filled 2021-01-31: qty 5

## 2021-01-31 MED ORDER — DIPHENHYDRAMINE HCL 50 MG/ML IJ SOLN
12.5000 mg | Freq: Once | INTRAMUSCULAR | Status: AC
  Administered 2021-01-31: 12.5 mg via INTRAVENOUS
  Filled 2021-01-31: qty 1

## 2021-01-31 MED ORDER — SODIUM CHLORIDE 0.9 % IV SOLN
65.0000 mg/m2 | Freq: Once | INTRAVENOUS | Status: AC
  Administered 2021-01-31: 102 mg via INTRAVENOUS
  Filled 2021-01-31: qty 17

## 2021-01-31 MED ORDER — SODIUM CHLORIDE 0.9 % IV SOLN
Freq: Once | INTRAVENOUS | Status: AC
  Filled 2021-01-31: qty 250

## 2021-01-31 MED ORDER — FAMOTIDINE 20 MG IN NS 100 ML IVPB
20.0000 mg | Freq: Once | INTRAVENOUS | Status: AC
  Administered 2021-01-31: 20 mg via INTRAVENOUS
  Filled 2021-01-31: qty 20

## 2021-01-31 NOTE — Patient Instructions (Signed)
[facility] AT [place]  Discharge Instructions: Thank you for choosing [facility] to provide your oncology and hematology care.  If you have a lab appointment with the [facility], please go directly to the [facility] and check in at the registration area.  Wear comfortable clothing and clothing appropriate for easy access to any Portacath or PICC line.   We strive to give you quality time with your provider. You may need to reschedule your appointment if you arrive late (15 or more minutes).  Arriving late affects you and other patients whose appointments are after yours.  Also, if you miss three or more appointments without notifying the office, you may be dismissed from the clinic at the provider's discretion.      For prescription refill requests, have your pharmacy contact our office and allow 72 hours for refills to be completed.    Today you received the following chemotherapy and/or immunotherapy agents Taxol      To help prevent nausea and vomiting after your treatment, we encourage you to take your nausea medication as directed.  BELOW ARE SYMPTOMS THAT SHOULD BE REPORTED IMMEDIATELY: *FEVER GREATER THAN 100.4 F (38 C) OR HIGHER *CHILLS OR SWEATING *NAUSEA AND VOMITING THAT IS NOT CONTROLLED WITH YOUR NAUSEA MEDICATION *UNUSUAL SHORTNESS OF BREATH *UNUSUAL BRUISING OR BLEEDING *URINARY PROBLEMS (pain or burning when urinating, or frequent urination) *BOWEL PROBLEMS (unusual diarrhea, constipation, pain near the anus) TENDERNESS IN MOUTH AND THROAT WITH OR WITHOUT PRESENCE OF ULCERS (sore throat, sores in mouth, or a toothache) UNUSUAL RASH, SWELLING OR PAIN  UNUSUAL VAGINAL DISCHARGE OR ITCHING   Items with * indicate a potential emergency and should be followed up as soon as possible or go to the Emergency Department if any problems should occur.  Please show the CHEMOTHERAPY ALERT CARD or IMMUNOTHERAPY ALERT CARD at check-in to the  Emergency Department and triage nurse.  Should you have questions after your visit or need to cancel or reschedule your appointment, please contact [facility]CER [place] AT [place]  [phone] and follow the prompts.  Office hours are 8:00 a.m. to 4:30 p.m. Monday - Friday. Please note that voicemails left after 4:00 p.m. may not be returned until the following business day.  We are closed weekends and major holidays. You have access to a nurse at all times for urgent questions. Please call the main number to the clinic [phone] and follow the prompts.  For any non-urgent questions, you may also contact your provider using MyChart. We now offer e-Visits for anyone 92 and older to request care online for non-urgent symptoms. For details visit [email or web address].   Also download the MyChart app! Go to the app store, search "MyChart", open the app, select [facility], and log in with your MyChart username and password.  Due to Covid, a mask is required upon entering the hospital/clinic. If you do not have a mask, one will be given to you upon arrival. For doctor visits, patients may have 1 support person aged 31 or older with them. For treatment visits, patients cannot have anyone with them due to current Covid guidelines and our immunocompromised population.

## 2021-02-03 ENCOUNTER — Inpatient Hospital Stay: Payer: 59

## 2021-02-04 ENCOUNTER — Inpatient Hospital Stay: Payer: 59

## 2021-02-05 ENCOUNTER — Inpatient Hospital Stay: Payer: 59

## 2021-02-05 ENCOUNTER — Other Ambulatory Visit: Payer: Self-pay

## 2021-02-05 ENCOUNTER — Ambulatory Visit
Admission: RE | Admit: 2021-02-05 | Discharge: 2021-02-05 | Disposition: A | Discharge status: Home / Self Care | Payer: 59 | Source: Ambulatory Visit | Attending: Oncology | Admitting: Oncology

## 2021-02-05 DIAGNOSIS — Z01818 Encounter for other preprocedural examination: Secondary | ICD-10-CM | POA: Insufficient documentation

## 2021-02-05 DIAGNOSIS — Z0189 Encounter for other specified special examinations: Secondary | ICD-10-CM

## 2021-02-05 DIAGNOSIS — Z170 Estrogen receptor positive status [ER+]: Secondary | ICD-10-CM | POA: Diagnosis not present

## 2021-02-05 DIAGNOSIS — C50411 Malignant neoplasm of upper-outer quadrant of right female breast: Secondary | ICD-10-CM | POA: Diagnosis not present

## 2021-02-05 LAB — ECHOCARDIOGRAM COMPLETE
AR max vel: 2.06 cm2
AV Area VTI: 2.11 cm2
AV Area mean vel: 2.09 cm2
AV Mean grad: 6 mmHg
AV Peak grad: 10.9 mmHg
Ao pk vel: 1.65 m/s
Area-P 1/2: 3.11 cm2
MV VTI: 3.54 cm2
S' Lateral: 2.4 cm

## 2021-02-05 NOTE — Progress Notes (Signed)
*  PRELIMINARY RESULTS* Echocardiogram 2D Echocardiogram has been performed.  Amanda Skinner 02/05/2021, 9:44 AM

## 2021-02-07 ENCOUNTER — Encounter: Payer: Self-pay | Admitting: Oncology

## 2021-02-07 ENCOUNTER — Other Ambulatory Visit: Payer: Self-pay

## 2021-02-07 ENCOUNTER — Inpatient Hospital Stay: Payer: 59

## 2021-02-07 ENCOUNTER — Inpatient Hospital Stay (HOSPITAL_BASED_OUTPATIENT_CLINIC_OR_DEPARTMENT_OTHER): Payer: 59 | Admitting: Oncology

## 2021-02-07 VITALS — BP 113/65 | HR 85 | Temp 98.4°F | Resp 16 | Wt 137.2 lb

## 2021-02-07 DIAGNOSIS — C50411 Malignant neoplasm of upper-outer quadrant of right female breast: Secondary | ICD-10-CM | POA: Diagnosis not present

## 2021-02-07 DIAGNOSIS — Z5111 Encounter for antineoplastic chemotherapy: Secondary | ICD-10-CM | POA: Diagnosis not present

## 2021-02-07 DIAGNOSIS — Z5189 Encounter for other specified aftercare: Secondary | ICD-10-CM | POA: Diagnosis not present

## 2021-02-07 DIAGNOSIS — Z79899 Other long term (current) drug therapy: Secondary | ICD-10-CM | POA: Diagnosis not present

## 2021-02-07 DIAGNOSIS — Z170 Estrogen receptor positive status [ER+]: Secondary | ICD-10-CM

## 2021-02-07 DIAGNOSIS — D709 Neutropenia, unspecified: Secondary | ICD-10-CM | POA: Diagnosis not present

## 2021-02-07 DIAGNOSIS — N644 Mastodynia: Secondary | ICD-10-CM

## 2021-02-07 LAB — CBC WITH DIFFERENTIAL/PLATELET
Abs Immature Granulocytes: 0.02 10*3/uL (ref 0.00–0.07)
Basophils Absolute: 0 10*3/uL (ref 0.0–0.1)
Basophils Relative: 1 %
Eosinophils Absolute: 0.1 10*3/uL (ref 0.0–0.5)
Eosinophils Relative: 2 %
HCT: 31.8 % — ABNORMAL LOW (ref 36.0–46.0)
Hemoglobin: 10.6 g/dL — ABNORMAL LOW (ref 12.0–15.0)
Immature Granulocytes: 1 %
Lymphocytes Relative: 23 %
Lymphs Abs: 1 10*3/uL (ref 0.7–4.0)
MCH: 32.7 pg (ref 26.0–34.0)
MCHC: 33.3 g/dL (ref 30.0–36.0)
MCV: 98.1 fL (ref 80.0–100.0)
Monocytes Absolute: 0.3 10*3/uL (ref 0.1–1.0)
Monocytes Relative: 7 %
Neutro Abs: 2.9 10*3/uL (ref 1.7–7.7)
Neutrophils Relative %: 66 %
Platelets: 229 10*3/uL (ref 150–400)
RBC: 3.24 MIL/uL — ABNORMAL LOW (ref 3.87–5.11)
RDW: 14.7 % (ref 11.5–15.5)
WBC: 4.4 10*3/uL (ref 4.0–10.5)
nRBC: 0 % (ref 0.0–0.2)

## 2021-02-07 LAB — COMPREHENSIVE METABOLIC PANEL
ALT: 86 U/L — ABNORMAL HIGH (ref 0–44)
AST: 40 U/L (ref 15–41)
Albumin: 4 g/dL (ref 3.5–5.0)
Alkaline Phosphatase: 93 U/L (ref 38–126)
Anion gap: 7 (ref 5–15)
BUN: 11 mg/dL (ref 8–23)
CO2: 27 mmol/L (ref 22–32)
Calcium: 9.1 mg/dL (ref 8.9–10.3)
Chloride: 103 mmol/L (ref 98–111)
Creatinine, Ser: 0.61 mg/dL (ref 0.44–1.00)
GFR, Estimated: >60 mL/min (ref >60)
Glucose, Bld: 110 mg/dL — ABNORMAL HIGH (ref 70–99)
Potassium: 3.9 mmol/L (ref 3.5–5.1)
Sodium: 137 mmol/L (ref 135–145)
Total Bilirubin: 0.4 mg/dL (ref 0.3–1.2)
Total Protein: 6.8 g/dL (ref 6.5–8.1)

## 2021-02-07 MED ORDER — HEPARIN SOD (PORK) LOCK FLUSH 100 UNIT/ML IV SOLN
500.0000 [IU] | Freq: Once | INTRAVENOUS | Status: AC | PRN
  Filled 2021-02-07: qty 5

## 2021-02-07 MED ORDER — SODIUM CHLORIDE 0.9 % IV SOLN
Freq: Once | INTRAVENOUS | Status: AC
  Filled 2021-02-07: qty 250

## 2021-02-07 MED ORDER — HEPARIN SOD (PORK) LOCK FLUSH 100 UNIT/ML IV SOLN
INTRAVENOUS | Status: AC
  Administered 2021-02-07: 500 [IU]
  Filled 2021-02-07: qty 5

## 2021-02-07 MED ORDER — DIPHENHYDRAMINE HCL 50 MG/ML IJ SOLN
12.5000 mg | Freq: Once | INTRAMUSCULAR | Status: AC
  Administered 2021-02-07: 12.5 mg via INTRAVENOUS
  Filled 2021-02-07: qty 1

## 2021-02-07 MED ORDER — SODIUM CHLORIDE 0.9 % IV SOLN
10.0000 mg | Freq: Once | INTRAVENOUS | Status: AC
  Administered 2021-02-07: 10 mg via INTRAVENOUS
  Filled 2021-02-07: qty 10

## 2021-02-07 MED ORDER — FAMOTIDINE 20 MG IN NS 100 ML IVPB
20.0000 mg | Freq: Once | INTRAVENOUS | Status: AC
  Administered 2021-02-07: 20 mg via INTRAVENOUS
  Filled 2021-02-07: qty 20

## 2021-02-07 MED ORDER — SODIUM CHLORIDE 0.9 % IV SOLN
65.0000 mg/m2 | Freq: Once | INTRAVENOUS | Status: AC
  Administered 2021-02-07: 102 mg via INTRAVENOUS
  Filled 2021-02-07: qty 17

## 2021-02-07 NOTE — Progress Notes (Signed)
Patient here for pre treatment check she reports no changes since last treatment.

## 2021-02-07 NOTE — Progress Notes (Signed)
Pt received taxol infusion in clinic today. Tolerated well. No complaints at d/c.

## 2021-02-07 NOTE — Progress Notes (Signed)
ALT 86, Per [name] - ok to proceed with tx as scheduled today.
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
## 2021-02-07 NOTE — Patient Instructions (Signed)
[facility] [place]   Discharge Instructions: Thank you for choosing [facility] to provide your oncology and hematology care.  If you have a lab appointment with the [facility], please go directly to the [facility] and check in at the registration area.  Wear comfortable clothing and clothing appropriate for easy access to any Portacath or PICC line.   We strive to give you quality time with your provider. You may need to reschedule your appointment if you arrive late (15 or more minutes).  Arriving late affects you and other patients whose appointments are after yours.  Also, if you miss three or more appointments without notifying the office, you may be dismissed from the clinic at the providers discretion.      For prescription refill requests, have your pharmacy contact our office and allow 72 hours for refills to be completed.    Today you received the following chemotherapy and/or immunotherapy agents - Taxol      To help prevent nausea and vomiting after your treatment, we encourage you to take your nausea medication as directed.  BELOW ARE SYMPTOMS THAT SHOULD BE REPORTED IMMEDIATELY: *FEVER GREATER THAN 100.4 F (38 C) OR HIGHER *CHILLS OR SWEATING *NAUSEA AND VOMITING THAT IS NOT CONTROLLED WITH YOUR NAUSEA MEDICATION *UNUSUAL SHORTNESS OF BREATH *UNUSUAL BRUISING OR BLEEDING *URINARY PROBLEMS (pain or burning when urinating, or frequent urination) *BOWEL PROBLEMS (unusual diarrhea, constipation, pain near the anus) TENDERNESS IN MOUTH AND THROAT WITH OR WITHOUT PRESENCE OF ULCERS (sore throat, sores in mouth, or a toothache) UNUSUAL RASH, SWELLING OR PAIN  UNUSUAL VAGINAL DISCHARGE OR ITCHING   Items with * indicate a potential emergency and should be followed up as soon as possible or go to the Emergency Department if any problems should occur.  Please show the CHEMOTHERAPY ALERT CARD or IMMUNOTHERAPY ALERT CARD at check-in to  the Emergency Department and triage nurse.  Should you have questions after your visit or need to cancel or reschedule your appointment, please contact [facility] [place] [facility] [place]  [phone] and follow the prompts.  Office hours are 8:00 a.m. to 4:30 p.m. Monday - Friday. Please note that voicemails left after 4:00 p.m. may not be returned until the following business day.  We are closed weekends and major holidays. You have access to a nurse at all times for urgent questions. Please call the main number to the clinic [phone] and follow the prompts.  For any non-urgent questions, you may also contact your provider using MyChart. We now offer e-Visits for anyone 49 and older to request care online for non-urgent symptoms. For details visit [email or web address].   Also download the MyChart app! Go to the app store, search "MyChart", open the app, select [facility], and log in with your MyChart username and password.  Due to Covid, a mask is required upon entering the hospital/clinic. If you do not have a mask, one will be given to you upon arrival. For doctor visits, patients may have 1 support person aged 11 or older with them. For treatment visits, patients cannot have anyone with them due to current Covid guidelines and our immunocompromised population.   Diphenhydramine Injection What is this medication? DIPHENHYDRAMINE (dye fen HYE dra meen) treats the symptoms of allergic reactions. It may also be used to prevent and treat motion sickness or the symptoms of Parkinson disease. It works by blocking histamine, a substance released by the body during an allergic reaction. It belongs to  a group of medications called antihistamines. This medicine may be used for other purposes; ask your health care provider or pharmacist if you have questions. COMMON BRAND NAME(S): Benadryl What should I tell my care team before I take this medication? They need to know if you have any of  these conditions: Asthma or lung disease Glaucoma High blood pressure or heart disease Liver disease Pain or difficulty passing urine Prostate trouble Ulcers or other stomach problems An unusual or allergic reaction to diphenhydramine, antihistamines, other medications foods, dyes, or preservatives Pregnant or trying to get pregnant Breast-feeding How should I use this medication? This medication is for injection into a vein or a muscle. It is usually given in a hospital or clinic. If you get this medication at home, you will be taught how to prepare and give this medication. Use exactly as directed. Take your medication at regular intervals. Do not take your medication more often than directed. It is important that you put your used needles and syringes in a special sharps container. Do not put them in a trash can. If you do not have a sharps container, call your pharmacist or care team to get one. Talk to your care team regarding the use of this medication in children. While this medication may be prescribed for selected conditions, precautions do apply. This medication is not approved for use in newborns and premature babies. Patients over 22 years old may have a stronger reaction and need a smaller dose. Overdosage: If you think you have taken too much of this medicine contact a poison control center or emergency room at once. NOTE: This medicine is only for you. Do not share this medicine with others. What if I miss a dose? If you miss a dose, take it as soon as you can. If it is almost time for your next dose, take only that dose. Do not take double or extra doses. What may interact with this medication? Do not take this medication with any of the following: MAOIs like Carbex, Eldepryl, Marplan, Nardil, and Parnate This medication may also interact with the following: Alcohol Barbiturates, like phenobarbital Medications for bladder spasm like oxybutynin, tolterodine Medications for  blood pressure Medications for depression, anxiety, or psychotic disturbances Medications for movement abnormalities or Parkinson disease Medications for sleep Other medications for cold, cough or allergy Some medications for the stomach like chlordiazepoxide, dicyclomine This list may not describe all possible interactions. Give your health care provider a list of all the medicines, herbs, non-prescription drugs, or dietary supplements you use. Also tell them if you smoke, drink alcohol, or use illegal drugs. Some items may interact with your medicine. What should I watch for while using this medication? Your condition will be monitored carefully while you are receiving this medication. Tell your care team if your symptoms do not start to get better or if they get worse. You may get drowsy or dizzy. Do not drive, use machinery, or do anything that needs mental alertness until you know how this medication affects you. Do not stand or sit up quickly, especially if you are an older patient. This reduces the risk of dizzy or fainting spells. Alcohol may interfere with the effect of this medication. Avoid alcoholic drinks. Your mouth may get dry. Chewing sugarless gum or sucking hard candy, and drinking plenty of water may help. Contact your care team if the problem does not go away or is severe. What side effects may I notice from receiving this medication? Side effects  that you should report to your care team as soon as possible: Allergic reactions--skin rash, itching, hives, swelling of the face, lips, tongue, or throat Sudden eye pain or change in vision such as blurry vision, seeing halos around lights, vision loss Trouble passing urine Side effects that usually do not require medical attention (report to your care team if they continue or are bothersome): Constipation Drowsiness Dry mouth Headache Upset stomach This list may not describe all possible side effects. Call your doctor for medical  advice about side effects. You may report side effects to FDA at 1-800-FDA-1088. Where should I keep my medication? Keep out of the reach of children and pets. If you are using this medication at home, you will be instructed on how to store this medication. Throw away any unused medication after the expiration date on the label. NOTE: This sheet is a summary. It may not cover all possible information. If you have questions about this medicine, talk to your doctor, pharmacist, or health care provider.  2022 Elsevier/Gold Standard (2020-03-08 00:00:00)  Dexamethasone injection What is this medication? DEXAMETHASONE (dex a METH a sone) is a corticosteroid. It is used to treat inflammation of the skin, joints, lungs, and other organs. Common conditions treated include asthma, allergies, and arthritis. It is also used for other conditions, like blood disorders and diseases of the adrenal glands. This medicine may be used for other purposes; ask your health care provider or pharmacist if you have questions. COMMON BRAND NAME(S): Decadron, DoubleDex, ReadySharp Dexamethasone, Simplist Dexamethasone, Solurex What should I tell my care team before I take this medication? They need to know if you have any of these conditions: Cushing's syndrome diabetes glaucoma heart disease high blood pressure infection like herpes, measles, tuberculosis, or chickenpox kidney disease liver disease mental illness myasthenia gravis osteoporosis previous heart attack seizures stomach or intestine problems thyroid disease an unusual or allergic reaction to dexamethasone, corticosteroids, other medicines, lactose, foods, dyes, or preservatives pregnant or trying to get pregnant breast-feeding How should I use this medication? This medicine is for injection into a muscle, joint, lesion, soft tissue, or vein. It is given by a health care professional in a hospital or clinic setting. Talk to your pediatrician  regarding the use of this medicine in children. Special care may be needed. Overdosage: If you think you have taken too much of this medicine contact a poison control center or emergency room at once. NOTE: This medicine is only for you. Do not share this medicine with others. What if I miss a dose? This may not apply. If you are having a series of injections over a prolonged period, try not to miss an appointment. Call your doctor or health care professional to reschedule if you are unable to keep an appointment. What may interact with this medication? Do not take this medicine with any of the following medications: live virus vaccines This medicine may also interact with the following medications: aminoglutethimide amphotericin B aspirin and aspirin-like medicines certain antibiotics like erythromycin, clarithromycin, and troleandomycin certain antivirals for HIV or hepatitis certain medicines for seizures like carbamazepine, phenobarbital, phenytoin certain medicines to treat myasthenia gravis cholestyramine cyclosporine digoxin diuretics ephedrine female hormones, like estrogen or progestins and birth control pills insulin or other medicines for diabetes isoniazid ketoconazole medicines that relax muscles for surgery mifepristone NSAIDs, medicines for pain and inflammation, like ibuprofen or naproxen rifampin skin tests for allergies thalidomide vaccines warfarin This list may not describe all possible interactions. Give your health care provider  a list of all the medicines, herbs, non-prescription drugs, or dietary supplements you use. Also tell them if you smoke, drink alcohol, or use illegal drugs. Some items may interact with your medicine. What should I watch for while using this medication? Visit your health care professional for regular checks on your progress. Tell your health care professional if your symptoms do not start to get better or if they get worse. Your  condition will be monitored carefully while you are receiving this medicine. Wear a medical ID bracelet or chain. Carry a card that describes your disease and details of your medicine and dosage times. This medicine may increase your risk of getting an infection. Call your health care professional for advice if you get a fever, chills, or sore throat, or other symptoms of a cold or flu. Do not treat yourself. Try to avoid being around people who are sick. Call your health care professional if you are around anyone with measles, chickenpox, or if you develop sores or blisters that do not heal properly. If you are going to need surgery or other procedures, tell your doctor or health care professional that you have taken this medicine within the last 12 months. Ask your doctor or health care professional about your diet. You may need to lower the amount of salt you eat. This medicine may increase blood sugar. Ask your healthcare provider if changes in diet or medicines are needed if you have diabetes. What side effects may I notice from receiving this medication? Side effects that you should report to your doctor or health care professional as soon as possible: allergic reactions like skin rash, itching or hives, swelling of the face, lips, or tongue bloody or black, tarry stools changes in emotions or moods changes in vision confusion, excitement, restlessness depressed mood eye pain hallucinations muscle weakness severe or sudden stomach or belly pain signs and symptoms of high blood sugar such as being more thirsty or hungry or having to urinate more than normal. You may also feel very tired or have blurry vision. signs and symptoms of infection like fever; chills; cough; sore throat; pain or trouble passing urine swelling of ankles, feet unusual bruising or bleeding wounds that do not heal Side effects that usually do not require medical attention (report to your doctor or health care  professional if they continue or are bothersome): increased appetite increased growth of face or body hair headache nausea, vomiting pain, redness, or irritation at site where injected skin problems, acne, thin and shiny skin trouble sleeping weight gain This list may not describe all possible side effects. Call your doctor for medical advice about side effects. You may report side effects to FDA at 1-800-FDA-1088. Where should I keep my medication? This medicine is given in a hospital or clinic and will not be stored at home. NOTE: This sheet is a summary. It may not cover all possible information. If you have questions about this medicine, talk to your doctor, pharmacist, or health care provider.  2022 Elsevier/Gold Standard (2018-08-25 00:00:00)  Famotidine Solution for Injection What is this medication? FAMOTIDINE (fa MOE ti deen) treats stomach ulcers, reflux disease, or other conditions that cause too much stomach acid. It works by reducing the amount of acid in the stomach. This medicine may be used for other purposes; ask your health care provider or pharmacist if you have questions. COMMON BRAND NAME(S): Pepcid What should I tell my care team before I take this medication? They need to know if  you have any of these conditions: Kidney or liver disease An unusual or allergic reaction to famotidine, other medications, foods, dyes, or preservatives Pregnant or trying to get pregnant Breast-feeding How should I use this medication? This medication is for infusion into a vein. It is given in a hospital or clinic setting. Talk to your care team regarding the use of this medication in children. Special care may be needed. Overdosage: If you think you have taken too much of this medicine contact a poison control center or emergency room at once. NOTE: This medicine is only for you. Do not share this medicine with others. What if I miss a dose? This does not apply. What may interact  with this medication? Delavirdine Itraconazole Ketoconazole This list may not describe all possible interactions. Give your health care provider a list of all the medicines, herbs, non-prescription drugs, or dietary supplements you use. Also tell them if you smoke, drink alcohol, or use illegal drugs. Some items may interact with your medicine. What should I watch for while using this medication? Tell your doctor or health care professional if your condition does not start to get better or gets worse. Do not take with aspirin, ibuprofen, or other antiinflammatory medications. These can aggravate your condition. Do not smoke cigarettes or drink alcohol. These increase irritation in your stomach and can increase the time it will take for ulcers to heal. Cigarettes and alcohol can also worsen acid reflux or heartburn. If you get black, tarry stools or vomit up what looks like coffee grounds, call your doctor or health care professional at once. You may have a bleeding ulcer. This medication may cause a decrease in vitamin B12. Make sure that you get enough vitamin B12 while you are taking this medication. Discuss the foods you eat and the vitamins you take with your care team. What side effects may I notice from receiving this medication? Side effects that you should report to your care team as soon as possible: Allergic reactions--skin rash, itching, hives, swelling of the face, lips, tongue, or throat Confusion Hallucinations Side effects that usually do not require medical attention (report to your care team if they continue or are bothersome): Constipation Diarrhea Dizziness Headache This list may not describe all possible side effects. Call your doctor for medical advice about side effects. You may report side effects to FDA at 1-800-FDA-1088. Where should I keep my medication? This medication is given in a hospital or clinic. You will not be given this medication to store at home. NOTE:  This sheet is a summary. It may not cover all possible information. If you have questions about this medicine, talk to your doctor, pharmacist, or health care provider.  2022 Elsevier/Gold Standard (2020-02-13 00:00:00)  Paclitaxel injection What is this medication? PACLITAXEL (PAK li TAX el) is a chemotherapy drug. It targets fast dividing cells, like cancer cells, and causes these cells to die. This medicine is used to treat ovarian cancer, breast cancer, lung cancer, Kaposi's sarcoma, and other cancers. This medicine may be used for other purposes; ask your health care provider or pharmacist if you have questions. COMMON BRAND NAME(S): Onxol, Taxol What should I tell my care team before I take this medication? They need to know if you have any of these conditions: history of irregular heartbeat liver disease low blood counts, like low Amoria Mclees cell, platelet, or red cell counts lung or breathing disease, like asthma tingling of the fingers or toes, or other nerve disorder an unusual or  allergic reaction to paclitaxel, alcohol, polyoxyethylated castor oil, other chemotherapy, other medicines, foods, dyes, or preservatives pregnant or trying to get pregnant breast-feeding How should I use this medication? This drug is given as an infusion into a vein. It is administered in a hospital or clinic by a specially trained health care professional. Talk to your pediatrician regarding the use of this medicine in children. Special care may be needed. Overdosage: If you think you have taken too much of this medicine contact a poison control center or emergency room at once. NOTE: This medicine is only for you. Do not share this medicine with others. What if I miss a dose? It is important not to miss your dose. Call your doctor or health care professional if you are unable to keep an appointment. What may interact with this medication? Do not take this medicine with any of the following  medications: live virus vaccines This medicine may also interact with the following medications: antiviral medicines for hepatitis, HIV or AIDS certain antibiotics like erythromycin and clarithromycin certain medicines for fungal infections like ketoconazole and itraconazole certain medicines for seizures like carbamazepine, phenobarbital, phenytoin gemfibrozil nefazodone rifampin St. John's wort This list may not describe all possible interactions. Give your health care provider a list of all the medicines, herbs, non-prescription drugs, or dietary supplements you use. Also tell them if you smoke, drink alcohol, or use illegal drugs. Some items may interact with your medicine. What should I watch for while using this medication? Your condition will be monitored carefully while you are receiving this medicine. You will need important blood work done while you are taking this medicine. This medicine can cause serious allergic reactions. To reduce your risk you will need to take other medicine(s) before treatment with this medicine. If you experience allergic reactions like skin rash, itching or hives, swelling of the face, lips, or tongue, tell your doctor or health care professional right away. In some cases, you may be given additional medicines to help with side effects. Follow all directions for their use. This drug may make you feel generally unwell. This is not uncommon, as chemotherapy can affect healthy cells as well as cancer cells. Report any side effects. Continue your course of treatment even though you feel ill unless your doctor tells you to stop. Call your doctor or health care professional for advice if you get a fever, chills or sore throat, or other symptoms of a cold or flu. Do not treat yourself. This drug decreases your body's ability to fight infections. Try to avoid being around people who are sick. This medicine may increase your risk to bruise or bleed. Call your doctor or  health care professional if you notice any unusual bleeding. Be careful brushing and flossing your teeth or using a toothpick because you may get an infection or bleed more easily. If you have any dental work done, tell your dentist you are receiving this medicine. Avoid taking products that contain aspirin, acetaminophen, ibuprofen, naproxen, or ketoprofen unless instructed by your doctor. These medicines may hide a fever. Do not become pregnant while taking this medicine. Women should inform their doctor if they wish to become pregnant or think they might be pregnant. There is a potential for serious side effects to an unborn child. Talk to your health care professional or pharmacist for more information. Do not breast-feed an infant while taking this medicine. Men are advised not to father a child while receiving this medicine. This product may contain alcohol.  Ask your pharmacist or healthcare provider if this medicine contains alcohol. Be sure to tell all healthcare providers you are taking this medicine. Certain medicines, like metronidazole and disulfiram, can cause an unpleasant reaction when taken with alcohol. The reaction includes flushing, headache, nausea, vomiting, sweating, and increased thirst. The reaction can last from 30 minutes to several hours. What side effects may I notice from receiving this medication? Side effects that you should report to your doctor or health care professional as soon as possible: allergic reactions like skin rash, itching or hives, swelling of the face, lips, or tongue breathing problems changes in vision fast, irregular heartbeat high or low blood pressure mouth sores pain, tingling, numbness in the hands or feet signs of decreased platelets or bleeding - bruising, pinpoint red spots on the skin, black, tarry stools, blood in the urine signs of decreased red blood cells - unusually weak or tired, feeling faint or lightheaded, falls signs of infection -  fever or chills, cough, sore throat, pain or difficulty passing urine signs and symptoms of liver injury like dark yellow or brown urine; general ill feeling or flu-like symptoms; light-colored stools; loss of appetite; nausea; right upper belly pain; unusually weak or tired; yellowing of the eyes or skin swelling of the ankles, feet, hands unusually slow heartbeat Side effects that usually do not require medical attention (report to your doctor or health care professional if they continue or are bothersome): diarrhea hair loss loss of appetite muscle or joint pain nausea, vomiting pain, redness, or irritation at site where injected tiredness This list may not describe all possible side effects. Call your doctor for medical advice about side effects. You may report side effects to FDA at 1-800-FDA-1088. Where should I keep my medication? This drug is given in a hospital or clinic and will not be stored at home. NOTE: This sheet is a summary. It may not cover all possible information. If you have questions about this medicine, talk to your doctor, pharmacist, or health care provider.  2022 Elsevier/Gold Standard (2020-10-29 00:00:00)

## 2021-02-08 ENCOUNTER — Other Ambulatory Visit: Payer: Self-pay

## 2021-02-08 ENCOUNTER — Encounter: Payer: Self-pay | Admitting: Oncology

## 2021-02-08 NOTE — Telephone Encounter (Signed)
Amanda Skinner,   Is there a way to adjust a previous appt from showing "no show". She said the appt was supposed to be canceled and the other two were but the one that states "no show" was canceled as well.   Faythe Casa, NP 02/08/2021 6:49 PM

## 2021-02-10 ENCOUNTER — Inpatient Hospital Stay: Payer: 59

## 2021-02-10 NOTE — Telephone Encounter (Signed)
Not sure I can change the status of the appt can you look at it.

## 2021-02-11 ENCOUNTER — Inpatient Hospital Stay: Payer: 59

## 2021-02-11 NOTE — Telephone Encounter (Signed)
I will call and verify cause im not sure what she is talking bout I think she is talking about the shots that was cancelled so she wasn't a NS she didn't need them. Only thing I can think of

## 2021-02-12 ENCOUNTER — Inpatient Hospital Stay: Payer: 59

## 2021-02-13 ENCOUNTER — Other Ambulatory Visit: Payer: 59

## 2021-02-13 ENCOUNTER — Ambulatory Visit: Payer: 59 | Admitting: Oncology

## 2021-02-13 ENCOUNTER — Encounter: Payer: Self-pay | Admitting: Oncology

## 2021-02-13 ENCOUNTER — Inpatient Hospital Stay (HOSPITAL_BASED_OUTPATIENT_CLINIC_OR_DEPARTMENT_OTHER): Payer: 59 | Admitting: Oncology

## 2021-02-13 ENCOUNTER — Inpatient Hospital Stay: Payer: 59

## 2021-02-13 ENCOUNTER — Other Ambulatory Visit: Payer: Self-pay

## 2021-02-13 ENCOUNTER — Ambulatory Visit: Payer: 59

## 2021-02-13 VITALS — BP 112/78 | HR 96 | Temp 97.2°F | Resp 16 | Wt 135.8 lb

## 2021-02-13 VITALS — BP 130/82 | HR 72

## 2021-02-13 DIAGNOSIS — C50411 Malignant neoplasm of upper-outer quadrant of right female breast: Secondary | ICD-10-CM

## 2021-02-13 DIAGNOSIS — T451X5A Adverse effect of antineoplastic and immunosuppressive drugs, initial encounter: Secondary | ICD-10-CM

## 2021-02-13 DIAGNOSIS — D709 Neutropenia, unspecified: Secondary | ICD-10-CM | POA: Diagnosis not present

## 2021-02-13 DIAGNOSIS — Z5189 Encounter for other specified aftercare: Secondary | ICD-10-CM | POA: Diagnosis not present

## 2021-02-13 DIAGNOSIS — Z5111 Encounter for antineoplastic chemotherapy: Secondary | ICD-10-CM | POA: Diagnosis not present

## 2021-02-13 DIAGNOSIS — Z170 Estrogen receptor positive status [ER+]: Secondary | ICD-10-CM

## 2021-02-13 DIAGNOSIS — Z79899 Other long term (current) drug therapy: Secondary | ICD-10-CM | POA: Diagnosis not present

## 2021-02-13 DIAGNOSIS — D701 Agranulocytosis secondary to cancer chemotherapy: Secondary | ICD-10-CM | POA: Diagnosis not present

## 2021-02-13 LAB — COMPREHENSIVE METABOLIC PANEL
ALT: 57 U/L — ABNORMAL HIGH (ref 0–44)
AST: 33 U/L (ref 15–41)
Albumin: 3.9 g/dL (ref 3.5–5.0)
Alkaline Phosphatase: 78 U/L (ref 38–126)
Anion gap: 8 (ref 5–15)
BUN: 21 mg/dL (ref 8–23)
CO2: 26 mmol/L (ref 22–32)
Calcium: 9.4 mg/dL (ref 8.9–10.3)
Chloride: 103 mmol/L (ref 98–111)
Creatinine, Ser: 0.73 mg/dL (ref 0.44–1.00)
GFR, Estimated: >60 mL/min (ref >60)
Glucose, Bld: 135 mg/dL — ABNORMAL HIGH (ref 70–99)
Potassium: 3.8 mmol/L (ref 3.5–5.1)
Sodium: 137 mmol/L (ref 135–145)
Total Bilirubin: 0.3 mg/dL (ref 0.3–1.2)
Total Protein: 6.8 g/dL (ref 6.5–8.1)

## 2021-02-13 LAB — CBC WITH DIFFERENTIAL/PLATELET
Abs Immature Granulocytes: 0.01 10*3/uL (ref 0.00–0.07)
Basophils Absolute: 0 10*3/uL (ref 0.0–0.1)
Basophils Relative: 1 %
Eosinophils Absolute: 0.1 10*3/uL (ref 0.0–0.5)
Eosinophils Relative: 2 %
HCT: 32.4 % — ABNORMAL LOW (ref 36.0–46.0)
Hemoglobin: 10.8 g/dL — ABNORMAL LOW (ref 12.0–15.0)
Immature Granulocytes: 0 %
Lymphocytes Relative: 33 %
Lymphs Abs: 1.1 10*3/uL (ref 0.7–4.0)
MCH: 33.1 pg (ref 26.0–34.0)
MCHC: 33.3 g/dL (ref 30.0–36.0)
MCV: 99.4 fL (ref 80.0–100.0)
Monocytes Absolute: 0.2 10*3/uL (ref 0.1–1.0)
Monocytes Relative: 7 %
Neutro Abs: 1.9 10*3/uL (ref 1.7–7.7)
Neutrophils Relative %: 57 %
Platelets: 275 10*3/uL (ref 150–400)
RBC: 3.26 MIL/uL — ABNORMAL LOW (ref 3.87–5.11)
RDW: 14.1 % (ref 11.5–15.5)
WBC: 3.2 10*3/uL — ABNORMAL LOW (ref 4.0–10.5)
nRBC: 0 % (ref 0.0–0.2)

## 2021-02-13 MED ORDER — SODIUM CHLORIDE 0.9 % IV SOLN
1000.0000 mg | Freq: Once | INTRAVENOUS | Status: AC
  Administered 2021-02-13: 10:00:00 1000 mg via INTRAVENOUS
  Filled 2021-02-13: qty 50

## 2021-02-13 MED ORDER — SODIUM CHLORIDE 0.9 % IV SOLN
10.0000 mg | Freq: Once | INTRAVENOUS | Status: AC
  Administered 2021-02-13: 09:00:00 10 mg via INTRAVENOUS
  Filled 2021-02-13: qty 10

## 2021-02-13 MED ORDER — PALONOSETRON HCL INJECTION 0.25 MG/5ML
0.2500 mg | Freq: Once | INTRAVENOUS | Status: AC
  Administered 2021-02-13: 09:00:00 0.25 mg via INTRAVENOUS
  Filled 2021-02-13: qty 5

## 2021-02-13 MED ORDER — DOXORUBICIN HCL CHEMO IV INJECTION 2 MG/ML
100.0000 mg | Freq: Once | INTRAVENOUS | Status: AC
  Administered 2021-02-13: 10:00:00 100 mg via INTRAVENOUS
  Filled 2021-02-13: qty 50

## 2021-02-13 MED ORDER — SODIUM CHLORIDE 0.9 % IV SOLN
Freq: Once | INTRAVENOUS | Status: AC
  Filled 2021-02-13: qty 250

## 2021-02-13 MED ORDER — SODIUM CHLORIDE 0.9% FLUSH
10.0000 mL | INTRAVENOUS | Status: DC | PRN
  Administered 2021-02-13: 08:00:00 10 mL via INTRAVENOUS
  Filled 2021-02-13: qty 10

## 2021-02-13 MED ORDER — HEPARIN SOD (PORK) LOCK FLUSH 100 UNIT/ML IV SOLN
500.0000 [IU] | Freq: Once | INTRAVENOUS | Status: DC
  Filled 2021-02-13: qty 5

## 2021-02-13 MED ORDER — HEPARIN SOD (PORK) LOCK FLUSH 100 UNIT/ML IV SOLN
500.0000 [IU] | Freq: Once | INTRAVENOUS | Status: DC | PRN
  Filled 2021-02-13: qty 5

## 2021-02-13 MED ORDER — HEPARIN SOD (PORK) LOCK FLUSH 100 UNIT/ML IV SOLN
INTRAVENOUS | Status: AC
  Administered 2021-02-13: 11:00:00 500 [IU]
  Filled 2021-02-13: qty 5

## 2021-02-13 MED ORDER — SODIUM CHLORIDE 0.9 % IV SOLN
150.0000 mg | Freq: Once | INTRAVENOUS | Status: AC
  Administered 2021-02-13: 09:00:00 150 mg via INTRAVENOUS
  Filled 2021-02-13: qty 150

## 2021-02-13 MED ORDER — SODIUM CHLORIDE 0.9 % IV SOLN
200.0000 mg | Freq: Once | INTRAVENOUS | Status: AC
  Administered 2021-02-13: 09:00:00 200 mg via INTRAVENOUS
  Filled 2021-02-13: qty 8

## 2021-02-13 NOTE — Patient Instructions (Signed)
[facility] AT [place]  Discharge Instructions: Thank you for choosing [facility] to provide your oncology and hematology care.  If you have a lab appointment with the [facility], please go directly to the [facility] and check in at the registration area.  Wear comfortable clothing and clothing appropriate for easy access to any Portacath or PICC line.   We strive to give you quality time with your provider. You may need to reschedule your appointment if you arrive late (15 or more minutes).  Arriving late affects you and other patients whose appointments are after yours.  Also, if you miss three or more appointments without notifying the office, you may be dismissed from the clinic at the providers discretion.      For prescription refill requests, have your pharmacy contact our office and allow 72 hours for refills to be completed.    Today you received the following chemotherapy and/or immunotherapy agents: Keytruda, Adriamycin, Cytoxan      To help prevent nausea and vomiting after your treatment, we encourage you to take your nausea medication as directed.  BELOW ARE SYMPTOMS THAT SHOULD BE REPORTED IMMEDIATELY: *FEVER GREATER THAN 100.4 F (38 C) OR HIGHER *CHILLS OR SWEATING *NAUSEA AND VOMITING THAT IS NOT CONTROLLED WITH YOUR NAUSEA MEDICATION *UNUSUAL SHORTNESS OF BREATH *UNUSUAL BRUISING OR BLEEDING *URINARY PROBLEMS (pain or burning when urinating, or frequent urination) *BOWEL PROBLEMS (unusual diarrhea, constipation, pain near the anus) TENDERNESS IN MOUTH AND THROAT WITH OR WITHOUT PRESENCE OF ULCERS (sore throat, sores in mouth, or a toothache) UNUSUAL RASH, SWELLING OR PAIN  UNUSUAL VAGINAL DISCHARGE OR ITCHING   Items with * indicate a potential emergency and should be followed up as soon as possible or go to the Emergency Department if any problems should occur.  Please show the CHEMOTHERAPY ALERT CARD or IMMUNOTHERAPY ALERT  CARD at check-in to the Emergency Department and triage nurse.  Should you have questions after your visit or need to cancel or reschedule your appointment, please contact [facility] [place] AT [place]  [phone] and follow the prompts.  Office hours are 8:00 a.m. to 4:30 p.m. Monday - Friday. Please note that voicemails left after 4:00 p.m. may not be returned until the following business day.  We are closed weekends and major holidays. You have access to a nurse at all times for urgent questions. Please call the main number to the clinic [phone] and follow the prompts.  For any non-urgent questions, you may also contact your provider using MyChart. We now offer e-Visits for anyone 26 and older to request care online for non-urgent symptoms. For details visit [email or web address].   Also download the MyChart app! Go to the app store, search "MyChart", open the app, select [place], and log in with your MyChart username and password.  Due to Covid, a mask is required upon entering the hospital/clinic. If you do not have a mask, one will be given to you upon arrival. For doctor visits, patients may have 1 support person aged 1 or older with them. For treatment visits, patients cannot have anyone with them due to current Covid guidelines and our immunocompromised population. Cyclophosphamide Injection What is this medication? CYCLOPHOSPHAMIDE (sye kloe FOSS fa mide) is a chemotherapy drug. It slows the growth of cancer cells. This medicine is used to treat many types of cancer like lymphoma, myeloma, leukemia, breast cancer, and ovarian cancer, to name a few. This medicine may be used for other purposes; ask  your health care provider or pharmacist if you have questions. COMMON BRAND NAME(S): Cytoxan, Neosar What should I tell my care team before I take this medication? They need to know if you have any of these conditions: heart disease history of irregular  heartbeat infection kidney disease liver disease low blood counts, like white cells, platelets, or red blood cells on hemodialysis recent or ongoing radiation therapy scarring or thickening of the lungs trouble passing urine an unusual or allergic reaction to cyclophosphamide, other medicines, foods, dyes, or preservatives pregnant or trying to get pregnant breast-feeding How should I use this medication? This drug is usually given as an injection into a vein or muscle or by infusion into a vein. It is administered in a hospital or clinic by a specially trained health care professional. Talk to your pediatrician regarding the use of this medicine in children. Special care may be needed. Overdosage: If you think you have taken too much of this medicine contact a poison control center or emergency room at once. NOTE: This medicine is only for you. Do not share this medicine with others. What if I miss a dose? It is important not to miss your dose. Call your doctor or health care professional if you are unable to keep an appointment. What may interact with this medication? amphotericin B azathioprine certain antivirals for HIV or hepatitis certain medicines for blood pressure, heart disease, irregular heart beat certain medicines that treat or prevent blood clots like warfarin certain other medicines for cancer cyclosporine etanercept indomethacin medicines that relax muscles for surgery medicines to increase blood counts metronidazole This list may not describe all possible interactions. Give your health care provider a list of all the medicines, herbs, non-prescription drugs, or dietary supplements you use. Also tell them if you smoke, drink alcohol, or use illegal drugs. Some items may interact with your medicine. What should I watch for while using this medication? Your condition will be monitored carefully while you are receiving this medicine. You may need blood work done while  you are taking this medicine. Drink water or other fluids as directed. Urinate often, even at night. Some products may contain alcohol. Ask your health care professional if this medicine contains alcohol. Be sure to tell all health care professionals you are taking this medicine. Certain medicines, like metronidazole and disulfiram, can cause an unpleasant reaction when taken with alcohol. The reaction includes flushing, headache, nausea, vomiting, sweating, and increased thirst. The reaction can last from 30 minutes to several hours. Do not become pregnant while taking this medicine or for 1 year after stopping it. Women should inform their health care professional if they wish to become pregnant or think they might be pregnant. Men should not father a child while taking this medicine and for 4 months after stopping it. There is potential for serious side effects to an unborn child. Talk to your health care professional for more information. Do not breast-feed an infant while taking this medicine or for 1 week after stopping it. This medicine has caused ovarian failure in some women. This medicine may make it more difficult to get pregnant. Talk to your health care professional if you are concerned about your fertility. This medicine has caused decreased sperm counts in some men. This may make it more difficult to father a child. Talk to your health care professional if you are concerned about your fertility. Call your health care professional for advice if you get a fever, chills, or sore throat, or other  symptoms of a cold or flu. Do not treat yourself. This medicine decreases your body's ability to fight infections. Try to avoid being around people who are sick. Avoid taking medicines that contain aspirin, acetaminophen, ibuprofen, naproxen, or ketoprofen unless instructed by your health care professional. These medicines may hide a fever. Talk to your health care professional about your risk of cancer.  You may be more at risk for certain types of cancer if you take this medicine. If you are going to need surgery or other procedure, tell your health care professional that you are using this medicine. Be careful brushing or flossing your teeth or using a toothpick because you may get an infection or bleed more easily. If you have any dental work done, tell your dentist you are receiving this medicine. What side effects may I notice from receiving this medication? Side effects that you should report to your doctor or health care professional as soon as possible: allergic reactions like skin rash, itching or hives, swelling of the face, lips, or tongue breathing problems nausea, vomiting signs and symptoms of bleeding such as bloody or black, tarry stools; red or dark brown urine; spitting up blood or brown material that looks like coffee grounds; red spots on the skin; unusual bruising or bleeding from the eyes, gums, or nose signs and symptoms of heart failure like fast, irregular heartbeat, sudden weight gain; swelling of the ankles, feet, hands signs and symptoms of infection like fever; chills; cough; sore throat; pain or trouble passing urine signs and symptoms of kidney injury like trouble passing urine or change in the amount of urine signs and symptoms of liver injury like dark yellow or brown urine; general ill feeling or flu-like symptoms; light-colored stools; loss of appetite; nausea; right upper belly pain; unusually weak or tired; yellowing of the eyes or skin Side effects that usually do not require medical attention (report to your doctor or health care professional if they continue or are bothersome): confusion decreased hearing diarrhea facial flushing hair loss headache loss of appetite missed menstrual periods signs and symptoms of low red blood cells or anemia such as unusually weak or tired; feeling faint or lightheaded; falls skin discoloration This list may not describe  all possible side effects. Call your doctor for medical advice about side effects. You may report side effects to FDA at 1-800-FDA-1088. Where should I keep my medication? This drug is given in a hospital or clinic and will not be stored at home. NOTE: This sheet is a summary. It may not cover all possible information. If you have questions about this medicine, talk to your doctor, pharmacist, or health care provider.  2022 Elsevier/Gold Standard (2020-10-29 00:00:00) Doxorubicin injection What is this medication? DOXORUBICIN (dox oh ROO bi sin) is a chemotherapy drug. It is used to treat many kinds of cancer like leukemia, lymphoma, neuroblastoma, sarcoma, and Wilms' tumor. It is also used to treat bladder cancer, breast cancer, lung cancer, ovarian cancer, stomach cancer, and thyroid cancer. This medicine may be used for other purposes; ask your health care provider or pharmacist if you have questions. COMMON BRAND NAME(S): Adriamycin, Adriamycin PFS, Adriamycin RDF, Rubex What should I tell my care team before I take this medication? They need to know if you have any of these conditions: heart disease history of low blood counts caused by a medicine liver disease recent or ongoing radiation therapy an unusual or allergic reaction to doxorubicin, other chemotherapy agents, other medicines, foods, dyes, or preservatives pregnant  or trying to get pregnant breast-feeding How should I use this medication? This drug is given as an infusion into a vein. It is administered in a hospital or clinic by a specially trained health care professional. If you have pain, swelling, burning or any unusual feeling around the site of your injection, tell your health care professional right away. Talk to your pediatrician regarding the use of this medicine in children. Special care may be needed. Overdosage: If you think you have taken too much of this medicine contact a poison control center or emergency room  at once. NOTE: This medicine is only for you. Do not share this medicine with others. What if I miss a dose? It is important not to miss your dose. Call your doctor or health care professional if you are unable to keep an appointment. What may interact with this medication? This medicine may interact with the following medications: 6-mercaptopurine paclitaxel phenytoin St. John's Wort trastuzumab verapamil This list may not describe all possible interactions. Give your health care provider a list of all the medicines, herbs, non-prescription drugs, or dietary supplements you use. Also tell them if you smoke, drink alcohol, or use illegal drugs. Some items may interact with your medicine. What should I watch for while using this medication? This drug may make you feel generally unwell. This is not uncommon, as chemotherapy can affect healthy cells as well as cancer cells. Report any side effects. Continue your course of treatment even though you feel ill unless your doctor tells you to stop. There is a maximum amount of this medicine you should receive throughout your life. The amount depends on the medical condition being treated and your overall health. Your doctor will watch how much of this medicine you receive in your lifetime. Tell your doctor if you have taken this medicine before. You may need blood work done while you are taking this medicine. Your urine may turn red for a few days after your dose. This is not blood. If your urine is dark or brown, call your doctor. In some cases, you may be given additional medicines to help with side effects. Follow all directions for their use. Call your doctor or health care professional for advice if you get a fever, chills or sore throat, or other symptoms of a cold or flu. Do not treat yourself. This drug decreases your body's ability to fight infections. Try to avoid being around people who are sick. This medicine may increase your risk to bruise  or bleed. Call your doctor or health care professional if you notice any unusual bleeding. Talk to your doctor about your risk of cancer. You may be more at risk for certain types of cancers if you take this medicine. Do not become pregnant while taking this medicine or for 6 months after stopping it. Women should inform their doctor if they wish to become pregnant or think they might be pregnant. Men should not father a child while taking this medicine and for 6 months after stopping it. There is a potential for serious side effects to an unborn child. Talk to your health care professional or pharmacist for more information. Do not breast-feed an infant while taking this medicine. This medicine has caused ovarian failure in some women and reduced sperm counts in some men This medicine may interfere with the ability to have a child. Talk with your doctor or health care professional if you are concerned about your fertility. This medicine may cause a decrease in  Co-Enzyme Q-10. You should make sure that you get enough Co-Enzyme Q-10 while you are taking this medicine. Discuss the foods you eat and the vitamins you take with your health care professional. What side effects may I notice from receiving this medication? Side effects that you should report to your doctor or health care professional as soon as possible: allergic reactions like skin rash, itching or hives, swelling of the face, lips, or tongue breathing problems chest pain fast or irregular heartbeat low blood counts - this medicine may decrease the number of white blood cells, red blood cells and platelets. You may be at increased risk for infections and bleeding. pain, redness, or irritation at site where injected signs of infection - fever or chills, cough, sore throat, pain or difficulty passing urine signs of decreased platelets or bleeding - bruising, pinpoint red spots on the skin, black, tarry stools, blood in the urine swelling of  the ankles, feet, hands tiredness weakness Side effects that usually do not require medical attention (report to your doctor or health care professional if they continue or are bothersome): diarrhea hair loss mouth sores nail discoloration or damage nausea red colored urine vomiting This list may not describe all possible side effects. Call your doctor for medical advice about side effects. You may report side effects to FDA at 1-800-FDA-1088. Where should I keep my medication? This drug is given in a hospital or clinic and will not be stored at home. NOTE: This sheet is a summary. It may not cover all possible information. If you have questions about this medicine, talk to your doctor, pharmacist, or health care provider.  2022 Elsevier/Gold Standard (2016-10-15 00:00:00) Pembrolizumab injection What is this medication? PEMBROLIZUMAB (pem broe liz ue mab) is a monoclonal antibody. It is used to treat certain types of cancer. This medicine may be used for other purposes; ask your health care provider or pharmacist if you have questions. COMMON BRAND NAME(S): Keytruda What should I tell my care team before I take this medication? They need to know if you have any of these conditions: autoimmune diseases like Crohn's disease, ulcerative colitis, or lupus have had or planning to have an allogeneic stem cell transplant (uses someone else's stem cells) history of organ transplant history of chest radiation nervous system problems like myasthenia gravis or Guillain-Barre syndrome an unusual or allergic reaction to pembrolizumab, other medicines, foods, dyes, or preservatives pregnant or trying to get pregnant breast-feeding How should I use this medication? This medicine is for infusion into a vein. It is given by a health care professional in a hospital or clinic setting. A special MedGuide will be given to you before each treatment. Be sure to read this information carefully each  time. Talk to your pediatrician regarding the use of this medicine in children. While this drug may be prescribed for children as young as 6 months for selected conditions, precautions do apply. Overdosage: If you think you have taken too much of this medicine contact a poison control center or emergency room at once. NOTE: This medicine is only for you. Do not share this medicine with others. What if I miss a dose? It is important not to miss your dose. Call your doctor or health care professional if you are unable to keep an appointment. What may interact with this medication? Interactions have not been studied. This list may not describe all possible interactions. Give your health care provider a list of all the medicines, herbs, non-prescription drugs, or dietary supplements  you use. Also tell them if you smoke, drink alcohol, or use illegal drugs. Some items may interact with your medicine. What should I watch for while using this medication? Your condition will be monitored carefully while you are receiving this medicine. You may need blood work done while you are taking this medicine. Do not become pregnant while taking this medicine or for 4 months after stopping it. Women should inform their doctor if they wish to become pregnant or think they might be pregnant. There is a potential for serious side effects to an unborn child. Talk to your health care professional or pharmacist for more information. Do not breast-feed an infant while taking this medicine or for 4 months after the last dose. What side effects may I notice from receiving this medication? Side effects that you should report to your doctor or health care professional as soon as possible: allergic reactions like skin rash, itching or hives, swelling of the face, lips, or tongue bloody or black, tarry breathing problems changes in vision chest pain chills confusion constipation cough diarrhea dizziness or feeling faint or  lightheaded fast or irregular heartbeat fever flushing joint pain low blood counts - this medicine may decrease the number of white blood cells, red blood cells and platelets. You may be at increased risk for infections and bleeding. muscle pain muscle weakness pain, tingling, numbness in the hands or feet persistent headache redness, blistering, peeling or loosening of the skin, including inside the mouth signs and symptoms of high blood sugar such as dizziness; dry mouth; dry skin; fruity breath; nausea; stomach pain; increased hunger or thirst; increased urination signs and symptoms of kidney injury like trouble passing urine or change in the amount of urine signs and symptoms of liver injury like dark urine, light-colored stools, loss of appetite, nausea, right upper belly pain, yellowing of the eyes or skin sweating swollen lymph nodes weight loss Side effects that usually do not require medical attention (report to your doctor or health care professional if they continue or are bothersome): decreased appetite hair loss tiredness This list may not describe all possible side effects. Call your doctor for medical advice about side effects. You may report side effects to FDA at 1-800-FDA-1088. Where should I keep my medication? This drug is given in a hospital or clinic and will not be stored at home. NOTE: This sheet is a summary. It may not cover all possible information. If you have questions about this medicine, talk to your doctor, pharmacist, or health care provider.  2022 Elsevier/Gold Standard (2020-10-29 00:00:00)
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
## 2021-02-14 ENCOUNTER — Encounter: Payer: Self-pay | Admitting: Oncology

## 2021-02-14 ENCOUNTER — Inpatient Hospital Stay: Payer: 59

## 2021-02-14 DIAGNOSIS — Z5111 Encounter for antineoplastic chemotherapy: Secondary | ICD-10-CM | POA: Diagnosis not present

## 2021-02-14 DIAGNOSIS — C50411 Malignant neoplasm of upper-outer quadrant of right female breast: Secondary | ICD-10-CM | POA: Diagnosis not present

## 2021-02-14 DIAGNOSIS — Z79899 Other long term (current) drug therapy: Secondary | ICD-10-CM | POA: Diagnosis not present

## 2021-02-14 DIAGNOSIS — Z5189 Encounter for other specified aftercare: Secondary | ICD-10-CM | POA: Diagnosis not present

## 2021-02-14 DIAGNOSIS — D709 Neutropenia, unspecified: Secondary | ICD-10-CM | POA: Diagnosis not present

## 2021-02-14 MED ORDER — PEGFILGRASTIM-BMEZ 6 MG/0.6ML ~~LOC~~ SOSY
6.0000 mg | PREFILLED_SYRINGE | Freq: Once | SUBCUTANEOUS | Status: AC
  Administered 2021-02-14: 11:00:00 6 mg via SUBCUTANEOUS
  Filled 2021-02-14: qty 0.6

## 2021-02-18 NOTE — Telephone Encounter (Signed)
I had called and spoke to pt. And it was for the wbc shot that is long acting and we checked it the next day and it was approved

## 2021-02-19 ENCOUNTER — Other Ambulatory Visit: Payer: 59

## 2021-02-19 ENCOUNTER — Ambulatory Visit: Payer: 59

## 2021-02-19 ENCOUNTER — Ambulatory Visit: Payer: 59 | Admitting: Oncology

## 2021-02-20 ENCOUNTER — Encounter: Payer: Self-pay | Admitting: Oncology

## 2021-02-21 ENCOUNTER — Telehealth: Payer: Self-pay | Admitting: *Deleted

## 2021-02-21 ENCOUNTER — Encounter: Payer: Self-pay | Admitting: Oncology

## 2021-02-21 ENCOUNTER — Ambulatory Visit: Payer: 59

## 2021-02-21 NOTE — Telephone Encounter (Signed)
I faxed it over this evening

## 2021-02-21 NOTE — Telephone Encounter (Signed)
I called the pt. And she said that she did not have sores in her mouth. She just felt it is irritated at back of throat., if she drinks hot liquids it soothes that. I told her that [name] was not working today but Ander Purpura the other NP sent me  a message to send magic mout wash in to you pharmacy but by the time I read it ; it was too late to get it to her [facility] pharmacy . I asked if I could send it to another pharmacy that is open now. I also said that sometimes pt's mouth can get sensitive and not have sore in mouth and she can use biotene. She said she would get some of that and if I could send MMW to [place] and she will pick it up when they are open

## 2021-02-21 NOTE — Telephone Encounter (Signed)
Completed and Faxed disability forms to [place]. Patient is aware (see mychart correspondence to patient).

## 2021-02-24 ENCOUNTER — Encounter: Payer: Self-pay | Admitting: Oncology

## 2021-02-24 ENCOUNTER — Other Ambulatory Visit: Payer: Self-pay

## 2021-02-24 MED ORDER — STERILE WATER FOR INJECTION IJ SOLN
OROMUCOSAL | 3 refills | Status: DC
  Filled 2021-02-24: qty 480, 12d supply, fill #0
  Filled 2021-03-17: qty 240, 6d supply, fill #1
  Filled 2021-12-15: qty 240, 6d supply, fill #2

## 2021-02-25 ENCOUNTER — Telehealth: Payer: Self-pay | Admitting: *Deleted

## 2021-02-25 NOTE — Telephone Encounter (Signed)
Called pt this am. We had spoke on Friday about her sore throat. She says she can't floss between 2 of my teeth and has appt  for dentis 1/3. I told her that I will check with [name] and because pt is on treatment she will not be allowed to get cleaning. If there is something bad going on that needs to be managed then dentist will need to call cancer center. I checked with [name] this am and it is exactly what she wants done. Pt knows about this

## 2021-02-27 ENCOUNTER — Other Ambulatory Visit: Payer: Self-pay | Admitting: *Deleted

## 2021-02-27 DIAGNOSIS — C50411 Malignant neoplasm of upper-outer quadrant of right female breast: Secondary | ICD-10-CM

## 2021-03-07 ENCOUNTER — Encounter: Payer: Self-pay | Admitting: Oncology

## 2021-03-07 ENCOUNTER — Inpatient Hospital Stay: Payer: 59

## 2021-03-07 ENCOUNTER — Inpatient Hospital Stay (HOSPITAL_BASED_OUTPATIENT_CLINIC_OR_DEPARTMENT_OTHER): Payer: 59 | Admitting: Oncology

## 2021-03-07 ENCOUNTER — Other Ambulatory Visit: Payer: Self-pay

## 2021-03-07 ENCOUNTER — Inpatient Hospital Stay: Payer: 59 | Attending: Oncology

## 2021-03-07 VITALS — BP 117/75 | HR 89 | Temp 96.7°F | Resp 18 | Wt 137.0 lb

## 2021-03-07 DIAGNOSIS — Z5111 Encounter for antineoplastic chemotherapy: Secondary | ICD-10-CM | POA: Insufficient documentation

## 2021-03-07 DIAGNOSIS — Z79899 Other long term (current) drug therapy: Secondary | ICD-10-CM | POA: Diagnosis not present

## 2021-03-07 DIAGNOSIS — Z5189 Encounter for other specified aftercare: Secondary | ICD-10-CM | POA: Insufficient documentation

## 2021-03-07 DIAGNOSIS — C50411 Malignant neoplasm of upper-outer quadrant of right female breast: Secondary | ICD-10-CM | POA: Insufficient documentation

## 2021-03-07 DIAGNOSIS — Z170 Estrogen receptor positive status [ER+]: Secondary | ICD-10-CM

## 2021-03-07 DIAGNOSIS — Z5112 Encounter for antineoplastic immunotherapy: Secondary | ICD-10-CM | POA: Insufficient documentation

## 2021-03-07 LAB — COMPREHENSIVE METABOLIC PANEL
ALT: 18 U/L (ref 0–44)
AST: 23 U/L (ref 15–41)
Albumin: 4.1 g/dL (ref 3.5–5.0)
Alkaline Phosphatase: 81 U/L (ref 38–126)
Anion gap: 6 (ref 5–15)
BUN: 14 mg/dL (ref 8–23)
CO2: 26 mmol/L (ref 22–32)
Calcium: 8.9 mg/dL (ref 8.9–10.3)
Chloride: 103 mmol/L (ref 98–111)
Creatinine, Ser: 0.67 mg/dL (ref 0.44–1.00)
GFR, Estimated: >60 mL/min (ref >60)
Glucose, Bld: 117 mg/dL — ABNORMAL HIGH (ref 70–99)
Potassium: 3.4 mmol/L — ABNORMAL LOW (ref 3.5–5.1)
Sodium: 135 mmol/L (ref 135–145)
Total Bilirubin: 0.6 mg/dL (ref 0.3–1.2)
Total Protein: 6.9 g/dL (ref 6.5–8.1)

## 2021-03-07 LAB — CBC WITH DIFFERENTIAL/PLATELET
Abs Immature Granulocytes: 0.01 10*3/uL (ref 0.00–0.07)
Basophils Absolute: 0.1 10*3/uL (ref 0.0–0.1)
Basophils Relative: 1 %
Eosinophils Absolute: 0 10*3/uL (ref 0.0–0.5)
Eosinophils Relative: 0 %
HCT: 33.9 % — ABNORMAL LOW (ref 36.0–46.0)
Hemoglobin: 11.3 g/dL — ABNORMAL LOW (ref 12.0–15.0)
Immature Granulocytes: 0 %
Lymphocytes Relative: 16 %
Lymphs Abs: 1 10*3/uL (ref 0.7–4.0)
MCH: 32.7 pg (ref 26.0–34.0)
MCHC: 33.3 g/dL (ref 30.0–36.0)
MCV: 98 fL (ref 80.0–100.0)
Monocytes Absolute: 0.5 10*3/uL (ref 0.1–1.0)
Monocytes Relative: 9 %
Neutro Abs: 4.5 10*3/uL (ref 1.7–7.7)
Neutrophils Relative %: 74 %
Platelets: 285 10*3/uL (ref 150–400)
RBC: 3.46 MIL/uL — ABNORMAL LOW (ref 3.87–5.11)
RDW: 13.8 % (ref 11.5–15.5)
WBC: 6.1 10*3/uL (ref 4.0–10.5)
nRBC: 0 % (ref 0.0–0.2)

## 2021-03-07 MED ORDER — SODIUM CHLORIDE 0.9 % IV SOLN
200.0000 mg | Freq: Once | INTRAVENOUS | Status: AC
  Administered 2021-03-07: 200 mg via INTRAVENOUS
  Filled 2021-03-07: qty 8

## 2021-03-07 MED ORDER — HEPARIN SOD (PORK) LOCK FLUSH 100 UNIT/ML IV SOLN
500.0000 [IU] | Freq: Once | INTRAVENOUS | Status: AC | PRN
  Administered 2021-03-07: 500 [IU]
  Filled 2021-03-07: qty 5

## 2021-03-07 MED ORDER — SODIUM CHLORIDE 0.9 % IV SOLN
1000.0000 mg | Freq: Once | INTRAVENOUS | Status: AC
  Administered 2021-03-07: 1000 mg via INTRAVENOUS
  Filled 2021-03-07 (×2): qty 50

## 2021-03-07 MED ORDER — PALONOSETRON HCL INJECTION 0.25 MG/5ML
0.2500 mg | Freq: Once | INTRAVENOUS | Status: AC
  Administered 2021-03-07: 0.25 mg via INTRAVENOUS
  Filled 2021-03-07: qty 5

## 2021-03-07 MED ORDER — SODIUM CHLORIDE 0.9 % IV SOLN
150.0000 mg | Freq: Once | INTRAVENOUS | Status: AC
  Administered 2021-03-07: 150 mg via INTRAVENOUS
  Filled 2021-03-07: qty 150

## 2021-03-07 MED ORDER — DOXORUBICIN HCL CHEMO IV INJECTION 2 MG/ML
100.0000 mg | Freq: Once | INTRAVENOUS | Status: AC
  Administered 2021-03-07: 100 mg via INTRAVENOUS
  Filled 2021-03-07: qty 50

## 2021-03-07 MED ORDER — DOXORUBICIN HCL CHEMO IV INJECTION 2 MG/ML: 60.0000 mg/m2 | Freq: Once | INTRAVENOUS | Status: DC

## 2021-03-07 MED ORDER — SODIUM CHLORIDE 0.9 % IV SOLN
Freq: Once | INTRAVENOUS | Status: AC
  Filled 2021-03-07: qty 250

## 2021-03-07 MED ORDER — SODIUM CHLORIDE 0.9 % IV SOLN
10.0000 mg | Freq: Once | INTRAVENOUS | Status: AC
  Administered 2021-03-07: 10 mg via INTRAVENOUS
  Filled 2021-03-07: qty 10

## 2021-03-07 NOTE — Progress Notes (Signed)
Patient here for oncology follow-up appointment, concerns of dizziness and low appetite
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
## 2021-03-09 LAB — TSH: TSH: 2.824 u[IU]/mL (ref 0.350–4.500)

## 2021-03-10 ENCOUNTER — Other Ambulatory Visit: Payer: Self-pay

## 2021-03-10 ENCOUNTER — Inpatient Hospital Stay: Payer: 59

## 2021-03-10 DIAGNOSIS — Z79899 Other long term (current) drug therapy: Secondary | ICD-10-CM | POA: Diagnosis not present

## 2021-03-10 DIAGNOSIS — Z5111 Encounter for antineoplastic chemotherapy: Secondary | ICD-10-CM | POA: Diagnosis not present

## 2021-03-10 DIAGNOSIS — C50411 Malignant neoplasm of upper-outer quadrant of right female breast: Secondary | ICD-10-CM | POA: Diagnosis not present

## 2021-03-10 DIAGNOSIS — Z5189 Encounter for other specified aftercare: Secondary | ICD-10-CM | POA: Diagnosis not present

## 2021-03-10 DIAGNOSIS — Z5112 Encounter for antineoplastic immunotherapy: Secondary | ICD-10-CM | POA: Diagnosis not present

## 2021-03-10 MED ORDER — PEGFILGRASTIM-BMEZ 6 MG/0.6ML ~~LOC~~ SOSY
6.0000 mg | PREFILLED_SYRINGE | Freq: Once | SUBCUTANEOUS | Status: AC
  Administered 2021-03-10: 6 mg via SUBCUTANEOUS
  Filled 2021-03-10: qty 0.6

## 2021-03-17 ENCOUNTER — Other Ambulatory Visit: Payer: Self-pay | Admitting: Oncology

## 2021-03-17 ENCOUNTER — Other Ambulatory Visit: Payer: Self-pay

## 2021-03-17 MED ORDER — PANTOPRAZOLE SODIUM 20 MG PO TBEC
20.0000 mg | DELAYED_RELEASE_TABLET | Freq: Every day | ORAL | 2 refills | Status: DC
  Filled 2021-03-17: qty 30, 30d supply, fill #0
  Filled 2021-04-15: qty 30, 30d supply, fill #1
  Filled 2021-05-14: qty 30, 30d supply, fill #2

## 2021-03-25 ENCOUNTER — Other Ambulatory Visit: Payer: Self-pay

## 2021-03-28 ENCOUNTER — Inpatient Hospital Stay (HOSPITAL_BASED_OUTPATIENT_CLINIC_OR_DEPARTMENT_OTHER): Payer: 59 | Admitting: Oncology

## 2021-03-28 ENCOUNTER — Inpatient Hospital Stay: Payer: 59 | Attending: Oncology

## 2021-03-28 ENCOUNTER — Other Ambulatory Visit: Payer: Self-pay

## 2021-03-28 ENCOUNTER — Inpatient Hospital Stay: Payer: 59

## 2021-03-28 ENCOUNTER — Encounter: Payer: Self-pay | Admitting: Oncology

## 2021-03-28 VITALS — BP 103/68 | HR 83

## 2021-03-28 VITALS — BP 112/69 | HR 89 | Temp 97.8°F | Resp 16 | Wt 138.0 lb

## 2021-03-28 DIAGNOSIS — Z170 Estrogen receptor positive status [ER+]: Secondary | ICD-10-CM

## 2021-03-28 DIAGNOSIS — Z5112 Encounter for antineoplastic immunotherapy: Secondary | ICD-10-CM | POA: Diagnosis not present

## 2021-03-28 DIAGNOSIS — C50411 Malignant neoplasm of upper-outer quadrant of right female breast: Secondary | ICD-10-CM

## 2021-03-28 DIAGNOSIS — Z5189 Encounter for other specified aftercare: Secondary | ICD-10-CM | POA: Insufficient documentation

## 2021-03-28 DIAGNOSIS — Z5111 Encounter for antineoplastic chemotherapy: Secondary | ICD-10-CM | POA: Insufficient documentation

## 2021-03-28 DIAGNOSIS — R110 Nausea: Secondary | ICD-10-CM

## 2021-03-28 LAB — CBC WITH DIFFERENTIAL/PLATELET
Abs Immature Granulocytes: 0.02 10*3/uL (ref 0.00–0.07)
Basophils Absolute: 0 10*3/uL (ref 0.0–0.1)
Basophils Relative: 1 %
Eosinophils Absolute: 0 10*3/uL (ref 0.0–0.5)
Eosinophils Relative: 0 %
HCT: 32.1 % — ABNORMAL LOW (ref 36.0–46.0)
Hemoglobin: 10.6 g/dL — ABNORMAL LOW (ref 12.0–15.0)
Immature Granulocytes: 0 %
Lymphocytes Relative: 14 %
Lymphs Abs: 0.9 10*3/uL (ref 0.7–4.0)
MCH: 31.7 pg (ref 26.0–34.0)
MCHC: 33 g/dL (ref 30.0–36.0)
MCV: 96.1 fL (ref 80.0–100.0)
Monocytes Absolute: 0.6 10*3/uL (ref 0.1–1.0)
Monocytes Relative: 10 %
Neutro Abs: 4.6 10*3/uL (ref 1.7–7.7)
Neutrophils Relative %: 75 %
Platelets: 322 10*3/uL (ref 150–400)
RBC: 3.34 MIL/uL — ABNORMAL LOW (ref 3.87–5.11)
RDW: 14.6 % (ref 11.5–15.5)
WBC: 6.2 10*3/uL (ref 4.0–10.5)
nRBC: 0 % (ref 0.0–0.2)

## 2021-03-28 LAB — COMPREHENSIVE METABOLIC PANEL
ALT: 20 U/L (ref 0–44)
AST: 21 U/L (ref 15–41)
Albumin: 4.1 g/dL (ref 3.5–5.0)
Alkaline Phosphatase: 79 U/L (ref 38–126)
Anion gap: 8 (ref 5–15)
BUN: 13 mg/dL (ref 8–23)
CO2: 26 mmol/L (ref 22–32)
Calcium: 9.2 mg/dL (ref 8.9–10.3)
Chloride: 102 mmol/L (ref 98–111)
Creatinine, Ser: 0.71 mg/dL (ref 0.44–1.00)
GFR, Estimated: >60 mL/min (ref >60)
Glucose, Bld: 88 mg/dL (ref 70–99)
Potassium: 3.9 mmol/L (ref 3.5–5.1)
Sodium: 136 mmol/L (ref 135–145)
Total Bilirubin: 0.4 mg/dL (ref 0.3–1.2)
Total Protein: 6.9 g/dL (ref 6.5–8.1)

## 2021-03-28 MED ORDER — PROCHLORPERAZINE EDISYLATE 10 MG/2ML IJ SOLN
10.0000 mg | Freq: Once | INTRAMUSCULAR | Status: AC
  Administered 2021-03-28: 10 mg via INTRAVENOUS
  Filled 2021-03-28: qty 2

## 2021-03-28 MED ORDER — SODIUM CHLORIDE 0.9 % IV SOLN
Freq: Once | INTRAVENOUS | Status: AC
  Filled 2021-03-28: qty 250

## 2021-03-28 MED ORDER — HEPARIN SOD (PORK) LOCK FLUSH 100 UNIT/ML IV SOLN
500.0000 [IU] | Freq: Once | INTRAVENOUS | Status: AC | PRN
  Administered 2021-03-28: 500 [IU]
  Filled 2021-03-28: qty 5

## 2021-03-28 MED ORDER — PALONOSETRON HCL INJECTION 0.25 MG/5ML
0.2500 mg | Freq: Once | INTRAVENOUS | Status: AC
  Administered 2021-03-28: 0.25 mg via INTRAVENOUS
  Filled 2021-03-28: qty 5

## 2021-03-28 MED ORDER — SODIUM CHLORIDE 0.9 % IV SOLN
200.0000 mg | Freq: Once | INTRAVENOUS | Status: AC
  Administered 2021-03-28: 200 mg via INTRAVENOUS
  Filled 2021-03-28: qty 200

## 2021-03-28 MED ORDER — SODIUM CHLORIDE 0.9 % IV SOLN
630.0000 mg/m2 | Freq: Once | INTRAVENOUS | Status: AC
  Administered 2021-03-28: 1000 mg via INTRAVENOUS
  Filled 2021-03-28: qty 50

## 2021-03-28 MED ORDER — SODIUM CHLORIDE 0.9 % IV SOLN
150.0000 mg | Freq: Once | INTRAVENOUS | Status: AC
  Administered 2021-03-28: 150 mg via INTRAVENOUS
  Filled 2021-03-28: qty 150

## 2021-03-28 MED ORDER — SODIUM CHLORIDE 0.9 % IV SOLN
10.0000 mg | Freq: Once | INTRAVENOUS | Status: AC
  Administered 2021-03-28: 10 mg via INTRAVENOUS
  Filled 2021-03-28: qty 10

## 2021-03-28 MED ORDER — DOXORUBICIN HCL CHEMO IV INJECTION 2 MG/ML
63.0000 mg/m2 | Freq: Once | INTRAVENOUS | Status: AC
  Administered 2021-03-28: 100 mg via INTRAVENOUS
  Filled 2021-03-28: qty 50

## 2021-03-28 NOTE — Progress Notes (Signed)
Pt in for follow up and treatment.  Reports increased nausea this time and is using zofran regularly.  States appetite is 85%.

## 2021-03-28 NOTE — Patient Instructions (Signed)
[facility]  Discharge Instructions: ?Thank you for choosing [facility] to provide your oncology and hematology care.  ?If you have a lab appointment with the Cancer Center, please go directly to the Cancer Center and check in at the registration area. ? ?Wear comfortable clothing and clothing appropriate for easy access to any Portacath or PICC line.  ? ?We strive to give you quality time with your provider. You may need to reschedule your appointment if you arrive late (15 or more minutes).  Arriving late affects you and other patients whose appointments are after yours.  Also, if you miss three or more appointments without notifying the office, you may be dismissed from the clinic at the provider?s discretion.    ?  ?For prescription refill requests, have your pharmacy contact our office and allow 72 hours for refills to be completed.   ? ?  ?To help prevent nausea and vomiting after your treatment, we encourage you to take your nausea medication as directed. ? ?BELOW ARE SYMPTOMS THAT SHOULD BE REPORTED IMMEDIATELY: ?*FEVER GREATER THAN 100.4 F (38 ?C) OR HIGHER ?*CHILLS OR SWEATING ?*NAUSEA AND VOMITING THAT IS NOT CONTROLLED WITH YOUR NAUSEA MEDICATION ?*UNUSUAL SHORTNESS OF BREATH ?*UNUSUAL BRUISING OR BLEEDING ?*URINARY PROBLEMS (pain or burning when urinating, or frequent urination) ?*BOWEL PROBLEMS (unusual diarrhea, constipation, pain near the anus) ?TENDERNESS IN MOUTH AND THROAT WITH OR WITHOUT PRESENCE OF ULCERS (sore throat, sores in mouth, or a toothache) ?UNUSUAL RASH, SWELLING OR PAIN  ?UNUSUAL VAGINAL DISCHARGE OR ITCHING  ? ?Items with * indicate a potential emergency and should be followed up as soon as possible or go to the Emergency Department if any problems should occur. ? ?Please show the CHEMOTHERAPY ALERT CARD or IMMUNOTHERAPY ALERT CARD at check-in to the Emergency Department and triage nurse. ? ?Should you have questions after your visit  or need to cancel or reschedule your appointment, please contact [facility]  [phone] and follow the prompts.  Office hours are 8:00 a.m. to 4:30 p.m. Monday - Friday. Please note that voicemails left after 4:00 p.m. may not be returned until the following business day.  We are closed weekends and major holidays. You have access to a nurse at all times for urgent questions. Please call the main number to the clinic [phone] and follow the prompts. ? ?For any non-urgent questions, you may also contact your provider using MyChart. We now offer e-Visits for anyone 18 and older to request care online for non-urgent symptoms. For details visit [email or web address]. ?  ?Also download the MyChart app! Go to the app store, search "MyChart", open the app, select [facility], and log in with your MyChart username and password. ? ?Due to Covid, a mask is required upon entering the hospital/clinic. If you do not have a mask, one will be given to you upon arrival. For doctor visits, patients may have 1 support person aged 18 or older with them. For treatment visits, patients cannot have anyone with them due to current Covid guidelines and our immunocompromised population.  ?
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
## 2021-03-31 ENCOUNTER — Inpatient Hospital Stay: Payer: 59

## 2021-03-31 ENCOUNTER — Other Ambulatory Visit: Payer: Self-pay

## 2021-03-31 DIAGNOSIS — C50411 Malignant neoplasm of upper-outer quadrant of right female breast: Secondary | ICD-10-CM

## 2021-03-31 DIAGNOSIS — Z5112 Encounter for antineoplastic immunotherapy: Secondary | ICD-10-CM | POA: Diagnosis not present

## 2021-03-31 DIAGNOSIS — Z170 Estrogen receptor positive status [ER+]: Secondary | ICD-10-CM

## 2021-03-31 DIAGNOSIS — Z5111 Encounter for antineoplastic chemotherapy: Secondary | ICD-10-CM | POA: Diagnosis not present

## 2021-03-31 DIAGNOSIS — Z5189 Encounter for other specified aftercare: Secondary | ICD-10-CM | POA: Diagnosis not present

## 2021-03-31 MED ORDER — PEGFILGRASTIM-BMEZ 6 MG/0.6ML ~~LOC~~ SOSY
6.0000 mg | PREFILLED_SYRINGE | Freq: Once | SUBCUTANEOUS | Status: AC
  Administered 2021-03-31: 6 mg via SUBCUTANEOUS
  Filled 2021-03-31: qty 0.6

## 2021-04-02 ENCOUNTER — Other Ambulatory Visit: Payer: Self-pay | Admitting: Oncology

## 2021-04-02 ENCOUNTER — Encounter: Payer: Self-pay | Admitting: Oncology

## 2021-04-02 ENCOUNTER — Other Ambulatory Visit: Payer: Self-pay

## 2021-04-02 DIAGNOSIS — C50411 Malignant neoplasm of upper-outer quadrant of right female breast: Secondary | ICD-10-CM

## 2021-04-02 MED ORDER — ONDANSETRON HCL 8 MG PO TABS
8.0000 mg | ORAL_TABLET | Freq: Two times a day (BID) | ORAL | 1 refills | Status: DC | PRN
  Filled 2021-04-02: qty 30, 15d supply, fill #0

## 2021-04-10 DIAGNOSIS — H524 Presbyopia: Secondary | ICD-10-CM | POA: Diagnosis not present

## 2021-04-14 ENCOUNTER — Other Ambulatory Visit (INDEPENDENT_AMBULATORY_CARE_PROVIDER_SITE_OTHER): Payer: 59

## 2021-04-14 ENCOUNTER — Other Ambulatory Visit: Payer: Self-pay

## 2021-04-14 DIAGNOSIS — Z0000 Encounter for general adult medical examination without abnormal findings: Secondary | ICD-10-CM | POA: Diagnosis not present

## 2021-04-14 LAB — LIPID PANEL
Cholesterol: 216 mg/dL — ABNORMAL HIGH (ref 0–200)
HDL: 46.4 mg/dL (ref >39.00)
LDL Cholesterol: 142 mg/dL — ABNORMAL HIGH (ref 0–99)
NonHDL: 169.63
Total CHOL/HDL Ratio: 5
Triglycerides: 136 mg/dL (ref 0.0–149.0)
VLDL: 27.2 mg/dL (ref 0.0–40.0)

## 2021-04-15 ENCOUNTER — Other Ambulatory Visit: Payer: Self-pay

## 2021-04-15 LAB — HEMOGLOBIN A1C: Hgb A1c MFr Bld: 5.2 % (ref 4.6–6.5)

## 2021-04-16 ENCOUNTER — Other Ambulatory Visit: Payer: Self-pay

## 2021-04-16 ENCOUNTER — Encounter: Payer: Self-pay | Admitting: Family Medicine

## 2021-04-16 ENCOUNTER — Ambulatory Visit (INDEPENDENT_AMBULATORY_CARE_PROVIDER_SITE_OTHER): Payer: 59 | Admitting: Family Medicine

## 2021-04-16 VITALS — BP 116/76 | HR 81 | Temp 98.2°F | Resp 16 | Ht 59.0 in | Wt 139.0 lb

## 2021-04-16 DIAGNOSIS — Z0000 Encounter for general adult medical examination without abnormal findings: Secondary | ICD-10-CM

## 2021-04-16 DIAGNOSIS — Z13220 Encounter for screening for lipoid disorders: Secondary | ICD-10-CM | POA: Diagnosis not present

## 2021-04-16 MED ORDER — ALBUTEROL SULFATE HFA 108 (90 BASE) MCG/ACT IN AERS
INHALATION_SPRAY | RESPIRATORY_TRACT | 0 refills | Status: DC
Start: 2021-04-16 — End: 2021-12-05
  Filled 2021-04-16: qty 18, 25d supply, fill #0

## 2021-04-16 NOTE — Patient Instructions (Signed)
Nice to see you!   

## 2021-04-16 NOTE — Assessment & Plan Note (Signed)
Physical exam completed.  There are no diet restrictions at this time given her current breast cancer treatment.  When she has completed her treatment she will get back to exercising once released by her cancer team.  Other cancer screening is up-to-date.  She deferred the typical prophylaxis for COVID given that she has been staying home for the most part and feels as though she is low risk for exposures.  She defers any shingles vaccines until after she has completed treatment.  Lab work reviewed with the patient.

## 2021-04-16 NOTE — Progress Notes (Signed)
Tommi Rumps, MD Phone: [phone]  Katharina Caper Amanda Skinner is a 61 y.o. female who presents today for CPE.  Diet: eating whatever she wants given her current breast cancer treatment Exercise: not exercising given breast cancer treatment and other life events Pap smear: 08/27/20 NILM neg HPV Colonoscopy: 08/21/13, 10 year recall Mammogram: UTD, currently being treated for breast cancer Family history-  Colon cancer: no  Breast cancer: no  Ovarian cancer: no Menses: postmenopausal Vaccines-   Flu: UTD  Tetanus: UTD  Shingles: due  COVID19: x3 HIV screening: UTD Hep C Screening: UTD Tobacco use: no Alcohol use: no Illicit Drug use: no Dentist: yes Ophthalmology: yes  The patient is currently undergoing treatment for breast cancer.  She gets her last cycle of Keytruda and Udenyca later this week.  She then has follow-up with surgery at [facility] to plan for surgical intervention.  This will be followed by likely more Keytruda and radiation.  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   Fibromyalgia 06/05/2015   GERD (gastroesophageal reflux disease) 01/01/2016   Routine general medical examination at a health care facility 04/03/2016   Postmenopause atrophic vaginitis 08/03/2016   Endometrial polyp 06/15/2017   Joint pain 08/31/2018   TMJ (dislocation of temporomandibular joint) 08/31/2018   Bilateral flank pain 10/10/2019   Dysuria 10/10/2019   History of UTI 10/23/2019    Left flank pain 10/23/2019   Aortic atherosclerosis ([place]) 10/23/2019   Generalized osteoarthritis of hand 10/20/2018   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   Greater trochanteric bursitis of right hip 10/03/2018   Invasive carcinoma of breast ([place]) 10/27/2020   Goals of care, counseling/discussion 10/27/2020   Malignant neoplasm of upper-outer quadrant of right breast in female, estrogen receptor positive ([place]) 11/03/2020   Family history of skin cancer 11/27/2020   Genetic testing 12/12/2020   Mutation in [place] gene 12/12/2020   Resolved Ambulatory Problems    Diagnosis Date Noted   Unstable angina ([place]) 01/21/2015   Ringworm 06/05/2015   Postmenopausal bleeding 06/15/2017   Tick bite of abdomen 08/31/2018   Past Medical History:  Diagnosis Date   Asthma 2001   Atypical mole 07/30/2006   Breast cancer ([place]) 09/2020   Chronic sinusitis    Complication of anesthesia    Depression    Dysplastic nevus 07/30/2006   PONV (postoperative nausea and vomiting)    Seasonal allergies    Ulcer     Family History  Problem Relation Age of Onset   Heart attack Mother    Heart disease Mother        died from aortic dissection during CABG surgery   Hypertension Mother    Stroke Mother    Skin cancer Sister        basal cell on face   Heart disease Maternal Uncle    Heart disease Maternal Grandmother    Brain cancer Daughter    Breast cancer Neg Hx     Social History   Socioeconomic History   Marital  status: Married    Spouse name: Nassar   Number of children: 2   Years of education: Not on file   Highest education level: Not on file  Occupational History   Occupation: Equities trader  Tobacco Use   Smoking status: Never   Smokeless tobacco: Never  Vaping Use   Vaping Use: Never used  Substance and Sexual Activity   Alcohol use: Not Currently   Drug use: No   Sexual activity: Yes    Birth control/protection: Post-menopausal  Other Topics Concern   Not on file   Social History Narrative   Not on file   Social Determinants of Health   Financial Resource Strain: Not on file  Food Insecurity: Not on file  Transportation Needs: Not on file  Physical Activity: Not on file  Stress: Not on file  Social Connections: Not on file  Intimate Partner Violence: Not on file    ROS  General:  Negative for nexplained weight loss, fever Skin: Negative for new or changing mole, sore that won't heal HEENT: Negative for trouble hearing, trouble seeing, ringing in ears, mouth sores, hoarseness, change in voice, dysphagia. CV:  Negative for chest pain, dyspnea, edema, palpitations Resp: Negative for cough, dyspnea, hemoptysis GI: Positive for nausea, negative for vomiting, diarrhea, constipation, abdominal pain, melena, hematochezia. GU: Negative for dysuria, incontinence, urinary hesitance, hematuria, vaginal or penile discharge, polyuria, sexual difficulty, lumps in testicle or breasts MSK: Negative for muscle cramps or aches, joint pain or swelling Neuro: Negative for headaches, weakness, numbness, dizziness, passing out/fainting Psych: Negative for depression, anxiety, memory problems  Objective  Physical Exam Vitals:   04/16/21 1008  BP: 116/76  Pulse: 81  Resp: 16  Temp: 98.2 F (36.8 C)  SpO2: 96%    BP Readings from Last 3 Encounters:  04/16/21 116/76  03/28/21 103/68  03/28/21 112/69   Wt Readings from Last 3 Encounters:  04/16/21 139 lb (63 kg)  03/28/21 138 lb (62.6 kg)  03/07/21 137 lb (62.1 kg)    Physical Exam Constitutional:      General: She is not in acute distress.    Appearance: She is not diaphoretic.  HENT:     Head: Normocephalic and atraumatic.  Cardiovascular:     Rate and Rhythm: Normal rate and regular rhythm.     Heart sounds: Normal heart sounds.  Pulmonary:     Effort: Pulmonary effort is normal.     Breath sounds: Normal breath sounds.  Abdominal:     General: Bowel sounds are normal. There is no  distension.     Palpations: Abdomen is soft.     Tenderness: There is no abdominal tenderness. There is no guarding or rebound.  Musculoskeletal:     Right lower leg: No edema.     Left lower leg: No edema.  Lymphadenopathy:     Cervical: No cervical adenopathy.  Skin:    General: Skin is warm and dry.  Neurological:     Mental Status: She is alert.  Psychiatric:        Mood and Affect: Mood normal.     Assessment/Plan:   Problem List Items Addressed This Visit     Routine general medical examination at a health care facility    Physical exam completed.  There are no diet restrictions at this time given her current breast cancer treatment.  When she has completed her treatment she will get back to exercising once released by her cancer team.  Other cancer screening is  up-to-date.  She deferred the typical prophylaxis for COVID given that she has been staying home for the most part and feels as though she is low risk for exposures.  She defers any shingles vaccines until after she has completed treatment.  Lab work reviewed with the patient.      Other Visit Diagnoses     Lipid screening    -  Primary       Return in about 1 year (around 04/16/2022) for CPE, sooner if needed.  This visit occurred during the SARS-CoV-2 public health emergency.  Safety protocols were in place, including screening questions prior to the visit, additional usage of staff PPE, and extensive cleaning of exam room while observing appropriate contact time as indicated for disinfecting solutions.    Tommi Rumps, MD [place]

## 2021-04-18 ENCOUNTER — Other Ambulatory Visit: Payer: Self-pay

## 2021-04-18 ENCOUNTER — Encounter: Payer: Self-pay | Admitting: Oncology

## 2021-04-18 ENCOUNTER — Inpatient Hospital Stay: Payer: 59

## 2021-04-18 ENCOUNTER — Inpatient Hospital Stay (HOSPITAL_BASED_OUTPATIENT_CLINIC_OR_DEPARTMENT_OTHER): Payer: 59 | Admitting: Oncology

## 2021-04-18 VITALS — BP 118/77 | HR 77 | Temp 98.7°F | Resp 20 | Wt 140.2 lb

## 2021-04-18 DIAGNOSIS — Z5111 Encounter for antineoplastic chemotherapy: Secondary | ICD-10-CM

## 2021-04-18 DIAGNOSIS — C50411 Malignant neoplasm of upper-outer quadrant of right female breast: Secondary | ICD-10-CM

## 2021-04-18 DIAGNOSIS — Z170 Estrogen receptor positive status [ER+]: Secondary | ICD-10-CM | POA: Diagnosis not present

## 2021-04-18 DIAGNOSIS — Z7189 Other specified counseling: Secondary | ICD-10-CM | POA: Diagnosis not present

## 2021-04-18 DIAGNOSIS — Z5112 Encounter for antineoplastic immunotherapy: Secondary | ICD-10-CM | POA: Diagnosis not present

## 2021-04-18 DIAGNOSIS — Z5189 Encounter for other specified aftercare: Secondary | ICD-10-CM | POA: Diagnosis not present

## 2021-04-18 LAB — CBC WITH DIFFERENTIAL/PLATELET
Abs Immature Granulocytes: 0.02 10*3/uL (ref 0.00–0.07)
Basophils Absolute: 0 10*3/uL (ref 0.0–0.1)
Basophils Relative: 1 %
Eosinophils Absolute: 0 10*3/uL (ref 0.0–0.5)
Eosinophils Relative: 0 %
HCT: 33.1 % — ABNORMAL LOW (ref 36.0–46.0)
Hemoglobin: 11 g/dL — ABNORMAL LOW (ref 12.0–15.0)
Immature Granulocytes: 0 %
Lymphocytes Relative: 13 %
Lymphs Abs: 0.9 10*3/uL (ref 0.7–4.0)
MCH: 32.5 pg (ref 26.0–34.0)
MCHC: 33.2 g/dL (ref 30.0–36.0)
MCV: 97.9 fL (ref 80.0–100.0)
Monocytes Absolute: 0.7 10*3/uL (ref 0.1–1.0)
Monocytes Relative: 11 %
Neutro Abs: 5 10*3/uL (ref 1.7–7.7)
Neutrophils Relative %: 75 %
Platelets: 304 10*3/uL (ref 150–400)
RBC: 3.38 MIL/uL — ABNORMAL LOW (ref 3.87–5.11)
RDW: 15.3 % (ref 11.5–15.5)
WBC: 6.7 10*3/uL (ref 4.0–10.5)
nRBC: 0 % (ref 0.0–0.2)

## 2021-04-18 LAB — COMPREHENSIVE METABOLIC PANEL
ALT: 28 U/L (ref 0–44)
AST: 27 U/L (ref 15–41)
Albumin: 4 g/dL (ref 3.5–5.0)
Alkaline Phosphatase: 82 U/L (ref 38–126)
Anion gap: 7 (ref 5–15)
BUN: 10 mg/dL (ref 8–23)
CO2: 27 mmol/L (ref 22–32)
Calcium: 9.2 mg/dL (ref 8.9–10.3)
Chloride: 101 mmol/L (ref 98–111)
Creatinine, Ser: 0.71 mg/dL (ref 0.44–1.00)
GFR, Estimated: >60 mL/min (ref >60)
Glucose, Bld: 94 mg/dL (ref 70–99)
Potassium: 3.8 mmol/L (ref 3.5–5.1)
Sodium: 135 mmol/L (ref 135–145)
Total Bilirubin: 0.3 mg/dL (ref 0.3–1.2)
Total Protein: 7 g/dL (ref 6.5–8.1)

## 2021-04-18 LAB — TSH: TSH: 2.634 u[IU]/mL (ref 0.350–4.500)

## 2021-04-18 MED ORDER — HEPARIN SOD (PORK) LOCK FLUSH 100 UNIT/ML IV SOLN
500.0000 [IU] | Freq: Once | INTRAVENOUS | Status: AC | PRN
  Administered 2021-04-18: 500 [IU]
  Filled 2021-04-18: qty 5

## 2021-04-18 MED ORDER — SODIUM CHLORIDE 0.9 % IV SOLN
Freq: Once | INTRAVENOUS | Status: AC
  Filled 2021-04-18: qty 250

## 2021-04-18 MED ORDER — HEPARIN SOD (PORK) LOCK FLUSH 100 UNIT/ML IV SOLN
INTRAVENOUS | Status: AC
  Filled 2021-04-18: qty 5

## 2021-04-18 MED ORDER — SODIUM CHLORIDE 0.9 % IV SOLN
633.0000 mg/m2 | Freq: Once | INTRAVENOUS | Status: AC
  Administered 2021-04-18: 1000 mg via INTRAVENOUS
  Filled 2021-04-18: qty 50

## 2021-04-18 MED ORDER — PALONOSETRON HCL INJECTION 0.25 MG/5ML
0.2500 mg | Freq: Once | INTRAVENOUS | Status: AC
  Administered 2021-04-18: 0.25 mg via INTRAVENOUS
  Filled 2021-04-18: qty 5

## 2021-04-18 MED ORDER — SODIUM CHLORIDE 0.9 % IV SOLN
10.0000 mg | Freq: Once | INTRAVENOUS | Status: AC
  Administered 2021-04-18: 10 mg via INTRAVENOUS
  Filled 2021-04-18: qty 10

## 2021-04-18 MED ORDER — DOXORUBICIN HCL CHEMO IV INJECTION 2 MG/ML
63.0000 mg/m2 | Freq: Once | INTRAVENOUS | Status: AC
  Administered 2021-04-18: 100 mg via INTRAVENOUS
  Filled 2021-04-18: qty 50

## 2021-04-18 MED ORDER — SODIUM CHLORIDE 0.9 % IV SOLN
200.0000 mg | Freq: Once | INTRAVENOUS | Status: AC
  Administered 2021-04-18: 200 mg via INTRAVENOUS
  Filled 2021-04-18: qty 200

## 2021-04-18 MED ORDER — SODIUM CHLORIDE 0.9 % IV SOLN
150.0000 mg | Freq: Once | INTRAVENOUS | Status: AC
  Administered 2021-04-18: 150 mg via INTRAVENOUS
  Filled 2021-04-18: qty 150

## 2021-04-18 MED ORDER — SODIUM CHLORIDE 0.9% FLUSH
10.0000 mL | INTRAVENOUS | Status: DC | PRN
  Filled 2021-04-18: qty 10

## 2021-04-18 NOTE — Progress Notes (Signed)
Patient states left ankle has been giving her some pain. Patient denies any falls,or hitting her ankle.

## 2021-04-18 NOTE — Patient Instructions (Signed)
[facility] [place]  Discharge Instructions: Thank you for choosing [facility] to provide your oncology and hematology care.  If you have a lab appointment with the [facility], please go directly to the [facility] and check in at the registration area.  Wear comfortable clothing and clothing appropriate for easy access to any Portacath or PICC line.   We strive to give you quality time with your provider. You may need to reschedule your appointment if you arrive late (15 or more minutes).  Arriving late affects you and other patients whose appointments are after yours.  Also, if you miss three or more appointments without notifying the office, you may be dismissed from the clinic at the providers discretion.      For prescription refill requests, have your pharmacy contact our office and allow 72 hours for refills to be completed.    Today you received the following chemotherapy and/or immunotherapy agents - pembrolizumab, doxorubicin, cyclophosphamide      To help prevent nausea and vomiting after your treatment, we encourage you to take your nausea medication as directed.  BELOW ARE SYMPTOMS THAT SHOULD BE REPORTED IMMEDIATELY: *FEVER GREATER THAN 100.4 F (38 C) OR HIGHER *CHILLS OR SWEATING *NAUSEA AND VOMITING THAT IS NOT CONTROLLED WITH YOUR NAUSEA MEDICATION *UNUSUAL SHORTNESS OF BREATH *UNUSUAL BRUISING OR BLEEDING *URINARY PROBLEMS (pain or burning when urinating, or frequent urination) *BOWEL PROBLEMS (unusual diarrhea, constipation, pain near the anus) TENDERNESS IN MOUTH AND THROAT WITH OR WITHOUT PRESENCE OF ULCERS (sore throat, sores in mouth, or a toothache) UNUSUAL RASH, SWELLING OR PAIN  UNUSUAL VAGINAL DISCHARGE OR ITCHING   Items with * indicate a potential emergency and should be followed up as soon as possible or go to the Emergency Department if any problems should occur.  Please show the CHEMOTHERAPY ALERT CARD or  IMMUNOTHERAPY ALERT CARD at check-in to the Emergency Department and triage nurse.  Should you have questions after your visit or need to cancel or reschedule your appointment, please contact [facility] [facility] [place]  [phone] and follow the prompts.  Office hours are 8:00 a.m. to 4:30 p.m. Monday - Friday. Please note that voicemails left after 4:00 p.m. may not be returned until the following business day.  We are closed weekends and major holidays. You have access to a nurse at all times for urgent questions. Please call the main number to the clinic [phone] and follow the prompts.  For any non-urgent questions, you may also contact your provider using MyChart. We now offer e-Visits for anyone 59 and older to request care online for non-urgent symptoms. For details visit [email or web address].   Also download the MyChart app! Go to the app store, search "MyChart", open the app, select [facility], and log in with your MyChart username and password.  Due to Covid, a mask is required upon entering the hospital/clinic. If you do not have a mask, one will be given to you upon arrival. For doctor visits, patients may have 1 support person aged 25 or older with them. For treatment visits, patients cannot have anyone with them due to current Covid guidelines and our immunocompromised population.   Pembrolizumab injection What is this medication? PEMBROLIZUMAB (pem broe liz ue mab) is a monoclonal antibody. It is used to treat certain types of cancer. This medicine may be used for other purposes; ask your health care provider or pharmacist if you have questions. COMMON BRAND NAME(S): Keytruda What should I tell  my care team before I take this medication? They need to know if you have any of these conditions: autoimmune diseases like Crohn's disease, ulcerative colitis, or lupus have had or planning to have an allogeneic stem cell transplant (uses someone else's stem  cells) history of organ transplant history of chest radiation nervous system problems like myasthenia gravis or Guillain-Barre syndrome an unusual or allergic reaction to pembrolizumab, other medicines, foods, dyes, or preservatives pregnant or trying to get pregnant breast-feeding How should I use this medication? This medicine is for infusion into a vein. It is given by a health care professional in a hospital or clinic setting. A special MedGuide will be given to you before each treatment. Be sure to read this information carefully each time. Talk to your pediatrician regarding the use of this medicine in children. While this drug may be prescribed for children as young as 6 months for selected conditions, precautions do apply. Overdosage: If you think you have taken too much of this medicine contact a poison control center or emergency room at once. NOTE: This medicine is only for you. Do not share this medicine with others. What if I miss a dose? It is important not to miss your dose. Call your doctor or health care professional if you are unable to keep an appointment. What may interact with this medication? Interactions have not been studied. This list may not describe all possible interactions. Give your health care provider a list of all the medicines, herbs, non-prescription drugs, or dietary supplements you use. Also tell them if you smoke, drink alcohol, or use illegal drugs. Some items may interact with your medicine. What should I watch for while using this medication? Your condition will be monitored carefully while you are receiving this medicine. You may need blood work done while you are taking this medicine. Do not become pregnant while taking this medicine or for 4 months after stopping it. Women should inform their doctor if they wish to become pregnant or think they might be pregnant. There is a potential for serious side effects to an unborn child. Talk to your health care  professional or pharmacist for more information. Do not breast-feed an infant while taking this medicine or for 4 months after the last dose. What side effects may I notice from receiving this medication? Side effects that you should report to your doctor or health care professional as soon as possible: allergic reactions like skin rash, itching or hives, swelling of the face, lips, or tongue bloody or black, tarry breathing problems changes in vision chest pain chills confusion constipation cough diarrhea dizziness or feeling faint or lightheaded fast or irregular heartbeat fever flushing joint pain low blood counts - this medicine may decrease the number of white blood cells, red blood cells and platelets. You may be at increased risk for infections and bleeding. muscle pain muscle weakness pain, tingling, numbness in the hands or feet persistent headache redness, blistering, peeling or loosening of the skin, including inside the mouth signs and symptoms of high blood sugar such as dizziness; dry mouth; dry skin; fruity breath; nausea; stomach pain; increased hunger or thirst; increased urination signs and symptoms of kidney injury like trouble passing urine or change in the amount of urine signs and symptoms of liver injury like dark urine, light-colored stools, loss of appetite, nausea, right upper belly pain, yellowing of the eyes or skin sweating swollen lymph nodes weight loss Side effects that usually do not require medical attention (report to  your doctor or health care professional if they continue or are bothersome): decreased appetite hair loss tiredness This list may not describe all possible side effects. Call your doctor for medical advice about side effects. You may report side effects to FDA at 1-800-FDA-1088. Where should I keep my medication? This drug is given in a hospital or clinic and will not be stored at home. NOTE: This sheet is a summary. It may not  cover all possible information. If you have questions about this medicine, talk to your doctor, pharmacist, or health care provider.  2022 Elsevier/Gold Standard (2020-10-29 00:00:00)   Doxorubicin injection What is this medication? DOXORUBICIN (dox oh ROO bi sin) is a chemotherapy drug. It is used to treat many kinds of cancer like leukemia, lymphoma, neuroblastoma, sarcoma, and Wilms' tumor. It is also used to treat bladder cancer, breast cancer, lung cancer, ovarian cancer, stomach cancer, and thyroid cancer. This medicine may be used for other purposes; ask your health care provider or pharmacist if you have questions. COMMON BRAND NAME(S): Adriamycin, Adriamycin PFS, Adriamycin RDF, Rubex What should I tell my care team before I take this medication? They need to know if you have any of these conditions: heart disease history of low blood counts caused by a medicine liver disease recent or ongoing radiation therapy an unusual or allergic reaction to doxorubicin, other chemotherapy agents, other medicines, foods, dyes, or preservatives pregnant or trying to get pregnant breast-feeding How should I use this medication? This drug is given as an infusion into a vein. It is administered in a hospital or clinic by a specially trained health care professional. If you have pain, swelling, burning or any unusual feeling around the site of your injection, tell your health care professional right away. Talk to your pediatrician regarding the use of this medicine in children. Special care may be needed. Overdosage: If you think you have taken too much of this medicine contact a poison control center or emergency room at once. NOTE: This medicine is only for you. Do not share this medicine with others. What if I miss a dose? It is important not to miss your dose. Call your doctor or health care professional if you are unable to keep an appointment. What may interact with this medication? This  medicine may interact with the following medications: 6-mercaptopurine paclitaxel phenytoin St. John's Wort trastuzumab verapamil This list may not describe all possible interactions. Give your health care provider a list of all the medicines, herbs, non-prescription drugs, or dietary supplements you use. Also tell them if you smoke, drink alcohol, or use illegal drugs. Some items may interact with your medicine. What should I watch for while using this medication? This drug may make you feel generally unwell. This is not uncommon, as chemotherapy can affect healthy cells as well as cancer cells. Report any side effects. Continue your course of treatment even though you feel ill unless your doctor tells you to stop. There is a maximum amount of this medicine you should receive throughout your life. The amount depends on the medical condition being treated and your overall health. Your doctor will watch how much of this medicine you receive in your lifetime. Tell your doctor if you have taken this medicine before. You may need blood work done while you are taking this medicine. Your urine may turn red for a few days after your dose. This is not blood. If your urine is dark or brown, call your doctor. In some cases, you may  be given additional medicines to help with side effects. Follow all directions for their use. Call your doctor or health care professional for advice if you get a fever, chills or sore throat, or other symptoms of a cold or flu. Do not treat yourself. This drug decreases your body's ability to fight infections. Try to avoid being around people who are sick. This medicine may increase your risk to bruise or bleed. Call your doctor or health care professional if you notice any unusual bleeding. Talk to your doctor about your risk of cancer. You may be more at risk for certain types of cancers if you take this medicine. Do not become pregnant while taking this medicine or for 6 months  after stopping it. Women should inform their doctor if they wish to become pregnant or think they might be pregnant. Men should not father a child while taking this medicine and for 6 months after stopping it. There is a potential for serious side effects to an unborn child. Talk to your health care professional or pharmacist for more information. Do not breast-feed an infant while taking this medicine. This medicine has caused ovarian failure in some women and reduced sperm counts in some men This medicine may interfere with the ability to have a child. Talk with your doctor or health care professional if you are concerned about your fertility. This medicine may cause a decrease in Co-Enzyme Q-10. You should make sure that you get enough Co-Enzyme Q-10 while you are taking this medicine. Discuss the foods you eat and the vitamins you take with your health care professional. What side effects may I notice from receiving this medication? Side effects that you should report to your doctor or health care professional as soon as possible: allergic reactions like skin rash, itching or hives, swelling of the face, lips, or tongue breathing problems chest pain fast or irregular heartbeat low blood counts - this medicine may decrease the number of white blood cells, red blood cells and platelets. You may be at increased risk for infections and bleeding. pain, redness, or irritation at site where injected signs of infection - fever or chills, cough, sore throat, pain or difficulty passing urine signs of decreased platelets or bleeding - bruising, pinpoint red spots on the skin, black, tarry stools, blood in the urine swelling of the ankles, feet, hands tiredness weakness Side effects that usually do not require medical attention (report to your doctor or health care professional if they continue or are bothersome): diarrhea hair loss mouth sores nail discoloration or damage nausea red colored  urine vomiting This list may not describe all possible side effects. Call your doctor for medical advice about side effects. You may report side effects to FDA at 1-800-FDA-1088. Where should I keep my medication? This drug is given in a hospital or clinic and will not be stored at home. NOTE: This sheet is a summary. It may not cover all possible information. If you have questions about this medicine, talk to your doctor, pharmacist, or health care provider.  2022 Elsevier/Gold Standard (2016-10-15 00:00:00)   Cyclophosphamide Injection What is this medication? CYCLOPHOSPHAMIDE (sye kloe FOSS fa mide) is a chemotherapy drug. It slows the growth of cancer cells. This medicine is used to treat many types of cancer like lymphoma, myeloma, leukemia, breast cancer, and ovarian cancer, to name a few. This medicine may be used for other purposes; ask your health care provider or pharmacist if you have questions. COMMON BRAND NAME(S): Cytoxan, Neosar  What should I tell my care team before I take this medication? They need to know if you have any of these conditions: heart disease history of irregular heartbeat infection kidney disease liver disease low blood counts, like white cells, platelets, or red blood cells on hemodialysis recent or ongoing radiation therapy scarring or thickening of the lungs trouble passing urine an unusual or allergic reaction to cyclophosphamide, other medicines, foods, dyes, or preservatives pregnant or trying to get pregnant breast-feeding How should I use this medication? This drug is usually given as an injection into a vein or muscle or by infusion into a vein. It is administered in a hospital or clinic by a specially trained health care professional. Talk to your pediatrician regarding the use of this medicine in children. Special care may be needed. Overdosage: If you think you have taken too much of this medicine contact a poison control center or  emergency room at once. NOTE: This medicine is only for you. Do not share this medicine with others. What if I miss a dose? It is important not to miss your dose. Call your doctor or health care professional if you are unable to keep an appointment. What may interact with this medication? amphotericin B azathioprine certain antivirals for HIV or hepatitis certain medicines for blood pressure, heart disease, irregular heart beat certain medicines that treat or prevent blood clots like warfarin certain other medicines for cancer cyclosporine etanercept indomethacin medicines that relax muscles for surgery medicines to increase blood counts metronidazole This list may not describe all possible interactions. Give your health care provider a list of all the medicines, herbs, non-prescription drugs, or dietary supplements you use. Also tell them if you smoke, drink alcohol, or use illegal drugs. Some items may interact with your medicine. What should I watch for while using this medication? Your condition will be monitored carefully while you are receiving this medicine. You may need blood work done while you are taking this medicine. Drink water or other fluids as directed. Urinate often, even at night. Some products may contain alcohol. Ask your health care professional if this medicine contains alcohol. Be sure to tell all health care professionals you are taking this medicine. Certain medicines, like metronidazole and disulfiram, can cause an unpleasant reaction when taken with alcohol. The reaction includes flushing, headache, nausea, vomiting, sweating, and increased thirst. The reaction can last from 30 minutes to several hours. Do not become pregnant while taking this medicine or for 1 year after stopping it. Women should inform their health care professional if they wish to become pregnant or think they might be pregnant. Men should not father a child while taking this medicine and for 4  months after stopping it. There is potential for serious side effects to an unborn child. Talk to your health care professional for more information. Do not breast-feed an infant while taking this medicine or for 1 week after stopping it. This medicine has caused ovarian failure in some women. This medicine may make it more difficult to get pregnant. Talk to your health care professional if you are concerned about your fertility. This medicine has caused decreased sperm counts in some men. This may make it more difficult to father a child. Talk to your health care professional if you are concerned about your fertility. Call your health care professional for advice if you get a fever, chills, or sore throat, or other symptoms of a cold or flu. Do not treat yourself. This medicine decreases your body's  ability to fight infections. Try to avoid being around people who are sick. Avoid taking medicines that contain aspirin, acetaminophen, ibuprofen, naproxen, or ketoprofen unless instructed by your health care professional. These medicines may hide a fever. Talk to your health care professional about your risk of cancer. You may be more at risk for certain types of cancer if you take this medicine. If you are going to need surgery or other procedure, tell your health care professional that you are using this medicine. Be careful brushing or flossing your teeth or using a toothpick because you may get an infection or bleed more easily. If you have any dental work done, tell your dentist you are receiving this medicine. What side effects may I notice from receiving this medication? Side effects that you should report to your doctor or health care professional as soon as possible: allergic reactions like skin rash, itching or hives, swelling of the face, lips, or tongue breathing problems nausea, vomiting signs and symptoms of bleeding such as bloody or black, tarry stools; red or dark brown urine; spitting up  blood or brown material that looks like coffee grounds; red spots on the skin; unusual bruising or bleeding from the eyes, gums, or nose signs and symptoms of heart failure like fast, irregular heartbeat, sudden weight gain; swelling of the ankles, feet, hands signs and symptoms of infection like fever; chills; cough; sore throat; pain or trouble passing urine signs and symptoms of kidney injury like trouble passing urine or change in the amount of urine signs and symptoms of liver injury like dark yellow or brown urine; general ill feeling or flu-like symptoms; light-colored stools; loss of appetite; nausea; right upper belly pain; unusually weak or tired; yellowing of the eyes or skin Side effects that usually do not require medical attention (report to your doctor or health care professional if they continue or are bothersome): confusion decreased hearing diarrhea facial flushing hair loss headache loss of appetite missed menstrual periods signs and symptoms of low red blood cells or anemia such as unusually weak or tired; feeling faint or lightheaded; falls skin discoloration This list may not describe all possible side effects. Call your doctor for medical advice about side effects. You may report side effects to FDA at 1-800-FDA-1088. Where should I keep my medication? This drug is given in a hospital or clinic and will not be stored at home. NOTE: This sheet is a summary. It may not cover all possible information. If you have questions about this medicine, talk to your doctor, pharmacist, or health care provider.  2022 Elsevier/Gold Standard (2020-10-29 00:00:00)
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
## 2021-04-21 ENCOUNTER — Other Ambulatory Visit: Payer: Self-pay

## 2021-04-21 ENCOUNTER — Inpatient Hospital Stay: Payer: 59

## 2021-04-21 DIAGNOSIS — C50411 Malignant neoplasm of upper-outer quadrant of right female breast: Secondary | ICD-10-CM | POA: Diagnosis not present

## 2021-04-21 DIAGNOSIS — Z5189 Encounter for other specified aftercare: Secondary | ICD-10-CM | POA: Diagnosis not present

## 2021-04-21 DIAGNOSIS — Z5111 Encounter for antineoplastic chemotherapy: Secondary | ICD-10-CM | POA: Diagnosis not present

## 2021-04-21 DIAGNOSIS — Z170 Estrogen receptor positive status [ER+]: Secondary | ICD-10-CM

## 2021-04-21 DIAGNOSIS — Z5112 Encounter for antineoplastic immunotherapy: Secondary | ICD-10-CM | POA: Diagnosis not present

## 2021-04-21 MED ORDER — PEGFILGRASTIM-BMEZ 6 MG/0.6ML ~~LOC~~ SOSY
6.0000 mg | PREFILLED_SYRINGE | Freq: Once | SUBCUTANEOUS | Status: AC
  Administered 2021-04-21: 6 mg via SUBCUTANEOUS
  Filled 2021-04-21: qty 0.6

## 2021-04-23 DIAGNOSIS — C50911 Malignant neoplasm of unspecified site of right female breast: Secondary | ICD-10-CM | POA: Diagnosis not present

## 2021-04-23 DIAGNOSIS — C773 Secondary and unspecified malignant neoplasm of axilla and upper limb lymph nodes: Secondary | ICD-10-CM | POA: Diagnosis not present

## 2021-04-23 DIAGNOSIS — C50411 Malignant neoplasm of upper-outer quadrant of right female breast: Secondary | ICD-10-CM | POA: Diagnosis not present

## 2021-04-23 DIAGNOSIS — Z170 Estrogen receptor positive status [ER+]: Secondary | ICD-10-CM | POA: Diagnosis not present

## 2021-04-23 DIAGNOSIS — R922 Inconclusive mammogram: Secondary | ICD-10-CM | POA: Diagnosis not present

## 2021-05-01 ENCOUNTER — Encounter: Payer: Self-pay | Admitting: Oncology

## 2021-05-14 ENCOUNTER — Other Ambulatory Visit: Payer: Self-pay

## 2021-05-20 DIAGNOSIS — J45909 Unspecified asthma, uncomplicated: Secondary | ICD-10-CM | POA: Diagnosis not present

## 2021-05-20 DIAGNOSIS — C773 Secondary and unspecified malignant neoplasm of axilla and upper limb lymph nodes: Secondary | ICD-10-CM | POA: Diagnosis not present

## 2021-05-20 DIAGNOSIS — F32A Depression, unspecified: Secondary | ICD-10-CM | POA: Diagnosis not present

## 2021-05-20 DIAGNOSIS — C50911 Malignant neoplasm of unspecified site of right female breast: Secondary | ICD-10-CM | POA: Diagnosis not present

## 2021-05-20 DIAGNOSIS — Z888 Allergy status to other drugs, medicaments and biological substances status: Secondary | ICD-10-CM | POA: Diagnosis not present

## 2021-05-20 DIAGNOSIS — I700 Atherosclerosis of aorta: Secondary | ICD-10-CM | POA: Diagnosis not present

## 2021-05-20 DIAGNOSIS — K219 Gastro-esophageal reflux disease without esophagitis: Secondary | ICD-10-CM | POA: Diagnosis not present

## 2021-05-20 DIAGNOSIS — C50411 Malignant neoplasm of upper-outer quadrant of right female breast: Secondary | ICD-10-CM | POA: Diagnosis not present

## 2021-05-20 DIAGNOSIS — Z170 Estrogen receptor positive status [ER+]: Secondary | ICD-10-CM | POA: Diagnosis not present

## 2021-05-20 DIAGNOSIS — Z9221 Personal history of antineoplastic chemotherapy: Secondary | ICD-10-CM | POA: Diagnosis not present

## 2021-05-23 ENCOUNTER — Other Ambulatory Visit: Payer: 59

## 2021-05-23 ENCOUNTER — Ambulatory Visit: Payer: 59 | Admitting: Oncology

## 2021-05-28 ENCOUNTER — Other Ambulatory Visit: Payer: Self-pay

## 2021-05-28 DIAGNOSIS — Z170 Estrogen receptor positive status [ER+]: Secondary | ICD-10-CM | POA: Diagnosis not present

## 2021-05-28 DIAGNOSIS — C50411 Malignant neoplasm of upper-outer quadrant of right female breast: Secondary | ICD-10-CM | POA: Diagnosis not present

## 2021-05-28 MED ORDER — GABAPENTIN 100 MG PO CAPS
ORAL_CAPSULE | ORAL | 3 refills | Status: AC
  Filled 2021-05-28: qty 270, 90d supply, fill #0
  Filled 2021-08-14: qty 270, 90d supply, fill #1
  Filled 2021-10-30: qty 270, 90d supply, fill #2

## 2021-05-30 ENCOUNTER — Inpatient Hospital Stay (HOSPITAL_BASED_OUTPATIENT_CLINIC_OR_DEPARTMENT_OTHER): Payer: 59 | Admitting: Oncology

## 2021-05-30 ENCOUNTER — Inpatient Hospital Stay: Payer: 59 | Attending: Oncology

## 2021-05-30 ENCOUNTER — Encounter: Payer: Self-pay | Admitting: Oncology

## 2021-05-30 VITALS — BP 109/63 | HR 81 | Temp 98.2°F | Resp 16 | Wt 140.4 lb

## 2021-05-30 DIAGNOSIS — Z170 Estrogen receptor positive status [ER+]: Secondary | ICD-10-CM | POA: Diagnosis not present

## 2021-05-30 DIAGNOSIS — Z7189 Other specified counseling: Secondary | ICD-10-CM

## 2021-05-30 DIAGNOSIS — C50411 Malignant neoplasm of upper-outer quadrant of right female breast: Secondary | ICD-10-CM | POA: Diagnosis not present

## 2021-05-30 DIAGNOSIS — C773 Secondary and unspecified malignant neoplasm of axilla and upper limb lymph nodes: Secondary | ICD-10-CM | POA: Diagnosis not present

## 2021-05-30 DIAGNOSIS — M797 Fibromyalgia: Secondary | ICD-10-CM | POA: Diagnosis not present

## 2021-05-30 DIAGNOSIS — R0789 Other chest pain: Secondary | ICD-10-CM | POA: Insufficient documentation

## 2021-05-30 DIAGNOSIS — Z95828 Presence of other vascular implants and grafts: Secondary | ICD-10-CM

## 2021-05-30 DIAGNOSIS — N952 Postmenopausal atrophic vaginitis: Secondary | ICD-10-CM | POA: Insufficient documentation

## 2021-05-30 DIAGNOSIS — Z780 Asymptomatic menopausal state: Secondary | ICD-10-CM | POA: Insufficient documentation

## 2021-05-30 DIAGNOSIS — Z808 Family history of malignant neoplasm of other organs or systems: Secondary | ICD-10-CM | POA: Diagnosis not present

## 2021-05-30 DIAGNOSIS — Z803 Family history of malignant neoplasm of breast: Secondary | ICD-10-CM | POA: Insufficient documentation

## 2021-05-30 LAB — COMPREHENSIVE METABOLIC PANEL
ALT: 43 U/L (ref 0–44)
AST: 35 U/L (ref 15–41)
Albumin: 4.2 g/dL (ref 3.5–5.0)
Alkaline Phosphatase: 92 U/L (ref 38–126)
Anion gap: 6 (ref 5–15)
BUN: 13 mg/dL (ref 8–23)
CO2: 27 mmol/L (ref 22–32)
Calcium: 9.3 mg/dL (ref 8.9–10.3)
Chloride: 103 mmol/L (ref 98–111)
Creatinine, Ser: 0.68 mg/dL (ref 0.44–1.00)
GFR, Estimated: >60 mL/min (ref >60)
Glucose, Bld: 69 mg/dL — ABNORMAL LOW (ref 70–99)
Potassium: 3.9 mmol/L (ref 3.5–5.1)
Sodium: 136 mmol/L (ref 135–145)
Total Bilirubin: 0.4 mg/dL (ref 0.3–1.2)
Total Protein: 7.3 g/dL (ref 6.5–8.1)

## 2021-05-30 LAB — CBC WITH DIFFERENTIAL/PLATELET
Abs Immature Granulocytes: 0.01 10*3/uL (ref 0.00–0.07)
Basophils Absolute: 0 10*3/uL (ref 0.0–0.1)
Basophils Relative: 1 %
Eosinophils Absolute: 0.2 10*3/uL (ref 0.0–0.5)
Eosinophils Relative: 3 %
HCT: 36.1 % (ref 36.0–46.0)
Hemoglobin: 12.1 g/dL (ref 12.0–15.0)
Immature Granulocytes: 0 %
Lymphocytes Relative: 19 %
Lymphs Abs: 1.1 10*3/uL (ref 0.7–4.0)
MCH: 32.5 pg (ref 26.0–34.0)
MCHC: 33.5 g/dL (ref 30.0–36.0)
MCV: 97 fL (ref 80.0–100.0)
Monocytes Absolute: 0.5 10*3/uL (ref 0.1–1.0)
Monocytes Relative: 8 %
Neutro Abs: 4.1 10*3/uL (ref 1.7–7.7)
Neutrophils Relative %: 69 %
Platelets: 219 10*3/uL (ref 150–400)
RBC: 3.72 MIL/uL — ABNORMAL LOW (ref 3.87–5.11)
RDW: 13 % (ref 11.5–15.5)
WBC: 6 10*3/uL (ref 4.0–10.5)
nRBC: 0 % (ref 0.0–0.2)

## 2021-05-30 MED ORDER — HEPARIN SOD (PORK) LOCK FLUSH 100 UNIT/ML IV SOLN
500.0000 [IU] | Freq: Once | INTRAVENOUS | Status: AC
  Administered 2021-05-30: 500 [IU] via INTRAVENOUS
  Filled 2021-05-30: qty 5

## 2021-05-30 MED ORDER — SODIUM CHLORIDE 0.9% FLUSH
10.0000 mL | Freq: Once | INTRAVENOUS | Status: AC
  Administered 2021-05-30: 10 mL via INTRAVENOUS
  Filled 2021-05-30: qty 10

## 2021-05-30 NOTE — Progress Notes (Signed)
OFF PATHWAY REGIMEN - Breast ? ?No Change  Continue With Treatment as Ordered. ? ?Original Decision Date/Time: 11/03/2020 20:04 ? ? ?[id number]:Carboplatin AUC=5 IV D1 + Paclitaxel 80 mg/m2 IV D1,8,15 + G-CSF q21 Days + Pembrolizumab 400 mg IV D1 q42 Days x 12 Weeks Followed by [facility] + G-CSF q21 Days + Pembrolizumab 400 mg IV D1 q42 Days x 12 Weeks: ?  Pembrolizumab cycles 1 through 4: A cycle is every 42 days: ?    Pembrolizumab  ?  Chemotherapy cycles 1 through 4: A cycle is every 21 days: ?    Paclitaxel  ?    Carboplatin  ?    Filgrastim-xxxx  ?  Chemotherapy cycles 5 through 8: A cycle is every 21 days: ?    Doxorubicin  ?    Cyclophosphamide  ?    Pegfilgrastim-xxxx  ? ?**Always confirm dose/schedule in your pharmacy ordering system** ? ?Patient Characteristics: ?Preoperative or Nonsurgical Candidate (Clinical Staging), Neoadjuvant Therapy followed by Surgery, Invasive Disease, Chemotherapy, HER2 Negative/Unknown/Equivocal, ER Positive ?Therapeutic Status: Preoperative or Nonsurgical Candidate (Clinical Staging) ?AJCC M Category: cM0 ?AJCC Grade: G3 ?Breast Surgical Plan: Neoadjuvant Therapy followed by Surgery ?ER Status: Positive (+) ?AJCC 8 Stage Grouping: IIB ?HER2 Status: Negative (-) ?AJCC T Category: cT1c ?AJCC N Category: cN1 ?PR Status: Negative (-) ?Intent of Therapy: ?Curative Intent, Discussed with Patient ?

## 2021-05-30 NOTE — Progress Notes (Signed)
DISCONTINUE OFF PATHWAY REGIMEN - Breast ? ? ?[id number]:Carboplatin AUC=5 IV D1 + Paclitaxel 80 mg/m2 IV D1,8,15 + G-CSF q21 Days + Pembrolizumab 400 mg IV D1 q42 Days x 12 Weeks Followed by [facility] + G-CSF q21 Days + Pembrolizumab 400 mg IV D1 q42 Days x 12 Weeks: ?  Pembrolizumab cycles 1 through 4: A cycle is every 42 days: ?    Pembrolizumab  ?  Chemotherapy cycles 1 through 4: A cycle is every 21 days: ?    Paclitaxel  ?    Carboplatin  ?    Filgrastim-xxxx  ?  Chemotherapy cycles 5 through 8: A cycle is every 21 days: ?    Doxorubicin  ?    Cyclophosphamide  ?    Pegfilgrastim-xxxx  ? ?**Always confirm dose/schedule in your pharmacy ordering system** ? ?REASON: Other Reason ?PRIOR TREATMENT: Off Pathway: Carboplatin AUC=5 IV D1 + Paclitaxel 80 mg/m2 IV D1,8,15 + G-CSF q21 Days + Pembrolizumab 400 mg IV D1 q42 Days x 12 Weeks Followed by [facility] + G-CSF q21 Days + Pembrolizumab 400 mg IV D1 q42 Days x 12 Weeks ?TREATMENT RESPONSE: Partial Response (PR) ? ?START ON PATHWAY REGIMEN - Breast ? ? ?  A cycle is every 21 days: ?    Capecitabine  ? ?**Always confirm dose/schedule in your pharmacy ordering system** ? ?Patient Characteristics: ?Post-Neoadjuvant Therapy and Resection, HER2 Negative/Unknown/Equivocal, Residual Disease, ER Positive, Adjuvant Chemotherapy/Targeted Therapy, BRCA-WT/Unknown ?Therapeutic Status: Post-Neoadjuvant Therapy and Resection ?Residual Invasive Disease Post-Neoadjuvant Therapy<= Yes ?ER Status: Positive (+) ?HER2 Status: Negative (-) ?PR Status: Negative (-) ?BRCA Mutation Status: BRCA Wild-Type ?Intent of Therapy: ?Curative Intent, Discussed with Patient ?
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
## 2021-06-11 ENCOUNTER — Encounter
Admission: RE | Admit: 2021-06-11 | Discharge: 2021-06-11 | Disposition: A | Discharge status: Home / Self Care | Payer: 59 | Source: Ambulatory Visit | Attending: Oncology | Admitting: Oncology

## 2021-06-11 ENCOUNTER — Ambulatory Visit
Admission: RE | Admit: 2021-06-11 | Discharge: 2021-06-11 | Disposition: A | Discharge status: Home / Self Care | Payer: 59 | Source: Ambulatory Visit | Attending: Oncology | Admitting: Oncology

## 2021-06-11 ENCOUNTER — Other Ambulatory Visit: Payer: Self-pay | Admitting: *Deleted

## 2021-06-11 DIAGNOSIS — R918 Other nonspecific abnormal finding of lung field: Secondary | ICD-10-CM | POA: Diagnosis not present

## 2021-06-11 DIAGNOSIS — C50411 Malignant neoplasm of upper-outer quadrant of right female breast: Secondary | ICD-10-CM | POA: Diagnosis not present

## 2021-06-11 DIAGNOSIS — C50919 Malignant neoplasm of unspecified site of unspecified female breast: Secondary | ICD-10-CM | POA: Diagnosis not present

## 2021-06-11 DIAGNOSIS — K760 Fatty (change of) liver, not elsewhere classified: Secondary | ICD-10-CM | POA: Diagnosis not present

## 2021-06-11 DIAGNOSIS — R590 Localized enlarged lymph nodes: Secondary | ICD-10-CM

## 2021-06-11 DIAGNOSIS — Z170 Estrogen receptor positive status [ER+]: Secondary | ICD-10-CM | POA: Insufficient documentation

## 2021-06-11 DIAGNOSIS — J9811 Atelectasis: Secondary | ICD-10-CM | POA: Diagnosis not present

## 2021-06-11 DIAGNOSIS — R9389 Abnormal findings on diagnostic imaging of other specified body structures: Secondary | ICD-10-CM

## 2021-06-11 DIAGNOSIS — J8410 Pulmonary fibrosis, unspecified: Secondary | ICD-10-CM | POA: Diagnosis not present

## 2021-06-11 IMAGING — CT CT CHEST-ABD-PELV W/ CM
2 of 5 series · 13 of 36 positions shown, 15 images · IV contrast (agent unspecified)
Comparison: [DATE], CT of the abdomen and pelvis.

CLINICAL DATA: A 60-year-old female presents for follow-up of
breast cancer.

* Tracking Code: BO *
EXAM:
CT CHEST, ABDOMEN, AND PELVIS WITH CONTRAST
TECHNIQUE: Multidetector CT imaging of the chest, abdomen and pelvis was
performed following the standard protocol during bolus
administration of intravenous contrast.

[Series 2: cap with · axial · 0.82mm/px · z∈[-608,-98]mm · 10 of 124 slices shown, 12 images]
[im 11/124  mediastinal]
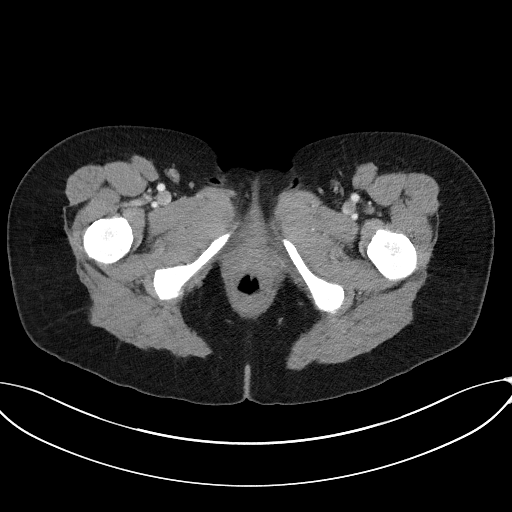
[im 11/124  bone]
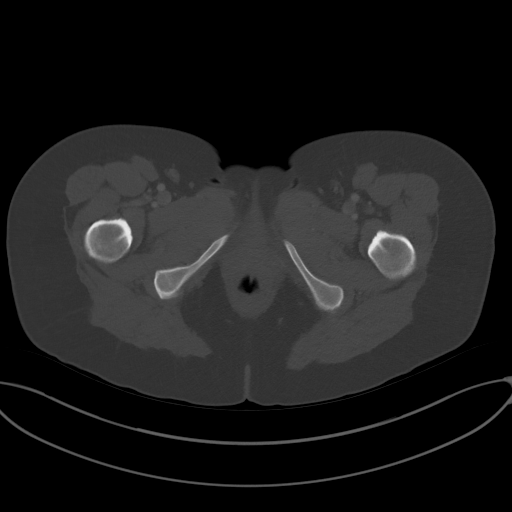
[im 21/124  mediastinal]
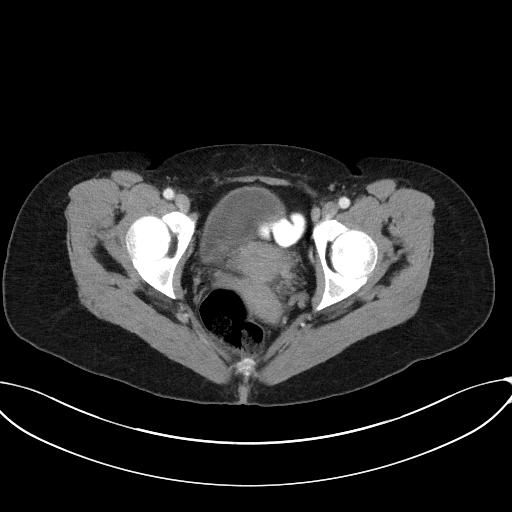
[im 31/124  mediastinal]
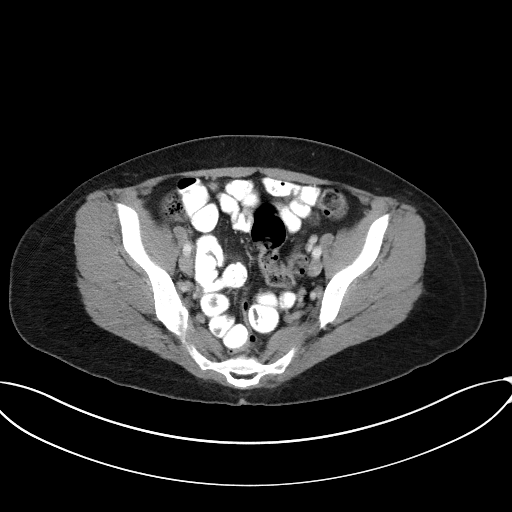
[im 42/124  mediastinal]
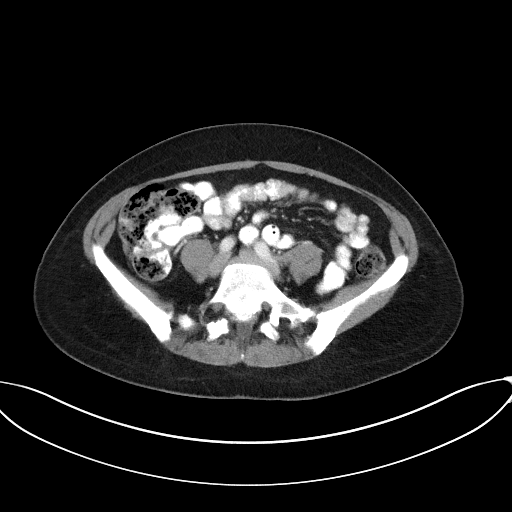
[im 52/124  mediastinal]
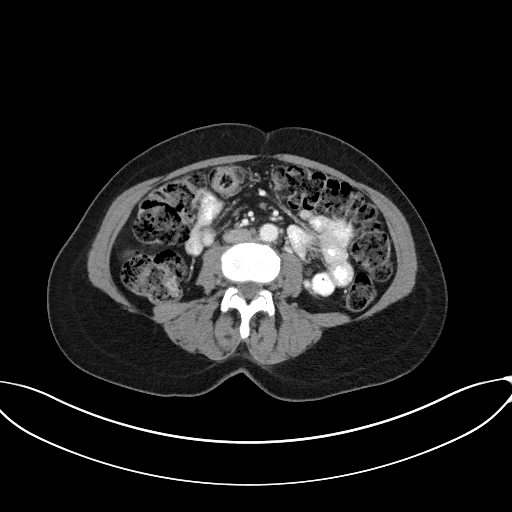
[im 72/124  mediastinal]
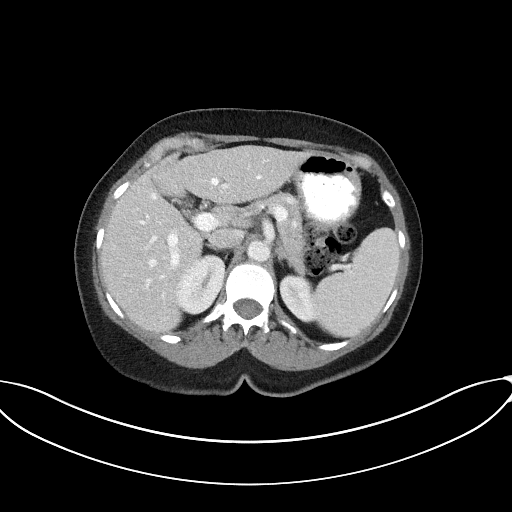
[im 83/124  mediastinal]
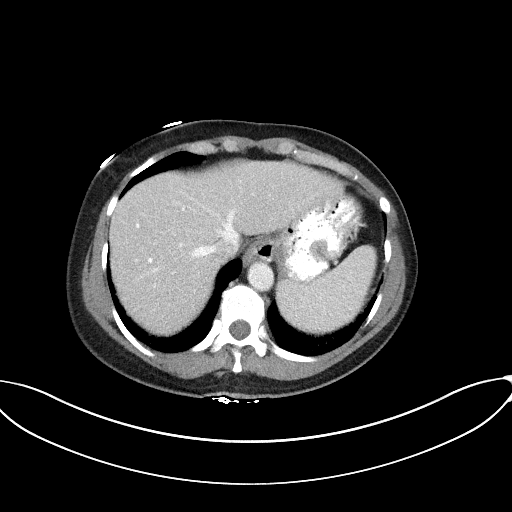
[im 93/124  mediastinal]
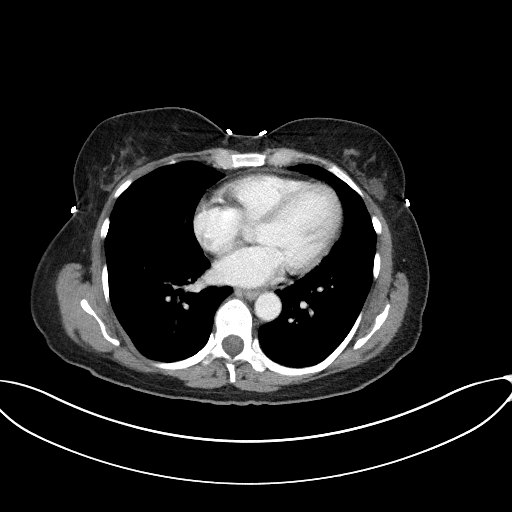
[im 103/124  mediastinal]
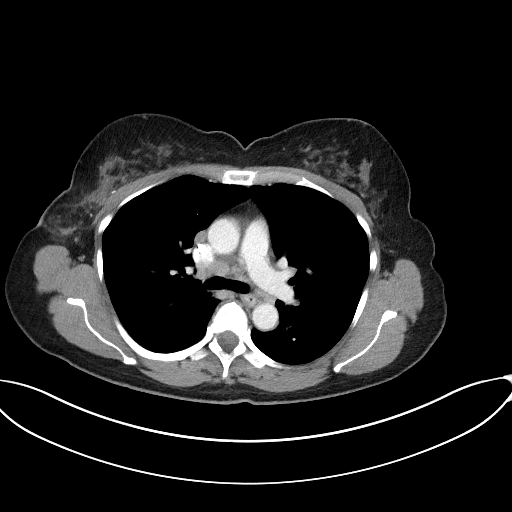
[im 103/124  bone]
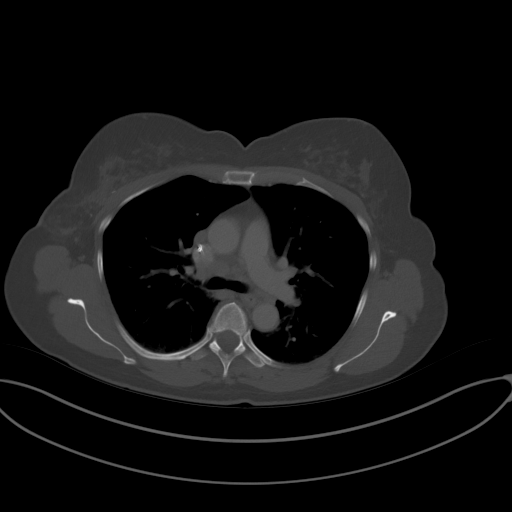
[im 113/124  mediastinal]
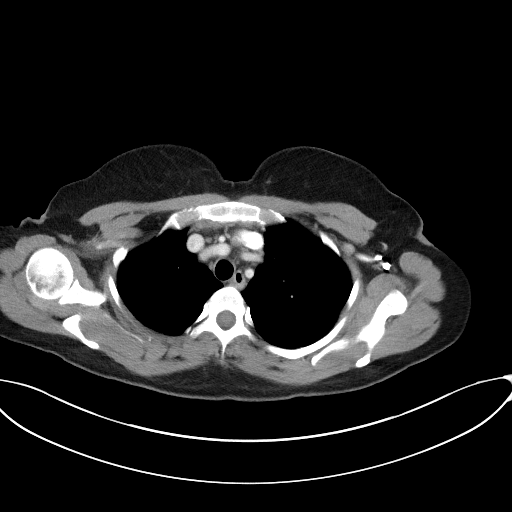

[Series 5: coronals · coronal · 0.73mm/px · 3 of 124 slices shown]
[im 25/124  mediastinal]
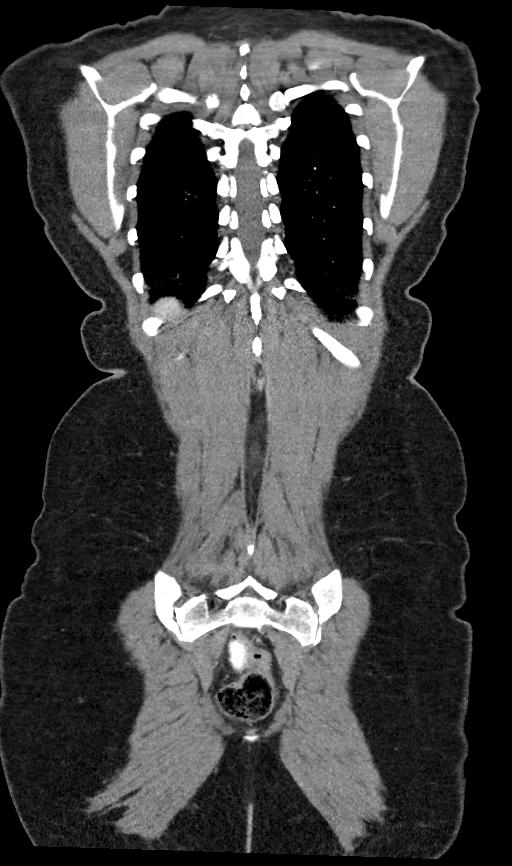
[im 50/124  mediastinal]
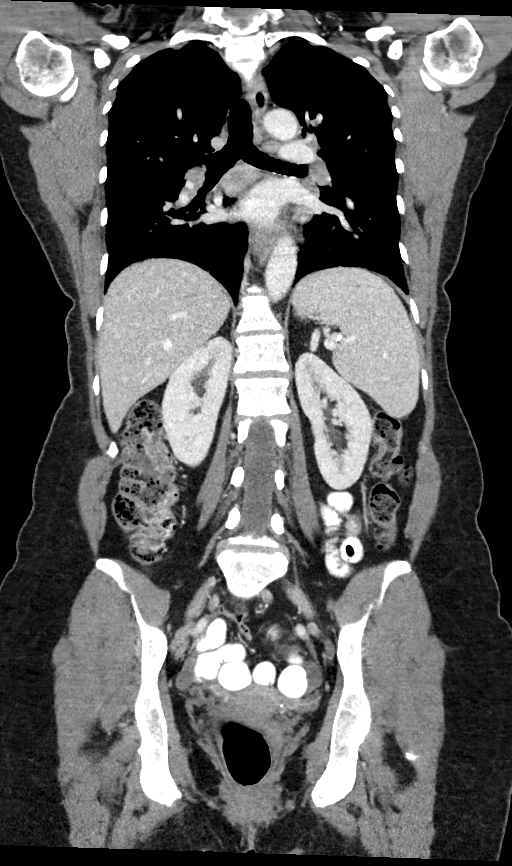
[im 74/124  mediastinal]
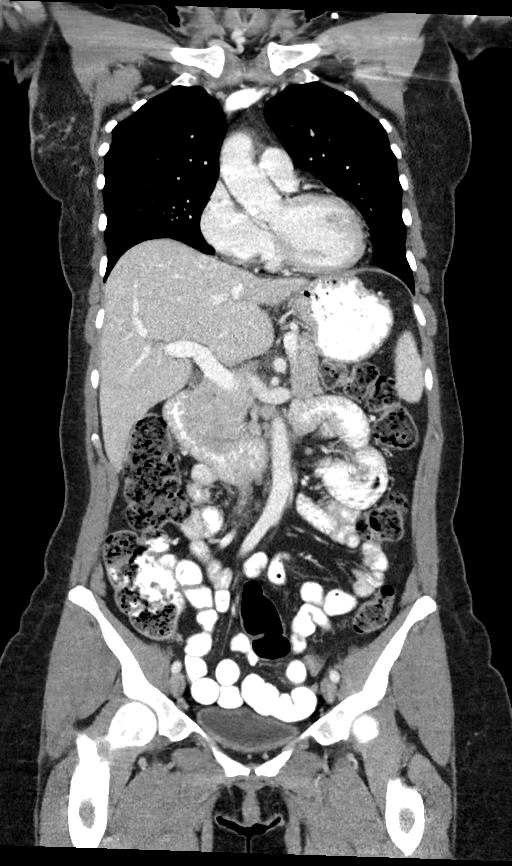

[13 of 36 positions shown; findings below may reference images not displayed]

RADIATION DOSE REDUCTION: This exam was performed according to the
departmental dose-optimization program which includes automated
exposure control, adjustment of the mA and/or kV according to
patient size and/or use of iterative reconstruction technique.

CONTRAST:  100mL OMNIPAQUE IOHEXOL 300 MG/ML  SOLN
FINDINGS: CT CHEST FINDINGS

Cardiovascular: LEFT-sided Port-A-Cath terminates in the area of the
caval to atrial junction. Heart size is normal without pericardial
effusion. Aortic caliber is normal. Central pulmonary vasculature is
normal caliber.

Mediastinum/Nodes: No thoracic inlet adenopathy. RIGHT paratracheal
and RIGHT hilar adenopathy. (Image [DATE]) RIGHT paratracheal lymph
node at 13 mm.

(Image [DATE]) RIGHT hilar lymph node at 15 mm short axis. Scattered
smaller lymph nodes throughout the chest abnormal with respect to
number though not substantially enlarged including an AP window
lymph node at 9 mm and mild fullness of LEFT hilar nodal tissue as
well as subcarinal and LEFT paratracheal nodal tissue.

No axillary lymphadenopathy.  No internal mammary lymphadenopathy.

Lungs/Pleura: No consolidation or pleural effusion. Mild subpleural
reticulation at the lung bases and basilar atelectasis. Grouped
nodules and tree in bud in the LEFT upper lobe, dominant nodule
measuring 5 mm (image 43/4) these are in the posterior LEFT upper
lobe with smaller nodules surrounding this area. Granuloma in the
lingula, densely calcified. Airways are patent. Scattered calcified
granulomata elsewhere in the chest.

Musculoskeletal: See below for full musculoskeletal details.

CT ABDOMEN PELVIS FINDINGS

Hepatobiliary: No focal, suspicious hepatic lesion. No
pericholecystic stranding. No biliary duct dilation. Portal vein is
patent. Mild background hepatic steatosis.

Pancreas: Normal, without mass, inflammation or ductal dilatation.

Spleen: Spleen normal size and contour.

Adrenals/Urinary Tract:

Adrenal glands are unremarkable. Symmetric renal enhancement. No
sign of hydronephrosis. No suspicious renal lesion or perinephric
stranding.

Urinary bladder is grossly unremarkable.

Stomach/Bowel: No stranding adjacent to the stomach. No signs of
small bowel abnormality. Normal appendix. Abundant stool in the
colon without signs of adjacent stranding.

Vascular/Lymphatic:

Aorta with smooth contours. IVC with smooth contours. No aneurysmal
dilation of the abdominal aorta. There is no gastrohepatic or
hepatoduodenal ligament lymphadenopathy. No retroperitoneal or
mesenteric lymphadenopathy.

No pelvic sidewall lymphadenopathy.

Reproductive: Unremarkable by CT, no adnexal masses.

Other: No ascites.

Musculoskeletal: No destructive bone finding or acute bone process.

Clips in the RIGHT breast from prior lumpectomy and or biopsy.
IMPRESSION: 1. Mediastinal and hilar nodal enlargement greatest on the RIGHT.
Findings are suspicious for metastatic disease. PET scan could be
considered for further evaluation as warranted.
2. No signs of disease in the abdomen or pelvis.
3. Postoperative changes in the RIGHT breast.
4. Grouped nodules and tree in bud in the LEFT upper lobe, dominant
nodule measuring 5 mm. These are in the posterior LEFT upper lobe
with smaller nodules surrounding this area. These findings are
nonspecific but may represent an infectious or inflammatory process.
Consider attention on short interval follow-up.
5. Mild background hepatic steatosis.
6. Abundant stool in the colon without signs of adjacent stranding.
Correlate with any signs of constipation.

## 2021-06-11 IMAGING — NM NM BONE WHOLE BODY
1 series · 2 of 2 positions shown · non-contrast
Comparison: CT of the chest, abdomen and pelvis of the same date.

CLINICAL DATA: A 60-year-old female presents for evaluation of
breast cancer, assess for metastatic disease.

EXAM:
NUCLEAR MEDICINE WHOLE BODY BONE SCAN
TECHNIQUE: Whole body anterior and posterior images were obtained approximately
3 hours after intravenous injection of radiopharmaceutical.
RADIOPHARMACEUTICALS:  21.83 mCi [0M] MDP IV

[Series 1000: 3 hr wholebody · 2.40mm/px · 2 of 2 frames shown]
[frame 1/2]
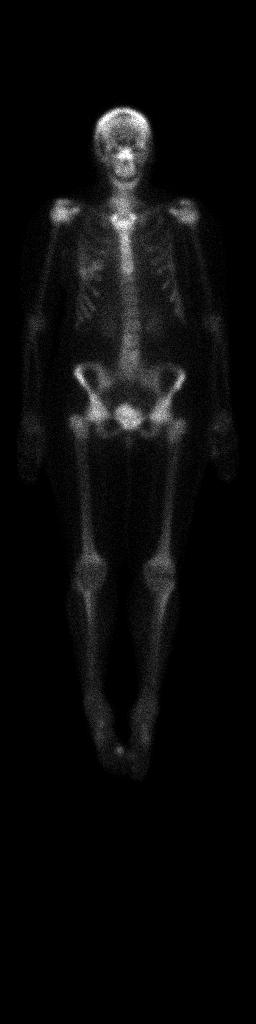
[frame 2/2]
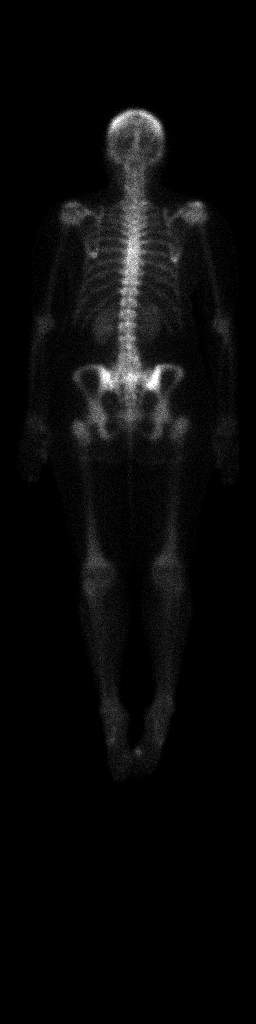

[2 of 2 positions shown; findings below may reference images not displayed]

FINDINGS: Symmetric renal uptake, excretion into the bladder.

No signs of skeletal metastases. Vague radiotracer accumulation over
the RIGHT chest in the area the RIGHT breast may represent findings
related to previous surgical change. Increased uptake in the area of
the first MTP joint on the RIGHT likely degenerative.
IMPRESSION: No signs of skeletal metastatic disease.

## 2021-06-11 MED ORDER — IOHEXOL 300 MG/ML  SOLN
100.0000 mL | Freq: Once | INTRAMUSCULAR | Status: AC | PRN
  Administered 2021-06-11: 100 mL via INTRAVENOUS

## 2021-06-11 MED ORDER — TECHNETIUM TC 99M MEDRONATE IV KIT
20.0000 | PACK | Freq: Once | INTRAVENOUS | Status: AC | PRN
  Administered 2021-06-11: 21.83 via INTRAVENOUS

## 2021-06-12 ENCOUNTER — Telehealth: Payer: Self-pay | Admitting: *Deleted

## 2021-06-12 NOTE — Telephone Encounter (Signed)
I contacted  daughter because Dr. [name] was talking to her yesteday and needed to share images with [facility] Dr. Ernst Bowler . I let daughter know that the images were pushed through with [facility]. Also let her know that we got a pet scan scheduled for 4/28 at 7:30. Dr. [name] requested if daughter could contact [facility] and see if they can get the pet scan sooner that we can get. Daughter is ok with checking on this and get back with [place]. Also pt has an appt for OT with Gwenette Greet next week 4/24. I contacted Gwenette Greet and she cancelled the appt. ?

## 2021-06-13 ENCOUNTER — Other Ambulatory Visit: Payer: 59

## 2021-06-13 ENCOUNTER — Ambulatory Visit: Payer: 59

## 2021-06-13 DIAGNOSIS — C50411 Malignant neoplasm of upper-outer quadrant of right female breast: Secondary | ICD-10-CM | POA: Diagnosis not present

## 2021-06-13 DIAGNOSIS — C7951 Secondary malignant neoplasm of bone: Secondary | ICD-10-CM | POA: Diagnosis not present

## 2021-06-13 DIAGNOSIS — R590 Localized enlarged lymph nodes: Secondary | ICD-10-CM | POA: Diagnosis not present

## 2021-06-13 DIAGNOSIS — Z170 Estrogen receptor positive status [ER+]: Secondary | ICD-10-CM | POA: Diagnosis not present

## 2021-06-16 ENCOUNTER — Other Ambulatory Visit: Payer: Self-pay

## 2021-06-16 ENCOUNTER — Ambulatory Visit: Payer: 59 | Admitting: Occupational Therapy

## 2021-06-16 DIAGNOSIS — C50411 Malignant neoplasm of upper-outer quadrant of right female breast: Secondary | ICD-10-CM

## 2021-06-17 ENCOUNTER — Encounter: Payer: Self-pay | Admitting: Oncology

## 2021-06-17 ENCOUNTER — Other Ambulatory Visit: Payer: Self-pay

## 2021-06-17 ENCOUNTER — Other Ambulatory Visit: Payer: Self-pay | Admitting: *Deleted

## 2021-06-17 DIAGNOSIS — R9389 Abnormal findings on diagnostic imaging of other specified body structures: Secondary | ICD-10-CM

## 2021-06-17 DIAGNOSIS — M545 Low back pain, unspecified: Secondary | ICD-10-CM

## 2021-06-17 DIAGNOSIS — C50411 Malignant neoplasm of upper-outer quadrant of right female breast: Secondary | ICD-10-CM

## 2021-06-17 NOTE — Progress Notes (Signed)
Orders entered

## 2021-06-18 ENCOUNTER — Ambulatory Visit
Admission: RE | Admit: 2021-06-18 | Discharge: 2021-06-18 | Disposition: A | Discharge status: Home / Self Care | Payer: Self-pay | Source: Ambulatory Visit | Attending: Oncology | Admitting: Oncology

## 2021-06-18 ENCOUNTER — Ambulatory Visit
Admission: RE | Admit: 2021-06-18 | Discharge: 2021-06-18 | Disposition: A | Discharge status: Home / Self Care | Payer: 59 | Source: Ambulatory Visit | Attending: Oncology | Admitting: Oncology

## 2021-06-18 ENCOUNTER — Other Ambulatory Visit: Payer: Self-pay | Admitting: *Deleted

## 2021-06-18 DIAGNOSIS — I6782 Cerebral ischemia: Secondary | ICD-10-CM | POA: Diagnosis not present

## 2021-06-18 DIAGNOSIS — C50411 Malignant neoplasm of upper-outer quadrant of right female breast: Secondary | ICD-10-CM | POA: Insufficient documentation

## 2021-06-18 DIAGNOSIS — M545 Low back pain, unspecified: Secondary | ICD-10-CM | POA: Diagnosis not present

## 2021-06-18 DIAGNOSIS — C50919 Malignant neoplasm of unspecified site of unspecified female breast: Secondary | ICD-10-CM | POA: Diagnosis not present

## 2021-06-18 DIAGNOSIS — S22060A Wedge compression fracture of T7-T8 vertebra, initial encounter for closed fracture: Secondary | ICD-10-CM | POA: Diagnosis not present

## 2021-06-18 DIAGNOSIS — M50223 Other cervical disc displacement at C6-C7 level: Secondary | ICD-10-CM | POA: Diagnosis not present

## 2021-06-18 DIAGNOSIS — M5134 Other intervertebral disc degeneration, thoracic region: Secondary | ICD-10-CM | POA: Diagnosis not present

## 2021-06-18 DIAGNOSIS — Q7649 Other congenital malformations of spine, not associated with scoliosis: Secondary | ICD-10-CM | POA: Diagnosis not present

## 2021-06-18 DIAGNOSIS — Z170 Estrogen receptor positive status [ER+]: Secondary | ICD-10-CM | POA: Insufficient documentation

## 2021-06-18 DIAGNOSIS — R9389 Abnormal findings on diagnostic imaging of other specified body structures: Secondary | ICD-10-CM

## 2021-06-18 DIAGNOSIS — M50321 Other cervical disc degeneration at C4-C5 level: Secondary | ICD-10-CM | POA: Diagnosis not present

## 2021-06-18 DIAGNOSIS — M5126 Other intervertebral disc displacement, lumbar region: Secondary | ICD-10-CM | POA: Diagnosis not present

## 2021-06-18 DIAGNOSIS — M4319 Spondylolisthesis, multiple sites in spine: Secondary | ICD-10-CM | POA: Diagnosis not present

## 2021-06-18 DIAGNOSIS — M50322 Other cervical disc degeneration at C5-C6 level: Secondary | ICD-10-CM | POA: Diagnosis not present

## 2021-06-18 DIAGNOSIS — Z853 Personal history of malignant neoplasm of breast: Secondary | ICD-10-CM | POA: Diagnosis not present

## 2021-06-18 DIAGNOSIS — M898X8 Other specified disorders of bone, other site: Secondary | ICD-10-CM | POA: Diagnosis not present

## 2021-06-18 DIAGNOSIS — R590 Localized enlarged lymph nodes: Secondary | ICD-10-CM | POA: Diagnosis not present

## 2021-06-18 IMAGING — MR MR THORACIC SPINE WO/W CM
6 of 9 series · 28 of 48 positions shown · IV contrast (gadavist)
Comparison: PET-CT report [DATE].  No images available.

CLINICAL DATA: History of breast cancer currently under treatment.
Back pain. Abnormal PET-CT at [REDACTED] [DATE] demonstrated
lesions at T9 and right iliac wing.

EXAM:
MRI THORACIC WITHOUT AND WITH CONTRAST
TECHNIQUE: Multiplanar and multiecho pulse sequences of the thoracic spine were
obtained without and with intravenous contrast.
CONTRAST:  6mL GADAVIST GADOBUTROL 1 MMOL/ML IV SOLN

[Series 22: T1 · sagittal · 6.0mm · 1.88mm/px · 1 of 9 slices shown (1 of 3)]
[im 1/9]
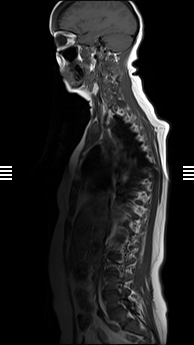

[Series 23: T2 · sagittal · 3.0mm · 1.06mm/px · 3 of 17 slices shown (1 of 2)]
[im 1/17]
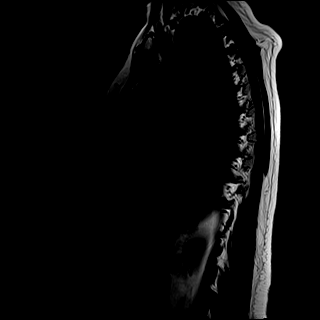
[im 9/17]
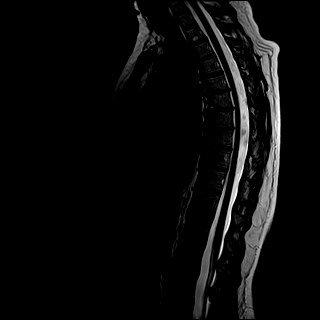
[im 17/17]
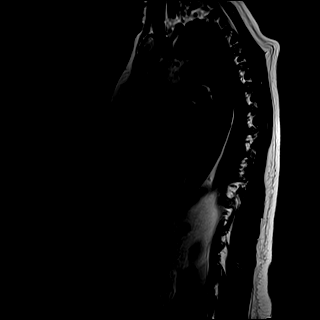

[Series 24: T1 · sagittal · 3.0mm · 1.06mm/px · 4 of 17 slices shown (2 of 3)]
[im 1/17]
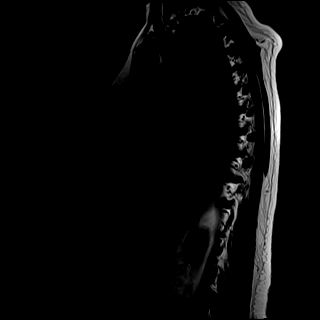
[im 6/17]
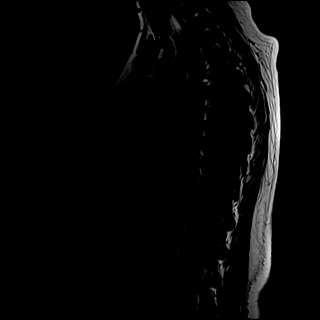
[im 11/17]
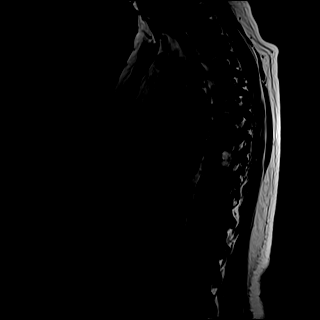
[im 17/17]
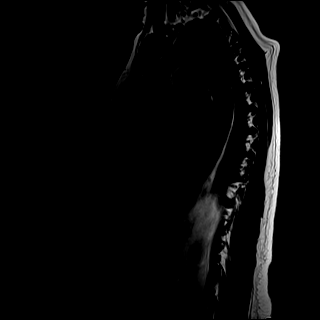

[Series 26: T2 · axial · 4.0mm · 0.59mm/px · z∈[-344,-120]mm · 8 of 39 slices shown (2 of 2)]
[im 1/39]
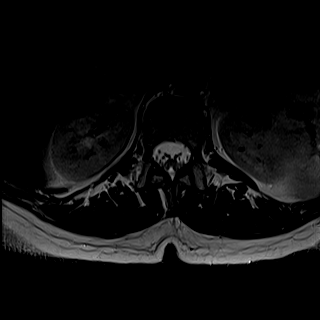
[im 6/39]
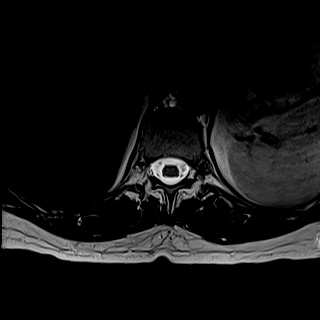
[im 11/39]
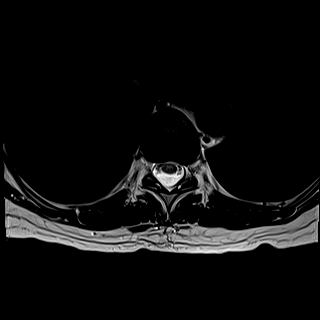
[im 17/39]
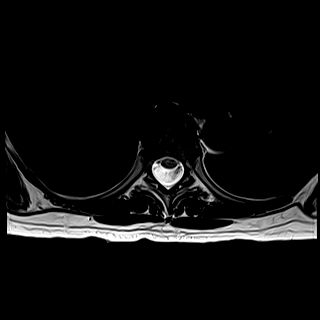
[im 22/39]
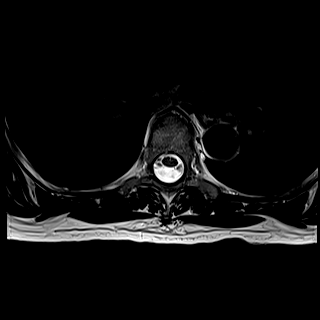
[im 28/39]
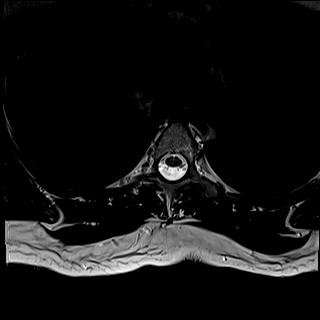
[im 33/39]
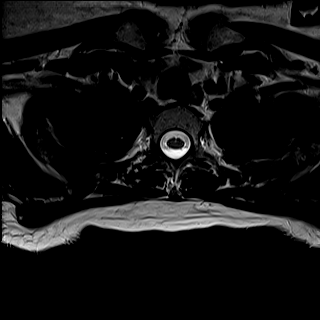
[im 39/39]
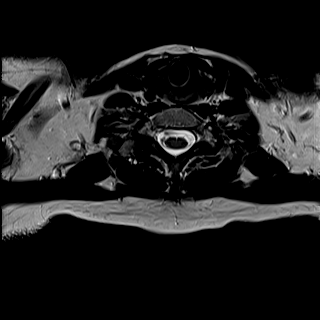

[Series 28: T1 · axial · non-contrast · 4.0mm · 0.31mm/px · z∈[-344,-120]mm · 8 of 39 slices shown (3 of 3)]
[im 1/39]
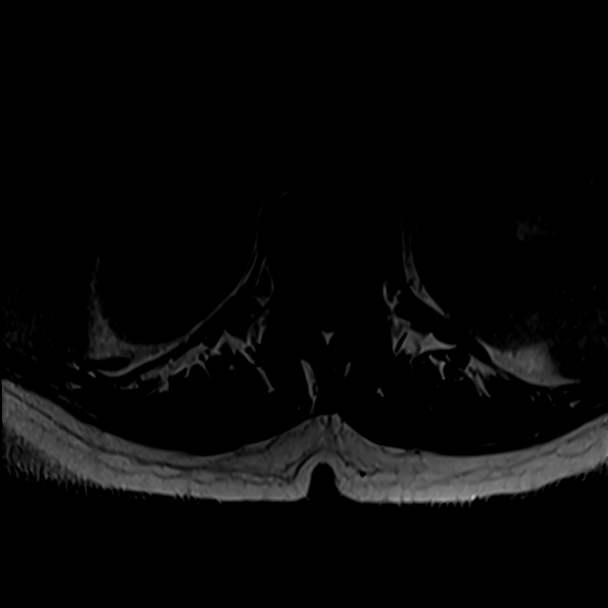
[im 6/39]
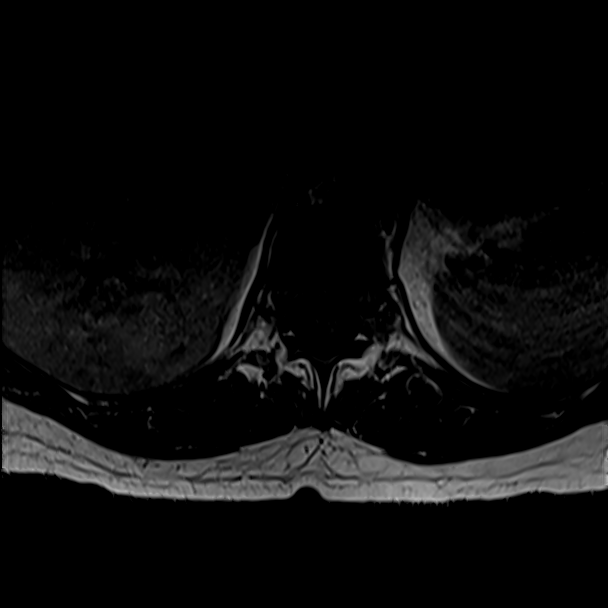
[im 11/39]
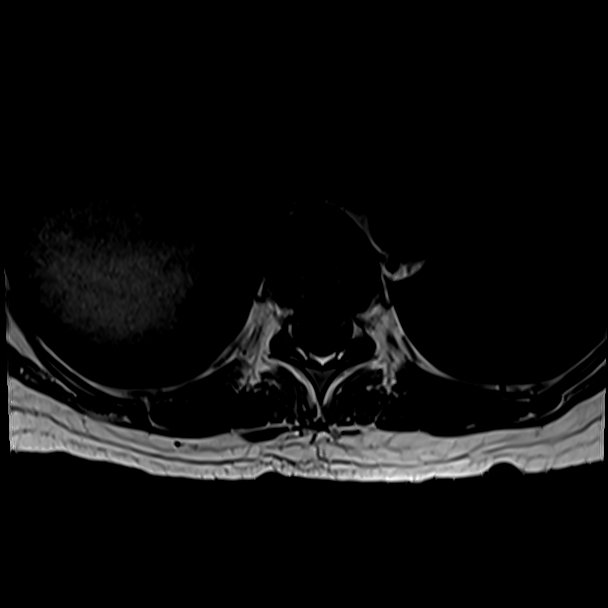
[im 17/39]
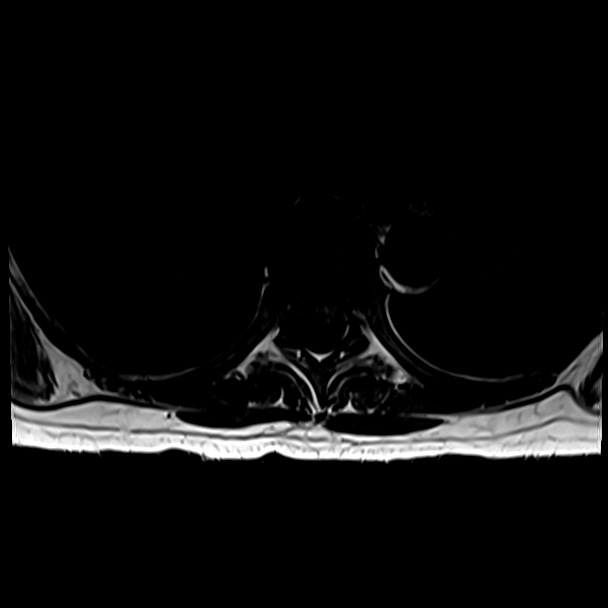
[im 22/39]
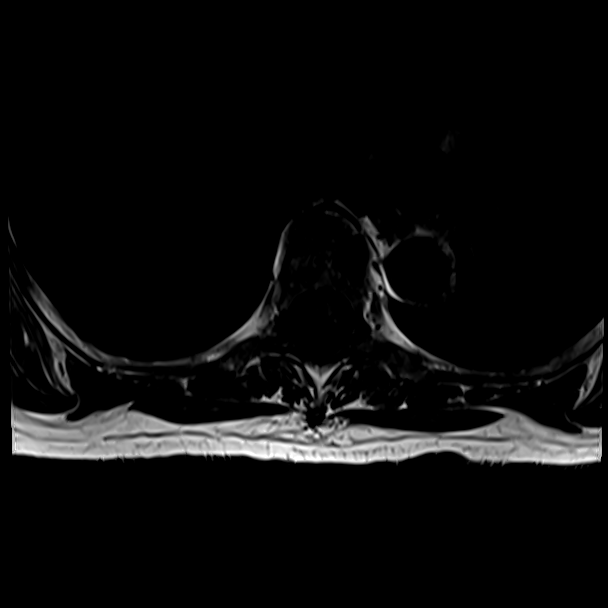
[im 28/39]
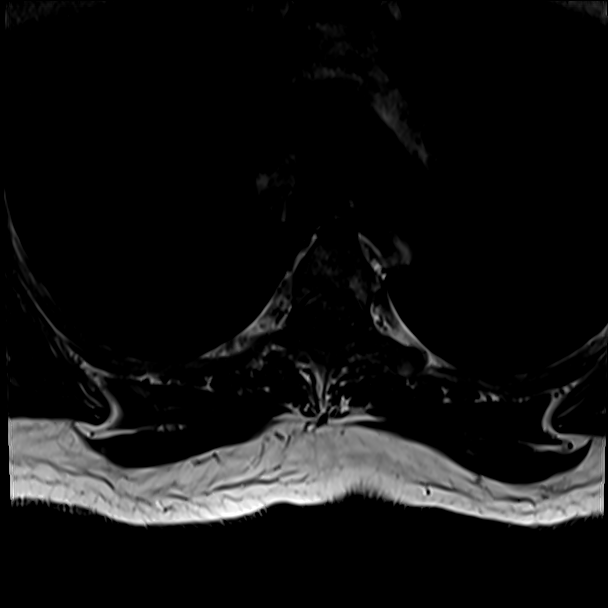
[im 33/39]
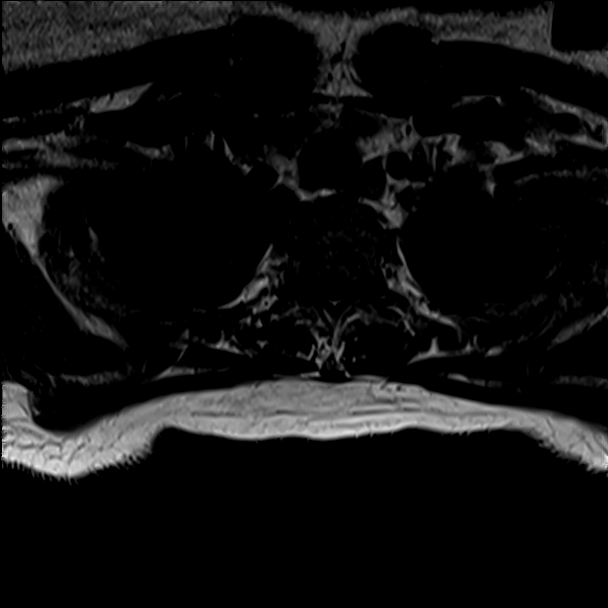
[im 39/39]
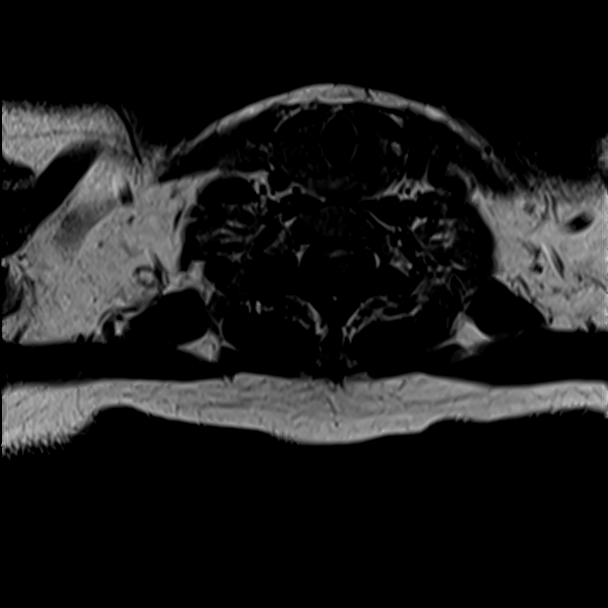

[Series 30: T1 fat-sat post-contrast · sagittal · 3.0mm · 1.06mm/px · 4 of 17 slices shown]
[im 1/17]
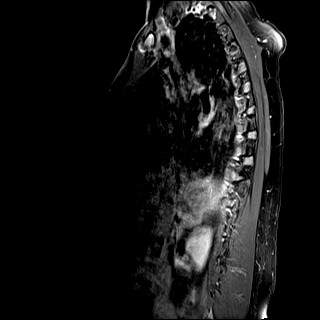
[im 6/17]
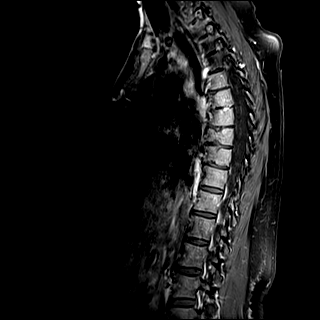
[im 11/17]
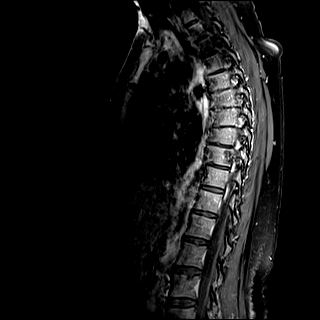
[im 17/17]
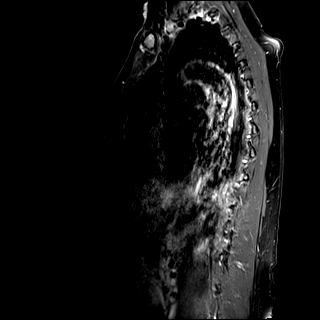

[28 of 48 positions shown; findings below may reference images not displayed]

FINDINGS: Alignment:  Normal

Vertebrae: Mild compression fracture of T8 appears chronic. No acute
fracture.

10 x 20 mm lesion T9 vertebral body on the left. This has high
signal on T1 and T2 compatible with fat content. Lesion does not
enhance. This may represent hemangioma. Hemangiomata typically do
not have increased uptake on PET, and follow-up recommended given
history of metastatic breast cancer.

No other suspicious bone marrow lesion identified.

Cord:  Normal signal and morphology.  No cord compression

Paraspinal and other soft tissues: 10 mm left supraclavicular lymph
node. Mild anterior mediastinal and right paratracheal lymph nodes.
Mild bilateral hilar adenopathy. These were described on the prior
PET-CT as hypermetabolic.

Disc levels:

Disc degeneration is present most notably between T5 and T10 with
disc space narrowing and mild spurring. No significant thoracic
spinal stenosis. No focal disc protrusion.
IMPRESSION: 10 x 20 mm lesion T9 vertebral body on the left. This has fatty
characteristics and is most likely due to hemangioma. This was
reported as hypermetabolic on PET CT. Most hemangiomata are negative
on PET-CT however there are reports showing uptake. Given history of
metastatic breast cancer, close follow-up warranted.

Mediastinal and hilar adenopathy.  Correlate with prior PET report

## 2021-06-18 IMAGING — MR MR HEAD WO/W CM
15 series · 48 of 48 positions shown · IV contrast (gadavist)
Comparison: None.

CLINICAL DATA: Metastatic breast cancer

EXAM:
MRI HEAD WITHOUT AND WITH CONTRAST
TECHNIQUE: Multiplanar, multiecho pulse sequences of the brain and surrounding
structures were obtained without and with intravenous contrast.
CONTRAST:  6mL GADAVIST GADOBUTROL 1 MMOL/ML IV SOLN

[Series 5: ax dwi_tracew · axial · 3.0mm · 1.80mm/px · z∈[-42,+111]mm · 2 of 48 slices shown]
[im 1/48]
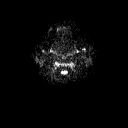
[im 48/48]
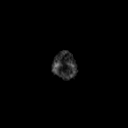

[Series 6: ax dwi_adc · axial · 3.0mm · 1.80mm/px · z∈[-42,+111]mm · 2 of 48 slices shown]
[im 1/48]
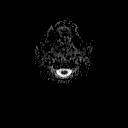
[im 48/48]
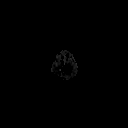

[Series 7: cor dwi_tracew · coronal · 5.0mm · 1.80mm/px · 2 of 38 slices shown]
[im 1/38]
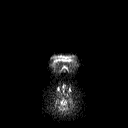
[im 38/38]
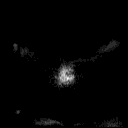

[Series 8: cor dwi_adc · coronal · 5.0mm · 1.80mm/px · 2 of 37 slices shown]
[im 1/37]
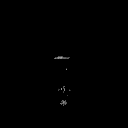
[im 37/37]
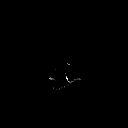

[Series 9: FLAIR · axial · 3.0mm · 0.69mm/px · z∈[-45,+115]mm · 3 of 55 slices shown]
[im 1/55]
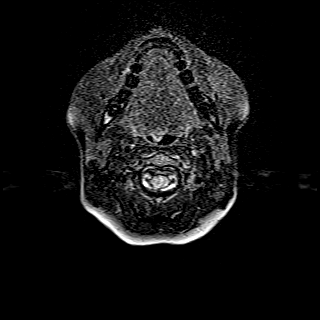
[im 28/55]
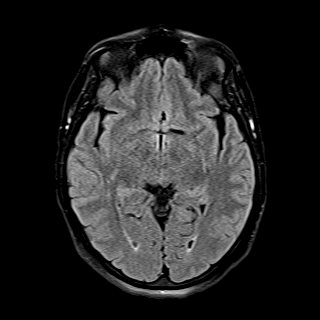
[im 55/55]
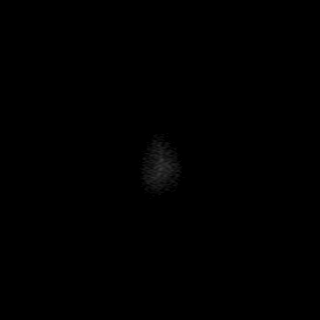

[Series 10: mag_images · axial · 3.0mm · 0.90mm/px · z∈[-41,+109]mm · 3 of 52 slices shown]
[im 1/52]
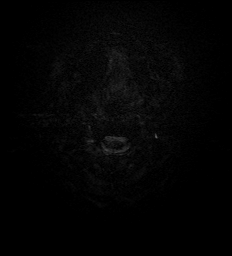
[im 26/52]
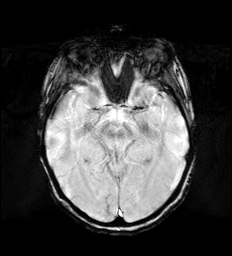
[im 52/52]
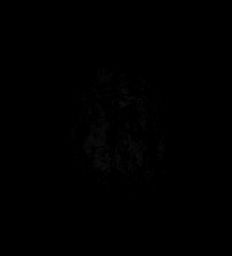

[Series 11: pha_images · axial · 3.0mm · 0.90mm/px · z∈[-41,+109]mm · 3 of 52 slices shown]
[im 1/52]
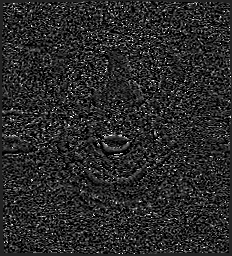
[im 26/52]
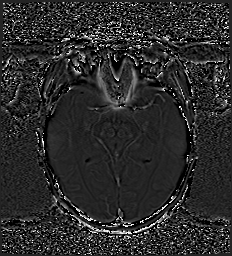
[im 52/52]
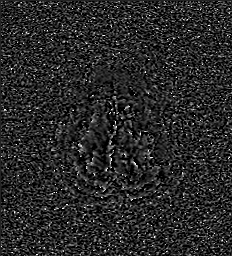

[Series 12: swi_images · axial · 3.0mm · 0.90mm/px · z∈[-41,+109]mm · 3 of 52 slices shown]
[im 1/52]
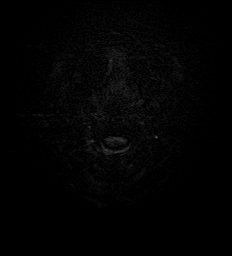
[im 26/52]
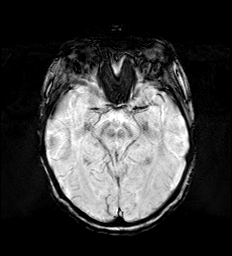
[im 52/52]
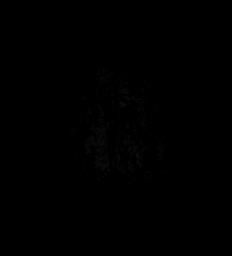

[Series 13: mip_images(sw) · axial · 24.0mm · 0.90mm/px · z∈[-31,+99]mm · 2 of 45 slices shown]
[im 1/45]
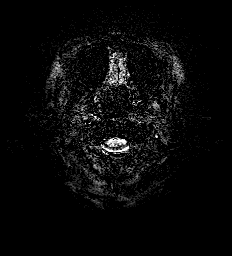
[im 45/45]
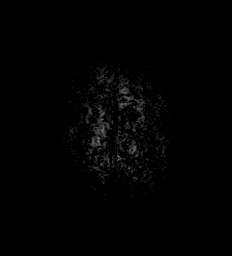

[Series 14: T1 · sagittal · 5.0mm · 0.62mm/px · 1 of 23 slices shown (1 of 2)]
[im 1/23]
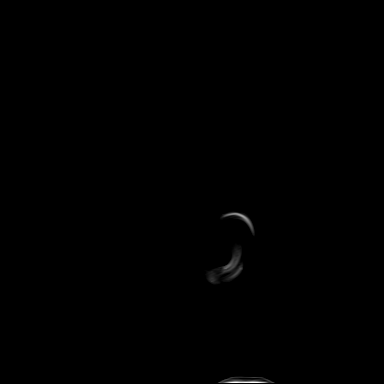

[Series 15: T2 · axial · 5.0mm · 0.86mm/px · 1 of 25 slices shown (1 of 2)]
[im 1/25]
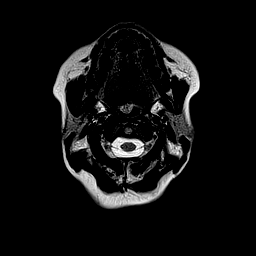

[Series 16: T1 · axial · 1.0mm · 0.98mm/px · z∈[-54,+119]mm · 10 of 176 slices shown (2 of 2)]
[im 1/176]
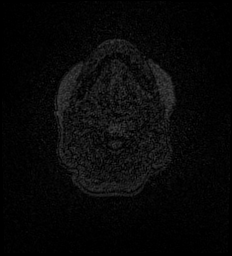
[im 20/176]
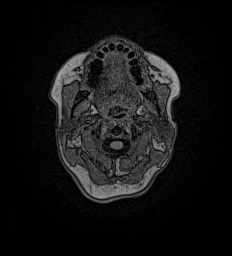
[im 39/176]
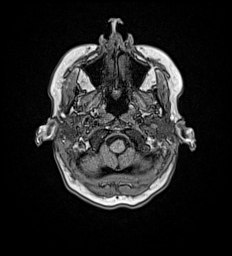
[im 59/176]
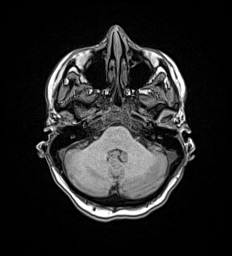
[im 78/176]
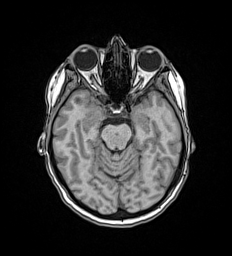
[im 98/176]
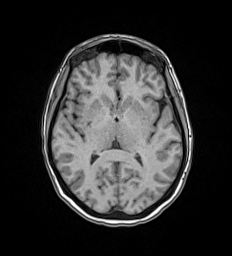
[im 117/176]
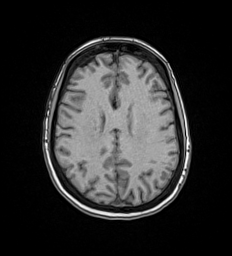
[im 137/176]
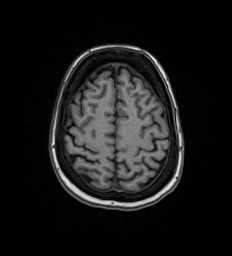
[im 156/176]
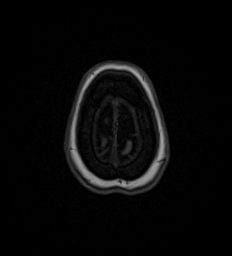
[im 176/176]
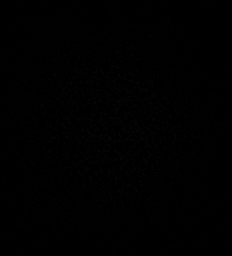

[Series 17: T2 · coronal · 5.0mm · 0.86mm/px · 2 of 29 slices shown (2 of 2)]
[im 1/29]
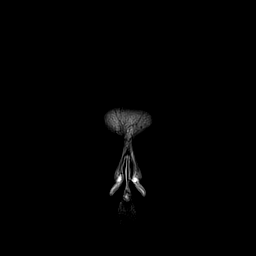
[im 29/29]
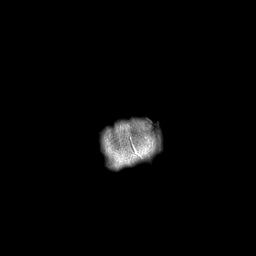

[Series 18: T1 post-contrast · axial · 1.0mm · 0.98mm/px · z∈[-54,+119]mm · 10 of 176 slices shown (1 of 2)]
[im 1/176]
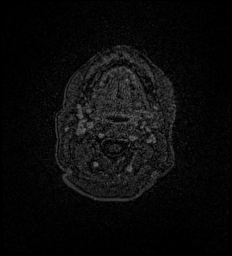
[im 20/176]
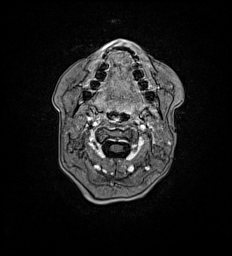
[im 39/176]
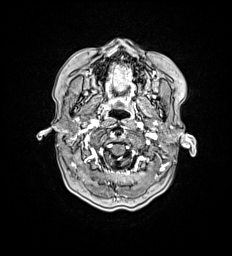
[im 59/176]
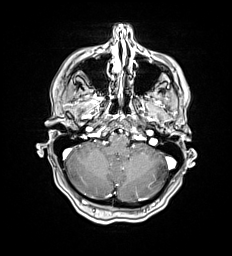
[im 78/176]
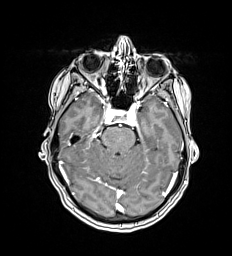
[im 98/176]
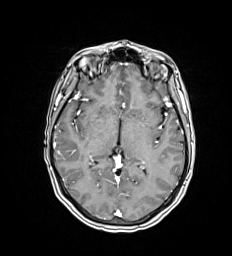
[im 117/176]
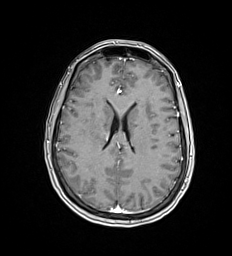
[im 137/176]
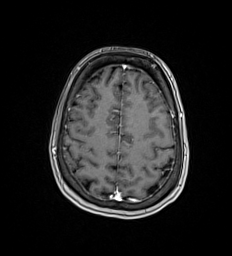
[im 156/176]
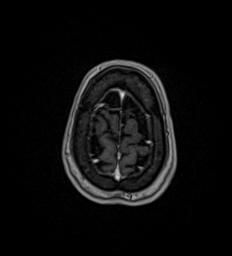
[im 176/176]
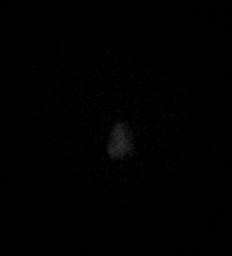

[Series 19: T1 post-contrast · coronal · 5.0mm · 0.57mm/px · 2 of 29 slices shown (2 of 2)]
[im 1/29]
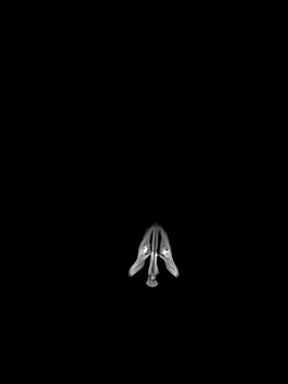
[im 29/29]
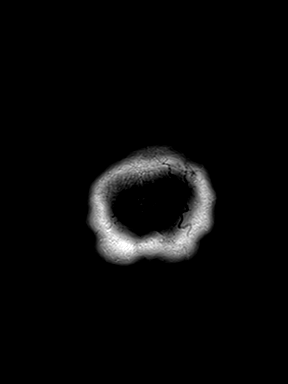

[48 of 48 positions shown; findings below may reference images not displayed]

FINDINGS: Brain: No acute infarction, hemorrhage, hydrocephalus, extra-axial
collection or mass lesion.

Normal enhancement.  Negative for metastatic disease

Mild white matter changes withpatchy hyperintensity in the cerebral
white matter and in the pons. Negative for intracranial hemorrhage

Vascular: Normal arterial flow voids.

Skull and upper cervical spine: No focal skeletal lesion.

Sinuses/Orbits: Paranasal sinuses clear.  Negative orbit

Other: None
IMPRESSION: 1. Negative for metastatic disease to the brain
2. Mild chronic microvascular ischemic change white matter and pons.
No acute infarct.

## 2021-06-18 IMAGING — MR MR CERVICAL SPINE WO/W CM
5 of 8 series · 29 of 48 positions shown · IV contrast (gadavist)
Comparison: None.

CLINICAL DATA: Metastatic breast cancer.  Abnormal PET CT.

EXAM:
MRI CERVICAL SPINE WITHOUT AND WITH CONTRAST
TECHNIQUE: Multiplanar and multiecho pulse sequences of the cervical spine, to
include the craniocervical junction and cervicothoracic junction,
were obtained without and with intravenous contrast.
CONTRAST:  6mL GADAVIST GADOBUTROL 1 MMOL/ML IV SOLN

[Series 5: T2 · sagittal · 3.0mm · 0.62mm/px · 4 of 15 slices shown (1 of 2)]
[im 1/15]
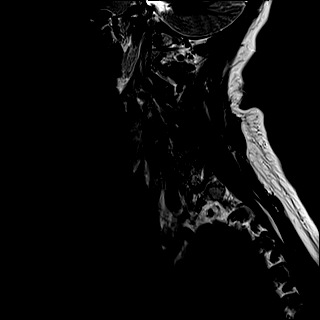
[im 5/15]
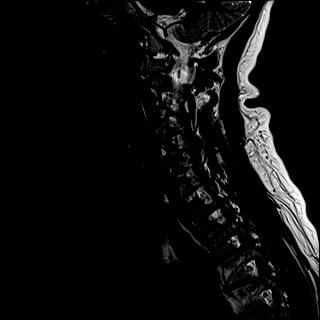
[im 10/15]
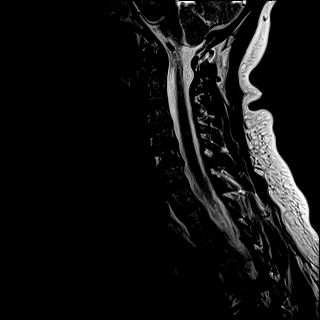
[im 15/15]
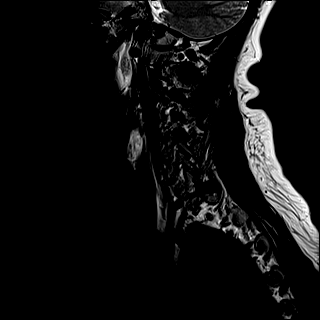

[Series 7: STIR · sagittal · 3.0mm · 0.62mm/px · 4 of 15 slices shown]
[im 1/15]
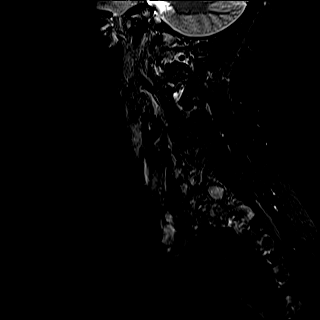
[im 5/15]
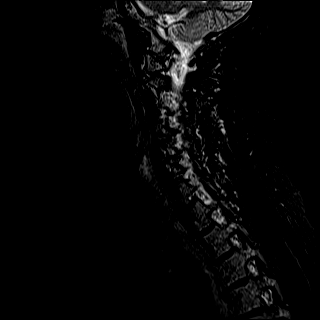
[im 10/15]
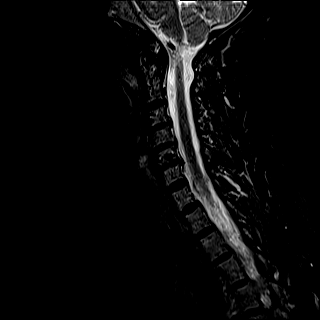
[im 15/15]
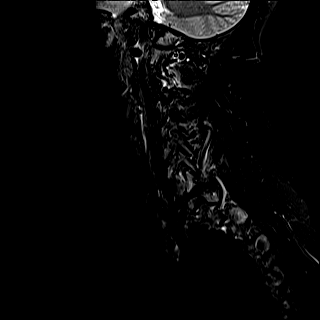

[Series 8: T2 · axial · 3.0mm · 0.70mm/px · z∈[-138,-44]mm · 8 of 29 slices shown (2 of 2)]
[im 1/29]
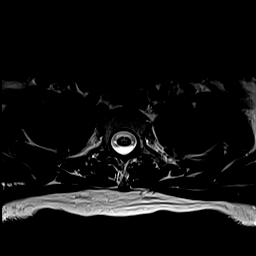
[im 5/29]
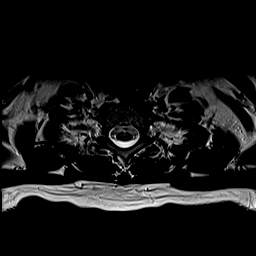
[im 9/29]
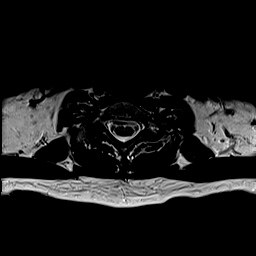
[im 13/29]
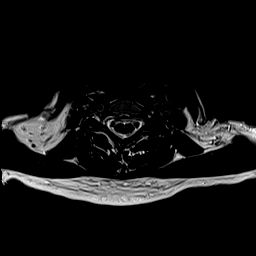
[im 17/29]
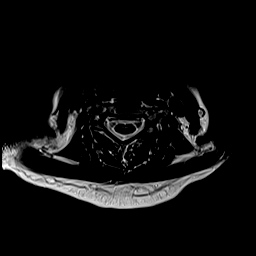
[im 21/29]
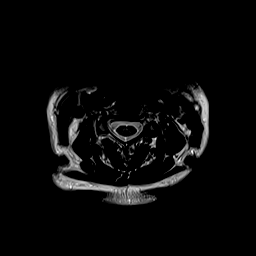
[im 25/29]
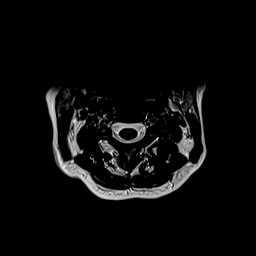
[im 29/29]
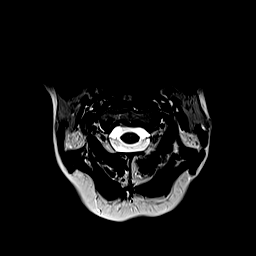

[Series 10: T1 · axial · non-contrast · 3.0mm · 0.35mm/px · z∈[-138,-44]mm · 8 of 29 slices shown]
[im 1/29]
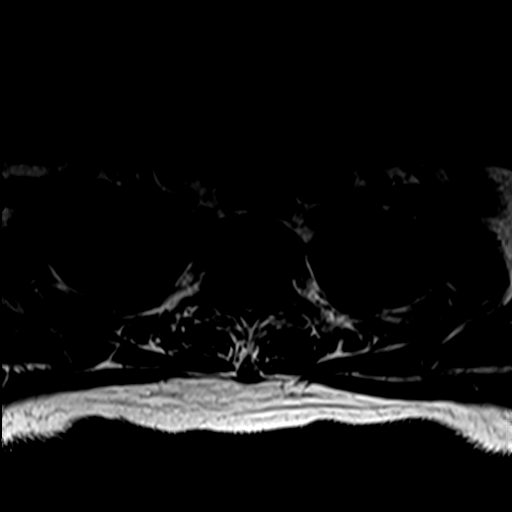
[im 5/29]
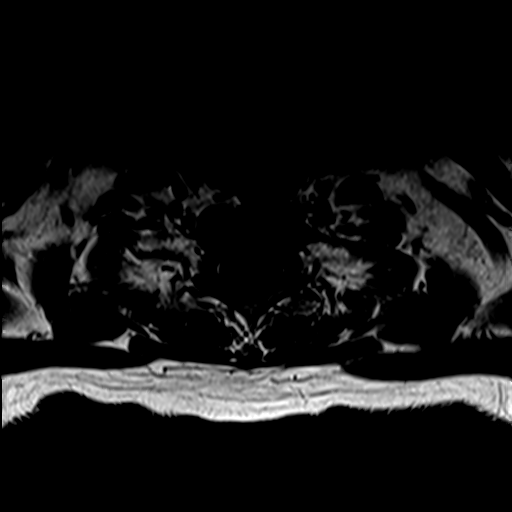
[im 9/29]
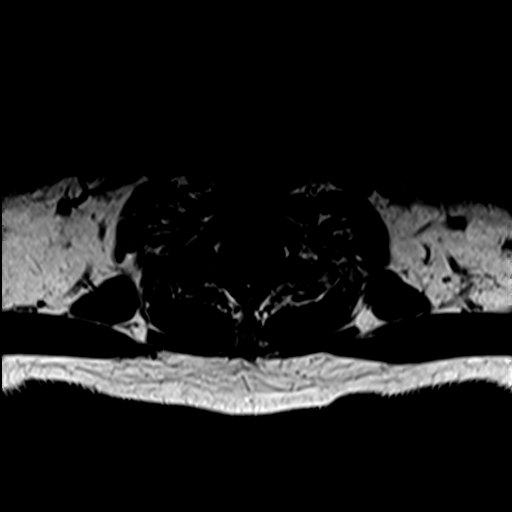
[im 13/29]
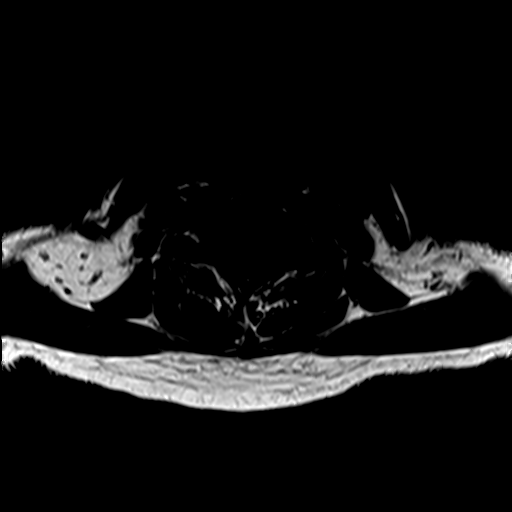
[im 17/29]
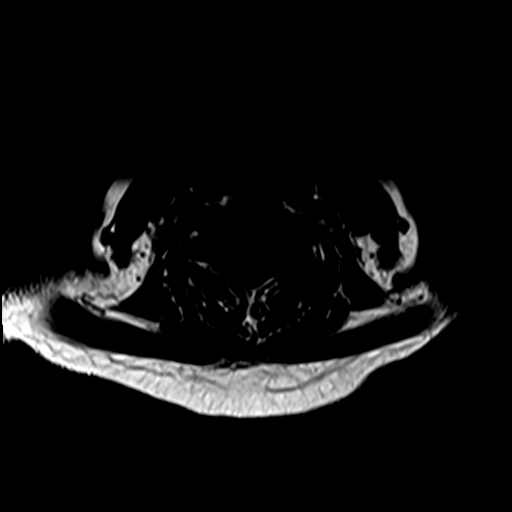
[im 21/29]
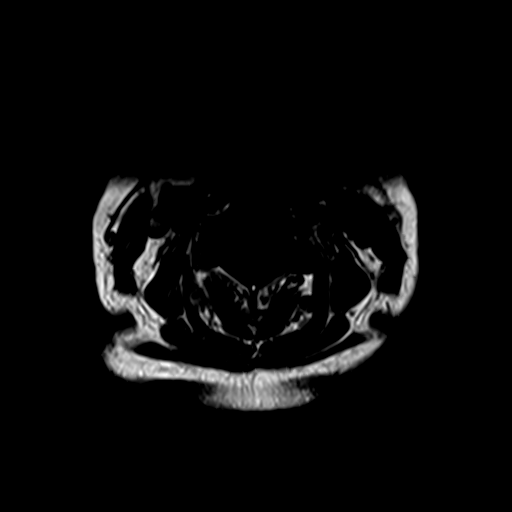
[im 25/29]
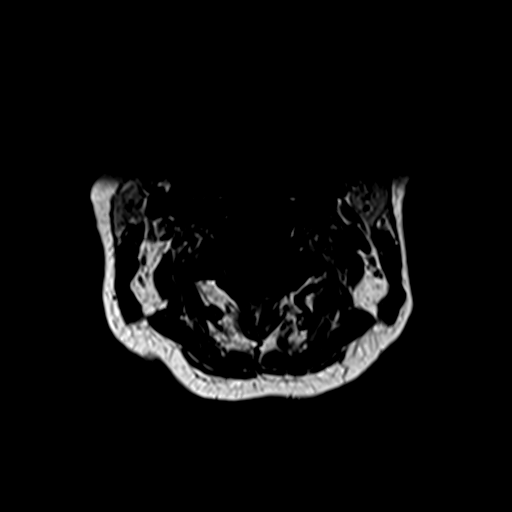
[im 29/29]
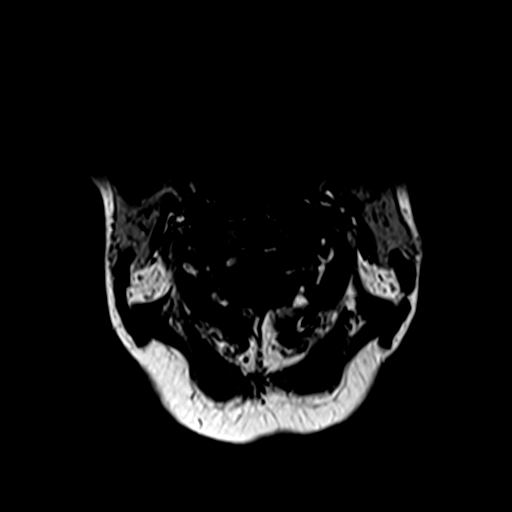

[Series 12: T1 post-contrast · axial · 3.0mm · 0.35mm/px · z∈[-138,-84]mm · 5 of 29 slices shown]
[im 1/29]
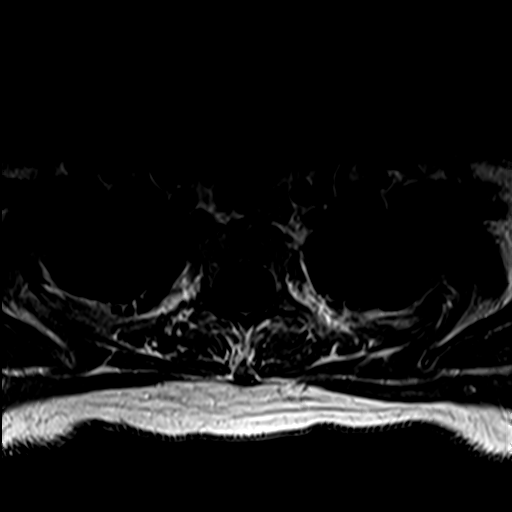
[im 5/29]
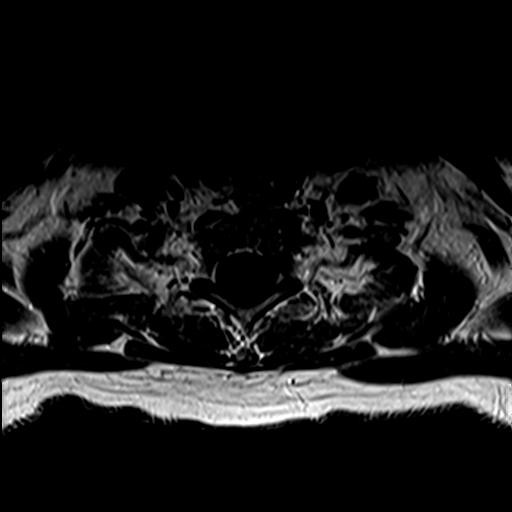
[im 9/29]
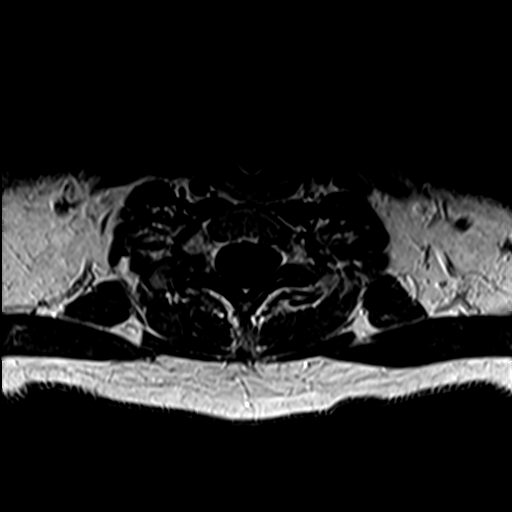
[im 13/29]
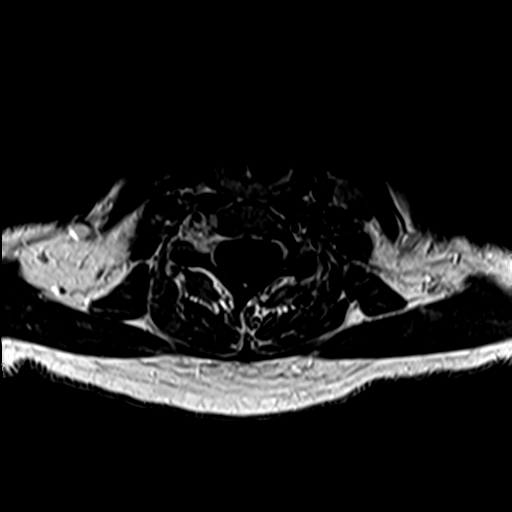
[im 17/29]
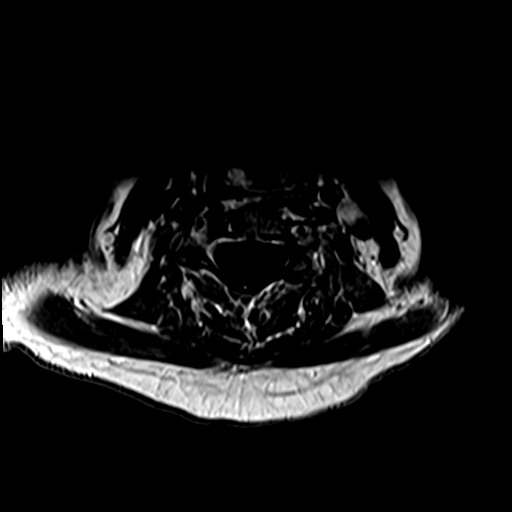

[29 of 48 positions shown; findings below may reference images not displayed]

FINDINGS: Alignment: Mild anterolisthesis C7-T1 otherwise normal alignment.

Vertebrae: Negative for fracture or mass. Negative for metastatic
disease

Cord: Normal signal and morphology

Posterior Fossa, vertebral arteries, paraspinal tissues: Negative
for mass or adenopathy in the neck. Normal arterial flow voids

Disc levels:

C2-3: Negative

C3-4: Disc degeneration with diffuse uncinate spurring. Negative for
stenosis

C4-5: Disc degeneration with mild uncinate spurring. Negative for
stenosis

C5-6: Disc degeneration with diffuse uncinate spurring. Mild
foraminal narrowing bilaterally. Spinal canal adequate in diameter

C6-7: Mild disc bulging.  Negative for stenosis

C7-T1: Right-sided facet degeneration with slight anterolisthesis.
Negative for stenosis.
IMPRESSION: Negative for metastatic disease cervical spine

Mild spondylosis, most prominent C5-6 with mild foraminal narrowing
bilaterally.

## 2021-06-18 MED ORDER — GADOBUTROL 1 MMOL/ML IV SOLN
6.0000 mL | Freq: Once | INTRAVENOUS | Status: AC | PRN
  Administered 2021-06-18: 6 mL via INTRAVENOUS

## 2021-06-18 NOTE — Progress Notes (Signed)
Orders for sharing scan ?

## 2021-06-19 ENCOUNTER — Other Ambulatory Visit: Payer: Self-pay

## 2021-06-19 ENCOUNTER — Ambulatory Visit: Payer: 59

## 2021-06-19 ENCOUNTER — Other Ambulatory Visit: Payer: Self-pay | Admitting: Family Medicine

## 2021-06-19 MED ORDER — SILVER SULFADIAZINE 1 % EX CREA
TOPICAL_CREAM | CUTANEOUS | 1 refills | Status: DC
  Filled 2021-06-19: qty 50, 7d supply, fill #0

## 2021-06-20 ENCOUNTER — Other Ambulatory Visit: Payer: Self-pay

## 2021-06-20 ENCOUNTER — Other Ambulatory Visit: Payer: Self-pay | Admitting: Oncology

## 2021-06-20 ENCOUNTER — Ambulatory Visit (HOSPITAL_COMMUNITY): Payer: 59

## 2021-06-20 MED ORDER — PANTOPRAZOLE SODIUM 20 MG PO TBEC
20.0000 mg | DELAYED_RELEASE_TABLET | Freq: Every day | ORAL | 1 refills | Status: DC
  Filled 2021-06-20: qty 30, 30d supply, fill #0

## 2021-06-24 DIAGNOSIS — R9389 Abnormal findings on diagnostic imaging of other specified body structures: Secondary | ICD-10-CM | POA: Diagnosis not present

## 2021-06-24 DIAGNOSIS — Z170 Estrogen receptor positive status [ER+]: Secondary | ICD-10-CM | POA: Diagnosis not present

## 2021-06-24 DIAGNOSIS — Z09 Encounter for follow-up examination after completed treatment for conditions other than malignant neoplasm: Secondary | ICD-10-CM | POA: Diagnosis not present

## 2021-06-24 DIAGNOSIS — C50411 Malignant neoplasm of upper-outer quadrant of right female breast: Secondary | ICD-10-CM | POA: Diagnosis not present

## 2021-06-25 NOTE — Progress Notes (Signed)
Pharmacist Chemotherapy Monitoring - Initial Assessment   ? ?Anticipated start date: 07/02/21  ? ?The following has been reviewed per standard work regarding the patient's treatment regimen: ?The patient's diagnosis, treatment plan and drug doses, and organ/hematologic function ?Lab orders and baseline tests specific to treatment regimen  ?The treatment plan start date, drug sequencing, and pre-medications ?Prior authorization status  ?Patient's documented medication list, including drug-drug interaction screen and prescriptions for anti-emetics and supportive care specific to the treatment regimen ?The drug concentrations, fluid compatibility, administration routes, and timing of the medications to be used ?The patient's access for treatment and lifetime cumulative dose history, if applicable  ?The patient's medication allergies and previous infusion related reactions, if applicable  ? ?Changes made to treatment plan:  ?treatment plan date ? ?Follow up needed:  ?N/A ? ? ?Adelina Mings, [facility], ?06/25/2021  10:42 AM  ?

## 2021-06-27 ENCOUNTER — Encounter: Payer: Self-pay | Admitting: Oncology

## 2021-07-01 ENCOUNTER — Other Ambulatory Visit: Payer: Self-pay

## 2021-07-02 ENCOUNTER — Inpatient Hospital Stay: Payer: 59 | Admitting: Occupational Therapy

## 2021-07-02 ENCOUNTER — Inpatient Hospital Stay: Payer: 59

## 2021-07-02 ENCOUNTER — Inpatient Hospital Stay: Payer: 59 | Admitting: Oncology

## 2021-07-02 ENCOUNTER — Ambulatory Visit: Payer: 59 | Admitting: Radiation Oncology

## 2021-07-03 DIAGNOSIS — R918 Other nonspecific abnormal finding of lung field: Secondary | ICD-10-CM | POA: Diagnosis not present

## 2021-07-03 DIAGNOSIS — R591 Generalized enlarged lymph nodes: Secondary | ICD-10-CM | POA: Diagnosis not present

## 2021-07-03 DIAGNOSIS — Z853 Personal history of malignant neoplasm of breast: Secondary | ICD-10-CM | POA: Diagnosis not present

## 2021-07-03 DIAGNOSIS — M47812 Spondylosis without myelopathy or radiculopathy, cervical region: Secondary | ICD-10-CM | POA: Diagnosis not present

## 2021-07-03 DIAGNOSIS — M4802 Spinal stenosis, cervical region: Secondary | ICD-10-CM | POA: Diagnosis not present

## 2021-07-03 DIAGNOSIS — M47816 Spondylosis without myelopathy or radiculopathy, lumbar region: Secondary | ICD-10-CM | POA: Diagnosis not present

## 2021-07-03 DIAGNOSIS — M48061 Spinal stenosis, lumbar region without neurogenic claudication: Secondary | ICD-10-CM | POA: Diagnosis not present

## 2021-07-03 DIAGNOSIS — C50411 Malignant neoplasm of upper-outer quadrant of right female breast: Secondary | ICD-10-CM | POA: Diagnosis not present

## 2021-07-03 DIAGNOSIS — Z20822 Contact with and (suspected) exposure to covid-19: Secondary | ICD-10-CM | POA: Diagnosis not present

## 2021-07-03 DIAGNOSIS — C801 Malignant (primary) neoplasm, unspecified: Secondary | ICD-10-CM | POA: Diagnosis not present

## 2021-07-03 DIAGNOSIS — R590 Localized enlarged lymph nodes: Secondary | ICD-10-CM | POA: Diagnosis not present

## 2021-07-14 DIAGNOSIS — Z4889 Encounter for other specified surgical aftercare: Secondary | ICD-10-CM | POA: Diagnosis not present

## 2021-07-14 DIAGNOSIS — C50411 Malignant neoplasm of upper-outer quadrant of right female breast: Secondary | ICD-10-CM | POA: Diagnosis not present

## 2021-07-14 DIAGNOSIS — Z170 Estrogen receptor positive status [ER+]: Secondary | ICD-10-CM | POA: Diagnosis not present

## 2021-07-14 DIAGNOSIS — C50919 Malignant neoplasm of unspecified site of unspecified female breast: Secondary | ICD-10-CM | POA: Diagnosis not present

## 2021-07-16 ENCOUNTER — Encounter: Payer: Self-pay | Admitting: Oncology

## 2021-07-16 ENCOUNTER — Other Ambulatory Visit: Payer: Self-pay | Admitting: *Deleted

## 2021-07-16 DIAGNOSIS — C50411 Malignant neoplasm of upper-outer quadrant of right female breast: Secondary | ICD-10-CM

## 2021-07-22 ENCOUNTER — Encounter: Payer: Self-pay | Admitting: Oncology

## 2021-07-23 ENCOUNTER — Encounter: Payer: Self-pay | Admitting: *Deleted

## 2021-07-31 DIAGNOSIS — J4520 Mild intermittent asthma, uncomplicated: Secondary | ICD-10-CM | POA: Diagnosis not present

## 2021-07-31 DIAGNOSIS — Z170 Estrogen receptor positive status [ER+]: Secondary | ICD-10-CM | POA: Diagnosis not present

## 2021-07-31 DIAGNOSIS — C50911 Malignant neoplasm of unspecified site of right female breast: Secondary | ICD-10-CM | POA: Diagnosis not present

## 2021-07-31 DIAGNOSIS — Z86000 Personal history of in-situ neoplasm of breast: Secondary | ICD-10-CM | POA: Diagnosis not present

## 2021-07-31 DIAGNOSIS — C50411 Malignant neoplasm of upper-outer quadrant of right female breast: Secondary | ICD-10-CM | POA: Diagnosis not present

## 2021-07-31 DIAGNOSIS — C773 Secondary and unspecified malignant neoplasm of axilla and upper limb lymph nodes: Secondary | ICD-10-CM | POA: Diagnosis not present

## 2021-08-01 DIAGNOSIS — Z170 Estrogen receptor positive status [ER+]: Secondary | ICD-10-CM | POA: Diagnosis not present

## 2021-08-01 DIAGNOSIS — C50911 Malignant neoplasm of unspecified site of right female breast: Secondary | ICD-10-CM | POA: Diagnosis not present

## 2021-08-01 DIAGNOSIS — J4520 Mild intermittent asthma, uncomplicated: Secondary | ICD-10-CM | POA: Diagnosis not present

## 2021-08-06 DIAGNOSIS — C50411 Malignant neoplasm of upper-outer quadrant of right female breast: Secondary | ICD-10-CM | POA: Diagnosis not present

## 2021-08-06 DIAGNOSIS — Z09 Encounter for follow-up examination after completed treatment for conditions other than malignant neoplasm: Secondary | ICD-10-CM | POA: Diagnosis not present

## 2021-08-06 DIAGNOSIS — Z170 Estrogen receptor positive status [ER+]: Secondary | ICD-10-CM | POA: Diagnosis not present

## 2021-08-12 ENCOUNTER — Other Ambulatory Visit: Payer: Self-pay | Admitting: *Deleted

## 2021-08-12 DIAGNOSIS — C50411 Malignant neoplasm of upper-outer quadrant of right female breast: Secondary | ICD-10-CM

## 2021-08-12 DIAGNOSIS — Z09 Encounter for follow-up examination after completed treatment for conditions other than malignant neoplasm: Secondary | ICD-10-CM | POA: Diagnosis not present

## 2021-08-12 DIAGNOSIS — Z170 Estrogen receptor positive status [ER+]: Secondary | ICD-10-CM | POA: Diagnosis not present

## 2021-08-13 ENCOUNTER — Inpatient Hospital Stay: Payer: 59

## 2021-08-13 ENCOUNTER — Telehealth: Payer: Self-pay | Admitting: Pharmacist

## 2021-08-13 ENCOUNTER — Inpatient Hospital Stay: Payer: 59 | Attending: Oncology

## 2021-08-13 ENCOUNTER — Ambulatory Visit
Admission: RE | Admit: 2021-08-13 | Discharge: 2021-08-13 | Disposition: A | Discharge status: Home / Self Care | Payer: 59 | Source: Ambulatory Visit | Attending: Radiation Oncology | Admitting: Radiation Oncology

## 2021-08-13 ENCOUNTER — Inpatient Hospital Stay (HOSPITAL_BASED_OUTPATIENT_CLINIC_OR_DEPARTMENT_OTHER): Payer: 59 | Admitting: Oncology

## 2021-08-13 ENCOUNTER — Other Ambulatory Visit: Payer: Self-pay | Admitting: *Deleted

## 2021-08-13 ENCOUNTER — Telehealth: Payer: Self-pay | Admitting: Pharmacy Technician

## 2021-08-13 ENCOUNTER — Inpatient Hospital Stay: Payer: 59 | Admitting: Occupational Therapy

## 2021-08-13 VITALS — BP 115/78 | HR 67 | Temp 96.9°F | Resp 18 | Wt 140.0 lb

## 2021-08-13 DIAGNOSIS — C50411 Malignant neoplasm of upper-outer quadrant of right female breast: Secondary | ICD-10-CM | POA: Insufficient documentation

## 2021-08-13 DIAGNOSIS — Z170 Estrogen receptor positive status [ER+]: Secondary | ICD-10-CM

## 2021-08-13 DIAGNOSIS — Z79899 Other long term (current) drug therapy: Secondary | ICD-10-CM | POA: Insufficient documentation

## 2021-08-13 DIAGNOSIS — M25611 Stiffness of right shoulder, not elsewhere classified: Secondary | ICD-10-CM

## 2021-08-13 DIAGNOSIS — M79621 Pain in right upper arm: Secondary | ICD-10-CM

## 2021-08-13 DIAGNOSIS — Z9189 Other specified personal risk factors, not elsewhere classified: Secondary | ICD-10-CM

## 2021-08-13 DIAGNOSIS — J45909 Unspecified asthma, uncomplicated: Secondary | ICD-10-CM | POA: Insufficient documentation

## 2021-08-13 DIAGNOSIS — Z5112 Encounter for antineoplastic immunotherapy: Secondary | ICD-10-CM | POA: Diagnosis not present

## 2021-08-13 LAB — CBC WITH DIFFERENTIAL/PLATELET
Abs Immature Granulocytes: 0.01 10*3/uL (ref 0.00–0.07)
Basophils Absolute: 0 10*3/uL (ref 0.0–0.1)
Basophils Relative: 0 %
Eosinophils Absolute: 0.2 10*3/uL (ref 0.0–0.5)
Eosinophils Relative: 3 %
HCT: 39.2 % (ref 36.0–46.0)
Hemoglobin: 13.3 g/dL (ref 12.0–15.0)
Immature Granulocytes: 0 %
Lymphocytes Relative: 29 %
Lymphs Abs: 1.4 10*3/uL (ref 0.7–4.0)
MCH: 30.2 pg (ref 26.0–34.0)
MCHC: 33.9 g/dL (ref 30.0–36.0)
MCV: 89.1 fL (ref 80.0–100.0)
Monocytes Absolute: 0.4 10*3/uL (ref 0.1–1.0)
Monocytes Relative: 8 %
Neutro Abs: 3 10*3/uL (ref 1.7–7.7)
Neutrophils Relative %: 60 %
Platelets: 198 10*3/uL (ref 150–400)
RBC: 4.4 MIL/uL (ref 3.87–5.11)
RDW: 12.6 % (ref 11.5–15.5)
WBC: 5.1 10*3/uL (ref 4.0–10.5)
nRBC: 0 % (ref 0.0–0.2)

## 2021-08-13 LAB — COMPREHENSIVE METABOLIC PANEL
ALT: 27 U/L (ref 0–44)
AST: 26 U/L (ref 15–41)
Albumin: 4.2 g/dL (ref 3.5–5.0)
Alkaline Phosphatase: 95 U/L (ref 38–126)
Anion gap: 7 (ref 5–15)
BUN: 15 mg/dL (ref 8–23)
CO2: 28 mmol/L (ref 22–32)
Calcium: 9.4 mg/dL (ref 8.9–10.3)
Chloride: 100 mmol/L (ref 98–111)
Creatinine, Ser: 0.72 mg/dL (ref 0.44–1.00)
GFR, Estimated: >60 mL/min (ref >60)
Glucose, Bld: 96 mg/dL (ref 70–99)
Potassium: 4 mmol/L (ref 3.5–5.1)
Sodium: 135 mmol/L (ref 135–145)
Total Bilirubin: 0.5 mg/dL (ref 0.3–1.2)
Total Protein: 7.1 g/dL (ref 6.5–8.1)

## 2021-08-13 LAB — TSH: TSH: 4.096 u[IU]/mL (ref 0.350–4.500)

## 2021-08-13 MED ORDER — SODIUM CHLORIDE 0.9 % IV SOLN
200.0000 mg | Freq: Once | INTRAVENOUS | Status: AC
  Administered 2021-08-13: 200 mg via INTRAVENOUS
  Filled 2021-08-13: qty 200

## 2021-08-13 MED ORDER — SODIUM CHLORIDE 0.9 % IV SOLN
Freq: Once | INTRAVENOUS | Status: AC
  Filled 2021-08-13: qty 250

## 2021-08-13 MED ORDER — HEPARIN SOD (PORK) LOCK FLUSH 100 UNIT/ML IV SOLN
500.0000 [IU] | Freq: Once | INTRAVENOUS | Status: AC | PRN
  Administered 2021-08-13: 500 [IU]
  Filled 2021-08-13: qty 5

## 2021-08-13 NOTE — Telephone Encounter (Signed)
Oral Oncology Pharmacist Encounter  Received new prescription for Xeloda (capecitabine) for the adjuvant treatment of stage IIb breast cancer, ER weakly positive PR negative, HER2 negative, in conjunction with pembrolizumab, planned duration of 6-8 cycles. She will begin the capecitabine following the completion of radiation.   CMP from 08/13/21 assessed, no relevant lab abnormalities. Prescription dose and frequency assessed.   Current medication list in Epic reviewed, one DDIs with capecitabine identified: Pantoprazole: Proton Pump Inhibitors (PPI) may diminish the therapeutic effect of capecitabine, varying information on the clinical impact. Recommend evaluating the need for a PPI/acid suppression. If acid suppression is needed, attempt switching to a H2 antagonist (eg, famotidine) if possible.  Evaluated chart and no patient barriers to medication adherence identified.   Prescription has been e-scribed to the [facility] for benefits analysis and approval.  Oral Oncology Clinic will continue to follow for insurance authorization, copayment issues, initial counseling and start date.   Darl Pikes, PharmD, BCPS, BCOP, CPP Hematology/Oncology Clinical Pharmacist Practitioner [place]/[facility]/AP [facility] [phone]  08/13/2021 2:38 PM

## 2021-08-13 NOTE — Telephone Encounter (Signed)
Oral Oncology Patient Advocate Encounter   Received notification from [employer] that prior authorization for Capecitabine is required.   PA submitted on CoverMyMeds Key [id number]  Status is pending   Oral Oncology Clinic will continue to follow.  [place] [facility] [place] Phone [phone] Fax [phone] 08/13/2021 3:05 PM
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
## 2021-08-13 NOTE — Patient Instructions (Signed)
[facility] AT [place]  Discharge Instructions: Thank you for choosing [facility] to provide your oncology and hematology care.   If you have a lab appointment with the [place], please go directly to the [facility] and check in at the registration area.   Wear comfortable clothing and clothing appropriate for easy access to any Portacath or PICC line.   We strive to give you quality time with your provider. You may need to reschedule your appointment if you arrive late (15 or more minutes).  Arriving late affects you and other patients whose appointments are after yours.  Also, if you miss three or more appointments without notifying the office, you may be dismissed from the clinic at the provider's discretion.      For prescription refill requests, have your pharmacy contact our office and allow 72 hours for refills to be completed.    Today you received the following chemotherapy and/or immunotherapy agents       To help prevent nausea and vomiting after your treatment, we encourage you to take your nausea medication as directed.  BELOW ARE SYMPTOMS THAT SHOULD BE REPORTED IMMEDIATELY: *FEVER GREATER THAN 100.4 F (38 C) OR HIGHER *CHILLS OR SWEATING *NAUSEA AND VOMITING THAT IS NOT CONTROLLED WITH YOUR NAUSEA MEDICATION *UNUSUAL SHORTNESS OF BREATH *UNUSUAL BRUISING OR BLEEDING *URINARY PROBLEMS (pain or burning when urinating, or frequent urination) *BOWEL PROBLEMS (unusual diarrhea, constipation, pain near the anus) TENDERNESS IN MOUTH AND THROAT WITH OR WITHOUT PRESENCE OF ULCERS (sore throat, sores in mouth, or a toothache) UNUSUAL RASH, SWELLING OR PAIN  UNUSUAL VAGINAL DISCHARGE OR ITCHING   Items with * indicate a potential emergency and should be followed up as soon as possible or go to the Emergency Department if any problems should occur.  Please show the CHEMOTHERAPY ALERT CARD or IMMUNOTHERAPY ALERT CARD at check-in to the  Emergency Department and triage nurse.  Should you have questions after your visit or need to cancel or reschedule your appointment, please contact [facility] [place]  Dept: [phone]  and follow the prompts.  Office hours are 8:00 a.m. to 4:30 p.m. Monday - Friday. Please note that voicemails left after 4:00 p.m. may not be returned until the following business day.  We are closed weekends and major holidays. You have access to a nurse at all times for urgent questions. Please call the main number to the clinic Dept: [phone] and follow the prompts.   For any non-urgent questions, you may also contact your provider using MyChart. We now offer e-Visits for anyone 88 and older to request care online for non-urgent symptoms. For details visit [email or web address].   Also download the MyChart app! Go to the app store, search "MyChart", open the app, select [facility], and log in with your MyChart username and password.  Masks are optional in the cancer centers. If you would like for your care team to wear a mask while they are taking care of you, please let them know. For doctor visits, patients may have with them one support person who is at least 62 years old. At this time, visitors are not allowed in the infusion area.
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
## 2021-08-14 ENCOUNTER — Other Ambulatory Visit (HOSPITAL_COMMUNITY): Payer: Self-pay

## 2021-08-14 ENCOUNTER — Other Ambulatory Visit: Payer: Self-pay

## 2021-08-15 ENCOUNTER — Other Ambulatory Visit: Payer: Self-pay

## 2021-08-15 ENCOUNTER — Encounter: Payer: Self-pay | Admitting: Oncology

## 2021-08-15 ENCOUNTER — Other Ambulatory Visit: Payer: Self-pay | Admitting: *Deleted

## 2021-08-15 MED ORDER — LIDOCAINE-PRILOCAINE 2.5-2.5 % EX CREA
1.0000 | TOPICAL_CREAM | CUTANEOUS | 3 refills | Status: DC | PRN
  Filled 2021-08-15: qty 30, 5d supply, fill #0

## 2021-08-19 ENCOUNTER — Encounter: Payer: Self-pay | Admitting: Occupational Therapy

## 2021-08-19 ENCOUNTER — Ambulatory Visit: Payer: 59 | Attending: Oncology | Admitting: Occupational Therapy

## 2021-08-19 DIAGNOSIS — L905 Scar conditions and fibrosis of skin: Secondary | ICD-10-CM | POA: Insufficient documentation

## 2021-08-19 DIAGNOSIS — M25511 Pain in right shoulder: Secondary | ICD-10-CM | POA: Insufficient documentation

## 2021-08-19 DIAGNOSIS — M6281 Muscle weakness (generalized): Secondary | ICD-10-CM | POA: Insufficient documentation

## 2021-08-19 DIAGNOSIS — Z9189 Other specified personal risk factors, not elsewhere classified: Secondary | ICD-10-CM | POA: Insufficient documentation

## 2021-08-19 DIAGNOSIS — M25611 Stiffness of right shoulder, not elsewhere classified: Secondary | ICD-10-CM | POA: Insufficient documentation

## 2021-08-20 ENCOUNTER — Encounter: Payer: Self-pay | Admitting: Oncology

## 2021-08-20 NOTE — Telephone Encounter (Signed)
Sure I am ok with that

## 2021-08-21 ENCOUNTER — Ambulatory Visit: Payer: 59 | Admitting: Occupational Therapy

## 2021-08-21 DIAGNOSIS — L905 Scar conditions and fibrosis of skin: Secondary | ICD-10-CM

## 2021-08-21 DIAGNOSIS — M25611 Stiffness of right shoulder, not elsewhere classified: Secondary | ICD-10-CM | POA: Diagnosis not present

## 2021-08-21 DIAGNOSIS — M6281 Muscle weakness (generalized): Secondary | ICD-10-CM | POA: Diagnosis not present

## 2021-08-21 DIAGNOSIS — Z9189 Other specified personal risk factors, not elsewhere classified: Secondary | ICD-10-CM

## 2021-08-21 DIAGNOSIS — M25511 Pain in right shoulder: Secondary | ICD-10-CM
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
## 2021-08-22 ENCOUNTER — Telehealth: Payer: Self-pay

## 2021-08-22 NOTE — Telephone Encounter (Signed)
Faxed over RX to [employer] for pts compression sleeve for  R UE - 20-61mmhg --along with compression glove for future use when flying. Recieved a fax confirmation.

## 2021-08-25 ENCOUNTER — Ambulatory Visit: Payer: 59 | Attending: Oncology | Admitting: Occupational Therapy

## 2021-08-25 DIAGNOSIS — L905 Scar conditions and fibrosis of skin: Secondary | ICD-10-CM | POA: Insufficient documentation

## 2021-08-25 DIAGNOSIS — Z9189 Other specified personal risk factors, not elsewhere classified: Secondary | ICD-10-CM | POA: Diagnosis not present

## 2021-08-25 DIAGNOSIS — M6281 Muscle weakness (generalized): Secondary | ICD-10-CM | POA: Diagnosis not present

## 2021-08-25 DIAGNOSIS — M25511 Pain in right shoulder: Secondary | ICD-10-CM | POA: Insufficient documentation

## 2021-08-25 DIAGNOSIS — M25611 Stiffness of right shoulder, not elsewhere classified: Secondary | ICD-10-CM | POA: Insufficient documentation
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
## 2021-08-27 ENCOUNTER — Other Ambulatory Visit: Payer: Self-pay

## 2021-08-28 ENCOUNTER — Ambulatory Visit
Admission: RE | Admit: 2021-08-28 | Discharge: 2021-08-28 | Disposition: A | Discharge status: Home / Self Care | Payer: 59 | Source: Ambulatory Visit | Attending: Radiation Oncology | Admitting: Radiation Oncology

## 2021-08-28 DIAGNOSIS — Z510 Encounter for antineoplastic radiation therapy: Secondary | ICD-10-CM | POA: Insufficient documentation

## 2021-08-28 DIAGNOSIS — Z79899 Other long term (current) drug therapy: Secondary | ICD-10-CM | POA: Diagnosis not present

## 2021-08-28 DIAGNOSIS — Z5112 Encounter for antineoplastic immunotherapy: Secondary | ICD-10-CM | POA: Diagnosis not present

## 2021-08-28 DIAGNOSIS — C50411 Malignant neoplasm of upper-outer quadrant of right female breast: Secondary | ICD-10-CM | POA: Insufficient documentation

## 2021-08-29 ENCOUNTER — Ambulatory Visit: Payer: 59 | Admitting: Occupational Therapy

## 2021-08-29 ENCOUNTER — Other Ambulatory Visit: Payer: Self-pay | Admitting: *Deleted

## 2021-08-29 DIAGNOSIS — Z9189 Other specified personal risk factors, not elsewhere classified: Secondary | ICD-10-CM

## 2021-08-29 DIAGNOSIS — M6281 Muscle weakness (generalized): Secondary | ICD-10-CM | POA: Diagnosis not present

## 2021-08-29 DIAGNOSIS — M25511 Pain in right shoulder: Secondary | ICD-10-CM | POA: Diagnosis not present

## 2021-08-29 DIAGNOSIS — L905 Scar conditions and fibrosis of skin: Secondary | ICD-10-CM

## 2021-08-29 DIAGNOSIS — M25611 Stiffness of right shoulder, not elsewhere classified: Secondary | ICD-10-CM

## 2021-08-29 DIAGNOSIS — Z510 Encounter for antineoplastic radiation therapy: Secondary | ICD-10-CM | POA: Diagnosis not present

## 2021-08-29 DIAGNOSIS — Z79899 Other long term (current) drug therapy: Secondary | ICD-10-CM | POA: Diagnosis not present

## 2021-08-29 DIAGNOSIS — Z5112 Encounter for antineoplastic immunotherapy: Secondary | ICD-10-CM | POA: Diagnosis not present

## 2021-08-29 DIAGNOSIS — C50411 Malignant neoplasm of upper-outer quadrant of right female breast: Secondary | ICD-10-CM | POA: Diagnosis not present

## 2021-08-29 DIAGNOSIS — Z170 Estrogen receptor positive status [ER+]: Secondary | ICD-10-CM
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
## 2021-09-01 ENCOUNTER — Other Ambulatory Visit: Payer: Self-pay

## 2021-09-01 DIAGNOSIS — N952 Postmenopausal atrophic vaginitis: Secondary | ICD-10-CM | POA: Diagnosis not present

## 2021-09-01 DIAGNOSIS — Z01419 Encounter for gynecological examination (general) (routine) without abnormal findings: Secondary | ICD-10-CM | POA: Diagnosis not present

## 2021-09-01 DIAGNOSIS — Z1339 Encounter for screening examination for other mental health and behavioral disorders: Secondary | ICD-10-CM | POA: Diagnosis not present

## 2021-09-01 DIAGNOSIS — Z1331 Encounter for screening for depression: Secondary | ICD-10-CM | POA: Diagnosis not present

## 2021-09-01 DIAGNOSIS — Z124 Encounter for screening for malignant neoplasm of cervix: Secondary | ICD-10-CM | POA: Diagnosis not present

## 2021-09-01 MED ORDER — ESTRADIOL 0.1 MG/GM VA CREA
TOPICAL_CREAM | VAGINAL | 5 refills | Status: DC
Start: 2021-09-01 — End: 2022-02-03
  Filled 2021-09-01: qty 42.5, 30d supply, fill #0

## 2021-09-02 ENCOUNTER — Other Ambulatory Visit: Payer: Self-pay

## 2021-09-03 ENCOUNTER — Encounter: Payer: Self-pay | Admitting: Oncology

## 2021-09-03 ENCOUNTER — Inpatient Hospital Stay (HOSPITAL_BASED_OUTPATIENT_CLINIC_OR_DEPARTMENT_OTHER): Payer: 59 | Admitting: Oncology

## 2021-09-03 ENCOUNTER — Inpatient Hospital Stay: Payer: 59

## 2021-09-03 ENCOUNTER — Inpatient Hospital Stay: Payer: 59 | Attending: Oncology

## 2021-09-03 ENCOUNTER — Inpatient Hospital Stay: Payer: 59 | Admitting: Occupational Therapy

## 2021-09-03 VITALS — BP 114/74 | HR 81 | Temp 97.7°F | Resp 16 | Wt 141.5 lb

## 2021-09-03 DIAGNOSIS — M79621 Pain in right upper arm: Secondary | ICD-10-CM

## 2021-09-03 DIAGNOSIS — Z5112 Encounter for antineoplastic immunotherapy: Secondary | ICD-10-CM

## 2021-09-03 DIAGNOSIS — Z510 Encounter for antineoplastic radiation therapy: Secondary | ICD-10-CM | POA: Diagnosis not present

## 2021-09-03 DIAGNOSIS — Z79899 Other long term (current) drug therapy: Secondary | ICD-10-CM | POA: Insufficient documentation

## 2021-09-03 DIAGNOSIS — Z170 Estrogen receptor positive status [ER+]: Secondary | ICD-10-CM | POA: Diagnosis not present

## 2021-09-03 DIAGNOSIS — C50411 Malignant neoplasm of upper-outer quadrant of right female breast: Secondary | ICD-10-CM

## 2021-09-03 DIAGNOSIS — Z9189 Other specified personal risk factors, not elsewhere classified: Secondary | ICD-10-CM

## 2021-09-03 DIAGNOSIS — M25611 Stiffness of right shoulder, not elsewhere classified: Secondary | ICD-10-CM

## 2021-09-03 LAB — CBC WITH DIFFERENTIAL/PLATELET
Abs Immature Granulocytes: 0.01 10*3/uL (ref 0.00–0.07)
Basophils Absolute: 0 10*3/uL (ref 0.0–0.1)
Basophils Relative: 0 %
Eosinophils Absolute: 0.2 10*3/uL (ref 0.0–0.5)
Eosinophils Relative: 4 %
HCT: 37.4 % (ref 36.0–46.0)
Hemoglobin: 12.7 g/dL (ref 12.0–15.0)
Immature Granulocytes: 0 %
Lymphocytes Relative: 29 %
Lymphs Abs: 1.2 10*3/uL (ref 0.7–4.0)
MCH: 30.7 pg (ref 26.0–34.0)
MCHC: 34 g/dL (ref 30.0–36.0)
MCV: 90.3 fL (ref 80.0–100.0)
Monocytes Absolute: 0.4 10*3/uL (ref 0.1–1.0)
Monocytes Relative: 9 %
Neutro Abs: 2.3 10*3/uL (ref 1.7–7.7)
Neutrophils Relative %: 58 %
Platelets: 168 10*3/uL (ref 150–400)
RBC: 4.14 MIL/uL (ref 3.87–5.11)
RDW: 13.3 % (ref 11.5–15.5)
WBC: 4 10*3/uL (ref 4.0–10.5)
nRBC: 0 % (ref 0.0–0.2)

## 2021-09-03 LAB — COMPREHENSIVE METABOLIC PANEL
ALT: 23 U/L (ref 0–44)
AST: 27 U/L (ref 15–41)
Albumin: 4.1 g/dL (ref 3.5–5.0)
Alkaline Phosphatase: 105 U/L (ref 38–126)
Anion gap: 8 (ref 5–15)
BUN: 14 mg/dL (ref 8–23)
CO2: 28 mmol/L (ref 22–32)
Calcium: 9.4 mg/dL (ref 8.9–10.3)
Chloride: 103 mmol/L (ref 98–111)
Creatinine, Ser: 0.87 mg/dL (ref 0.44–1.00)
GFR, Estimated: >60 mL/min (ref >60)
Glucose, Bld: 119 mg/dL — ABNORMAL HIGH (ref 70–99)
Potassium: 3.8 mmol/L (ref 3.5–5.1)
Sodium: 139 mmol/L (ref 135–145)
Total Bilirubin: 0.4 mg/dL (ref 0.3–1.2)
Total Protein: 7 g/dL (ref 6.5–8.1)

## 2021-09-03 MED ORDER — SODIUM CHLORIDE 0.9% FLUSH
10.0000 mL | INTRAVENOUS | Status: DC | PRN
  Administered 2021-09-03: 10 mL via INTRAVENOUS
  Filled 2021-09-03: qty 10

## 2021-09-03 MED ORDER — SODIUM CHLORIDE 0.9 % IV SOLN
Freq: Once | INTRAVENOUS | Status: AC
  Filled 2021-09-03: qty 250

## 2021-09-03 MED ORDER — HEPARIN SOD (PORK) LOCK FLUSH 100 UNIT/ML IV SOLN
500.0000 [IU] | Freq: Once | INTRAVENOUS | Status: AC | PRN
  Filled 2021-09-03: qty 5

## 2021-09-03 MED ORDER — SODIUM CHLORIDE 0.9 % IV SOLN
200.0000 mg | Freq: Once | INTRAVENOUS | Status: AC
  Administered 2021-09-03: 200 mg via INTRAVENOUS
  Filled 2021-09-03: qty 8

## 2021-09-03 MED ORDER — HEPARIN SOD (PORK) LOCK FLUSH 100 UNIT/ML IV SOLN
INTRAVENOUS | Status: AC
  Administered 2021-09-03: 500 [IU]
  Filled 2021-09-03: qty 5

## 2021-09-03 MED ORDER — HEPARIN SOD (PORK) LOCK FLUSH 100 UNIT/ML IV SOLN
500.0000 [IU] | Freq: Once | INTRAVENOUS | Status: DC
  Filled 2021-09-03: qty 5
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
## 2021-09-03 NOTE — Patient Instructions (Signed)
[facility]  Discharge Instructions: Thank you for choosing [facility] to provide your oncology and hematology care.  If you have a lab appointment with the Cancer Center, please go directly to the Cancer Center and check in at the registration area.  Wear comfortable clothing and clothing appropriate for easy access to any Portacath or PICC line.   We strive to give you quality time with your provider. You may need to reschedule your appointment if you arrive late (15 or more minutes).  Arriving late affects you and other patients whose appointments are after yours.  Also, if you miss three or more appointments without notifying the office, you may be dismissed from the clinic at the provider's discretion.      For prescription refill requests, have your pharmacy contact our office and allow 72 hours for refills to be completed.    Today you received the following chemotherapy and/or immunotherapy agents Keytruda      To help prevent nausea and vomiting after your treatment, we encourage you to take your nausea medication as directed.  BELOW ARE SYMPTOMS THAT SHOULD BE REPORTED IMMEDIATELY: *FEVER GREATER THAN 100.4 F (38 C) OR HIGHER *CHILLS OR SWEATING *NAUSEA AND VOMITING THAT IS NOT CONTROLLED WITH YOUR NAUSEA MEDICATION *UNUSUAL SHORTNESS OF BREATH *UNUSUAL BRUISING OR BLEEDING *URINARY PROBLEMS (pain or burning when urinating, or frequent urination) *BOWEL PROBLEMS (unusual diarrhea, constipation, pain near the anus) TENDERNESS IN MOUTH AND THROAT WITH OR WITHOUT PRESENCE OF ULCERS (sore throat, sores in mouth, or a toothache) UNUSUAL RASH, SWELLING OR PAIN  UNUSUAL VAGINAL DISCHARGE OR ITCHING   Items with * indicate a potential emergency and should be followed up as soon as possible or go to the Emergency Department if any problems should occur.  Please show the CHEMOTHERAPY ALERT CARD or IMMUNOTHERAPY ALERT CARD at check-in to  the Emergency Department and triage nurse.  Should you have questions after your visit or need to cancel or reschedule your appointment, please contact [facility] [place]  [phone] and follow the prompts.  Office hours are 8:00 a.m. to 4:30 p.m. Monday - Friday. Please note that voicemails left after 4:00 p.m. may not be returned until the following business day.  We are closed weekends and major holidays. You have access to a nurse at all times for urgent questions. Please call the main number to the clinic [phone] and follow the prompts.  For any non-urgent questions, you may also contact your provider using MyChart. We now offer e-Visits for anyone 18 and older to request care online for non-urgent symptoms. For details visit [email or web address].   Also download the MyChart app! Go to the app store, search "MyChart", open the app, select [place], and log in with your MyChart username and password.  Masks are optional in the cancer centers. If you would like for your care team to wear a mask while they are taking care of you, please let them know. For doctor visits, patients may have with them one support person who is at least 62 years old. At this time, visitors are not allowed in the infusion area.   

## 2021-09-03 NOTE — Progress Notes (Signed)
Pt will like Dr. [name] to look over an old diagnosis becuase states it is not acurate and will like it removed. (did not mention the dx).

## 2021-09-04 ENCOUNTER — Ambulatory Visit: Admission: RE | Admit: 2021-09-04 | Payer: 59 | Source: Ambulatory Visit

## 2021-09-04 DIAGNOSIS — Z510 Encounter for antineoplastic radiation therapy: Secondary | ICD-10-CM | POA: Diagnosis not present

## 2021-09-04 DIAGNOSIS — Z79899 Other long term (current) drug therapy: Secondary | ICD-10-CM | POA: Diagnosis not present

## 2021-09-04 DIAGNOSIS — C50411 Malignant neoplasm of upper-outer quadrant of right female breast: Secondary | ICD-10-CM | POA: Diagnosis not present

## 2021-09-04 DIAGNOSIS — Z5112 Encounter for antineoplastic immunotherapy: Secondary | ICD-10-CM | POA: Diagnosis not present

## 2021-09-08 ENCOUNTER — Ambulatory Visit
Admission: RE | Admit: 2021-09-08 | Discharge: 2021-09-08 | Disposition: A | Discharge status: Home / Self Care | Payer: 59 | Source: Ambulatory Visit | Attending: Radiation Oncology | Admitting: Radiation Oncology

## 2021-09-08 ENCOUNTER — Other Ambulatory Visit: Payer: Self-pay

## 2021-09-08 DIAGNOSIS — Z510 Encounter for antineoplastic radiation therapy: Secondary | ICD-10-CM | POA: Diagnosis not present

## 2021-09-08 DIAGNOSIS — Z79899 Other long term (current) drug therapy: Secondary | ICD-10-CM | POA: Diagnosis not present

## 2021-09-08 DIAGNOSIS — C50411 Malignant neoplasm of upper-outer quadrant of right female breast: Secondary | ICD-10-CM | POA: Diagnosis not present

## 2021-09-08 DIAGNOSIS — Z5112 Encounter for antineoplastic immunotherapy: Secondary | ICD-10-CM | POA: Diagnosis not present

## 2021-09-08 LAB — RAD ONC ARIA SESSION SUMMARY
Course Elapsed Days: 0
Plan Fractions Treated to Date: 1
Plan Prescribed Dose Per Fraction: 1.8 Gy
Plan Total Fractions Prescribed: 28
Plan Total Prescribed Dose: 50.4 Gy
Reference Point Dosage Given to Date: 1.8 Gy
Reference Point Session Dosage Given: 1.8 Gy
Session Number: 1

## 2021-09-09 ENCOUNTER — Other Ambulatory Visit: Payer: Self-pay

## 2021-09-09 ENCOUNTER — Ambulatory Visit
Admission: RE | Admit: 2021-09-09 | Discharge: 2021-09-09 | Disposition: A | Discharge status: Home / Self Care | Payer: 59 | Source: Ambulatory Visit | Attending: Radiation Oncology | Admitting: Radiation Oncology

## 2021-09-09 DIAGNOSIS — Z510 Encounter for antineoplastic radiation therapy: Secondary | ICD-10-CM | POA: Diagnosis not present

## 2021-09-09 DIAGNOSIS — Z5112 Encounter for antineoplastic immunotherapy: Secondary | ICD-10-CM | POA: Diagnosis not present

## 2021-09-09 DIAGNOSIS — C50411 Malignant neoplasm of upper-outer quadrant of right female breast: Secondary | ICD-10-CM | POA: Diagnosis not present

## 2021-09-09 DIAGNOSIS — Z79899 Other long term (current) drug therapy: Secondary | ICD-10-CM | POA: Diagnosis not present

## 2021-09-09 LAB — RAD ONC ARIA SESSION SUMMARY
Course Elapsed Days: 1
Plan Fractions Treated to Date: 2
Plan Prescribed Dose Per Fraction: 1.8 Gy
Plan Total Fractions Prescribed: 28
Plan Total Prescribed Dose: 50.4 Gy
Reference Point Dosage Given to Date: 3.6 Gy
Reference Point Session Dosage Given: 1.8 Gy
Session Number: 2

## 2021-09-10 ENCOUNTER — Other Ambulatory Visit: Payer: Self-pay

## 2021-09-10 ENCOUNTER — Ambulatory Visit
Admission: RE | Admit: 2021-09-10 | Discharge: 2021-09-10 | Disposition: A | Discharge status: Home / Self Care | Payer: 59 | Source: Ambulatory Visit | Attending: Radiation Oncology | Admitting: Radiation Oncology

## 2021-09-10 DIAGNOSIS — Z510 Encounter for antineoplastic radiation therapy: Secondary | ICD-10-CM | POA: Diagnosis not present

## 2021-09-10 DIAGNOSIS — Z79899 Other long term (current) drug therapy: Secondary | ICD-10-CM | POA: Diagnosis not present

## 2021-09-10 DIAGNOSIS — Z5112 Encounter for antineoplastic immunotherapy: Secondary | ICD-10-CM | POA: Diagnosis not present

## 2021-09-10 DIAGNOSIS — C50411 Malignant neoplasm of upper-outer quadrant of right female breast: Secondary | ICD-10-CM | POA: Diagnosis not present

## 2021-09-10 LAB — RAD ONC ARIA SESSION SUMMARY
Course Elapsed Days: 2
Plan Fractions Treated to Date: 3
Plan Prescribed Dose Per Fraction: 1.8 Gy
Plan Total Fractions Prescribed: 28
Plan Total Prescribed Dose: 50.4 Gy
Reference Point Dosage Given to Date: 5.4 Gy
Reference Point Session Dosage Given: 1.8 Gy
Session Number: 3

## 2021-09-11 ENCOUNTER — Other Ambulatory Visit: Payer: Self-pay

## 2021-09-11 ENCOUNTER — Ambulatory Visit
Admission: RE | Admit: 2021-09-11 | Discharge: 2021-09-11 | Disposition: A | Discharge status: Home / Self Care | Payer: 59 | Source: Ambulatory Visit | Attending: Radiation Oncology | Admitting: Radiation Oncology

## 2021-09-11 DIAGNOSIS — Z510 Encounter for antineoplastic radiation therapy: Secondary | ICD-10-CM | POA: Diagnosis not present

## 2021-09-11 DIAGNOSIS — Z5112 Encounter for antineoplastic immunotherapy: Secondary | ICD-10-CM | POA: Diagnosis not present

## 2021-09-11 DIAGNOSIS — C50411 Malignant neoplasm of upper-outer quadrant of right female breast: Secondary | ICD-10-CM | POA: Diagnosis not present

## 2021-09-11 DIAGNOSIS — Z79899 Other long term (current) drug therapy: Secondary | ICD-10-CM | POA: Diagnosis not present

## 2021-09-11 LAB — RAD ONC ARIA SESSION SUMMARY
Course Elapsed Days: 3
Plan Fractions Treated to Date: 4
Plan Prescribed Dose Per Fraction: 1.8 Gy
Plan Total Fractions Prescribed: 28
Plan Total Prescribed Dose: 50.4 Gy
Reference Point Dosage Given to Date: 7.2 Gy
Reference Point Session Dosage Given: 1.8 Gy
Session Number: 4

## 2021-09-12 ENCOUNTER — Encounter: Payer: Self-pay | Admitting: Occupational Therapy

## 2021-09-12 ENCOUNTER — Other Ambulatory Visit: Payer: Self-pay

## 2021-09-12 ENCOUNTER — Ambulatory Visit
Admission: RE | Admit: 2021-09-12 | Discharge: 2021-09-12 | Disposition: A | Discharge status: Home / Self Care | Payer: 59 | Source: Ambulatory Visit | Attending: Radiation Oncology | Admitting: Radiation Oncology

## 2021-09-12 DIAGNOSIS — C50411 Malignant neoplasm of upper-outer quadrant of right female breast: Secondary | ICD-10-CM | POA: Diagnosis not present

## 2021-09-12 DIAGNOSIS — Z510 Encounter for antineoplastic radiation therapy: Secondary | ICD-10-CM | POA: Diagnosis not present

## 2021-09-12 DIAGNOSIS — Z79899 Other long term (current) drug therapy: Secondary | ICD-10-CM | POA: Diagnosis not present

## 2021-09-12 DIAGNOSIS — Z5112 Encounter for antineoplastic immunotherapy: Secondary | ICD-10-CM | POA: Diagnosis not present

## 2021-09-12 LAB — RAD ONC ARIA SESSION SUMMARY
Course Elapsed Days: 4
Plan Fractions Treated to Date: 5
Plan Prescribed Dose Per Fraction: 1.8 Gy
Plan Total Fractions Prescribed: 28
Plan Total Prescribed Dose: 50.4 Gy
Reference Point Dosage Given to Date: 9 Gy
Reference Point Session Dosage Given: 1.8 Gy
Session Number: 5

## 2021-09-15 ENCOUNTER — Other Ambulatory Visit: Payer: Self-pay

## 2021-09-15 ENCOUNTER — Ambulatory Visit
Admission: RE | Admit: 2021-09-15 | Discharge: 2021-09-15 | Disposition: A | Discharge status: Home / Self Care | Payer: 59 | Source: Ambulatory Visit | Attending: Radiation Oncology | Admitting: Radiation Oncology

## 2021-09-15 DIAGNOSIS — Z5112 Encounter for antineoplastic immunotherapy: Secondary | ICD-10-CM | POA: Diagnosis not present

## 2021-09-15 DIAGNOSIS — C50411 Malignant neoplasm of upper-outer quadrant of right female breast: Secondary | ICD-10-CM | POA: Diagnosis not present

## 2021-09-15 DIAGNOSIS — Z510 Encounter for antineoplastic radiation therapy: Secondary | ICD-10-CM | POA: Diagnosis not present

## 2021-09-15 DIAGNOSIS — Z79899 Other long term (current) drug therapy: Secondary | ICD-10-CM | POA: Diagnosis not present

## 2021-09-15 LAB — RAD ONC ARIA SESSION SUMMARY
Course Elapsed Days: 7
Plan Fractions Treated to Date: 6
Plan Prescribed Dose Per Fraction: 1.8 Gy
Plan Total Fractions Prescribed: 28
Plan Total Prescribed Dose: 50.4 Gy
Reference Point Dosage Given to Date: 10.8 Gy
Reference Point Session Dosage Given: 1.8 Gy
Session Number: 6

## 2021-09-16 ENCOUNTER — Other Ambulatory Visit: Payer: Self-pay

## 2021-09-16 ENCOUNTER — Ambulatory Visit
Admission: RE | Admit: 2021-09-16 | Discharge: 2021-09-16 | Disposition: A | Discharge status: Home / Self Care | Payer: 59 | Source: Ambulatory Visit | Attending: Radiation Oncology | Admitting: Radiation Oncology

## 2021-09-16 DIAGNOSIS — Z510 Encounter for antineoplastic radiation therapy: Secondary | ICD-10-CM | POA: Diagnosis not present

## 2021-09-16 DIAGNOSIS — C50411 Malignant neoplasm of upper-outer quadrant of right female breast: Secondary | ICD-10-CM | POA: Diagnosis not present

## 2021-09-16 DIAGNOSIS — Z5112 Encounter for antineoplastic immunotherapy: Secondary | ICD-10-CM | POA: Diagnosis not present

## 2021-09-16 DIAGNOSIS — Z79899 Other long term (current) drug therapy: Secondary | ICD-10-CM | POA: Diagnosis not present

## 2021-09-16 LAB — RAD ONC ARIA SESSION SUMMARY
Course Elapsed Days: 8
Plan Fractions Treated to Date: 7
Plan Prescribed Dose Per Fraction: 1.8 Gy
Plan Total Fractions Prescribed: 28
Plan Total Prescribed Dose: 50.4 Gy
Reference Point Dosage Given to Date: 12.6 Gy
Reference Point Session Dosage Given: 1.8 Gy
Session Number: 7

## 2021-09-17 ENCOUNTER — Ambulatory Visit
Admission: RE | Admit: 2021-09-17 | Discharge: 2021-09-17 | Disposition: A | Discharge status: Home / Self Care | Payer: 59 | Source: Ambulatory Visit | Attending: Radiation Oncology | Admitting: Radiation Oncology

## 2021-09-17 ENCOUNTER — Other Ambulatory Visit: Payer: Self-pay

## 2021-09-17 DIAGNOSIS — Z79899 Other long term (current) drug therapy: Secondary | ICD-10-CM | POA: Diagnosis not present

## 2021-09-17 DIAGNOSIS — Z510 Encounter for antineoplastic radiation therapy: Secondary | ICD-10-CM | POA: Diagnosis not present

## 2021-09-17 DIAGNOSIS — C50411 Malignant neoplasm of upper-outer quadrant of right female breast: Secondary | ICD-10-CM | POA: Diagnosis not present

## 2021-09-17 DIAGNOSIS — Z5112 Encounter for antineoplastic immunotherapy: Secondary | ICD-10-CM | POA: Diagnosis not present

## 2021-09-17 LAB — RAD ONC ARIA SESSION SUMMARY
Course Elapsed Days: 9
Plan Fractions Treated to Date: 8
Plan Prescribed Dose Per Fraction: 1.8 Gy
Plan Total Fractions Prescribed: 28
Plan Total Prescribed Dose: 50.4 Gy
Reference Point Dosage Given to Date: 14.4 Gy
Reference Point Session Dosage Given: 1.8 Gy
Session Number: 8

## 2021-09-18 ENCOUNTER — Inpatient Hospital Stay: Payer: 59

## 2021-09-18 ENCOUNTER — Other Ambulatory Visit: Payer: Self-pay

## 2021-09-18 ENCOUNTER — Ambulatory Visit
Admission: RE | Admit: 2021-09-18 | Discharge: 2021-09-18 | Disposition: A | Discharge status: Home / Self Care | Payer: 59 | Source: Ambulatory Visit | Attending: Radiation Oncology | Admitting: Radiation Oncology

## 2021-09-18 DIAGNOSIS — C50411 Malignant neoplasm of upper-outer quadrant of right female breast: Secondary | ICD-10-CM | POA: Diagnosis not present

## 2021-09-18 DIAGNOSIS — Z79899 Other long term (current) drug therapy: Secondary | ICD-10-CM | POA: Diagnosis not present

## 2021-09-18 DIAGNOSIS — Z170 Estrogen receptor positive status [ER+]: Secondary | ICD-10-CM

## 2021-09-18 DIAGNOSIS — Z510 Encounter for antineoplastic radiation therapy: Secondary | ICD-10-CM | POA: Diagnosis not present

## 2021-09-18 DIAGNOSIS — Z5112 Encounter for antineoplastic immunotherapy: Secondary | ICD-10-CM | POA: Diagnosis not present

## 2021-09-18 LAB — CBC
HCT: 38.9 % (ref 36.0–46.0)
Hemoglobin: 13.1 g/dL (ref 12.0–15.0)
MCH: 30.7 pg (ref 26.0–34.0)
MCHC: 33.7 g/dL (ref 30.0–36.0)
MCV: 91.1 fL (ref 80.0–100.0)
Platelets: 193 10*3/uL (ref 150–400)
RBC: 4.27 MIL/uL (ref 3.87–5.11)
RDW: 14.1 % (ref 11.5–15.5)
WBC: 5.3 10*3/uL (ref 4.0–10.5)
nRBC: 0 % (ref 0.0–0.2)

## 2021-09-18 LAB — RAD ONC ARIA SESSION SUMMARY
Course Elapsed Days: 10
Plan Fractions Treated to Date: 9
Plan Prescribed Dose Per Fraction: 1.8 Gy
Plan Total Fractions Prescribed: 28
Plan Total Prescribed Dose: 50.4 Gy
Reference Point Dosage Given to Date: 16.2 Gy
Reference Point Session Dosage Given: 1.8 Gy
Session Number: 9

## 2021-09-19 ENCOUNTER — Other Ambulatory Visit: Payer: Self-pay

## 2021-09-19 ENCOUNTER — Ambulatory Visit
Admission: RE | Admit: 2021-09-19 | Discharge: 2021-09-19 | Disposition: A | Discharge status: Home / Self Care | Payer: 59 | Source: Ambulatory Visit | Attending: Radiation Oncology | Admitting: Radiation Oncology

## 2021-09-19 DIAGNOSIS — Z79899 Other long term (current) drug therapy: Secondary | ICD-10-CM | POA: Diagnosis not present

## 2021-09-19 DIAGNOSIS — Z5112 Encounter for antineoplastic immunotherapy: Secondary | ICD-10-CM | POA: Diagnosis not present

## 2021-09-19 DIAGNOSIS — Z510 Encounter for antineoplastic radiation therapy: Secondary | ICD-10-CM | POA: Diagnosis not present

## 2021-09-19 DIAGNOSIS — C50411 Malignant neoplasm of upper-outer quadrant of right female breast: Secondary | ICD-10-CM | POA: Diagnosis not present

## 2021-09-19 LAB — RAD ONC ARIA SESSION SUMMARY
Course Elapsed Days: 11
Plan Fractions Treated to Date: 10
Plan Prescribed Dose Per Fraction: 1.8 Gy
Plan Total Fractions Prescribed: 28
Plan Total Prescribed Dose: 50.4 Gy
Reference Point Dosage Given to Date: 18 Gy
Reference Point Session Dosage Given: 1.8 Gy
Session Number: 10

## 2021-09-22 ENCOUNTER — Ambulatory Visit
Admission: RE | Admit: 2021-09-22 | Discharge: 2021-09-22 | Disposition: A | Discharge status: Home / Self Care | Payer: 59 | Source: Ambulatory Visit | Attending: Radiation Oncology | Admitting: Radiation Oncology

## 2021-09-22 ENCOUNTER — Other Ambulatory Visit: Payer: Self-pay

## 2021-09-22 DIAGNOSIS — Z510 Encounter for antineoplastic radiation therapy: Secondary | ICD-10-CM | POA: Diagnosis not present

## 2021-09-22 DIAGNOSIS — C50411 Malignant neoplasm of upper-outer quadrant of right female breast: Secondary | ICD-10-CM | POA: Diagnosis not present

## 2021-09-22 DIAGNOSIS — Z79899 Other long term (current) drug therapy: Secondary | ICD-10-CM | POA: Diagnosis not present

## 2021-09-22 DIAGNOSIS — Z5112 Encounter for antineoplastic immunotherapy: Secondary | ICD-10-CM | POA: Diagnosis not present

## 2021-09-22 LAB — RAD ONC ARIA SESSION SUMMARY
Course Elapsed Days: 14
Plan Fractions Treated to Date: 11
Plan Prescribed Dose Per Fraction: 1.8 Gy
Plan Total Fractions Prescribed: 28
Plan Total Prescribed Dose: 50.4 Gy
Reference Point Dosage Given to Date: 19.8 Gy
Reference Point Session Dosage Given: 1.8 Gy
Session Number: 11

## 2021-09-23 ENCOUNTER — Other Ambulatory Visit: Payer: Self-pay

## 2021-09-23 ENCOUNTER — Ambulatory Visit
Admission: RE | Admit: 2021-09-23 | Discharge: 2021-09-23 | Disposition: A | Discharge status: Home / Self Care | Payer: 59 | Source: Ambulatory Visit | Attending: Oncology | Admitting: Oncology

## 2021-09-23 DIAGNOSIS — I890 Lymphedema, not elsewhere classified: Secondary | ICD-10-CM | POA: Insufficient documentation

## 2021-09-23 DIAGNOSIS — Z510 Encounter for antineoplastic radiation therapy: Secondary | ICD-10-CM | POA: Diagnosis not present

## 2021-09-23 DIAGNOSIS — C50411 Malignant neoplasm of upper-outer quadrant of right female breast: Secondary | ICD-10-CM | POA: Diagnosis not present

## 2021-09-23 DIAGNOSIS — Z170 Estrogen receptor positive status [ER+]: Secondary | ICD-10-CM | POA: Diagnosis not present

## 2021-09-23 LAB — RAD ONC ARIA SESSION SUMMARY
Course Elapsed Days: 15
Plan Fractions Treated to Date: 12
Plan Prescribed Dose Per Fraction: 1.8 Gy
Plan Total Fractions Prescribed: 28
Plan Total Prescribed Dose: 50.4 Gy
Reference Point Dosage Given to Date: 21.6 Gy
Reference Point Session Dosage Given: 1.8 Gy
Session Number: 12

## 2021-09-24 ENCOUNTER — Inpatient Hospital Stay: Payer: 59

## 2021-09-24 ENCOUNTER — Ambulatory Visit
Admission: RE | Admit: 2021-09-24 | Discharge: 2021-09-24 | Disposition: A | Discharge status: Home / Self Care | Payer: 59 | Source: Ambulatory Visit | Attending: Radiation Oncology | Admitting: Radiation Oncology

## 2021-09-24 ENCOUNTER — Other Ambulatory Visit: Payer: Self-pay

## 2021-09-24 ENCOUNTER — Inpatient Hospital Stay (HOSPITAL_BASED_OUTPATIENT_CLINIC_OR_DEPARTMENT_OTHER): Payer: 59 | Admitting: Oncology

## 2021-09-24 ENCOUNTER — Encounter: Payer: Self-pay | Admitting: Oncology

## 2021-09-24 VITALS — BP 108/70 | HR 76 | Temp 96.7°F | Resp 16 | Wt 139.4 lb

## 2021-09-24 DIAGNOSIS — I890 Lymphedema, not elsewhere classified: Secondary | ICD-10-CM | POA: Diagnosis not present

## 2021-09-24 DIAGNOSIS — C50411 Malignant neoplasm of upper-outer quadrant of right female breast: Secondary | ICD-10-CM

## 2021-09-24 DIAGNOSIS — Z170 Estrogen receptor positive status [ER+]: Secondary | ICD-10-CM

## 2021-09-24 DIAGNOSIS — Z79899 Other long term (current) drug therapy: Secondary | ICD-10-CM | POA: Insufficient documentation

## 2021-09-24 DIAGNOSIS — M79601 Pain in right arm: Secondary | ICD-10-CM | POA: Insufficient documentation

## 2021-09-24 DIAGNOSIS — Z5112 Encounter for antineoplastic immunotherapy: Secondary | ICD-10-CM | POA: Insufficient documentation

## 2021-09-24 DIAGNOSIS — M25611 Stiffness of right shoulder, not elsewhere classified: Secondary | ICD-10-CM | POA: Insufficient documentation

## 2021-09-24 DIAGNOSIS — Z510 Encounter for antineoplastic radiation therapy: Secondary | ICD-10-CM | POA: Diagnosis not present

## 2021-09-24 LAB — CBC WITH DIFFERENTIAL/PLATELET
Abs Immature Granulocytes: 0.01 10*3/uL (ref 0.00–0.07)
Basophils Absolute: 0 10*3/uL (ref 0.0–0.1)
Basophils Relative: 0 %
Eosinophils Absolute: 0.1 10*3/uL (ref 0.0–0.5)
Eosinophils Relative: 3 %
HCT: 39.1 % (ref 36.0–46.0)
Hemoglobin: 13.4 g/dL (ref 12.0–15.0)
Immature Granulocytes: 0 %
Lymphocytes Relative: 18 %
Lymphs Abs: 0.8 10*3/uL (ref 0.7–4.0)
MCH: 30.7 pg (ref 26.0–34.0)
MCHC: 34.3 g/dL (ref 30.0–36.0)
MCV: 89.5 fL (ref 80.0–100.0)
Monocytes Absolute: 0.4 10*3/uL (ref 0.1–1.0)
Monocytes Relative: 10 %
Neutro Abs: 2.9 10*3/uL (ref 1.7–7.7)
Neutrophils Relative %: 69 %
Platelets: 180 10*3/uL (ref 150–400)
RBC: 4.37 MIL/uL (ref 3.87–5.11)
RDW: 13.9 % (ref 11.5–15.5)
WBC: 4.2 10*3/uL (ref 4.0–10.5)
nRBC: 0 % (ref 0.0–0.2)

## 2021-09-24 LAB — COMPREHENSIVE METABOLIC PANEL
ALT: 21 U/L (ref 0–44)
AST: 26 U/L (ref 15–41)
Albumin: 4.2 g/dL (ref 3.5–5.0)
Alkaline Phosphatase: 84 U/L (ref 38–126)
Anion gap: 7 (ref 5–15)
BUN: 12 mg/dL (ref 8–23)
CO2: 28 mmol/L (ref 22–32)
Calcium: 9.5 mg/dL (ref 8.9–10.3)
Chloride: 103 mmol/L (ref 98–111)
Creatinine, Ser: 0.8 mg/dL (ref 0.44–1.00)
GFR, Estimated: >60 mL/min (ref >60)
Glucose, Bld: 96 mg/dL (ref 70–99)
Potassium: 3.9 mmol/L (ref 3.5–5.1)
Sodium: 138 mmol/L (ref 135–145)
Total Bilirubin: 0.4 mg/dL (ref 0.3–1.2)
Total Protein: 7.2 g/dL (ref 6.5–8.1)

## 2021-09-24 LAB — RAD ONC ARIA SESSION SUMMARY
Course Elapsed Days: 16
Plan Fractions Treated to Date: 13
Plan Prescribed Dose Per Fraction: 1.8 Gy
Plan Total Fractions Prescribed: 28
Plan Total Prescribed Dose: 50.4 Gy
Reference Point Dosage Given to Date: 23.4 Gy
Reference Point Session Dosage Given: 1.8 Gy
Session Number: 13

## 2021-09-24 LAB — TSH: TSH: 3.207 u[IU]/mL (ref 0.350–4.500)

## 2021-09-24 MED ORDER — HEPARIN SOD (PORK) LOCK FLUSH 100 UNIT/ML IV SOLN
INTRAVENOUS | Status: AC
  Filled 2021-09-24: qty 5

## 2021-09-24 MED ORDER — SODIUM CHLORIDE 0.9 % IV SOLN
Freq: Once | INTRAVENOUS | Status: AC
  Filled 2021-09-24: qty 250

## 2021-09-24 MED ORDER — HEPARIN SOD (PORK) LOCK FLUSH 100 UNIT/ML IV SOLN
500.0000 [IU] | Freq: Once | INTRAVENOUS | Status: AC | PRN
  Administered 2021-09-24: 500 [IU]
  Filled 2021-09-24: qty 5

## 2021-09-24 MED ORDER — SODIUM CHLORIDE 0.9% FLUSH
10.0000 mL | Freq: Once | INTRAVENOUS | Status: AC
  Administered 2021-09-24: 10 mL via INTRAVENOUS
  Filled 2021-09-24: qty 10

## 2021-09-24 MED ORDER — SODIUM CHLORIDE 0.9 % IV SOLN
200.0000 mg | Freq: Once | INTRAVENOUS | Status: AC
  Administered 2021-09-24: 200 mg via INTRAVENOUS
  Filled 2021-09-24: qty 8

## 2021-09-24 NOTE — Patient Instructions (Signed)
[facility] AT [place]  Discharge Instructions: Thank you for choosing [facility] to provide your oncology and hematology care.  If you have a lab appointment with the [facility], please go directly to the [facility] and check in at the registration area.  Wear comfortable clothing and clothing appropriate for easy access to any Portacath or PICC line.   We strive to give you quality time with your provider. You may need to reschedule your appointment if you arrive late (15 or more minutes).  Arriving late affects you and other patients whose appointments are after yours.  Also, if you miss three or more appointments without notifying the office, you may be dismissed from the clinic at the provider's discretion.      For prescription refill requests, have your pharmacy contact our office and allow 72 hours for refills to be completed.    Today you received the following chemotherapy and/or immunotherapy agents Beryle Flock   To help prevent nausea and vomiting after your treatment, we encourage you to take your nausea medication as directed.  BELOW ARE SYMPTOMS THAT SHOULD BE REPORTED IMMEDIATELY: *FEVER GREATER THAN 100.4 F (38 C) OR HIGHER *CHILLS OR SWEATING *NAUSEA AND VOMITING THAT IS NOT CONTROLLED WITH YOUR NAUSEA MEDICATION *UNUSUAL SHORTNESS OF BREATH *UNUSUAL BRUISING OR BLEEDING *URINARY PROBLEMS (pain or burning when urinating, or frequent urination) *BOWEL PROBLEMS (unusual diarrhea, constipation, pain near the anus) TENDERNESS IN MOUTH AND THROAT WITH OR WITHOUT PRESENCE OF ULCERS (sore throat, sores in mouth, or a toothache) UNUSUAL RASH, SWELLING OR PAIN  UNUSUAL VAGINAL DISCHARGE OR ITCHING   Items with * indicate a potential emergency and should be followed up as soon as possible or go to the Emergency Department if any problems should occur.  Please show the CHEMOTHERAPY ALERT CARD or IMMUNOTHERAPY ALERT CARD at check-in to the  Emergency Department and triage nurse.  Should you have questions after your visit or need to cancel or reschedule your appointment, please contact [facility] [place] [facility] [place]  [phone] and follow the prompts.  Office hours are 8:00 a.m. to 4:30 p.m. Monday - Friday. Please note that voicemails left after 4:00 p.m. may not be returned until the following business day.  We are closed weekends and major holidays. You have access to a nurse at all times for urgent questions. Please call the main number to the clinic [phone] and follow the prompts.  For any non-urgent questions, you may also contact your provider using MyChart. We now offer e-Visits for anyone 25 and older to request care online for non-urgent symptoms. For details visit [email or web address].   Also download the MyChart app! Go to the app store, search "MyChart", open the app, select [place], and log in with your MyChart username and password.  Masks are optional in the cancer centers. If you would like for your care team to wear a mask while they are taking care of you, please let them know. For doctor visits, patients may have with them one support person who is at least 62 years old. At this time, visitors are not allowed in the infusion area.
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
## 2021-09-25 ENCOUNTER — Ambulatory Visit
Admission: RE | Admit: 2021-09-25 | Discharge: 2021-09-25 | Disposition: A | Discharge status: Home / Self Care | Payer: 59 | Source: Ambulatory Visit | Attending: Radiation Oncology | Admitting: Radiation Oncology

## 2021-09-25 ENCOUNTER — Other Ambulatory Visit: Payer: Self-pay

## 2021-09-25 DIAGNOSIS — Z510 Encounter for antineoplastic radiation therapy: Secondary | ICD-10-CM | POA: Diagnosis not present

## 2021-09-25 DIAGNOSIS — I890 Lymphedema, not elsewhere classified: Secondary | ICD-10-CM | POA: Diagnosis not present

## 2021-09-25 DIAGNOSIS — C50411 Malignant neoplasm of upper-outer quadrant of right female breast: Secondary | ICD-10-CM | POA: Diagnosis not present

## 2021-09-25 DIAGNOSIS — Z170 Estrogen receptor positive status [ER+]: Secondary | ICD-10-CM | POA: Diagnosis not present

## 2021-09-25 LAB — RAD ONC ARIA SESSION SUMMARY
Course Elapsed Days: 17
Plan Fractions Treated to Date: 14
Plan Prescribed Dose Per Fraction: 1.8 Gy
Plan Total Fractions Prescribed: 28
Plan Total Prescribed Dose: 50.4 Gy
Reference Point Dosage Given to Date: 25.2 Gy
Reference Point Session Dosage Given: 1.8 Gy
Session Number: 14

## 2021-09-26 ENCOUNTER — Ambulatory Visit
Admission: RE | Admit: 2021-09-26 | Discharge: 2021-09-26 | Disposition: A | Discharge status: Home / Self Care | Payer: 59 | Source: Ambulatory Visit | Attending: Radiation Oncology | Admitting: Radiation Oncology

## 2021-09-26 ENCOUNTER — Other Ambulatory Visit: Payer: Self-pay

## 2021-09-26 DIAGNOSIS — I890 Lymphedema, not elsewhere classified: Secondary | ICD-10-CM | POA: Diagnosis not present

## 2021-09-26 DIAGNOSIS — Z510 Encounter for antineoplastic radiation therapy: Secondary | ICD-10-CM | POA: Diagnosis not present

## 2021-09-26 DIAGNOSIS — Z170 Estrogen receptor positive status [ER+]: Secondary | ICD-10-CM | POA: Diagnosis not present

## 2021-09-26 DIAGNOSIS — C50411 Malignant neoplasm of upper-outer quadrant of right female breast: Secondary | ICD-10-CM | POA: Diagnosis not present

## 2021-09-26 LAB — RAD ONC ARIA SESSION SUMMARY
Course Elapsed Days: 18
Plan Fractions Treated to Date: 15
Plan Prescribed Dose Per Fraction: 1.8 Gy
Plan Total Fractions Prescribed: 28
Plan Total Prescribed Dose: 50.4 Gy
Reference Point Dosage Given to Date: 27 Gy
Reference Point Session Dosage Given: 1.8 Gy
Session Number: 15

## 2021-09-29 ENCOUNTER — Other Ambulatory Visit: Payer: Self-pay

## 2021-09-29 ENCOUNTER — Ambulatory Visit
Admission: RE | Admit: 2021-09-29 | Discharge: 2021-09-29 | Disposition: A | Discharge status: Home / Self Care | Payer: 59 | Source: Ambulatory Visit | Attending: Radiation Oncology | Admitting: Radiation Oncology

## 2021-09-29 DIAGNOSIS — I890 Lymphedema, not elsewhere classified: Secondary | ICD-10-CM | POA: Diagnosis not present

## 2021-09-29 DIAGNOSIS — Z510 Encounter for antineoplastic radiation therapy: Secondary | ICD-10-CM | POA: Diagnosis not present

## 2021-09-29 DIAGNOSIS — Z170 Estrogen receptor positive status [ER+]: Secondary | ICD-10-CM | POA: Diagnosis not present

## 2021-09-29 DIAGNOSIS — C50411 Malignant neoplasm of upper-outer quadrant of right female breast: Secondary | ICD-10-CM | POA: Diagnosis not present

## 2021-09-29 LAB — RAD ONC ARIA SESSION SUMMARY
Course Elapsed Days: 21
Plan Fractions Treated to Date: 16
Plan Prescribed Dose Per Fraction: 1.8 Gy
Plan Total Fractions Prescribed: 28
Plan Total Prescribed Dose: 50.4 Gy
Reference Point Dosage Given to Date: 28.8 Gy
Reference Point Session Dosage Given: 1.8 Gy
Session Number: 16

## 2021-09-30 ENCOUNTER — Ambulatory Visit
Admission: RE | Admit: 2021-09-30 | Discharge: 2021-09-30 | Disposition: A | Discharge status: Home / Self Care | Payer: 59 | Source: Ambulatory Visit | Attending: Radiation Oncology | Admitting: Radiation Oncology

## 2021-09-30 ENCOUNTER — Other Ambulatory Visit: Payer: Self-pay

## 2021-09-30 DIAGNOSIS — C50411 Malignant neoplasm of upper-outer quadrant of right female breast: Secondary | ICD-10-CM | POA: Diagnosis not present

## 2021-09-30 DIAGNOSIS — Z170 Estrogen receptor positive status [ER+]: Secondary | ICD-10-CM | POA: Diagnosis not present

## 2021-09-30 DIAGNOSIS — I890 Lymphedema, not elsewhere classified: Secondary | ICD-10-CM | POA: Diagnosis not present

## 2021-09-30 DIAGNOSIS — Z510 Encounter for antineoplastic radiation therapy: Secondary | ICD-10-CM | POA: Diagnosis not present

## 2021-09-30 LAB — RAD ONC ARIA SESSION SUMMARY
Course Elapsed Days: 22
Plan Fractions Treated to Date: 17
Plan Prescribed Dose Per Fraction: 1.8 Gy
Plan Total Fractions Prescribed: 28
Plan Total Prescribed Dose: 50.4 Gy
Reference Point Dosage Given to Date: 30.6 Gy
Reference Point Session Dosage Given: 1.8 Gy
Session Number: 17

## 2021-10-01 ENCOUNTER — Other Ambulatory Visit: Payer: Self-pay

## 2021-10-01 ENCOUNTER — Ambulatory Visit
Admission: RE | Admit: 2021-10-01 | Discharge: 2021-10-01 | Disposition: A | Discharge status: Home / Self Care | Payer: 59 | Source: Ambulatory Visit | Attending: Radiation Oncology | Admitting: Radiation Oncology

## 2021-10-01 ENCOUNTER — Inpatient Hospital Stay: Payer: 59 | Admitting: Occupational Therapy

## 2021-10-01 DIAGNOSIS — Z510 Encounter for antineoplastic radiation therapy: Secondary | ICD-10-CM | POA: Diagnosis not present

## 2021-10-01 DIAGNOSIS — C50411 Malignant neoplasm of upper-outer quadrant of right female breast: Secondary | ICD-10-CM | POA: Diagnosis not present

## 2021-10-01 DIAGNOSIS — Z170 Estrogen receptor positive status [ER+]: Secondary | ICD-10-CM | POA: Diagnosis not present

## 2021-10-01 DIAGNOSIS — I890 Lymphedema, not elsewhere classified: Secondary | ICD-10-CM | POA: Diagnosis not present

## 2021-10-01 DIAGNOSIS — Z9189 Other specified personal risk factors, not elsewhere classified: Secondary | ICD-10-CM

## 2021-10-01 DIAGNOSIS — M79621 Pain in right upper arm: Secondary | ICD-10-CM

## 2021-10-01 DIAGNOSIS — M25611 Stiffness of right shoulder, not elsewhere classified: Secondary | ICD-10-CM

## 2021-10-01 LAB — RAD ONC ARIA SESSION SUMMARY
Course Elapsed Days: 23
Plan Fractions Treated to Date: 18
Plan Prescribed Dose Per Fraction: 1.8 Gy
Plan Total Fractions Prescribed: 28
Plan Total Prescribed Dose: 50.4 Gy
Reference Point Dosage Given to Date: 32.4 Gy
Reference Point Session Dosage Given: 1.8 Gy
Session Number: 18
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
## 2021-10-02 ENCOUNTER — Ambulatory Visit
Admission: RE | Admit: 2021-10-02 | Discharge: 2021-10-02 | Disposition: A | Discharge status: Home / Self Care | Payer: 59 | Source: Ambulatory Visit | Attending: Radiation Oncology | Admitting: Radiation Oncology

## 2021-10-02 ENCOUNTER — Other Ambulatory Visit: Payer: Self-pay

## 2021-10-02 ENCOUNTER — Inpatient Hospital Stay: Payer: 59

## 2021-10-02 DIAGNOSIS — Z170 Estrogen receptor positive status [ER+]: Secondary | ICD-10-CM | POA: Diagnosis not present

## 2021-10-02 DIAGNOSIS — I890 Lymphedema, not elsewhere classified: Secondary | ICD-10-CM | POA: Diagnosis not present

## 2021-10-02 DIAGNOSIS — Z510 Encounter for antineoplastic radiation therapy: Secondary | ICD-10-CM | POA: Diagnosis not present

## 2021-10-02 DIAGNOSIS — C50411 Malignant neoplasm of upper-outer quadrant of right female breast: Secondary | ICD-10-CM

## 2021-10-02 LAB — CBC
HCT: 39.6 % (ref 36.0–46.0)
Hemoglobin: 13.4 g/dL (ref 12.0–15.0)
MCH: 30.6 pg (ref 26.0–34.0)
MCHC: 33.8 g/dL (ref 30.0–36.0)
MCV: 90.4 fL (ref 80.0–100.0)
Platelets: 183 10*3/uL (ref 150–400)
RBC: 4.38 MIL/uL (ref 3.87–5.11)
RDW: 13.8 % (ref 11.5–15.5)
WBC: 4.5 10*3/uL (ref 4.0–10.5)
nRBC: 0 % (ref 0.0–0.2)

## 2021-10-02 LAB — RAD ONC ARIA SESSION SUMMARY
Course Elapsed Days: 24
Plan Fractions Treated to Date: 19
Plan Prescribed Dose Per Fraction: 1.8 Gy
Plan Total Fractions Prescribed: 28
Plan Total Prescribed Dose: 50.4 Gy
Reference Point Dosage Given to Date: 34.2 Gy
Reference Point Session Dosage Given: 1.8 Gy
Session Number: 19

## 2021-10-03 ENCOUNTER — Ambulatory Visit
Admission: RE | Admit: 2021-10-03 | Discharge: 2021-10-03 | Disposition: A | Discharge status: Home / Self Care | Payer: 59 | Source: Ambulatory Visit | Attending: Radiation Oncology | Admitting: Radiation Oncology

## 2021-10-03 ENCOUNTER — Other Ambulatory Visit: Payer: Self-pay

## 2021-10-03 DIAGNOSIS — Z170 Estrogen receptor positive status [ER+]: Secondary | ICD-10-CM | POA: Diagnosis not present

## 2021-10-03 DIAGNOSIS — I890 Lymphedema, not elsewhere classified: Secondary | ICD-10-CM | POA: Diagnosis not present

## 2021-10-03 DIAGNOSIS — C50411 Malignant neoplasm of upper-outer quadrant of right female breast: Secondary | ICD-10-CM | POA: Diagnosis not present

## 2021-10-03 DIAGNOSIS — Z510 Encounter for antineoplastic radiation therapy: Secondary | ICD-10-CM | POA: Diagnosis not present

## 2021-10-03 LAB — RAD ONC ARIA SESSION SUMMARY
Course Elapsed Days: 25
Plan Fractions Treated to Date: 20
Plan Prescribed Dose Per Fraction: 1.8 Gy
Plan Total Fractions Prescribed: 28
Plan Total Prescribed Dose: 50.4 Gy
Reference Point Dosage Given to Date: 36 Gy
Reference Point Session Dosage Given: 1.8 Gy
Session Number: 20

## 2021-10-06 ENCOUNTER — Ambulatory Visit
Admission: RE | Admit: 2021-10-06 | Discharge: 2021-10-06 | Disposition: A | Discharge status: Home / Self Care | Payer: 59 | Source: Ambulatory Visit | Attending: Radiation Oncology | Admitting: Radiation Oncology

## 2021-10-06 ENCOUNTER — Other Ambulatory Visit: Payer: Self-pay

## 2021-10-06 DIAGNOSIS — Z170 Estrogen receptor positive status [ER+]: Secondary | ICD-10-CM | POA: Diagnosis not present

## 2021-10-06 DIAGNOSIS — I890 Lymphedema, not elsewhere classified: Secondary | ICD-10-CM | POA: Diagnosis not present

## 2021-10-06 DIAGNOSIS — Z510 Encounter for antineoplastic radiation therapy: Secondary | ICD-10-CM | POA: Diagnosis not present

## 2021-10-06 DIAGNOSIS — C50411 Malignant neoplasm of upper-outer quadrant of right female breast: Secondary | ICD-10-CM | POA: Diagnosis not present

## 2021-10-06 LAB — RAD ONC ARIA SESSION SUMMARY
Course Elapsed Days: 28
Plan Fractions Treated to Date: 21
Plan Prescribed Dose Per Fraction: 1.8 Gy
Plan Total Fractions Prescribed: 28
Plan Total Prescribed Dose: 50.4 Gy
Reference Point Dosage Given to Date: 37.8 Gy
Reference Point Session Dosage Given: 1.8 Gy
Session Number: 21

## 2021-10-07 ENCOUNTER — Ambulatory Visit: Payer: 59

## 2021-10-07 ENCOUNTER — Ambulatory Visit
Admission: RE | Admit: 2021-10-07 | Discharge: 2021-10-07 | Disposition: A | Discharge status: Home / Self Care | Payer: 59 | Source: Ambulatory Visit | Attending: Radiation Oncology | Admitting: Radiation Oncology

## 2021-10-07 ENCOUNTER — Other Ambulatory Visit: Payer: Self-pay

## 2021-10-07 DIAGNOSIS — Z170 Estrogen receptor positive status [ER+]: Secondary | ICD-10-CM | POA: Diagnosis not present

## 2021-10-07 DIAGNOSIS — I890 Lymphedema, not elsewhere classified: Secondary | ICD-10-CM | POA: Diagnosis not present

## 2021-10-07 DIAGNOSIS — C50411 Malignant neoplasm of upper-outer quadrant of right female breast: Secondary | ICD-10-CM | POA: Diagnosis not present

## 2021-10-07 DIAGNOSIS — Z510 Encounter for antineoplastic radiation therapy: Secondary | ICD-10-CM | POA: Diagnosis not present

## 2021-10-07 LAB — RAD ONC ARIA SESSION SUMMARY
Course Elapsed Days: 29
Plan Fractions Treated to Date: 22
Plan Prescribed Dose Per Fraction: 1.8 Gy
Plan Total Fractions Prescribed: 28
Plan Total Prescribed Dose: 50.4 Gy
Reference Point Dosage Given to Date: 39.6 Gy
Reference Point Session Dosage Given: 1.8 Gy
Session Number: 22

## 2021-10-08 ENCOUNTER — Other Ambulatory Visit: Payer: Self-pay

## 2021-10-08 ENCOUNTER — Ambulatory Visit
Admission: RE | Admit: 2021-10-08 | Discharge: 2021-10-08 | Disposition: A | Discharge status: Home / Self Care | Payer: 59 | Source: Ambulatory Visit | Attending: Radiation Oncology | Admitting: Radiation Oncology

## 2021-10-08 DIAGNOSIS — I890 Lymphedema, not elsewhere classified: Secondary | ICD-10-CM | POA: Diagnosis not present

## 2021-10-08 DIAGNOSIS — Z510 Encounter for antineoplastic radiation therapy: Secondary | ICD-10-CM | POA: Diagnosis not present

## 2021-10-08 DIAGNOSIS — C50411 Malignant neoplasm of upper-outer quadrant of right female breast: Secondary | ICD-10-CM | POA: Diagnosis not present

## 2021-10-08 DIAGNOSIS — Z170 Estrogen receptor positive status [ER+]: Secondary | ICD-10-CM | POA: Diagnosis not present

## 2021-10-08 LAB — RAD ONC ARIA SESSION SUMMARY
Course Elapsed Days: 30
Plan Fractions Treated to Date: 23
Plan Prescribed Dose Per Fraction: 1.8 Gy
Plan Total Fractions Prescribed: 28
Plan Total Prescribed Dose: 50.4 Gy
Reference Point Dosage Given to Date: 41.4 Gy
Reference Point Session Dosage Given: 1.8 Gy
Session Number: 23

## 2021-10-09 ENCOUNTER — Ambulatory Visit
Admission: RE | Admit: 2021-10-09 | Discharge: 2021-10-09 | Disposition: A | Discharge status: Home / Self Care | Payer: 59 | Source: Ambulatory Visit | Attending: Radiation Oncology | Admitting: Radiation Oncology

## 2021-10-09 ENCOUNTER — Other Ambulatory Visit: Payer: Self-pay

## 2021-10-09 DIAGNOSIS — Z170 Estrogen receptor positive status [ER+]: Secondary | ICD-10-CM | POA: Diagnosis not present

## 2021-10-09 DIAGNOSIS — C50411 Malignant neoplasm of upper-outer quadrant of right female breast: Secondary | ICD-10-CM | POA: Diagnosis not present

## 2021-10-09 DIAGNOSIS — Z510 Encounter for antineoplastic radiation therapy: Secondary | ICD-10-CM | POA: Diagnosis not present

## 2021-10-09 DIAGNOSIS — I890 Lymphedema, not elsewhere classified: Secondary | ICD-10-CM | POA: Diagnosis not present

## 2021-10-09 LAB — RAD ONC ARIA SESSION SUMMARY
Course Elapsed Days: 31
Plan Fractions Treated to Date: 24
Plan Prescribed Dose Per Fraction: 1.8 Gy
Plan Total Fractions Prescribed: 28
Plan Total Prescribed Dose: 50.4 Gy
Reference Point Dosage Given to Date: 43.2 Gy
Reference Point Session Dosage Given: 1.8 Gy
Session Number: 24

## 2021-10-10 ENCOUNTER — Other Ambulatory Visit: Payer: Self-pay

## 2021-10-10 ENCOUNTER — Ambulatory Visit
Admission: RE | Admit: 2021-10-10 | Discharge: 2021-10-10 | Disposition: A | Discharge status: Home / Self Care | Payer: 59 | Source: Ambulatory Visit | Attending: Radiation Oncology | Admitting: Radiation Oncology

## 2021-10-10 DIAGNOSIS — I890 Lymphedema, not elsewhere classified: Secondary | ICD-10-CM | POA: Diagnosis not present

## 2021-10-10 DIAGNOSIS — Z510 Encounter for antineoplastic radiation therapy: Secondary | ICD-10-CM | POA: Diagnosis not present

## 2021-10-10 DIAGNOSIS — C50411 Malignant neoplasm of upper-outer quadrant of right female breast: Secondary | ICD-10-CM | POA: Diagnosis not present

## 2021-10-10 DIAGNOSIS — Z170 Estrogen receptor positive status [ER+]: Secondary | ICD-10-CM | POA: Diagnosis not present

## 2021-10-10 LAB — RAD ONC ARIA SESSION SUMMARY
Course Elapsed Days: 32
Plan Fractions Treated to Date: 25
Plan Prescribed Dose Per Fraction: 1.8 Gy
Plan Total Fractions Prescribed: 28
Plan Total Prescribed Dose: 50.4 Gy
Reference Point Dosage Given to Date: 45 Gy
Reference Point Session Dosage Given: 1.8 Gy
Session Number: 25

## 2021-10-12 ENCOUNTER — Other Ambulatory Visit: Payer: Self-pay

## 2021-10-13 ENCOUNTER — Other Ambulatory Visit: Payer: Self-pay

## 2021-10-13 ENCOUNTER — Ambulatory Visit
Admission: RE | Admit: 2021-10-13 | Discharge: 2021-10-13 | Disposition: A | Discharge status: Home / Self Care | Payer: 59 | Source: Ambulatory Visit | Attending: Radiation Oncology | Admitting: Radiation Oncology

## 2021-10-13 DIAGNOSIS — I890 Lymphedema, not elsewhere classified: Secondary | ICD-10-CM | POA: Diagnosis not present

## 2021-10-13 DIAGNOSIS — C50411 Malignant neoplasm of upper-outer quadrant of right female breast: Secondary | ICD-10-CM | POA: Diagnosis not present

## 2021-10-13 DIAGNOSIS — Z510 Encounter for antineoplastic radiation therapy: Secondary | ICD-10-CM | POA: Diagnosis not present

## 2021-10-13 DIAGNOSIS — Z170 Estrogen receptor positive status [ER+]: Secondary | ICD-10-CM | POA: Diagnosis not present

## 2021-10-13 LAB — RAD ONC ARIA SESSION SUMMARY
Course Elapsed Days: 35
Plan Fractions Treated to Date: 26
Plan Prescribed Dose Per Fraction: 1.8 Gy
Plan Total Fractions Prescribed: 28
Plan Total Prescribed Dose: 50.4 Gy
Reference Point Dosage Given to Date: 46.8 Gy
Reference Point Session Dosage Given: 1.8 Gy
Session Number: 26

## 2021-10-14 ENCOUNTER — Ambulatory Visit
Admission: RE | Admit: 2021-10-14 | Discharge: 2021-10-14 | Disposition: A | Discharge status: Home / Self Care | Payer: 59 | Source: Ambulatory Visit | Attending: Radiation Oncology | Admitting: Radiation Oncology

## 2021-10-14 ENCOUNTER — Other Ambulatory Visit: Payer: Self-pay

## 2021-10-14 DIAGNOSIS — C50411 Malignant neoplasm of upper-outer quadrant of right female breast: Secondary | ICD-10-CM | POA: Diagnosis not present

## 2021-10-14 DIAGNOSIS — Z170 Estrogen receptor positive status [ER+]: Secondary | ICD-10-CM | POA: Diagnosis not present

## 2021-10-14 DIAGNOSIS — Z510 Encounter for antineoplastic radiation therapy: Secondary | ICD-10-CM | POA: Diagnosis not present

## 2021-10-14 DIAGNOSIS — I890 Lymphedema, not elsewhere classified: Secondary | ICD-10-CM | POA: Diagnosis not present

## 2021-10-14 LAB — RAD ONC ARIA SESSION SUMMARY
Course Elapsed Days: 36
Plan Fractions Treated to Date: 27
Plan Prescribed Dose Per Fraction: 1.8 Gy
Plan Total Fractions Prescribed: 28
Plan Total Prescribed Dose: 50.4 Gy
Reference Point Dosage Given to Date: 48.6 Gy
Reference Point Session Dosage Given: 1.8 Gy
Session Number: 27

## 2021-10-15 ENCOUNTER — Inpatient Hospital Stay (HOSPITAL_BASED_OUTPATIENT_CLINIC_OR_DEPARTMENT_OTHER): Payer: 59 | Admitting: Oncology

## 2021-10-15 ENCOUNTER — Inpatient Hospital Stay: Payer: 59

## 2021-10-15 ENCOUNTER — Encounter: Payer: Self-pay | Admitting: Oncology

## 2021-10-15 ENCOUNTER — Other Ambulatory Visit: Payer: Self-pay | Admitting: *Deleted

## 2021-10-15 ENCOUNTER — Other Ambulatory Visit: Payer: Self-pay

## 2021-10-15 ENCOUNTER — Other Ambulatory Visit: Payer: Self-pay | Admitting: Pharmacist

## 2021-10-15 ENCOUNTER — Ambulatory Visit
Admission: RE | Admit: 2021-10-15 | Discharge: 2021-10-15 | Disposition: A | Discharge status: Home / Self Care | Payer: 59 | Source: Ambulatory Visit | Attending: Radiation Oncology | Admitting: Radiation Oncology

## 2021-10-15 ENCOUNTER — Other Ambulatory Visit (HOSPITAL_COMMUNITY): Payer: Self-pay

## 2021-10-15 VITALS — BP 116/74 | HR 79 | Temp 98.1°F | Resp 16 | Ht 59.0 in | Wt 138.5 lb

## 2021-10-15 DIAGNOSIS — Z5112 Encounter for antineoplastic immunotherapy: Secondary | ICD-10-CM

## 2021-10-15 DIAGNOSIS — Z170 Estrogen receptor positive status [ER+]: Secondary | ICD-10-CM

## 2021-10-15 DIAGNOSIS — C50411 Malignant neoplasm of upper-outer quadrant of right female breast: Secondary | ICD-10-CM

## 2021-10-15 DIAGNOSIS — I890 Lymphedema, not elsewhere classified: Secondary | ICD-10-CM | POA: Diagnosis not present

## 2021-10-15 DIAGNOSIS — Z510 Encounter for antineoplastic radiation therapy: Secondary | ICD-10-CM | POA: Diagnosis not present

## 2021-10-15 LAB — CBC WITH DIFFERENTIAL/PLATELET
Abs Immature Granulocytes: 0.01 10*3/uL (ref 0.00–0.07)
Basophils Absolute: 0 10*3/uL (ref 0.0–0.1)
Basophils Relative: 0 %
Eosinophils Absolute: 0.1 10*3/uL (ref 0.0–0.5)
Eosinophils Relative: 3 %
HCT: 38 % (ref 36.0–46.0)
Hemoglobin: 13.1 g/dL (ref 12.0–15.0)
Immature Granulocytes: 0 %
Lymphocytes Relative: 15 %
Lymphs Abs: 0.7 10*3/uL (ref 0.7–4.0)
MCH: 31.2 pg (ref 26.0–34.0)
MCHC: 34.5 g/dL (ref 30.0–36.0)
MCV: 90.5 fL (ref 80.0–100.0)
Monocytes Absolute: 0.3 10*3/uL (ref 0.1–1.0)
Monocytes Relative: 7 %
Neutro Abs: 3.5 10*3/uL (ref 1.7–7.7)
Neutrophils Relative %: 75 %
Platelets: 194 10*3/uL (ref 150–400)
RBC: 4.2 MIL/uL (ref 3.87–5.11)
RDW: 13.8 % (ref 11.5–15.5)
WBC: 4.6 10*3/uL (ref 4.0–10.5)
nRBC: 0 % (ref 0.0–0.2)

## 2021-10-15 LAB — COMPREHENSIVE METABOLIC PANEL
ALT: 16 U/L (ref 0–44)
AST: 23 U/L (ref 15–41)
Albumin: 4.2 g/dL (ref 3.5–5.0)
Alkaline Phosphatase: 84 U/L (ref 38–126)
Anion gap: 10 (ref 5–15)
BUN: 15 mg/dL (ref 8–23)
CO2: 27 mmol/L (ref 22–32)
Calcium: 9.5 mg/dL (ref 8.9–10.3)
Chloride: 101 mmol/L (ref 98–111)
Creatinine, Ser: 0.69 mg/dL (ref 0.44–1.00)
GFR, Estimated: >60 mL/min (ref >60)
Glucose, Bld: 120 mg/dL — ABNORMAL HIGH (ref 70–99)
Potassium: 4 mmol/L (ref 3.5–5.1)
Sodium: 138 mmol/L (ref 135–145)
Total Bilirubin: 0.6 mg/dL (ref 0.3–1.2)
Total Protein: 7.2 g/dL (ref 6.5–8.1)

## 2021-10-15 LAB — RAD ONC ARIA SESSION SUMMARY
Course Elapsed Days: 37
Plan Fractions Treated to Date: 28
Plan Prescribed Dose Per Fraction: 1.8 Gy
Plan Total Fractions Prescribed: 28
Plan Total Prescribed Dose: 50.4 Gy
Reference Point Dosage Given to Date: 50.4 Gy
Reference Point Session Dosage Given: 1.8 Gy
Session Number: 28

## 2021-10-15 MED ORDER — SODIUM CHLORIDE 0.9% FLUSH
10.0000 mL | INTRAVENOUS | Status: DC | PRN
  Filled 2021-10-15: qty 10

## 2021-10-15 MED ORDER — HEPARIN SOD (PORK) LOCK FLUSH 100 UNIT/ML IV SOLN
500.0000 [IU] | Freq: Once | INTRAVENOUS | Status: AC | PRN
  Administered 2021-10-15: 500 [IU]
  Filled 2021-10-15: qty 5

## 2021-10-15 MED ORDER — SODIUM CHLORIDE 0.9 % IV SOLN
Freq: Once | INTRAVENOUS | Status: AC
  Filled 2021-10-15: qty 250

## 2021-10-15 MED ORDER — CAPECITABINE 500 MG PO TABS
ORAL_TABLET | ORAL | 1 refills | Status: DC
  Filled 2021-10-15: qty 98, fill #0
  Filled 2021-10-21: qty 98, 21d supply, fill #0
  Filled 2021-11-24: qty 98, 21d supply, fill #1

## 2021-10-15 MED ORDER — SODIUM CHLORIDE 0.9 % IV SOLN
200.0000 mg | Freq: Once | INTRAVENOUS | Status: AC
  Administered 2021-10-15: 200 mg via INTRAVENOUS
  Filled 2021-10-15: qty 8

## 2021-10-15 MED ORDER — STRATA XRT EX GEL: 1.0000 | Freq: Three times a day (TID) | CUTANEOUS | 1 refills | Status: DC | PRN

## 2021-10-15 NOTE — Progress Notes (Signed)
ON PATHWAY REGIMEN - Breast  No Change  Continue With Treatment as Ordered.  Original Decision Date/Time: 05/30/2021 12:56     A cycle is every 21 days:     Capecitabine   **Always confirm dose/schedule in your pharmacy ordering system**  Patient Characteristics: Post-Neoadjuvant Therapy and Resection, HER2 Negative/Unknown/Equivocal, Residual Disease, ER Positive, Adjuvant Chemotherapy/Targeted Therapy, BRCA-WT/Unknown Therapeutic Status: Post-Neoadjuvant Therapy and Resection Residual Invasive Disease Post-Neoadjuvant Therapy<= Yes ER Status: Positive (+) HER2 Status: Negative (-) PR Status: Negative (-) BRCA Mutation Status: BRCA Wild-Type Intent of Therapy: Curative Intent, Discussed with Patient

## 2021-10-15 NOTE — Patient Instructions (Signed)
[facility]  Discharge Instructions: Thank you for choosing [facility] to provide your oncology and hematology care.  If you have a lab appointment with the Cancer Center, please go directly to the Cancer Center and check in at the registration area.  Wear comfortable clothing and clothing appropriate for easy access to any Portacath or PICC line.   We strive to give you quality time with your provider. You may need to reschedule your appointment if you arrive late (15 or more minutes).  Arriving late affects you and other patients whose appointments are after yours.  Also, if you miss three or more appointments without notifying the office, you may be dismissed from the clinic at the provider's discretion.      For prescription refill requests, have your pharmacy contact our office and allow 72 hours for refills to be completed.    Today you received the following chemotherapy and/or immunotherapy agents Keytruda      To help prevent nausea and vomiting after your treatment, we encourage you to take your nausea medication as directed.  BELOW ARE SYMPTOMS THAT SHOULD BE REPORTED IMMEDIATELY: *FEVER GREATER THAN 100.4 F (38 C) OR HIGHER *CHILLS OR SWEATING *NAUSEA AND VOMITING THAT IS NOT CONTROLLED WITH YOUR NAUSEA MEDICATION *UNUSUAL SHORTNESS OF BREATH *UNUSUAL BRUISING OR BLEEDING *URINARY PROBLEMS (pain or burning when urinating, or frequent urination) *BOWEL PROBLEMS (unusual diarrhea, constipation, pain near the anus) TENDERNESS IN MOUTH AND THROAT WITH OR WITHOUT PRESENCE OF ULCERS (sore throat, sores in mouth, or a toothache) UNUSUAL RASH, SWELLING OR PAIN  UNUSUAL VAGINAL DISCHARGE OR ITCHING   Items with * indicate a potential emergency and should be followed up as soon as possible or go to the Emergency Department if any problems should occur.  Please show the CHEMOTHERAPY ALERT CARD or IMMUNOTHERAPY ALERT CARD at check-in to  the Emergency Department and triage nurse.  Should you have questions after your visit or need to cancel or reschedule your appointment, please contact [facility]  [phone] and follow the prompts.  Office hours are 8:00 a.m. to 4:30 p.m. Monday - Friday. Please note that voicemails left after 4:00 p.m. may not be returned until the following business day.  We are closed weekends and major holidays. You have access to a nurse at all times for urgent questions. Please call the main number to the clinic [phone] and follow the prompts.  For any non-urgent questions, you may also contact your provider using MyChart. We now offer e-Visits for anyone 18 and older to request care online for non-urgent symptoms. For details visit [email or web address].   Also download the MyChart app! Go to the app store, search "MyChart", open the app, select [facility], and log in with your MyChart username and password.  Masks are optional in the cancer centers. If you would like for your care team to wear a mask while they are taking care of you, please let them know. For doctor visits, patients may have with them one support person who is at least 62 years old. At this time, visitors are not allowed in the infusion area.   

## 2021-10-16 ENCOUNTER — Ambulatory Visit
Admission: RE | Admit: 2021-10-16 | Discharge: 2021-10-16 | Disposition: A | Discharge status: Home / Self Care | Payer: 59 | Source: Ambulatory Visit | Attending: Radiation Oncology | Admitting: Radiation Oncology

## 2021-10-16 ENCOUNTER — Encounter: Payer: Self-pay | Admitting: Oncology

## 2021-10-16 ENCOUNTER — Other Ambulatory Visit: Payer: Self-pay

## 2021-10-16 DIAGNOSIS — Z510 Encounter for antineoplastic radiation therapy: Secondary | ICD-10-CM | POA: Diagnosis not present

## 2021-10-16 DIAGNOSIS — I890 Lymphedema, not elsewhere classified: Secondary | ICD-10-CM | POA: Diagnosis not present

## 2021-10-16 DIAGNOSIS — C50411 Malignant neoplasm of upper-outer quadrant of right female breast: Secondary | ICD-10-CM | POA: Diagnosis not present

## 2021-10-16 LAB — RAD ONC ARIA SESSION SUMMARY
Course Elapsed Days: 38
Plan Fractions Treated to Date: 1
Plan Prescribed Dose Per Fraction: 2 Gy
Plan Total Fractions Prescribed: 5
Plan Total Prescribed Dose: 10 Gy
Reference Point Dosage Given to Date: 52.4 Gy
Reference Point Session Dosage Given: 2 Gy
Session Number: 29
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
## 2021-10-17 ENCOUNTER — Other Ambulatory Visit: Payer: Self-pay

## 2021-10-17 ENCOUNTER — Ambulatory Visit
Admission: RE | Admit: 2021-10-17 | Discharge: 2021-10-17 | Disposition: A | Discharge status: Home / Self Care | Payer: 59 | Source: Ambulatory Visit | Attending: Radiation Oncology | Admitting: Radiation Oncology

## 2021-10-17 DIAGNOSIS — Z510 Encounter for antineoplastic radiation therapy: Secondary | ICD-10-CM | POA: Diagnosis not present

## 2021-10-17 DIAGNOSIS — Z170 Estrogen receptor positive status [ER+]: Secondary | ICD-10-CM | POA: Diagnosis not present

## 2021-10-17 DIAGNOSIS — C50411 Malignant neoplasm of upper-outer quadrant of right female breast: Secondary | ICD-10-CM | POA: Diagnosis not present

## 2021-10-17 DIAGNOSIS — I890 Lymphedema, not elsewhere classified: Secondary | ICD-10-CM | POA: Diagnosis not present

## 2021-10-17 LAB — RAD ONC ARIA SESSION SUMMARY
Course Elapsed Days: 39
Plan Fractions Treated to Date: 2
Plan Prescribed Dose Per Fraction: 2 Gy
Plan Total Fractions Prescribed: 5
Plan Total Prescribed Dose: 10 Gy
Reference Point Dosage Given to Date: 54.4 Gy
Reference Point Session Dosage Given: 2 Gy
Session Number: 30

## 2021-10-20 ENCOUNTER — Ambulatory Visit
Admission: RE | Admit: 2021-10-20 | Discharge: 2021-10-20 | Disposition: A | Discharge status: Home / Self Care | Payer: 59 | Source: Ambulatory Visit | Attending: Radiation Oncology | Admitting: Radiation Oncology

## 2021-10-20 ENCOUNTER — Other Ambulatory Visit: Payer: Self-pay

## 2021-10-20 DIAGNOSIS — C50411 Malignant neoplasm of upper-outer quadrant of right female breast: Secondary | ICD-10-CM | POA: Diagnosis not present

## 2021-10-20 DIAGNOSIS — I890 Lymphedema, not elsewhere classified: Secondary | ICD-10-CM | POA: Diagnosis not present

## 2021-10-20 DIAGNOSIS — Z510 Encounter for antineoplastic radiation therapy: Secondary | ICD-10-CM | POA: Diagnosis not present

## 2021-10-20 LAB — RAD ONC ARIA SESSION SUMMARY
Course Elapsed Days: 42
Plan Fractions Treated to Date: 3
Plan Prescribed Dose Per Fraction: 2 Gy
Plan Total Fractions Prescribed: 5
Plan Total Prescribed Dose: 10 Gy
Reference Point Dosage Given to Date: 56.4 Gy
Reference Point Session Dosage Given: 2 Gy
Session Number: 31

## 2021-10-21 ENCOUNTER — Other Ambulatory Visit (HOSPITAL_COMMUNITY): Payer: Self-pay

## 2021-10-21 ENCOUNTER — Inpatient Hospital Stay: Payer: 59 | Admitting: Pharmacist

## 2021-10-21 ENCOUNTER — Ambulatory Visit
Admission: RE | Admit: 2021-10-21 | Discharge: 2021-10-21 | Disposition: A | Discharge status: Home / Self Care | Payer: 59 | Source: Ambulatory Visit | Attending: Radiation Oncology | Admitting: Radiation Oncology

## 2021-10-21 ENCOUNTER — Other Ambulatory Visit: Payer: Self-pay

## 2021-10-21 DIAGNOSIS — Z510 Encounter for antineoplastic radiation therapy: Secondary | ICD-10-CM | POA: Diagnosis not present

## 2021-10-21 DIAGNOSIS — C50411 Malignant neoplasm of upper-outer quadrant of right female breast: Secondary | ICD-10-CM

## 2021-10-21 DIAGNOSIS — I890 Lymphedema, not elsewhere classified: Secondary | ICD-10-CM | POA: Diagnosis not present

## 2021-10-21 LAB — RAD ONC ARIA SESSION SUMMARY
Course Elapsed Days: 43
Plan Fractions Treated to Date: 4
Plan Prescribed Dose Per Fraction: 2 Gy
Plan Total Fractions Prescribed: 5
Plan Total Prescribed Dose: 10 Gy
Reference Point Dosage Given to Date: 58.4 Gy
Reference Point Session Dosage Given: 2 Gy
Session Number: 32

## 2021-10-21 NOTE — Progress Notes (Signed)
[place]  Telephone:([phone] [phone]  Patient Care Team: Leone Haven, MD as PCP - General (Family Medicine) Theodore Demark, RN (Inactive) as Oncology Nurse Navigator Sindy Guadeloupe, MD as Consulting Physician (Oncology)   Name of the patient: Amanda Skinner  [id number]  1960-05-16   Date of visit: 10/21/21  HPI: Patient is a 61 y.o. female with stage IIb breast cancer, ER weakly positive PR negative, HER2 negative. She is currently receiving adjuvant pembrolizumab and will finish up her radiation therapy tomorrow. (Xeloda) capecitabine will be added to her regimen on 11/05/21, with the plan of completing 8 cycles.  Reason for Consult: Capecitabine oral chemotherapy education.   PAST MEDICAL HISTORY: Past Medical History:  Diagnosis Date   Asthma 2001   Atypical mole 07/30/2006   spinal upper back/moderate   Breast cancer ([place]) 09/2020   triple neg   Chronic sinusitis    Complication of anesthesia    Depression    Diffuse cystic mastopathy 2012   left   Dysplastic nevus 07/30/2006   Spinal upper back. Moderate atypia, halo nevus features, edge involved.    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    PONV (postoperative nausea and vomiting)    Seasonal allergies    TMJ (dislocation of temporomandibular joint) 2012   Ulcer     HEMATOLOGY/ONCOLOGY HISTORY:  Oncology History  Malignant neoplasm of upper-outer quadrant of right breast in female, estrogen receptor positive ([place])  10/30/2020 Cancer Staging   Staging form: Breast, AJCC 8th Edition - Clinical stage from 10/30/2020: Stage IIB (cT1c, cN1, cM0, G3, ER+, PR-, HER2-) - Signed by Sindy Guadeloupe, MD on 11/03/2020 Histologic grading system: 3 grade system   11/03/2020 Initial Diagnosis   Malignant neoplasm of upper-outer quadrant of right breast in female, estrogen receptor positive ([place])   11/08/2020 - 04/21/2021 Chemotherapy   Patient is  on Treatment Plan : BREAST Pembrolizumab 272m q3w D1 + Carboplatin D1,22q3w + Paclitaxel D1,8,15,22,29,36  X 2 cycles / Pembrolizumab (200 mg) D1, D22+ AC D1,22 q42d x 2 cycles     08/13/2021 - 09/24/2021 Chemotherapy   Patient is on Treatment Plan : BREAST Pembrolizumab (200) q21d x 27 weeks and Capecitabine     10/15/2021 -  Chemotherapy   Patient is on Treatment Plan : BREAST adjuvant [id number] Pembrolizumab (200) q21d x 9 cycles       ALLERGIES:  is allergic to etodolac.  MEDICATIONS:  Current Outpatient Medications  Medication Sig Dispense Refill   acetaminophen (TYLENOL) 500 MG tablet Take 1,000 mg by mouth.     albuterol (VENTOLIN HFA) 108 (90 Base) MCG/ACT inhaler INHALE 2 PUFFS INTO THE LUNGS EVERY 6 HOURS AS NEEDED FOR WHEEZING OR SHORTNESS OF BREATH. 18 g 0   Calcium Carb-Cholecalciferol (CALCIUM-VITAMIN D) 500-200 MG-UNIT tablet Take 2 tablets by mouth daily.      capecitabine (XELODA) 500 MG tablet Take 3 tablets (15022m by mouth in AM and 4 tablets (200031min PM. Take with food. Take for 14 days, then hold for 7 days. Repeat every 21 days. 98 tablet 1   Dermatological Products, Misc. (STRATA XRT) GEL Apply 1 Application topically 3 (three) times daily as needed. 50 g 1   estradiol (ESTRACE) 0.1 MG/GM vaginal cream Place 2 g vaginally twice a week 42.5 g 5   fluticasone (FLONASE) 50 MCG/ACT nasal spray Place 2 sprays into both nostrils daily. 16 g 6   gabapentin (NEURONTIN)  100 MG capsule Take 1 capsule by mouth three times daily 270 capsule 3   ibuprofen (ADVIL) 200 MG tablet Take 400 mg by mouth every 6 (six) hours as needed.     lidocaine-prilocaine (EMLA) cream Apply 1 Application topically as needed. Small amount of cream over port  1hour before use of port for chemo 30 g 3   loratadine (CLARITIN) 5 MG chewable tablet Chew 5 mg by mouth daily.     magic mouthwash (multi-ingredient) oral suspension Swish and swallow with 1 to 2 teaspoonsful 4 times a day (Patient not  taking: Reported on 05/30/2021) 480 mL 3   ondansetron (ZOFRAN-ODT) 4 MG disintegrating tablet Take 4 mg by mouth every 8 (eight) hours as needed.     prednisoLONE (ORAPRED) 15 MG/5ML solution Take by mouth. (Patient not taking: Reported on 03/28/2021)     pseudoephedrine (SUDAFED) 120 MG 12 hr tablet Take 120 mg by mouth daily as needed.     traMADol (ULTRAM) 50 MG tablet Take 50 mg by mouth every 6 (six) hours as needed. (Patient not taking: Reported on 09/24/2021)     No current facility-administered medications for this visit.   Facility-Administered Medications Ordered in Other Visits  Medication Dose Route Frequency Provider Last Rate Last Admin   heparin lock flush 100 unit/mL  500 Units Intravenous Once Sindy Guadeloupe, MD       sodium chloride flush (NS) 0.9 % injection 10 mL  10 mL Intravenous PRN Sindy Guadeloupe, MD   10 mL at 01/24/21 0842    VITAL SIGNS: LMP 06/15/2011 (Approximate)  There were no vitals filed for this visit.  Estimated body mass index is 27.97 kg/m as calculated from the following:   Height as of 10/15/21: 4' 11"  (1.499 m).   Weight as of 10/15/21: 62.8 kg (138 lb 8 oz).  LABS: CBC:    Component Value Date/Time   WBC 4.6 10/15/2021 1316   HGB 13.1 10/15/2021 1316   HGB 14.5 08/29/2012 1429   HCT 38.0 10/15/2021 1316   HCT 40.9 08/29/2012 1429   PLT 194 10/15/2021 1316   PLT 241 08/29/2012 1429   MCV 90.5 10/15/2021 1316   MCV 90 08/29/2012 1429   NEUTROABS 3.5 10/15/2021 1316   NEUTROABS 4.1 08/29/2012 1429   LYMPHSABS 0.7 10/15/2021 1316   LYMPHSABS 1.6 08/29/2012 1429   MONOABS 0.3 10/15/2021 1316   MONOABS 0.4 08/29/2012 1429   EOSABS 0.1 10/15/2021 1316   EOSABS 0.1 08/29/2012 1429   BASOSABS 0.0 10/15/2021 1316   BASOSABS 0.0 08/29/2012 1429   Comprehensive Metabolic Panel:    Component Value Date/Time   NA 138 10/15/2021 1316   NA 140 04/10/2016 0000   K 4.0 10/15/2021 1316   CL 101 10/15/2021 1316   CO2 27 10/15/2021 1316   BUN 15  10/15/2021 1316   BUN 13 04/10/2016 0000   CREATININE 0.69 10/15/2021 1316   GLUCOSE 120 (H) 10/15/2021 1316   CALCIUM 9.5 10/15/2021 1316   AST 23 10/15/2021 1316   ALT 16 10/15/2021 1316   ALKPHOS 84 10/15/2021 1316   BILITOT 0.6 10/15/2021 1316   PROT 7.2 10/15/2021 1316   ALBUMIN 4.2 10/15/2021 1316     Present during today's visit: patient only, seen down in radiation  Start plan: patient to start capecitabine on 11/05/21   Patient Education I spoke with patient for overview of new oral chemotherapy medication: capecitabine   Administration: Counseled patient on administration, dosing, side effects, monitoring, drug-food  interactions, safe handling, storage, and disposal. Patient will take 3 tablets (1519m) by mouth in AM and 4 tablets (20063m in PM. Take with food. Take for 14 days, then hold for 7 days. Repeat every 21 days.  Side Effects: Side effects include but not limited to: diarrhea, hand-foot syndrome, mouth sores, edema, decreased wbc, fatigue, N/V Diarrhea: patient plans on picking up loperamide to have on hand if needed. She knows to call the office if she is having 4 or more loose stools Hand-foot syndrome: Patient reports already having urea cream to use. She knows to report any skin changes she notices on her hands or feet Mouth sores: Patient knows to request magic mouthwash if needed    Drug-drug Interactions (DDI): No current DDIs with capecitabine  Adherence: After discussion with patient no patient barriers to medication adherence identified.  Reviewed with patient importance of keeping a medication schedule and plan for any missed doses.  Ms. HaHusbyoiced understanding and appreciation. All questions answered. Medication handout provided.  Provided patient with [facility]. Patient knows to call the office with questions or concerns. Oral [facility]otherapy [facility] will continue to follow.  Patient  expressed understanding and was in agreement with this plan. She also understands that She can call clinic at any time with any questions, concerns, or complaints.   Medication Access Issues: No issues, patient will [place] with [facility] [place]  Follow-up plan: RTC in 2 weeks  Thank you for allowing me to participate in the care of this patient.   Time Total: 25 mins  Visit consisted of counseling and education on dealing with issues of symptom management in the setting of serious and potentially life-threatening illness.Greater than 50%  of this time was spent counseling and coordinating care related to the above assessment and plan.  Signed by: AlDarl PikesPharmD, BCPS, BCSalley SlaughterCPP Hematology/Oncology Clinical Pharmacist Practitioner [place]/DB/AP [phone]/29/2023 3:45 PM

## 2021-10-22 ENCOUNTER — Other Ambulatory Visit: Payer: Self-pay

## 2021-10-22 ENCOUNTER — Other Ambulatory Visit (HOSPITAL_COMMUNITY): Payer: Self-pay

## 2021-10-22 ENCOUNTER — Ambulatory Visit
Admission: RE | Admit: 2021-10-22 | Discharge: 2021-10-22 | Disposition: A | Discharge status: Home / Self Care | Payer: 59 | Source: Ambulatory Visit | Attending: Radiation Oncology | Admitting: Radiation Oncology

## 2021-10-22 DIAGNOSIS — C50411 Malignant neoplasm of upper-outer quadrant of right female breast: Secondary | ICD-10-CM | POA: Diagnosis not present

## 2021-10-22 DIAGNOSIS — I890 Lymphedema, not elsewhere classified: Secondary | ICD-10-CM | POA: Diagnosis not present

## 2021-10-22 DIAGNOSIS — Z170 Estrogen receptor positive status [ER+]: Secondary | ICD-10-CM | POA: Diagnosis not present

## 2021-10-22 DIAGNOSIS — Z510 Encounter for antineoplastic radiation therapy: Secondary | ICD-10-CM | POA: Diagnosis not present

## 2021-10-22 LAB — RAD ONC ARIA SESSION SUMMARY
Course Elapsed Days: 44
Plan Fractions Treated to Date: 5
Plan Prescribed Dose Per Fraction: 2 Gy
Plan Total Fractions Prescribed: 5
Plan Total Prescribed Dose: 10 Gy
Reference Point Dosage Given to Date: 60.4 Gy
Reference Point Session Dosage Given: 2 Gy
Session Number: 33

## 2021-10-29 ENCOUNTER — Inpatient Hospital Stay: Payer: 59 | Attending: Oncology | Admitting: Occupational Therapy

## 2021-10-29 ENCOUNTER — Ambulatory Visit: Payer: 59 | Admitting: Occupational Therapy

## 2021-10-29 DIAGNOSIS — M25611 Stiffness of right shoulder, not elsewhere classified: Secondary | ICD-10-CM

## 2021-10-29 DIAGNOSIS — Z9189 Other specified personal risk factors, not elsewhere classified: Secondary | ICD-10-CM

## 2021-10-29 DIAGNOSIS — M24519 Contracture, unspecified shoulder: Secondary | ICD-10-CM | POA: Diagnosis not present
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
## 2021-10-30 ENCOUNTER — Other Ambulatory Visit: Payer: Self-pay

## 2021-10-30 ENCOUNTER — Other Ambulatory Visit: Payer: Self-pay | Admitting: Family Medicine

## 2021-10-30 ENCOUNTER — Encounter: Payer: Self-pay | Admitting: Oncology

## 2021-10-31 ENCOUNTER — Inpatient Hospital Stay (HOSPITAL_BASED_OUTPATIENT_CLINIC_OR_DEPARTMENT_OTHER): Payer: 59 | Admitting: Hospice and Palliative Medicine

## 2021-10-31 ENCOUNTER — Other Ambulatory Visit: Payer: Self-pay | Admitting: Oncology

## 2021-10-31 ENCOUNTER — Other Ambulatory Visit: Payer: Self-pay

## 2021-10-31 DIAGNOSIS — Z170 Estrogen receptor positive status [ER+]: Secondary | ICD-10-CM

## 2021-10-31 DIAGNOSIS — U071 COVID-19: Secondary | ICD-10-CM

## 2021-10-31 DIAGNOSIS — K219 Gastro-esophageal reflux disease without esophagitis: Secondary | ICD-10-CM

## 2021-10-31 MED ORDER — NIRMATRELVIR/RITONAVIR (PAXLOVID)TABLET: 3.0000 | ORAL_TABLET | Freq: Two times a day (BID) | ORAL | 0 refills | Status: AC

## 2021-10-31 MED ORDER — ONDANSETRON 4 MG PO TBDP
4.0000 mg | ORAL_TABLET | Freq: Three times a day (TID) | ORAL | 2 refills | Status: DC | PRN
  Filled 2021-10-31: qty 20, 7d supply, fill #0

## 2021-10-31 MED ORDER — ONDANSETRON HCL 4 MG PO TABS
4.0000 mg | ORAL_TABLET | Freq: Three times a day (TID) | ORAL | 0 refills | Status: DC | PRN
  Filled 2021-10-31 – 2022-01-04 (×2): qty 20, 7d supply, fill #0

## 2021-10-31 MED ORDER — BENZONATATE 100 MG PO CAPS: 100.0000 mg | ORAL_CAPSULE | Freq: Three times a day (TID) | ORAL | 0 refills | Status: DC | PRN

## 2021-10-31 NOTE — Telephone Encounter (Signed)
Prescription sent to [facility] employee pharmacy

## 2021-10-31 NOTE — Telephone Encounter (Signed)
Amanda Skinner- Can you call her and see if she qualifies for paxlovid? [name]- push appt out by 1 more week

## 2021-10-31 NOTE — Progress Notes (Signed)
Virtual Visit via Video Note  I connected with Amanda Skinner on 10/31/21 at 11:30 AM EDT by a video enabled telemedicine application and verified that I am speaking with the correct person using two identifiers.  Location: Patient: Amanda Skinner Provider: Clinic   I discussed the limitations of evaluation and management by telemedicine and the availability of in person appointments. The patient expressed understanding and agreed to proceed.  History of Present Illness: Amanda Skinner is a 61 year old woman with multiple medical problems including stage IIb invasive mammary carcinoma of the right breast status post neoadjuvant chemotherapy and right lumpectomy.  Patient is now on adjuvant capecitabine, Keytruda, and XRT.   Observations/Objective: Virtual visit today requested due to patient testing COVID-positive yesterday.  She had a family exposure.  Patient reports that she became symptomatic on Wednesday (48 hours ago) with cough, congestion, body aches, fever with Tmax of 100.1.  She reports resolution of fever with acetaminophen but has a mild sore throat today.  No shortness of breath or chest pain.  Patient has been using inhalers as needed in addition to her incentive spirometer.  Patient is vaccinated.  She is an Therapist, sports.  Assessment and Plan: COVID -patient is high risk for complications on active chemotherapy.  She is within the treatment window and would benefit from initiation of Paxlovid.  Discussed with Alyson, PharmD and patient advised to hold fluticasone and tramadol due to possible drug to drug interactions with Paxlovid.  Discussed symptomatic and supportive care.  We also discussed ED precautions.  I recommended monitoring pulse oximetry.  She has a cough so we will start her on benzonatate as needed.  Follow Up Instructions: She has had appointments rescheduled to 9/20   I discussed the assessment and treatment plan with the patient. The patient was provided an opportunity to  ask questions and all were answered. The patient agreed with the plan and demonstrated an understanding of the instructions.   The patient was advised to call back or seek an in-person evaluation if the symptoms worsen or if the condition fails to improve as anticipated.  I provided 15 minutes of non-face-to-face time during this encounter.   Amanda Hong, NP

## 2021-11-03 ENCOUNTER — Other Ambulatory Visit: Payer: Self-pay

## 2021-11-04 ENCOUNTER — Other Ambulatory Visit (HOSPITAL_COMMUNITY): Payer: Self-pay

## 2021-11-05 ENCOUNTER — Ambulatory Visit: Payer: 59

## 2021-11-05 ENCOUNTER — Ambulatory Visit: Payer: 59 | Admitting: Oncology

## 2021-11-05 ENCOUNTER — Other Ambulatory Visit: Payer: 59

## 2021-11-12 ENCOUNTER — Inpatient Hospital Stay: Payer: 59

## 2021-11-12 ENCOUNTER — Inpatient Hospital Stay (HOSPITAL_BASED_OUTPATIENT_CLINIC_OR_DEPARTMENT_OTHER): Payer: 59 | Admitting: Oncology

## 2021-11-12 ENCOUNTER — Encounter: Payer: Self-pay | Admitting: Oncology

## 2021-11-12 VITALS — BP 117/72 | HR 79 | Temp 97.1°F | Resp 16 | Wt 136.6 lb

## 2021-11-12 DIAGNOSIS — Z170 Estrogen receptor positive status [ER+]: Secondary | ICD-10-CM | POA: Diagnosis not present

## 2021-11-12 DIAGNOSIS — C50411 Malignant neoplasm of upper-outer quadrant of right female breast: Secondary | ICD-10-CM | POA: Diagnosis not present

## 2021-11-12 DIAGNOSIS — Z5112 Encounter for antineoplastic immunotherapy: Secondary | ICD-10-CM

## 2021-11-12 DIAGNOSIS — Z79899 Other long term (current) drug therapy: Secondary | ICD-10-CM | POA: Diagnosis not present

## 2021-11-12 DIAGNOSIS — M24519 Contracture, unspecified shoulder: Secondary | ICD-10-CM | POA: Diagnosis not present

## 2021-11-12 LAB — COMPREHENSIVE METABOLIC PANEL
ALT: 16 U/L (ref 0–44)
AST: 19 U/L (ref 15–41)
Albumin: 4.1 g/dL (ref 3.5–5.0)
Alkaline Phosphatase: 105 U/L (ref 38–126)
Anion gap: 6 (ref 5–15)
BUN: 17 mg/dL (ref 8–23)
CO2: 28 mmol/L (ref 22–32)
Calcium: 9.1 mg/dL (ref 8.9–10.3)
Chloride: 104 mmol/L (ref 98–111)
Creatinine, Ser: 0.66 mg/dL (ref 0.44–1.00)
GFR, Estimated: >60 mL/min (ref >60)
Glucose, Bld: 90 mg/dL (ref 70–99)
Potassium: 4 mmol/L (ref 3.5–5.1)
Sodium: 138 mmol/L (ref 135–145)
Total Bilirubin: 0.5 mg/dL (ref 0.3–1.2)
Total Protein: 7.3 g/dL (ref 6.5–8.1)

## 2021-11-12 LAB — CBC WITH DIFFERENTIAL/PLATELET
Abs Immature Granulocytes: 0.01 10*3/uL (ref 0.00–0.07)
Basophils Absolute: 0 10*3/uL (ref 0.0–0.1)
Basophils Relative: 0 %
Eosinophils Absolute: 0.1 10*3/uL (ref 0.0–0.5)
Eosinophils Relative: 2 %
HCT: 36.5 % (ref 36.0–46.0)
Hemoglobin: 12.9 g/dL (ref 12.0–15.0)
Immature Granulocytes: 0 %
Lymphocytes Relative: 14 %
Lymphs Abs: 0.7 10*3/uL (ref 0.7–4.0)
MCH: 31.8 pg (ref 26.0–34.0)
MCHC: 35.3 g/dL (ref 30.0–36.0)
MCV: 89.9 fL (ref 80.0–100.0)
Monocytes Absolute: 0.5 10*3/uL (ref 0.1–1.0)
Monocytes Relative: 9 %
Neutro Abs: 3.9 10*3/uL (ref 1.7–7.7)
Neutrophils Relative %: 75 %
Platelets: 259 10*3/uL (ref 150–400)
RBC: 4.06 MIL/uL (ref 3.87–5.11)
RDW: 12.7 % (ref 11.5–15.5)
WBC: 5.2 10*3/uL (ref 4.0–10.5)
nRBC: 0 % (ref 0.0–0.2)

## 2021-11-12 MED ORDER — HEPARIN SOD (PORK) LOCK FLUSH 100 UNIT/ML IV SOLN
500.0000 [IU] | Freq: Once | INTRAVENOUS | Status: AC | PRN
  Administered 2021-11-12: 500 [IU]
  Filled 2021-11-12: qty 5

## 2021-11-12 MED ORDER — SODIUM CHLORIDE 0.9 % IV SOLN
200.0000 mg | Freq: Once | INTRAVENOUS | Status: AC
  Administered 2021-11-12: 200 mg via INTRAVENOUS
  Filled 2021-11-12: qty 200

## 2021-11-12 MED ORDER — SODIUM CHLORIDE 0.9 % IV SOLN
Freq: Once | INTRAVENOUS | Status: AC
  Filled 2021-11-12: qty 250

## 2021-11-12 NOTE — Patient Instructions (Signed)
[facility]  Discharge Instructions: Thank you for choosing [facility] to provide your oncology and hematology care.  If you have a lab appointment with the Cancer Center, please go directly to the Cancer Center and check in at the registration area.  Wear comfortable clothing and clothing appropriate for easy access to any Portacath or PICC line.   We strive to give you quality time with your provider. You may need to reschedule your appointment if you arrive late (15 or more minutes).  Arriving late affects you and other patients whose appointments are after yours.  Also, if you miss three or more appointments without notifying the office, you may be dismissed from the clinic at the provider's discretion.      For prescription refill requests, have your pharmacy contact our office and allow 72 hours for refills to be completed.    Today you received the following chemotherapy and/or immunotherapy agents Keytruda      To help prevent nausea and vomiting after your treatment, we encourage you to take your nausea medication as directed.  BELOW ARE SYMPTOMS THAT SHOULD BE REPORTED IMMEDIATELY: *FEVER GREATER THAN 100.4 F (38 C) OR HIGHER *CHILLS OR SWEATING *NAUSEA AND VOMITING THAT IS NOT CONTROLLED WITH YOUR NAUSEA MEDICATION *UNUSUAL SHORTNESS OF BREATH *UNUSUAL BRUISING OR BLEEDING *URINARY PROBLEMS (pain or burning when urinating, or frequent urination) *BOWEL PROBLEMS (unusual diarrhea, constipation, pain near the anus) TENDERNESS IN MOUTH AND THROAT WITH OR WITHOUT PRESENCE OF ULCERS (sore throat, sores in mouth, or a toothache) UNUSUAL RASH, SWELLING OR PAIN  UNUSUAL VAGINAL DISCHARGE OR ITCHING   Items with * indicate a potential emergency and should be followed up as soon as possible or go to the Emergency Department if any problems should occur.  Please show the CHEMOTHERAPY ALERT CARD or IMMUNOTHERAPY ALERT CARD at check-in to  the Emergency Department and triage nurse.  Should you have questions after your visit or need to cancel or reschedule your appointment, please contact [facility]  [phone] and follow the prompts.  Office hours are 8:00 a.m. to 4:30 p.m. Monday - Friday. Please note that voicemails left after 4:00 p.m. may not be returned until the following business day.  We are closed weekends and major holidays. You have access to a nurse at all times for urgent questions. Please call the main number to the clinic [phone] and follow the prompts.  For any non-urgent questions, you may also contact your provider using MyChart. We now offer e-Visits for anyone 18 and older to request care online for non-urgent symptoms. For details visit [email or web address].   Also download the MyChart app! Go to the app store, search "MyChart", open the app, select Welcome, and log in with your MyChart username and password.  Masks are optional in the cancer centers. If you would like for your care team to wear a mask while they are taking care of you, please let them know. For doctor visits, patients may have with them one support person who is at least 62 years old. At this time, visitors are not allowed in the infusion area.   
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
## 2021-11-19 ENCOUNTER — Encounter: Payer: Self-pay | Admitting: Oncology

## 2021-11-19 ENCOUNTER — Other Ambulatory Visit: Payer: Self-pay | Admitting: Radiation Oncology

## 2021-11-24 ENCOUNTER — Telehealth: Payer: Self-pay | Admitting: Pharmacist

## 2021-11-24 ENCOUNTER — Other Ambulatory Visit (HOSPITAL_COMMUNITY): Payer: Self-pay

## 2021-11-24 NOTE — Telephone Encounter (Addendum)
Oral Chemotherapy Pharmacist Encounter   Returned call to patient and she reported extreme fatigue since starting the capecitabine. The last 2 Sundays patient reports feeling exhausted all day. At one point she need assistance after church getting from the car into her sister's house. A few times she has felt too weak of drowsy to drive.  Her last dose of capecitabine, Day 14, is 11/25/21. Patient was instructed to stop her capecitabine now, no evening dose today and no doses on 11/25/21 due  to her extreme fatigue.  She RTC on 12/03/21 for a visit with Dr. [name]. At that visit that will discuss her dosing moving forward. Patient knows to call if she feels like she needs to be seen before then.  Amanda Skinner did state that she wants to stay and aggressive as possible with her dose to prevent the risk of recurrence.   No reported nausea, diarrhea, or hand foot syndrome   Darl Pikes, PharmD, BCPS, BCOP, CPP Hematology/Oncology Clinical Pharmacist [place]/DB/AP [facility] [phone]  11/24/2021 3:55 PM

## 2021-11-26 ENCOUNTER — Encounter: Payer: Self-pay | Admitting: Radiation Oncology

## 2021-11-26 ENCOUNTER — Ambulatory Visit
Admission: RE | Admit: 2021-11-26 | Discharge: 2021-11-26 | Disposition: A | Discharge status: Home / Self Care | Payer: 59 | Source: Ambulatory Visit | Attending: Radiation Oncology | Admitting: Radiation Oncology

## 2021-11-26 VITALS — BP 109/71 | HR 74 | Temp 97.5°F | Resp 16 | Wt 136.0 lb

## 2021-11-26 DIAGNOSIS — C50411 Malignant neoplasm of upper-outer quadrant of right female breast: Secondary | ICD-10-CM | POA: Diagnosis not present

## 2021-11-26 DIAGNOSIS — Z923 Personal history of irradiation: Secondary | ICD-10-CM | POA: Insufficient documentation

## 2021-11-26 DIAGNOSIS — Z170 Estrogen receptor positive status [ER+]: Secondary | ICD-10-CM | POA: Diagnosis not present

## 2021-11-26 DIAGNOSIS — R5383 Other fatigue: Secondary | ICD-10-CM | POA: Diagnosis not present

## 2021-11-26 NOTE — Progress Notes (Signed)
Radiation Oncology Follow up Note  Name: Amanda Skinner   Date:   11/26/2021 MRN:  [id number] DOB: [birth year]    This 61 y.o. female presents to the clinic today for 1 month follow-up status post whole breast radiation for stage IIb (T1 aN1 M0) weakly ER positive PR negative and HER2/neu negative status post neoadjuvant chemotherapy followed by wide local excision and sentinel node biopsy for invasive mammary carcinoma the right breast.  REFERRING PROVIDER: Leone Haven, MD  HPI: Patient is a 61 year old female now out 1 month having pleated whole breast radiation to her right breast for stage IIb triple negative invasive mammary carcinoma.  Seen today in routine follow-up she is doing fairly well from a breast standpoint although she is quite fatigued she is started oral Xeloda.  She does have some persistent tenderness in her right axilla consistent with axillary sampling otherwise specifically denies breast tenderness cough or bone pain..  She is not on endocrine therapy based on the triple negative nature of her disease.  COMPLICATIONS OF TREATMENT: none  FOLLOW UP COMPLIANCE: keeps appointments   PHYSICAL EXAM:  BP 109/71 (BP Location: Left Arm, Patient Position: Sitting, Cuff Size: Normal)   Pulse 74   Temp (!) 97.5 F (36.4 C) (Tympanic)   Resp 16   Wt 136 lb (61.7 kg)   LMP 06/15/2011 (Approximate)   BMI 27.47 kg/m  Lungs are clear to A&P cardiac examination essentially unremarkable with regular rate and rhythm. No dominant mass or nodularity is noted in either breast in 2 positions examined. Incision is well-healed. No axillary or supraclavicular adenopathy is appreciated. Cosmetic result is excellent.  Well-developed well-nourished patient in NAD. HEENT reveals PERLA, EOMI, discs not visualized.  Oral cavity is clear. No oral mucosal lesions are identified. Neck is clear without evidence of cervical or supraclavicular adenopathy. Lungs are clear to A&P. Cardiac  examination is essentially unremarkable with regular rate and rhythm without murmur rub or thrill. Abdomen is benign with no organomegaly or masses noted. Motor sensory and DTR levels are equal and symmetric in the upper and lower extremities. Cranial nerves II through XII are grossly intact. Proprioception is intact. No peripheral adenopathy or edema is identified. No motor or sensory levels are noted. Crude visual fields are within normal range.  RADIOLOGY RESULTS: No current films for review  PLAN: Present time she is doing well 1 month out from whole breast radiation.  And pleased with her overall progress.  I have asked to see her back in 5 to 6 months for follow-up.  Anticipate a mammogram by that time.  Patient otherwise knows to call with any concerns.  I would like to take this opportunity to thank you for allowing me to participate in the care of your patient.Noreene Filbert, MD

## 2021-11-27 ENCOUNTER — Ambulatory Visit
Admission: RE | Admit: 2021-11-27 | Discharge: 2021-11-27 | Disposition: A | Discharge status: Home / Self Care | Payer: 59 | Source: Ambulatory Visit | Attending: Oncology | Admitting: Oncology

## 2021-11-27 ENCOUNTER — Other Ambulatory Visit (HOSPITAL_COMMUNITY): Payer: Self-pay

## 2021-11-27 DIAGNOSIS — C50411 Malignant neoplasm of upper-outer quadrant of right female breast: Secondary | ICD-10-CM | POA: Insufficient documentation

## 2021-11-27 DIAGNOSIS — Z170 Estrogen receptor positive status [ER+]: Secondary | ICD-10-CM | POA: Insufficient documentation

## 2021-11-27 DIAGNOSIS — C50919 Malignant neoplasm of unspecified site of unspecified female breast: Secondary | ICD-10-CM | POA: Diagnosis not present

## 2021-11-27 DIAGNOSIS — R911 Solitary pulmonary nodule: Secondary | ICD-10-CM | POA: Diagnosis not present

## 2021-11-27 MED ORDER — IOHEXOL 300 MG/ML  SOLN
100.0000 mL | Freq: Once | INTRAMUSCULAR | Status: AC | PRN
  Administered 2021-11-27: 100 mL via INTRAVENOUS

## 2021-11-28 ENCOUNTER — Telehealth: Payer: Self-pay

## 2021-11-28 ENCOUNTER — Other Ambulatory Visit: Payer: Self-pay | Admitting: Oncology

## 2021-11-28 ENCOUNTER — Other Ambulatory Visit: Payer: Self-pay

## 2021-11-28 DIAGNOSIS — R918 Other nonspecific abnormal finding of lung field: Secondary | ICD-10-CM

## 2021-11-28 NOTE — Telephone Encounter (Signed)
Per Dr. Elroy Channel request CT images done on 11/27/2021 have been pushed to [facility] by [facility] in Radiology.

## 2021-12-01 ENCOUNTER — Other Ambulatory Visit: Payer: Self-pay | Admitting: Oncology

## 2021-12-01 NOTE — Progress Notes (Signed)
Spoke with pt. Re: pre-procedure instructions for lung biopsy 12/03/21. Pt. Instructed to remain NPO after 0400 (pt. States 'I have to have something to eat.'), have a driver with her to escort her home, to arrive at the medical mall, and to arrive at 10:30 AM for an 11:30 AM procedure. 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 Verbalized understanding of all instructions.

## 2021-12-02 ENCOUNTER — Encounter: Payer: Self-pay | Admitting: Oncology

## 2021-12-02 ENCOUNTER — Other Ambulatory Visit (HOSPITAL_COMMUNITY): Payer: Self-pay | Admitting: Student

## 2021-12-02 DIAGNOSIS — C50919 Malignant neoplasm of unspecified site of unspecified female breast: Secondary | ICD-10-CM

## 2021-12-03 ENCOUNTER — Ambulatory Visit
Admission: RE | Admit: 2021-12-03 | Discharge: 2021-12-03 | Disposition: A | Discharge status: Home / Self Care | Payer: 59 | Source: Ambulatory Visit | Attending: Oncology | Admitting: Oncology

## 2021-12-03 ENCOUNTER — Ambulatory Visit: Payer: 59 | Admitting: Oncology

## 2021-12-03 ENCOUNTER — Other Ambulatory Visit: Payer: 59

## 2021-12-03 ENCOUNTER — Ambulatory Visit: Payer: 59

## 2021-12-03 ENCOUNTER — Ambulatory Visit: Payer: 59 | Admitting: Occupational Therapy

## 2021-12-05 ENCOUNTER — Telehealth: Payer: Self-pay | Admitting: *Deleted

## 2021-12-05 ENCOUNTER — Other Ambulatory Visit: Payer: Self-pay | Admitting: *Deleted

## 2021-12-05 ENCOUNTER — Ambulatory Visit
Admission: RE | Admit: 2021-12-05 | Discharge: 2021-12-05 | Disposition: A | Discharge status: Home / Self Care | Payer: 59 | Source: Ambulatory Visit | Attending: Medical Oncology | Admitting: Medical Oncology

## 2021-12-05 ENCOUNTER — Inpatient Hospital Stay: Payer: 59 | Attending: Oncology | Admitting: Medical Oncology

## 2021-12-05 ENCOUNTER — Other Ambulatory Visit: Payer: Self-pay | Admitting: Oncology

## 2021-12-05 VITALS — BP 118/75 | HR 90 | Temp 96.6°F | Resp 18 | Ht 59.0 in | Wt 136.8 lb

## 2021-12-05 DIAGNOSIS — Z170 Estrogen receptor positive status [ER+]: Secondary | ICD-10-CM | POA: Diagnosis not present

## 2021-12-05 DIAGNOSIS — Z452 Encounter for adjustment and management of vascular access device: Secondary | ICD-10-CM | POA: Insufficient documentation

## 2021-12-05 DIAGNOSIS — C50411 Malignant neoplasm of upper-outer quadrant of right female breast: Secondary | ICD-10-CM | POA: Diagnosis not present

## 2021-12-05 DIAGNOSIS — Z79899 Other long term (current) drug therapy: Secondary | ICD-10-CM | POA: Diagnosis not present

## 2021-12-05 DIAGNOSIS — R599 Enlarged lymph nodes, unspecified: Secondary | ICD-10-CM | POA: Insufficient documentation

## 2021-12-05 DIAGNOSIS — M7989 Other specified soft tissue disorders: Secondary | ICD-10-CM | POA: Diagnosis not present

## 2021-12-05 DIAGNOSIS — C773 Secondary and unspecified malignant neoplasm of axilla and upper limb lymph nodes: Secondary | ICD-10-CM | POA: Insufficient documentation

## 2021-12-05 DIAGNOSIS — Z808 Family history of malignant neoplasm of other organs or systems: Secondary | ICD-10-CM | POA: Insufficient documentation

## 2021-12-05 DIAGNOSIS — R590 Localized enlarged lymph nodes: Secondary | ICD-10-CM

## 2021-12-05 DIAGNOSIS — R221 Localized swelling, mass and lump, neck: Secondary | ICD-10-CM

## 2021-12-05 NOTE — Telephone Encounter (Signed)
Patient called and states that her port is not right, it is puffy and "arched up" in the catheter in her neck. Not red or hot to touch, but it is tender, Her neck was bothering her which made her check She has an earache on that same side for a few days the port discomfort started yesterday. She is asking for it to be checked .Please advise

## 2021-12-05 NOTE — Telephone Encounter (Signed)
Spoke with patient. Made aware that Judson Roch, [place] will order a stat doppler today and see pt in [facility] for results. Pt understands that scheduling will be reaching out to arrange the doppler and smc apt.
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
## 2021-12-08 ENCOUNTER — Encounter: Payer: Self-pay | Admitting: Oncology

## 2021-12-08 ENCOUNTER — Encounter: Payer: Self-pay | Admitting: *Deleted

## 2021-12-08 DIAGNOSIS — C7802 Secondary malignant neoplasm of left lung: Secondary | ICD-10-CM | POA: Diagnosis not present

## 2021-12-08 DIAGNOSIS — C771 Secondary and unspecified malignant neoplasm of intrathoracic lymph nodes: Secondary | ICD-10-CM | POA: Diagnosis not present

## 2021-12-08 DIAGNOSIS — C7801 Secondary malignant neoplasm of right lung: Secondary | ICD-10-CM | POA: Diagnosis not present

## 2021-12-08 DIAGNOSIS — R590 Localized enlarged lymph nodes: Secondary | ICD-10-CM | POA: Diagnosis not present

## 2021-12-10 ENCOUNTER — Ambulatory Visit
Admission: RE | Admit: 2021-12-10 | Discharge: 2021-12-10 | Disposition: A | Discharge status: Home / Self Care | Payer: 59 | Source: Ambulatory Visit | Attending: Oncology | Admitting: Oncology

## 2021-12-10 ENCOUNTER — Other Ambulatory Visit: Payer: 59

## 2021-12-10 ENCOUNTER — Ambulatory Visit: Payer: 59 | Admitting: Oncology

## 2021-12-10 ENCOUNTER — Ambulatory Visit: Payer: 59

## 2021-12-10 VITALS — BP 109/75 | HR 77 | Resp 18

## 2021-12-10 DIAGNOSIS — C50411 Malignant neoplasm of upper-outer quadrant of right female breast: Secondary | ICD-10-CM | POA: Diagnosis not present

## 2021-12-10 DIAGNOSIS — Z170 Estrogen receptor positive status [ER+]: Secondary | ICD-10-CM | POA: Diagnosis not present

## 2021-12-10 DIAGNOSIS — C50919 Malignant neoplasm of unspecified site of unspecified female breast: Secondary | ICD-10-CM

## 2021-12-10 DIAGNOSIS — C779 Secondary and unspecified malignant neoplasm of lymph node, unspecified: Secondary | ICD-10-CM | POA: Diagnosis not present

## 2021-12-10 DIAGNOSIS — Z171 Estrogen receptor negative status [ER-]: Secondary | ICD-10-CM | POA: Diagnosis not present

## 2021-12-10 DIAGNOSIS — R590 Localized enlarged lymph nodes: Secondary | ICD-10-CM | POA: Diagnosis not present

## 2021-12-10 DIAGNOSIS — C50911 Malignant neoplasm of unspecified site of right female breast: Secondary | ICD-10-CM | POA: Diagnosis not present

## 2021-12-10 DIAGNOSIS — Z79899 Other long term (current) drug therapy: Secondary | ICD-10-CM | POA: Diagnosis not present

## 2021-12-10 MED ORDER — LIDOCAINE HCL (PF) 1 % IJ SOLN
10.0000 mL | Freq: Once | INTRAMUSCULAR | Status: AC
  Administered 2021-12-10: 10 mL via INTRADERMAL

## 2021-12-10 NOTE — Procedures (Signed)
Interventional Radiology Procedure Note  Procedure: US Guided Biopsy of left supraclavicular lymph node  Complications: None  Estimated Blood Loss: < 10 mL  Findings: 40 G core biopsy of 2.7 cm left supraclavicular LN performed under US guidance.  Six core samples obtained and sent to Pathology.  Venetia Night. Kathlene Cote, M.D Pager:  [phone]

## 2021-12-10 NOTE — Discharge Instructions (Addendum)
Needle Biopsy, Care After These instructions tell you how to care for yourself after your procedure. Your doctor may also give you more specific instructions. Call your doctor if you have any problems or questions. What can I expect after the procedure? After the procedure, it is common to have: Soreness. Bruising. Mild pain. Follow these instructions at home:  Return to your normal activities tomorrow. Take over-the-counter and prescription medicines only as told by your doctor. Wash your hands with soap and water before you change your bandage (dressing). If you cannot use soap and water, use hand sanitizer. You may remove your bandage in 2 days Check your puncture site every day for signs of infection. Watch for: Redness, swelling, or pain. Fluid or blood.  Pus or a bad smell. Warmth. You can shower tomorrow Keep all follow-up visits as told by your doctor. This is important. Contact a doctor if you have: A fever. Redness, swelling, or pain at the puncture site, and it lasts longer than a few days. Fluid, blood, or pus coming from the puncture site. Warmth coming from the puncture site. Get help right away if: You have a lot of bleeding from the puncture site. Summary After the procedure, it is common to have soreness, bruising, or mild pain at the puncture site. Check your puncture site every day for signs of infection, such as redness, swelling, or pain. Get help right away if you have severe bleeding from your puncture site. This information is not intended to replace advice given to you by your health care provider. Make sure you discuss any questions you have with your health care provider. Document Revised: 02/22/2017 Document Reviewed: 02/22/2017 Elsevier Patient Education  2020 [employer].

## 2021-12-11 ENCOUNTER — Other Ambulatory Visit (HOSPITAL_COMMUNITY): Payer: Self-pay

## 2021-12-11 ENCOUNTER — Other Ambulatory Visit: Payer: Self-pay | Admitting: Oncology

## 2021-12-11 DIAGNOSIS — J90 Pleural effusion, not elsewhere classified: Secondary | ICD-10-CM | POA: Diagnosis not present

## 2021-12-11 DIAGNOSIS — C7801 Secondary malignant neoplasm of right lung: Secondary | ICD-10-CM | POA: Diagnosis not present

## 2021-12-11 DIAGNOSIS — Z170 Estrogen receptor positive status [ER+]: Secondary | ICD-10-CM | POA: Diagnosis not present

## 2021-12-11 DIAGNOSIS — C50411 Malignant neoplasm of upper-outer quadrant of right female breast: Secondary | ICD-10-CM | POA: Diagnosis not present

## 2021-12-11 DIAGNOSIS — C781 Secondary malignant neoplasm of mediastinum: Secondary | ICD-10-CM | POA: Diagnosis not present

## 2021-12-11 DIAGNOSIS — C773 Secondary and unspecified malignant neoplasm of axilla and upper limb lymph nodes: Secondary | ICD-10-CM | POA: Diagnosis not present

## 2021-12-11 DIAGNOSIS — C7802 Secondary malignant neoplasm of left lung: Secondary | ICD-10-CM | POA: Diagnosis not present

## 2021-12-12 ENCOUNTER — Encounter: Payer: Self-pay | Admitting: Oncology

## 2021-12-12 ENCOUNTER — Other Ambulatory Visit (HOSPITAL_COMMUNITY): Payer: Self-pay

## 2021-12-12 MED ORDER — CAPECITABINE 500 MG PO TABS
ORAL_TABLET | ORAL | 1 refills | Status: DC
  Filled 2021-12-12: qty 98, 21d supply, fill #0

## 2021-12-15 ENCOUNTER — Telehealth: Payer: Self-pay | Admitting: Pharmacist

## 2021-12-15 ENCOUNTER — Other Ambulatory Visit: Payer: Self-pay | Admitting: Nurse Practitioner

## 2021-12-15 ENCOUNTER — Telehealth: Payer: Self-pay | Admitting: *Deleted

## 2021-12-15 ENCOUNTER — Other Ambulatory Visit: Payer: Self-pay

## 2021-12-15 ENCOUNTER — Encounter: Payer: Self-pay | Admitting: Oncology

## 2021-12-15 ENCOUNTER — Other Ambulatory Visit: Payer: Self-pay | Admitting: Oncology

## 2021-12-15 ENCOUNTER — Telehealth (HOSPITAL_COMMUNITY): Payer: Self-pay | Admitting: Nurse Practitioner

## 2021-12-15 ENCOUNTER — Other Ambulatory Visit (HOSPITAL_COMMUNITY): Payer: Self-pay

## 2021-12-15 DIAGNOSIS — C50411 Malignant neoplasm of upper-outer quadrant of right female breast: Secondary | ICD-10-CM

## 2021-12-15 NOTE — Telephone Encounter (Signed)
Called patient to review Dr. Elroy Channel recommendations. No answer. Left vm.

## 2021-12-15 NOTE — Telephone Encounter (Signed)
Oral Chemotherapy Pharmacist Encounter   Patient left a voicemail in the afternoon on 10/20, returned call to patient this morning.   Patient reported trouble swallowing food and feeling like food was stuck in her throat. She reports pain as the food moves down and also having throat pain at night.   She  has been eating only soft foods and reports no issues with liquids. No reported choking or near choking incidences.   This started on 12/07/21.   This could be the capecitabine causing GI lining irritation. Patient has used magic mouth wash in the past and plans on refilling her prescription. She will swish and swallow the mouthwash, then wait a min of 54mins before eating or drinking.  Will follow-up with patient tomorrow 12/16/21.  Of note, patient had recent PET [facility]) which showed "increased size and number of mediastinal and hilar lymph nodes. Index new lymph node in the left paratracheal region"   Darl Pikes, PharmD, BCPS, BCOP, CPP Hematology/Oncology Clinical Pharmacist [place]/DB/AP Oral [facility] [phone]  12/15/2021 12:50 PM

## 2021-12-15 NOTE — Telephone Encounter (Signed)
At the request of Beckey Rutter, NP, I called the patient to notify her to hold her Xeloda starting tomorrow.  Confirmed she is to return to see Dr. [name] on Thursday to discuss biopsy results.  Patient does have an appointment to see [name] on Monday, December 15, 2021.  Offered support.

## 2021-12-15 NOTE — Telephone Encounter (Signed)
Spoke to patient. Reviewed that preliminarily, biopsy is positive for carcinoma though not yet finalized. Dr. [name] has been updated and recommends MRI brain. Patient has f/u with Dr Cindee Lame at [facility] on Monday. She will tentatively have chemotherapy with trodelvy scheduled for Tuesday. She will stop Xeloda tomorrow. Dr Grayland Ormond has entered orders for treatment to allow for authorization and ordering. [name] will see patient on Thursday to review imaging, biopsy, plan in detail.

## 2021-12-16 ENCOUNTER — Inpatient Hospital Stay (HOSPITAL_BASED_OUTPATIENT_CLINIC_OR_DEPARTMENT_OTHER): Payer: 59 | Admitting: Hospice and Palliative Medicine

## 2021-12-16 ENCOUNTER — Encounter: Payer: Self-pay | Admitting: Family Medicine

## 2021-12-16 ENCOUNTER — Other Ambulatory Visit (HOSPITAL_COMMUNITY): Payer: Self-pay

## 2021-12-16 ENCOUNTER — Other Ambulatory Visit: Payer: Self-pay

## 2021-12-16 ENCOUNTER — Other Ambulatory Visit: Payer: Self-pay | Admitting: Family Medicine

## 2021-12-16 DIAGNOSIS — Z170 Estrogen receptor positive status [ER+]: Secondary | ICD-10-CM

## 2021-12-16 DIAGNOSIS — C50411 Malignant neoplasm of upper-outer quadrant of right female breast: Secondary | ICD-10-CM

## 2021-12-16 MED FILL — Albuterol Sulfate Inhal Aero 108 MCG/ACT (90MCG Base Equiv): RESPIRATORY_TRACT | 25 days supply | Qty: 6.7 | Fill #0 | Status: AC

## 2021-12-16 NOTE — Progress Notes (Signed)
Virtual Visit via Video Note  I connected with Amanda Skinner on 12/16/21 at  3:00 PM EDT by a video enabled telemedicine application and verified that I am speaking with the correct person using two identifiers.  Location: Patient: Home Provider: Clinic   I discussed the limitations of evaluation and management by telemedicine and the availability of in person appointments. The patient expressed understanding and agreed to proceed.  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   Observations/Objective: Patient was an add-on per request of Alyson, PharmD to discuss esophageal pain.  CT of the chest, abdomen, and pelvis on 11/27/2021 showed multiple new masses and nodules throughout the lungs consistent with pulmonary metastatic disease.  Patient has numerous mediastinal and hilar lymph node enlargement with centrally necrotic appearing paraesophageal nodes, which I suspect are the primary etiology of her esophageal discomfort.  She was started on Magic mouthwash reports that that has not significantly improved her symptoms.  Patient reports that she has had increasing pain with swallowing in the upper chest/lower throat.  Swallowing hard foods has become increasingly problematic.  She is able to drink with less difficulty.  Patient is not currently taking anything for pain.  She does have tramadol available at home.  Assessment and Plan: Dysphagia/odynophagia -I suspect that this is all secondary to bulky lymphadenopathy in the upper chest causing extrinsic compression to her esophagus.  Patient has follow-up on Thursday with Dr. [name] to discuss treatment options.  Discussed option of starting patient on steroids but patient ultimately preferred to wait until she saw Dr. [name].   Also offered to start pain medications but patient said that she had some tramadol leftover from her previous surgery and would like to try that first.  We will send referral to nutrition.  Follow Up Instructions: Patient to see Dr. [name] on Thursday   I discussed the assessment and treatment plan with the patient. The patient was provided an opportunity to ask questions and all were answered. The patient agreed with the plan and demonstrated an understanding of the instructions.   The patient was advised to call back or seek an in-person evaluation if the symptoms worsen or if the condition fails to improve as anticipated.  I provided 25 minutes of non-face-to-face time during this encounter.   Amanda Hong, NP

## 2021-12-17 LAB — SURGICAL PATHOLOGY

## 2021-12-18 ENCOUNTER — Ambulatory Visit: Payer: 59

## 2021-12-18 ENCOUNTER — Other Ambulatory Visit: Payer: Self-pay | Admitting: *Deleted

## 2021-12-18 ENCOUNTER — Encounter: Payer: Self-pay | Admitting: *Deleted

## 2021-12-18 ENCOUNTER — Telehealth: Payer: Self-pay | Admitting: *Deleted

## 2021-12-18 ENCOUNTER — Encounter: Payer: Self-pay | Admitting: Oncology

## 2021-12-18 ENCOUNTER — Inpatient Hospital Stay (HOSPITAL_BASED_OUTPATIENT_CLINIC_OR_DEPARTMENT_OTHER): Payer: 59 | Admitting: Oncology

## 2021-12-18 ENCOUNTER — Ambulatory Visit
Admission: RE | Admit: 2021-12-18 | Discharge: 2021-12-18 | Disposition: A | Discharge status: Home / Self Care | Payer: Self-pay | Source: Ambulatory Visit | Attending: Oncology | Admitting: Oncology

## 2021-12-18 VITALS — BP 106/50 | HR 89 | Temp 98.4°F | Resp 16 | Ht 59.0 in | Wt 132.0 lb

## 2021-12-18 DIAGNOSIS — Z170 Estrogen receptor positive status [ER+]: Secondary | ICD-10-CM

## 2021-12-18 DIAGNOSIS — R590 Localized enlarged lymph nodes: Secondary | ICD-10-CM

## 2021-12-18 DIAGNOSIS — R599 Enlarged lymph nodes, unspecified: Secondary | ICD-10-CM | POA: Diagnosis not present

## 2021-12-18 DIAGNOSIS — Z808 Family history of malignant neoplasm of other organs or systems: Secondary | ICD-10-CM | POA: Diagnosis not present

## 2021-12-18 DIAGNOSIS — Z79899 Other long term (current) drug therapy: Secondary | ICD-10-CM | POA: Diagnosis not present

## 2021-12-18 DIAGNOSIS — C773 Secondary and unspecified malignant neoplasm of axilla and upper limb lymph nodes: Secondary | ICD-10-CM | POA: Diagnosis not present

## 2021-12-18 DIAGNOSIS — C50411 Malignant neoplasm of upper-outer quadrant of right female breast: Secondary | ICD-10-CM | POA: Diagnosis not present

## 2021-12-18 DIAGNOSIS — R221 Localized swelling, mass and lump, neck: Secondary | ICD-10-CM | POA: Diagnosis not present

## 2021-12-18 MED ORDER — SEREVENT DISKUS 50 MCG/ACT IN AEPB
1.0000 | INHALATION_SPRAY | Freq: Two times a day (BID) | RESPIRATORY_TRACT | 3 refills | Status: AC
  Filled 2021-12-18: qty 60, 30d supply, fill #0

## 2021-12-18 MED ORDER — TRAMADOL HCL 50 MG PO TABS
50.0000 mg | ORAL_TABLET | Freq: Three times a day (TID) | ORAL | 0 refills | Status: DC | PRN
  Filled 2021-12-18: qty 60, 20d supply, fill #0

## 2021-12-18 NOTE — Telephone Encounter (Signed)
Request to get PET scan images from [facility]  on 12/11/2021 to share with [facility] through powershare. Request was sent in today

## 2021-12-18 NOTE — Progress Notes (Signed)
Outside

## 2021-12-19 ENCOUNTER — Other Ambulatory Visit: Payer: Self-pay

## 2021-12-19 ENCOUNTER — Encounter: Payer: Self-pay | Admitting: Oncology
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
## 2021-12-21 ENCOUNTER — Encounter: Payer: Self-pay | Admitting: Oncology

## 2021-12-22 ENCOUNTER — Encounter: Payer: Self-pay | Admitting: Oncology

## 2021-12-22 ENCOUNTER — Ambulatory Visit: Admission: RE | Admit: 2021-12-22 | Payer: 59 | Source: Ambulatory Visit

## 2021-12-22 ENCOUNTER — Telehealth: Payer: Self-pay | Admitting: *Deleted

## 2021-12-22 ENCOUNTER — Other Ambulatory Visit: Payer: Self-pay | Admitting: Oncology

## 2021-12-22 ENCOUNTER — Other Ambulatory Visit: Payer: Self-pay

## 2021-12-22 DIAGNOSIS — Z170 Estrogen receptor positive status [ER+]: Secondary | ICD-10-CM | POA: Diagnosis not present

## 2021-12-22 DIAGNOSIS — C50411 Malignant neoplasm of upper-outer quadrant of right female breast: Secondary | ICD-10-CM | POA: Diagnosis not present

## 2021-12-22 DIAGNOSIS — C50911 Malignant neoplasm of unspecified site of right female breast: Secondary | ICD-10-CM | POA: Diagnosis not present

## 2021-12-22 MED ORDER — OXYCODONE HCL 5 MG PO TABS
5.0000 mg | ORAL_TABLET | Freq: Four times a day (QID) | ORAL | 0 refills | Status: DC | PRN
  Filled 2021-12-22: qty 120, 30d supply, fill #0

## 2021-12-22 MED ORDER — PREDNISOLONE SODIUM PHOSPHATE 15 MG/5ML PO SOLN
ORAL | 0 refills | Status: DC
  Filled 2021-12-22: qty 140, 7d supply, fill #0

## 2021-12-22 MED FILL — Fosaprepitant Dimeglumine For IV Infusion 150 MG (Base Eq): INTRAVENOUS | Qty: 5 | Status: AC

## 2021-12-22 MED FILL — Dexamethasone Sodium Phosphate Inj 100 MG/10ML: INTRAMUSCULAR | Qty: 1 | Status: AC

## 2021-12-22 NOTE — Telephone Encounter (Signed)
Form completed and sent up for physician signature

## 2021-12-22 NOTE — Telephone Encounter (Signed)
Received fax from [employer] on this patient. Awaiting patient response as to continuous or intermittent leave is requested

## 2021-12-22 NOTE — Telephone Encounter (Signed)
Called the dentist and got voicemail and left message to call back for dental clearance for pt to get biphosphonates. I left my phone and fax number and asked them what the office fax #

## 2021-12-23 ENCOUNTER — Encounter: Payer: Self-pay | Admitting: Oncology

## 2021-12-23 ENCOUNTER — Ambulatory Visit
Admission: RE | Admit: 2021-12-23 | Discharge: 2021-12-23 | Disposition: A | Discharge status: Home / Self Care | Payer: 59 | Source: Ambulatory Visit | Attending: Radiation Oncology | Admitting: Radiation Oncology

## 2021-12-23 ENCOUNTER — Other Ambulatory Visit: Payer: Self-pay

## 2021-12-23 ENCOUNTER — Inpatient Hospital Stay: Payer: 59

## 2021-12-23 ENCOUNTER — Encounter: Payer: Self-pay | Admitting: Radiation Oncology

## 2021-12-23 ENCOUNTER — Other Ambulatory Visit: Payer: Self-pay | Admitting: Family Medicine

## 2021-12-23 ENCOUNTER — Ambulatory Visit
Admission: RE | Admit: 2021-12-23 | Discharge: 2021-12-23 | Disposition: A | Discharge status: Home / Self Care | Payer: Self-pay | Source: Ambulatory Visit | Attending: Radiation Oncology | Admitting: Radiation Oncology

## 2021-12-23 ENCOUNTER — Ambulatory Visit: Payer: 59 | Admitting: Oncology

## 2021-12-23 VITALS — BP 112/73 | HR 108 | Temp 97.0°F | Resp 20 | Wt 130.0 lb

## 2021-12-23 DIAGNOSIS — Z170 Estrogen receptor positive status [ER+]: Secondary | ICD-10-CM | POA: Diagnosis not present

## 2021-12-23 DIAGNOSIS — C50411 Malignant neoplasm of upper-outer quadrant of right female breast: Secondary | ICD-10-CM | POA: Diagnosis not present

## 2021-12-23 DIAGNOSIS — C7802 Secondary malignant neoplasm of left lung: Secondary | ICD-10-CM | POA: Insufficient documentation

## 2021-12-23 DIAGNOSIS — C779 Secondary and unspecified malignant neoplasm of lymph node, unspecified: Secondary | ICD-10-CM

## 2021-12-23 DIAGNOSIS — Z171 Estrogen receptor negative status [ER-]: Secondary | ICD-10-CM | POA: Insufficient documentation

## 2021-12-23 DIAGNOSIS — C7951 Secondary malignant neoplasm of bone: Secondary | ICD-10-CM | POA: Insufficient documentation

## 2021-12-23 MED FILL — Fluticasone Propionate Nasal Susp 50 MCG/ACT: NASAL | 30 days supply | Qty: 16 | Fill #0 | Status: AC

## 2021-12-23 NOTE — Progress Notes (Signed)
Radiation Oncology Follow up Note old patient new area progressive metastatic disease  Name: Amanda Skinner   Date:   12/23/2021 MRN:  [id number] DOB: 12/21/1960    This 61 y.o. 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  She has gone on to have increasing dysphagia with CT scans of her chest.  Showing multiple new areas of nodules throughout the lungs consistent with pulmonary metastatic disease as well as mediastinal hilar nodes enlarged with central necrotic paraesophageal nodes consistent with worsening nodal metastatic disease.  This is causing some extrinsic compression of her esophagus.  She is also having right humeral pain and PET CT scan done at [facility] shows activity in a metastatic disease in the right proximal humerus.  She is seen today for reevaluation for possible palliative radiation therapy to both her chest and right humerus.  Do not have her PET scan available at this time.  COMPLICATIONS OF TREATMENT: none  FOLLOW UP COMPLIANCE: keeps appointments   PHYSICAL EXAM:  BP 112/73 (BP Location: Left Arm, Patient Position: Sitting, Cuff Size: Normal)   Pulse (!) 108   Temp (!) 97 F (36.1 C) (Tympanic)   Resp 20   Wt 130 lb (59 kg)   LMP 06/15/2011 (Approximate)   BMI 26.26 kg/m  Patient has prominent left supraclavicular adenopathy.  Pain is elicited on deep palpation of her right humerus.  Well-developed well-nourished patient in NAD. HEENT reveals PERLA, EOMI, discs not visualized.  Oral cavity is clear. No oral mucosal  lesions are identified. Neck is clear without evidence of cervical or supraclavicular adenopathy. Lungs are clear to A&P. Cardiac examination is essentially unremarkable with regular rate and rhythm without murmur rub or thrill. Abdomen is benign with no organomegaly or masses noted. Motor sensory and DTR levels are equal and symmetric in the upper and lower extremities. Cranial nerves II through XII are grossly intact. Proprioception is intact. No peripheral adenopathy or edema is identified. No motor or sensory levels are noted. Crude visual fields are within normal range.  RADIOLOGY RESULTS: PET scan has been requested for my review  PLAN: At this time elect go with palliative radiation therapy to her chest to try to alleviate some the extrinsic compression on her esophagus.  We will plan on delivering 40 Gray in 20 fractions using 3-dimensional treatment planning.  Would use PET scan as my guide to set up treatment fields.  Would also give a single fraction of 800 cGy in 1 fraction to her proximal right humeral area of involvement.  I have tentatively set up simulation for later this week after I receive urgently her PET CT scan from [facility].  Patient comprehends my recommendations well.  I would like to take this opportunity to thank you for allowing me to participate in the care of your patient.Noreene Filbert, MD

## 2021-12-23 NOTE — Telephone Encounter (Signed)
Completed FMLA from faxed to [facility] [employer] [facility]

## 2021-12-24 ENCOUNTER — Ambulatory Visit: Payer: 59 | Admitting: Oncology

## 2021-12-24 ENCOUNTER — Other Ambulatory Visit: Payer: 59

## 2021-12-24 ENCOUNTER — Institutional Professional Consult (permissible substitution): Payer: 59 | Admitting: Radiation Oncology

## 2021-12-24 ENCOUNTER — Ambulatory Visit: Payer: 59

## 2021-12-24 DIAGNOSIS — Z171 Estrogen receptor negative status [ER-]: Secondary | ICD-10-CM | POA: Diagnosis not present

## 2021-12-24 DIAGNOSIS — C7802 Secondary malignant neoplasm of left lung: Secondary | ICD-10-CM | POA: Diagnosis not present

## 2021-12-24 DIAGNOSIS — C7951 Secondary malignant neoplasm of bone: Secondary | ICD-10-CM | POA: Diagnosis not present

## 2021-12-24 DIAGNOSIS — C50411 Malignant neoplasm of upper-outer quadrant of right female breast: Secondary | ICD-10-CM | POA: Diagnosis not present

## 2021-12-25 ENCOUNTER — Other Ambulatory Visit: Payer: Self-pay

## 2021-12-25 ENCOUNTER — Ambulatory Visit
Admission: RE | Admit: 2021-12-25 | Discharge: 2021-12-25 | Disposition: A | Discharge status: Home / Self Care | Payer: 59 | Source: Ambulatory Visit | Attending: Radiation Oncology | Admitting: Radiation Oncology

## 2021-12-25 ENCOUNTER — Telehealth: Payer: Self-pay | Admitting: *Deleted

## 2021-12-25 DIAGNOSIS — Z780 Asymptomatic menopausal state: Secondary | ICD-10-CM | POA: Diagnosis not present

## 2021-12-25 DIAGNOSIS — C7802 Secondary malignant neoplasm of left lung: Secondary | ICD-10-CM | POA: Insufficient documentation

## 2021-12-25 DIAGNOSIS — Z9981 Dependence on supplemental oxygen: Secondary | ICD-10-CM | POA: Diagnosis not present

## 2021-12-25 DIAGNOSIS — Z171 Estrogen receptor negative status [ER-]: Secondary | ICD-10-CM | POA: Insufficient documentation

## 2021-12-25 DIAGNOSIS — C7951 Secondary malignant neoplasm of bone: Secondary | ICD-10-CM | POA: Insufficient documentation

## 2021-12-25 DIAGNOSIS — Z808 Family history of malignant neoplasm of other organs or systems: Secondary | ICD-10-CM | POA: Diagnosis not present

## 2021-12-25 DIAGNOSIS — G893 Neoplasm related pain (acute) (chronic): Secondary | ICD-10-CM | POA: Diagnosis not present

## 2021-12-25 DIAGNOSIS — C50411 Malignant neoplasm of upper-outer quadrant of right female breast: Secondary | ICD-10-CM | POA: Insufficient documentation

## 2021-12-25 DIAGNOSIS — C7801 Secondary malignant neoplasm of right lung: Secondary | ICD-10-CM | POA: Diagnosis not present

## 2021-12-25 DIAGNOSIS — Z79899 Other long term (current) drug therapy: Secondary | ICD-10-CM | POA: Diagnosis not present

## 2021-12-25 DIAGNOSIS — J9601 Acute respiratory failure with hypoxia: Secondary | ICD-10-CM | POA: Diagnosis not present

## 2021-12-25 DIAGNOSIS — M79602 Pain in left arm: Secondary | ICD-10-CM | POA: Diagnosis not present

## 2021-12-25 NOTE — Telephone Encounter (Signed)
Received Disability form from the [facility], completed and sent up for doctor signature

## 2021-12-26 ENCOUNTER — Other Ambulatory Visit: Payer: Self-pay

## 2021-12-26 ENCOUNTER — Other Ambulatory Visit: Payer: Self-pay | Admitting: *Deleted

## 2021-12-26 ENCOUNTER — Encounter: Payer: Self-pay | Admitting: Oncology

## 2021-12-29 ENCOUNTER — Encounter: Payer: Self-pay | Admitting: Oncology

## 2021-12-29 ENCOUNTER — Ambulatory Visit: Payer: 59

## 2021-12-29 ENCOUNTER — Telehealth: Payer: Self-pay | Admitting: *Deleted

## 2021-12-29 DIAGNOSIS — C7801 Secondary malignant neoplasm of right lung: Secondary | ICD-10-CM | POA: Diagnosis not present

## 2021-12-29 DIAGNOSIS — C7802 Secondary malignant neoplasm of left lung: Secondary | ICD-10-CM | POA: Diagnosis not present

## 2021-12-29 DIAGNOSIS — C50411 Malignant neoplasm of upper-outer quadrant of right female breast: Secondary | ICD-10-CM | POA: Diagnosis not present

## 2021-12-29 DIAGNOSIS — M79602 Pain in left arm: Secondary | ICD-10-CM | POA: Diagnosis not present

## 2021-12-29 DIAGNOSIS — Z9981 Dependence on supplemental oxygen: Secondary | ICD-10-CM | POA: Diagnosis not present

## 2021-12-29 DIAGNOSIS — J9601 Acute respiratory failure with hypoxia: Secondary | ICD-10-CM | POA: Diagnosis not present

## 2021-12-29 DIAGNOSIS — G893 Neoplasm related pain (acute) (chronic): Secondary | ICD-10-CM | POA: Diagnosis not present

## 2021-12-29 DIAGNOSIS — C7951 Secondary malignant neoplasm of bone: Secondary | ICD-10-CM | POA: Diagnosis not present

## 2021-12-29 DIAGNOSIS — Z171 Estrogen receptor negative status [ER-]: Secondary | ICD-10-CM | POA: Diagnosis not present

## 2021-12-29 NOTE — Telephone Encounter (Signed)
FMLA for patient was already completed and faxed back to [facility]

## 2021-12-29 NOTE — Telephone Encounter (Signed)
FMLA from [employer] for FMLA for patient and also a separate one for her daughter Amanda Skinner

## 2021-12-29 NOTE — Telephone Encounter (Addendum)
Amanda Skinner's FMLA form completed and signed by physician She was notified that she needs to come and sign the form before I fax it to [employer]. She agreed to come and sign it

## 2021-12-29 NOTE — Telephone Encounter (Signed)
Disability form signed and faxed back to [facility]

## 2021-12-30 ENCOUNTER — Other Ambulatory Visit: Payer: Self-pay

## 2021-12-30 ENCOUNTER — Encounter: Payer: Self-pay | Admitting: *Deleted

## 2021-12-30 ENCOUNTER — Ambulatory Visit: Payer: 59

## 2021-12-30 ENCOUNTER — Other Ambulatory Visit: Payer: Self-pay | Admitting: *Deleted

## 2021-12-30 ENCOUNTER — Other Ambulatory Visit: Payer: Self-pay | Admitting: Oncology

## 2021-12-30 ENCOUNTER — Ambulatory Visit
Admission: RE | Admit: 2021-12-30 | Discharge: 2021-12-30 | Disposition: A | Discharge status: Home / Self Care | Payer: 59 | Source: Ambulatory Visit | Attending: Radiation Oncology | Admitting: Radiation Oncology

## 2021-12-30 ENCOUNTER — Encounter: Payer: Self-pay | Admitting: Oncology

## 2021-12-30 ENCOUNTER — Inpatient Hospital Stay: Payer: 59

## 2021-12-30 ENCOUNTER — Inpatient Hospital Stay: Payer: 59 | Attending: Oncology

## 2021-12-30 ENCOUNTER — Inpatient Hospital Stay (HOSPITAL_BASED_OUTPATIENT_CLINIC_OR_DEPARTMENT_OTHER): Payer: 59 | Admitting: Hospice and Palliative Medicine

## 2021-12-30 ENCOUNTER — Telehealth: Payer: Self-pay | Admitting: *Deleted

## 2021-12-30 ENCOUNTER — Ambulatory Visit: Payer: 59 | Admitting: Oncology

## 2021-12-30 ENCOUNTER — Other Ambulatory Visit (HOSPITAL_BASED_OUTPATIENT_CLINIC_OR_DEPARTMENT_OTHER): Payer: Self-pay

## 2021-12-30 VITALS — HR 95 | Temp 98.9°F | Resp 24 | Ht 59.0 in | Wt 132.0 lb

## 2021-12-30 DIAGNOSIS — R0602 Shortness of breath: Secondary | ICD-10-CM | POA: Diagnosis not present

## 2021-12-30 DIAGNOSIS — C7802 Secondary malignant neoplasm of left lung: Secondary | ICD-10-CM | POA: Diagnosis not present

## 2021-12-30 DIAGNOSIS — Z170 Estrogen receptor positive status [ER+]: Secondary | ICD-10-CM | POA: Diagnosis not present

## 2021-12-30 DIAGNOSIS — G893 Neoplasm related pain (acute) (chronic): Secondary | ICD-10-CM | POA: Diagnosis not present

## 2021-12-30 DIAGNOSIS — C50411 Malignant neoplasm of upper-outer quadrant of right female breast: Secondary | ICD-10-CM | POA: Diagnosis not present

## 2021-12-30 DIAGNOSIS — Z171 Estrogen receptor negative status [ER-]: Secondary | ICD-10-CM | POA: Diagnosis not present

## 2021-12-30 DIAGNOSIS — Z9981 Dependence on supplemental oxygen: Secondary | ICD-10-CM | POA: Insufficient documentation

## 2021-12-30 DIAGNOSIS — M79602 Pain in left arm: Secondary | ICD-10-CM | POA: Insufficient documentation

## 2021-12-30 DIAGNOSIS — C7801 Secondary malignant neoplasm of right lung: Secondary | ICD-10-CM | POA: Diagnosis not present

## 2021-12-30 DIAGNOSIS — J9601 Acute respiratory failure with hypoxia: Secondary | ICD-10-CM | POA: Diagnosis not present

## 2021-12-30 DIAGNOSIS — C7951 Secondary malignant neoplasm of bone: Secondary | ICD-10-CM | POA: Diagnosis not present

## 2021-12-30 DIAGNOSIS — Z79899 Other long term (current) drug therapy: Secondary | ICD-10-CM | POA: Insufficient documentation

## 2021-12-30 DIAGNOSIS — Z95828 Presence of other vascular implants and grafts: Secondary | ICD-10-CM

## 2021-12-30 DIAGNOSIS — Z510 Encounter for antineoplastic radiation therapy: Secondary | ICD-10-CM | POA: Diagnosis not present

## 2021-12-30 DIAGNOSIS — Z780 Asymptomatic menopausal state: Secondary | ICD-10-CM | POA: Insufficient documentation

## 2021-12-30 DIAGNOSIS — Z808 Family history of malignant neoplasm of other organs or systems: Secondary | ICD-10-CM | POA: Insufficient documentation

## 2021-12-30 LAB — RAD ONC ARIA SESSION SUMMARY
Course Elapsed Days: 0
Plan Fractions Treated to Date: 1
Plan Prescribed Dose Per Fraction: 2 Gy
Plan Total Fractions Prescribed: 20
Plan Total Prescribed Dose: 40 Gy
Reference Point Dosage Given to Date: 2 Gy
Reference Point Session Dosage Given: 2 Gy
Session Number: 1

## 2021-12-30 LAB — CBC WITH DIFFERENTIAL/PLATELET
Abs Immature Granulocytes: 0.04 10*3/uL (ref 0.00–0.07)
Basophils Absolute: 0 10*3/uL (ref 0.0–0.1)
Basophils Relative: 0 %
Eosinophils Absolute: 0 10*3/uL (ref 0.0–0.5)
Eosinophils Relative: 0 %
HCT: 38.1 % (ref 36.0–46.0)
Hemoglobin: 13.2 g/dL (ref 12.0–15.0)
Immature Granulocytes: 0 %
Lymphocytes Relative: 8 %
Lymphs Abs: 0.8 10*3/uL (ref 0.7–4.0)
MCH: 33.8 pg (ref 26.0–34.0)
MCHC: 34.6 g/dL (ref 30.0–36.0)
MCV: 97.4 fL (ref 80.0–100.0)
Monocytes Absolute: 0.9 10*3/uL (ref 0.1–1.0)
Monocytes Relative: 9 %
Neutro Abs: 9 10*3/uL — ABNORMAL HIGH (ref 1.7–7.7)
Neutrophils Relative %: 83 %
Platelets: 208 10*3/uL (ref 150–400)
RBC: 3.91 MIL/uL (ref 3.87–5.11)
RDW: 17.9 % — ABNORMAL HIGH (ref 11.5–15.5)
WBC: 10.8 10*3/uL — ABNORMAL HIGH (ref 4.0–10.5)
nRBC: 0 % (ref 0.0–0.2)

## 2021-12-30 LAB — COMPREHENSIVE METABOLIC PANEL
ALT: 13 U/L (ref 0–44)
AST: 18 U/L (ref 15–41)
Albumin: 3.9 g/dL (ref 3.5–5.0)
Alkaline Phosphatase: 89 U/L (ref 38–126)
Anion gap: 10 (ref 5–15)
BUN: 12 mg/dL (ref 8–23)
CO2: 28 mmol/L (ref 22–32)
Calcium: 9.2 mg/dL (ref 8.9–10.3)
Chloride: 97 mmol/L — ABNORMAL LOW (ref 98–111)
Creatinine, Ser: 0.79 mg/dL (ref 0.44–1.00)
GFR, Estimated: >60 mL/min (ref >60)
Glucose, Bld: 128 mg/dL — ABNORMAL HIGH (ref 70–99)
Potassium: 4 mmol/L (ref 3.5–5.1)
Sodium: 135 mmol/L (ref 135–145)
Total Bilirubin: 0.6 mg/dL (ref 0.3–1.2)
Total Protein: 6.9 g/dL (ref 6.5–8.1)

## 2021-12-30 MED ORDER — PREDNISONE INTENSOL 5 MG/ML PO CONC
ORAL | 0 refills | Status: DC
  Filled 2021-12-30: qty 90, 7d supply, fill #0
  Filled 2021-12-31: qty 180, 21d supply, fill #1

## 2021-12-30 MED ORDER — HEPARIN SOD (PORK) LOCK FLUSH 100 UNIT/ML IV SOLN
500.0000 [IU] | Freq: Once | INTRAVENOUS | Status: AC
  Administered 2021-12-30: 500 [IU]
  Filled 2021-12-30: qty 5

## 2021-12-30 MED ORDER — SODIUM CHLORIDE 0.9% FLUSH
10.0000 mL | Freq: Once | INTRAVENOUS | Status: AC
  Administered 2021-12-30: 10 mL via INTRAVENOUS
  Filled 2021-12-30: qty 10

## 2021-12-30 MED ORDER — IPRATROPIUM-ALBUTEROL 0.5-2.5 (3) MG/3ML IN SOLN
3.0000 mL | RESPIRATORY_TRACT | 1 refills | Status: DC | PRN
  Filled 2021-12-30: qty 270, 15d supply, fill #0
  Filled 2021-12-30: qty 90, 5d supply, fill #0

## 2021-12-30 MED ORDER — MORPHINE SULFATE (CONCENTRATE) 10 MG /0.5 ML PO SOLN
5.0000 mg | ORAL | 0 refills | Status: DC | PRN
  Filled 2021-12-30 (×2): qty 30, 10d supply, fill #0

## 2021-12-30 NOTE — Telephone Encounter (Signed)
Patient called reporting that her painis worse and is now present during day and not just at night. The Oxycodone is not helping with pain. She also notes that her breathing has changed and that her stridor is worse too. Her pain has been in her right posterior chest just below the scapula and now it has moved up just a bit, she states that she feels like someone has and is punching her in that area. Her night time O2 sats has been dropping to 80%. Please advise

## 2021-12-30 NOTE — Progress Notes (Signed)
Nutrition  Patient scheduled for nutrition appointment today but needed to be seen in [facility].  Not appropriate to see at this time.  Will follow-up at later date.   Moselle Rister B. Zenia Resides, [place], [place] Registered Dietitian [phone]
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
## 2021-12-30 NOTE — Telephone Encounter (Signed)
Amanda Skinner came and signed form and it has been faxed to [employer] and confirmation of fax received

## 2021-12-30 NOTE — Telephone Encounter (Signed)
Patient will need to be evaluated in [facility] with port labs- cbc/metc

## 2021-12-31 ENCOUNTER — Inpatient Hospital Stay: Payer: 59 | Admitting: Oncology

## 2021-12-31 ENCOUNTER — Telehealth: Payer: Self-pay | Admitting: *Deleted

## 2021-12-31 ENCOUNTER — Ambulatory Visit: Payer: 59

## 2021-12-31 ENCOUNTER — Encounter: Payer: Self-pay | Admitting: *Deleted

## 2021-12-31 ENCOUNTER — Ambulatory Visit: Payer: 59 | Admitting: Occupational Therapy

## 2021-12-31 ENCOUNTER — Other Ambulatory Visit (HOSPITAL_BASED_OUTPATIENT_CLINIC_OR_DEPARTMENT_OTHER): Payer: Self-pay

## 2021-12-31 ENCOUNTER — Other Ambulatory Visit: Payer: Self-pay

## 2021-12-31 ENCOUNTER — Inpatient Hospital Stay: Payer: 59

## 2021-12-31 ENCOUNTER — Ambulatory Visit
Admission: RE | Admit: 2021-12-31 | Discharge: 2021-12-31 | Disposition: A | Discharge status: Home / Self Care | Payer: 59 | Source: Ambulatory Visit | Attending: Radiation Oncology | Admitting: Radiation Oncology

## 2021-12-31 ENCOUNTER — Inpatient Hospital Stay: Payer: 59 | Admitting: Occupational Therapy

## 2021-12-31 ENCOUNTER — Other Ambulatory Visit: Payer: 59

## 2021-12-31 ENCOUNTER — Ambulatory Visit: Payer: 59 | Admitting: Oncology

## 2021-12-31 DIAGNOSIS — J9601 Acute respiratory failure with hypoxia: Secondary | ICD-10-CM | POA: Diagnosis not present

## 2021-12-31 DIAGNOSIS — C3491 Malignant neoplasm of unspecified part of right bronchus or lung: Secondary | ICD-10-CM | POA: Diagnosis not present

## 2021-12-31 DIAGNOSIS — C7801 Secondary malignant neoplasm of right lung: Secondary | ICD-10-CM | POA: Diagnosis not present

## 2021-12-31 DIAGNOSIS — Z171 Estrogen receptor negative status [ER-]: Secondary | ICD-10-CM | POA: Diagnosis not present

## 2021-12-31 DIAGNOSIS — C7951 Secondary malignant neoplasm of bone: Secondary | ICD-10-CM | POA: Diagnosis not present

## 2021-12-31 DIAGNOSIS — Z9981 Dependence on supplemental oxygen: Secondary | ICD-10-CM | POA: Diagnosis not present

## 2021-12-31 DIAGNOSIS — Z510 Encounter for antineoplastic radiation therapy: Secondary | ICD-10-CM | POA: Diagnosis not present

## 2021-12-31 DIAGNOSIS — C7802 Secondary malignant neoplasm of left lung: Secondary | ICD-10-CM | POA: Diagnosis not present

## 2021-12-31 DIAGNOSIS — G893 Neoplasm related pain (acute) (chronic): Secondary | ICD-10-CM | POA: Diagnosis not present

## 2021-12-31 DIAGNOSIS — C50411 Malignant neoplasm of upper-outer quadrant of right female breast: Secondary | ICD-10-CM | POA: Diagnosis not present

## 2021-12-31 DIAGNOSIS — M79602 Pain in left arm: Secondary | ICD-10-CM | POA: Diagnosis not present

## 2021-12-31 DIAGNOSIS — R0602 Shortness of breath: Secondary | ICD-10-CM | POA: Diagnosis not present

## 2021-12-31 LAB — RAD ONC ARIA SESSION SUMMARY
Course Elapsed Days: 1
Plan Fractions Treated to Date: 2
Plan Prescribed Dose Per Fraction: 2 Gy
Plan Total Fractions Prescribed: 20
Plan Total Prescribed Dose: 40 Gy
Reference Point Dosage Given to Date: 4 Gy
Reference Point Session Dosage Given: 2 Gy
Session Number: 2

## 2021-12-31 NOTE — Telephone Encounter (Signed)
At 5pm last evening. Attempted to do a PA for patient's morphine concentrate via covermymeds.  Received msg in workque- "Additional Information Required-This drug/product is not covered under the pharmacy benefit. Prior Authorization is not available."  [employer].  Spoke with [employer]. She stated that this med is covered under the patient's insurance. However, the pharmacy had pushed the med through under 100 mg instead of the 10 mg dosing and this is probably why a PA was required.' I spoke with [facility] pharmacist krisiti  Per pharmacist - The manufacturer has the medication listed as 100mg /30ml which is the same concentration as 10mg /0.87ml. [employer] also has the drug listed as Morphine Concentrate 100mg /19ml. RN will review with insurance and try to resubmit this PA process.

## 2021-12-31 NOTE — Telephone Encounter (Addendum)
Resubmitted PA Lakyia Spink Key: [id number] - PA Case ID: [id number]  Morphine Sulfate (Concentrate) 100MG /5ML solution

## 2021-12-31 NOTE — Telephone Encounter (Signed)
Call placed to [facility] at [place] yesterday afternoon regarding need for urgent O2 and nebulizer machine needed in patients home. Orders placed by Billey Chang, NP and progress note with O2 saturation completed. Call return from [place] this morning that they have been unable to locate electronic order or documentation of O2 saturation and qualifying diagnosis.   Orders, demographics, diagnosis with codes and progress note with qualifying O2 saturation faxed to [employer]. Confirmation received.

## 2022-01-01 ENCOUNTER — Other Ambulatory Visit: Payer: Self-pay

## 2022-01-01 ENCOUNTER — Ambulatory Visit: Payer: 59

## 2022-01-01 ENCOUNTER — Telehealth: Payer: Self-pay | Admitting: *Deleted

## 2022-01-01 ENCOUNTER — Ambulatory Visit
Admission: RE | Admit: 2022-01-01 | Discharge: 2022-01-01 | Disposition: A | Discharge status: Home / Self Care | Payer: 59 | Source: Ambulatory Visit | Attending: Hospice and Palliative Medicine | Admitting: Hospice and Palliative Medicine

## 2022-01-01 ENCOUNTER — Encounter: Payer: Self-pay | Admitting: Oncology

## 2022-01-01 ENCOUNTER — Inpatient Hospital Stay: Payer: 59

## 2022-01-01 ENCOUNTER — Encounter: Payer: Self-pay | Admitting: Hospice and Palliative Medicine

## 2022-01-01 ENCOUNTER — Ambulatory Visit
Admission: RE | Admit: 2022-01-01 | Discharge: 2022-01-01 | Disposition: A | Discharge status: Home / Self Care | Payer: 59 | Source: Ambulatory Visit | Attending: Radiation Oncology | Admitting: Radiation Oncology

## 2022-01-01 ENCOUNTER — Inpatient Hospital Stay (HOSPITAL_BASED_OUTPATIENT_CLINIC_OR_DEPARTMENT_OTHER): Payer: 59 | Admitting: Hospice and Palliative Medicine

## 2022-01-01 VITALS — BP 111/66 | HR 109 | Temp 97.9°F | Resp 22

## 2022-01-01 DIAGNOSIS — R0602 Shortness of breath: Secondary | ICD-10-CM

## 2022-01-01 DIAGNOSIS — Z171 Estrogen receptor negative status [ER-]: Secondary | ICD-10-CM | POA: Diagnosis not present

## 2022-01-01 DIAGNOSIS — K1230 Oral mucositis (ulcerative), unspecified: Secondary | ICD-10-CM | POA: Diagnosis not present

## 2022-01-01 DIAGNOSIS — C50411 Malignant neoplasm of upper-outer quadrant of right female breast: Secondary | ICD-10-CM | POA: Diagnosis not present

## 2022-01-01 DIAGNOSIS — Z95828 Presence of other vascular implants and grafts: Secondary | ICD-10-CM

## 2022-01-01 DIAGNOSIS — J90 Pleural effusion, not elsewhere classified: Secondary | ICD-10-CM | POA: Diagnosis not present

## 2022-01-01 DIAGNOSIS — Z170 Estrogen receptor positive status [ER+]: Secondary | ICD-10-CM

## 2022-01-01 DIAGNOSIS — R911 Solitary pulmonary nodule: Secondary | ICD-10-CM | POA: Diagnosis not present

## 2022-01-01 DIAGNOSIS — C7802 Secondary malignant neoplasm of left lung: Secondary | ICD-10-CM | POA: Diagnosis not present

## 2022-01-01 DIAGNOSIS — R051 Acute cough: Secondary | ICD-10-CM

## 2022-01-01 DIAGNOSIS — G893 Neoplasm related pain (acute) (chronic): Secondary | ICD-10-CM

## 2022-01-01 DIAGNOSIS — M79602 Pain in left arm: Secondary | ICD-10-CM | POA: Diagnosis not present

## 2022-01-01 DIAGNOSIS — C7951 Secondary malignant neoplasm of bone: Secondary | ICD-10-CM | POA: Diagnosis not present

## 2022-01-01 DIAGNOSIS — R110 Nausea: Secondary | ICD-10-CM | POA: Insufficient documentation

## 2022-01-01 DIAGNOSIS — Z9981 Dependence on supplemental oxygen: Secondary | ICD-10-CM | POA: Diagnosis not present

## 2022-01-01 DIAGNOSIS — J9601 Acute respiratory failure with hypoxia: Secondary | ICD-10-CM | POA: Diagnosis not present

## 2022-01-01 DIAGNOSIS — R112 Nausea with vomiting, unspecified: Secondary | ICD-10-CM

## 2022-01-01 DIAGNOSIS — C7801 Secondary malignant neoplasm of right lung: Secondary | ICD-10-CM | POA: Diagnosis not present

## 2022-01-01 DIAGNOSIS — R1310 Dysphagia, unspecified: Secondary | ICD-10-CM | POA: Insufficient documentation

## 2022-01-01 DIAGNOSIS — Z510 Encounter for antineoplastic radiation therapy: Secondary | ICD-10-CM | POA: Diagnosis not present

## 2022-01-01 DIAGNOSIS — R059 Cough, unspecified: Secondary | ICD-10-CM | POA: Diagnosis not present

## 2022-01-01 LAB — RAD ONC ARIA SESSION SUMMARY
Course Elapsed Days: 2
Plan Fractions Treated to Date: 3
Plan Prescribed Dose Per Fraction: 2 Gy
Plan Total Fractions Prescribed: 20
Plan Total Prescribed Dose: 40 Gy
Reference Point Dosage Given to Date: 6 Gy
Reference Point Session Dosage Given: 2 Gy
Session Number: 3

## 2022-01-01 LAB — CBC WITH DIFFERENTIAL/PLATELET
Abs Immature Granulocytes: 0.08 10*3/uL — ABNORMAL HIGH (ref 0.00–0.07)
Basophils Absolute: 0 10*3/uL (ref 0.0–0.1)
Basophils Relative: 0 %
Eosinophils Absolute: 0 10*3/uL (ref 0.0–0.5)
Eosinophils Relative: 0 %
HCT: 39.9 % (ref 36.0–46.0)
Hemoglobin: 13.7 g/dL (ref 12.0–15.0)
Immature Granulocytes: 1 %
Lymphocytes Relative: 3 %
Lymphs Abs: 0.5 10*3/uL — ABNORMAL LOW (ref 0.7–4.0)
MCH: 33.3 pg (ref 26.0–34.0)
MCHC: 34.3 g/dL (ref 30.0–36.0)
MCV: 97.1 fL (ref 80.0–100.0)
Monocytes Absolute: 1.1 10*3/uL — ABNORMAL HIGH (ref 0.1–1.0)
Monocytes Relative: 7 %
Neutro Abs: 14.8 10*3/uL — ABNORMAL HIGH (ref 1.7–7.7)
Neutrophils Relative %: 89 %
Platelets: 217 10*3/uL (ref 150–400)
RBC: 4.11 MIL/uL (ref 3.87–5.11)
RDW: 17.7 % — ABNORMAL HIGH (ref 11.5–15.5)
WBC: 16.5 10*3/uL — ABNORMAL HIGH (ref 4.0–10.5)
nRBC: 0 % (ref 0.0–0.2)

## 2022-01-01 LAB — COMPREHENSIVE METABOLIC PANEL
ALT: 15 U/L (ref 0–44)
AST: 24 U/L (ref 15–41)
Albumin: 3.6 g/dL (ref 3.5–5.0)
Alkaline Phosphatase: 82 U/L (ref 38–126)
Anion gap: 13 (ref 5–15)
BUN: 18 mg/dL (ref 8–23)
CO2: 27 mmol/L (ref 22–32)
Calcium: 9.7 mg/dL (ref 8.9–10.3)
Chloride: 95 mmol/L — ABNORMAL LOW (ref 98–111)
Creatinine, Ser: 0.9 mg/dL (ref 0.44–1.00)
GFR, Estimated: >60 mL/min (ref >60)
Glucose, Bld: 147 mg/dL — ABNORMAL HIGH (ref 70–99)
Potassium: 3.5 mmol/L (ref 3.5–5.1)
Sodium: 135 mmol/L (ref 135–145)
Total Bilirubin: 0.9 mg/dL (ref 0.3–1.2)
Total Protein: 7 g/dL (ref 6.5–8.1)

## 2022-01-01 LAB — MAGNESIUM: Magnesium: 2.1 mg/dL (ref 1.7–2.4)

## 2022-01-01 MED ORDER — LORAZEPAM 0.5 MG PO TABS
0.5000 mg | ORAL_TABLET | Freq: Three times a day (TID) | ORAL | 0 refills | Status: DC | PRN
  Filled 2022-01-01: qty 30, 10d supply, fill #0

## 2022-01-01 MED ORDER — SODIUM CHLORIDE 0.9 % IV SOLN
10.0000 mg | Freq: Once | INTRAVENOUS | Status: AC
  Administered 2022-01-01: 10 mg via INTRAVENOUS
  Filled 2022-01-01: qty 10

## 2022-01-01 MED ORDER — FLUCONAZOLE 40 MG/ML PO SUSR
200.0000 mg | Freq: Every day | ORAL | 0 refills | Status: DC
  Filled 2022-01-01: qty 35, 7d supply, fill #0

## 2022-01-01 MED ORDER — FAMOTIDINE IN NACL 20-0.9 MG/50ML-% IV SOLN
20.0000 mg | Freq: Once | INTRAVENOUS | Status: AC
  Administered 2022-01-01: 20 mg via INTRAVENOUS
  Filled 2022-01-01: qty 50

## 2022-01-01 MED ORDER — DEXAMETHASONE 4 MG PO TABS
ORAL_TABLET | ORAL | 0 refills | Status: DC
  Filled 2022-01-01: qty 30, 21d supply, fill #0

## 2022-01-01 MED ORDER — ONDANSETRON HCL 4 MG/2ML IJ SOLN
4.0000 mg | Freq: Once | INTRAMUSCULAR | Status: AC
  Administered 2022-01-01: 4 mg via INTRAVENOUS
  Filled 2022-01-01: qty 2

## 2022-01-01 MED ORDER — SODIUM CHLORIDE 0.9 % IV SOLN
INTRAVENOUS | Status: DC
  Filled 2022-01-01 (×2): qty 250

## 2022-01-01 MED ORDER — HEPARIN SOD (PORK) LOCK FLUSH 100 UNIT/ML IV SOLN
500.0000 [IU] | Freq: Once | INTRAVENOUS | Status: AC
  Administered 2022-01-01: 500 [IU]
  Filled 2022-01-01: qty 5

## 2022-01-01 MED ORDER — SODIUM CHLORIDE 0.9% FLUSH
10.0000 mL | Freq: Once | INTRAVENOUS | Status: AC
  Administered 2022-01-01: 10 mL via INTRAVENOUS
  Filled 2022-01-01: qty 10

## 2022-01-01 NOTE — Progress Notes (Signed)
Nutrition Assessment   Reason for Assessment:  Poor appetite, weight loss   ASSESSMENT:  61 year old female with stage IIb right breast cancer (09/2020).  Completed neoadjuvant therapy and underweight right lumpectomy followed by Beryle Flock, radiation and xeloda.  Recent disease progression involving bilateral lungs with mediastinal and hilar adenopathy and hypermetabolism in proximal right humerus.  Patient receiving radiation.  Spoke with patient via phone this am.  Patient having difficulty swallowing.  Says that yesterday was able to get down 3/4 ensure complete, 2 yogurts with berries blended, applesauce. Today has been able to get down 8 oz water, blended cottage cheese and mashed potatoes with gravy.     Medications: reviewed   Labs: glucose 128   Anthropometrics:   Height: 4'11" Weight: 132 lb 11/7 141 lb on 7/12 BMI: 26  6% weight loss in the last 4 months, concerning   Estimated Energy Needs  Kcals: 1500-1800 Protein: 75-90 g Fluid: 1500-1800 ml   NUTRITION DIAGNOSIS: Inadequate oral intake related to cancer (compression on esophagus) as evidenced by 6% weight loss in the last 4 months and inability to get food down    INTERVENTION:  Encouraged sipping on liquids/blended foods q 15 minutes Recommend 350 calories shakes or higher. Samples of [employer] 1.4 and Boost VHC left for nursing to give to patient this pm Encouraged blending, pureeing foods for ease of swallowing Discussed utilizing full fat products, butter, gravy, sauces, oils for ways to add moisture and calories Recipe booklet left for nursing to give to patient. Smoupies recipes provided as well Discussed option of utilizing protein powder Contact information provided    MONITORING, EVALUATION, GOAL: weight trends, intake   Next Visit: phone call, Wed, Nov 15  Luretha Eberly B. Zenia Resides, [place], [place] Registered Dietitian [phone]

## 2022-01-01 NOTE — Telephone Encounter (Addendum)
Patient called asking if there is an alternative way for her to take her steroids citing that she vomits every time she takes her steroids. She also reports that she is losing weight. Please advise
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
## 2022-01-01 NOTE — Telephone Encounter (Signed)
Prescription PA approved. Pharmacy - [facility]. Notified.

## 2022-01-02 ENCOUNTER — Other Ambulatory Visit: Payer: Self-pay | Admitting: Student

## 2022-01-02 ENCOUNTER — Telehealth: Payer: Self-pay | Admitting: *Deleted

## 2022-01-02 ENCOUNTER — Ambulatory Visit
Admission: RE | Admit: 2022-01-02 | Discharge: 2022-01-02 | Disposition: A | Discharge status: Home / Self Care | Payer: 59 | Source: Ambulatory Visit | Attending: Hospice and Palliative Medicine | Admitting: Hospice and Palliative Medicine

## 2022-01-02 ENCOUNTER — Other Ambulatory Visit: Payer: Self-pay

## 2022-01-02 ENCOUNTER — Inpatient Hospital Stay: Payer: 59

## 2022-01-02 ENCOUNTER — Ambulatory Visit
Admission: RE | Admit: 2022-01-02 | Discharge: 2022-01-02 | Disposition: A | Discharge status: Home / Self Care | Payer: 59 | Source: Ambulatory Visit | Attending: Student | Admitting: Student

## 2022-01-02 ENCOUNTER — Ambulatory Visit
Admission: RE | Admit: 2022-01-02 | Discharge: 2022-01-02 | Disposition: A | Discharge status: Home / Self Care | Payer: 59 | Source: Ambulatory Visit | Attending: Radiation Oncology | Admitting: Radiation Oncology

## 2022-01-02 ENCOUNTER — Other Ambulatory Visit: Payer: Self-pay | Admitting: *Deleted

## 2022-01-02 VITALS — BP 110/70 | HR 77 | Temp 97.6°F | Resp 18

## 2022-01-02 DIAGNOSIS — R1310 Dysphagia, unspecified: Secondary | ICD-10-CM

## 2022-01-02 DIAGNOSIS — Z170 Estrogen receptor positive status [ER+]: Secondary | ICD-10-CM | POA: Diagnosis not present

## 2022-01-02 DIAGNOSIS — C50411 Malignant neoplasm of upper-outer quadrant of right female breast: Secondary | ICD-10-CM

## 2022-01-02 DIAGNOSIS — C7801 Secondary malignant neoplasm of right lung: Secondary | ICD-10-CM | POA: Diagnosis not present

## 2022-01-02 DIAGNOSIS — Z510 Encounter for antineoplastic radiation therapy: Secondary | ICD-10-CM | POA: Diagnosis not present

## 2022-01-02 DIAGNOSIS — J90 Pleural effusion, not elsewhere classified: Secondary | ICD-10-CM | POA: Insufficient documentation

## 2022-01-02 DIAGNOSIS — Z9981 Dependence on supplemental oxygen: Secondary | ICD-10-CM | POA: Diagnosis not present

## 2022-01-02 DIAGNOSIS — Z9889 Other specified postprocedural states: Secondary | ICD-10-CM

## 2022-01-02 DIAGNOSIS — G893 Neoplasm related pain (acute) (chronic): Secondary | ICD-10-CM | POA: Diagnosis not present

## 2022-01-02 DIAGNOSIS — R112 Nausea with vomiting, unspecified: Secondary | ICD-10-CM

## 2022-01-02 DIAGNOSIS — R0602 Shortness of breath: Secondary | ICD-10-CM

## 2022-01-02 DIAGNOSIS — M79602 Pain in left arm: Secondary | ICD-10-CM | POA: Diagnosis not present

## 2022-01-02 DIAGNOSIS — C7802 Secondary malignant neoplasm of left lung: Secondary | ICD-10-CM | POA: Diagnosis not present

## 2022-01-02 DIAGNOSIS — J9601 Acute respiratory failure with hypoxia: Secondary | ICD-10-CM | POA: Diagnosis not present

## 2022-01-02 DIAGNOSIS — Z95828 Presence of other vascular implants and grafts: Secondary | ICD-10-CM

## 2022-01-02 DIAGNOSIS — Z171 Estrogen receptor negative status [ER-]: Secondary | ICD-10-CM | POA: Diagnosis not present

## 2022-01-02 DIAGNOSIS — C7951 Secondary malignant neoplasm of bone: Secondary | ICD-10-CM | POA: Diagnosis not present

## 2022-01-02 LAB — RAD ONC ARIA SESSION SUMMARY
Course Elapsed Days: 3
Plan Fractions Treated to Date: 4
Plan Prescribed Dose Per Fraction: 2 Gy
Plan Total Fractions Prescribed: 20
Plan Total Prescribed Dose: 40 Gy
Reference Point Dosage Given to Date: 8 Gy
Reference Point Session Dosage Given: 2 Gy
Session Number: 4

## 2022-01-02 MED ORDER — HEPARIN SOD (PORK) LOCK FLUSH 100 UNIT/ML IV SOLN
500.0000 [IU] | Freq: Once | INTRAVENOUS | Status: AC
  Administered 2022-01-02: 500 [IU]
  Filled 2022-01-02: qty 5

## 2022-01-02 MED ORDER — LIDOCAINE HCL (PF) 1 % IJ SOLN
8.0000 mL | Freq: Once | INTRAMUSCULAR | Status: AC
  Administered 2022-01-02: 8 mL via INTRADERMAL
  Filled 2022-01-02: qty 8

## 2022-01-02 MED ORDER — SODIUM CHLORIDE 0.9 % IV SOLN
INTRAVENOUS | Status: DC
  Filled 2022-01-02 (×2): qty 250

## 2022-01-02 MED ORDER — FAMOTIDINE IN NACL 20-0.9 MG/50ML-% IV SOLN
20.0000 mg | Freq: Once | INTRAVENOUS | Status: AC
  Administered 2022-01-02: 20 mg via INTRAVENOUS
  Filled 2022-01-02: qty 50

## 2022-01-02 MED ORDER — ONDANSETRON HCL 4 MG/2ML IJ SOLN
4.0000 mg | Freq: Once | INTRAMUSCULAR | Status: AC
  Administered 2022-01-02: 4 mg via INTRAVENOUS
  Filled 2022-01-02: qty 2

## 2022-01-02 NOTE — Procedures (Addendum)
PROCEDURE SUMMARY:  Successful image-guided left thoracentesis. Yielded 450cc of yellow fluid. Pt tolerated procedure well. No immediate complications. EBL = trace   Specimen was sent for labs. CXR ordered.  Please see imaging section of Epic for full dictation.  Lura Em PA-C 01/02/2022 12:16 PM

## 2022-01-02 NOTE — Addendum Note (Signed)
Addended by: [name] on: 01/02/2022 09:59 AM   Modules accepted: Orders

## 2022-01-02 NOTE — Telephone Encounter (Signed)
Received a form from patient from [facility] for accelerated Benefit. Form completed and sent for physician signature.

## 2022-01-02 NOTE — Telephone Encounter (Signed)
Josh, NP arranged for stat thoracentesis today. I contacted pt with apts. She is on her way and will come to the cancer center after her procedure for the radiation/ fluids.

## 2022-01-02 NOTE — Telephone Encounter (Signed)
Form signed and original copy put at front desk for patient to pick up today per her request

## 2022-01-04 ENCOUNTER — Other Ambulatory Visit: Payer: Self-pay

## 2022-01-05 ENCOUNTER — Telehealth: Payer: Self-pay | Admitting: *Deleted

## 2022-01-05 ENCOUNTER — Inpatient Hospital Stay: Payer: 59

## 2022-01-05 ENCOUNTER — Ambulatory Visit
Admission: RE | Admit: 2022-01-05 | Discharge: 2022-01-05 | Disposition: A | Discharge status: Home / Self Care | Payer: 59 | Source: Ambulatory Visit | Attending: Radiation Oncology | Admitting: Radiation Oncology

## 2022-01-05 ENCOUNTER — Other Ambulatory Visit (HOSPITAL_COMMUNITY): Payer: Self-pay

## 2022-01-05 ENCOUNTER — Other Ambulatory Visit: Payer: Self-pay

## 2022-01-05 VITALS — BP 112/77 | HR 99 | Temp 97.4°F | Resp 20

## 2022-01-05 DIAGNOSIS — C7802 Secondary malignant neoplasm of left lung: Secondary | ICD-10-CM | POA: Diagnosis not present

## 2022-01-05 DIAGNOSIS — Z171 Estrogen receptor negative status [ER-]: Secondary | ICD-10-CM | POA: Diagnosis not present

## 2022-01-05 DIAGNOSIS — Z510 Encounter for antineoplastic radiation therapy: Secondary | ICD-10-CM | POA: Diagnosis not present

## 2022-01-05 DIAGNOSIS — M79602 Pain in left arm: Secondary | ICD-10-CM | POA: Diagnosis not present

## 2022-01-05 DIAGNOSIS — Z95828 Presence of other vascular implants and grafts: Secondary | ICD-10-CM

## 2022-01-05 DIAGNOSIS — R1310 Dysphagia, unspecified: Secondary | ICD-10-CM

## 2022-01-05 DIAGNOSIS — C7801 Secondary malignant neoplasm of right lung: Secondary | ICD-10-CM | POA: Diagnosis not present

## 2022-01-05 DIAGNOSIS — C7951 Secondary malignant neoplasm of bone: Secondary | ICD-10-CM | POA: Diagnosis not present

## 2022-01-05 DIAGNOSIS — J9601 Acute respiratory failure with hypoxia: Secondary | ICD-10-CM | POA: Diagnosis not present

## 2022-01-05 DIAGNOSIS — C50411 Malignant neoplasm of upper-outer quadrant of right female breast: Secondary | ICD-10-CM | POA: Diagnosis not present

## 2022-01-05 DIAGNOSIS — R112 Nausea with vomiting, unspecified: Secondary | ICD-10-CM

## 2022-01-05 DIAGNOSIS — Z170 Estrogen receptor positive status [ER+]: Secondary | ICD-10-CM

## 2022-01-05 DIAGNOSIS — Z9981 Dependence on supplemental oxygen: Secondary | ICD-10-CM | POA: Diagnosis not present

## 2022-01-05 DIAGNOSIS — G893 Neoplasm related pain (acute) (chronic): Secondary | ICD-10-CM | POA: Diagnosis not present

## 2022-01-05 LAB — CBC WITH DIFFERENTIAL/PLATELET
Abs Immature Granulocytes: 0.15 10*3/uL — ABNORMAL HIGH (ref 0.00–0.07)
Basophils Absolute: 0 10*3/uL (ref 0.0–0.1)
Basophils Relative: 0 %
Eosinophils Absolute: 0 10*3/uL (ref 0.0–0.5)
Eosinophils Relative: 0 %
HCT: 39.3 % (ref 36.0–46.0)
Hemoglobin: 13.6 g/dL (ref 12.0–15.0)
Immature Granulocytes: 1 %
Lymphocytes Relative: 1 %
Lymphs Abs: 0.2 10*3/uL — ABNORMAL LOW (ref 0.7–4.0)
MCH: 33.8 pg (ref 26.0–34.0)
MCHC: 34.6 g/dL (ref 30.0–36.0)
MCV: 97.8 fL (ref 80.0–100.0)
Monocytes Absolute: 0.4 10*3/uL (ref 0.1–1.0)
Monocytes Relative: 2 %
Neutro Abs: 14.9 10*3/uL — ABNORMAL HIGH (ref 1.7–7.7)
Neutrophils Relative %: 96 %
Platelets: 231 10*3/uL (ref 150–400)
RBC: 4.02 MIL/uL (ref 3.87–5.11)
RDW: 17.2 % — ABNORMAL HIGH (ref 11.5–15.5)
WBC: 15.6 10*3/uL — ABNORMAL HIGH (ref 4.0–10.5)
nRBC: 0 % (ref 0.0–0.2)

## 2022-01-05 LAB — BASIC METABOLIC PANEL
Anion gap: 14 (ref 5–15)
BUN: 18 mg/dL (ref 8–23)
CO2: 26 mmol/L (ref 22–32)
Calcium: 9.4 mg/dL (ref 8.9–10.3)
Chloride: 94 mmol/L — ABNORMAL LOW (ref 98–111)
Creatinine, Ser: 0.82 mg/dL (ref 0.44–1.00)
GFR, Estimated: >60 mL/min (ref >60)
Glucose, Bld: 167 mg/dL — ABNORMAL HIGH (ref 70–99)
Potassium: 3.5 mmol/L (ref 3.5–5.1)
Sodium: 134 mmol/L — ABNORMAL LOW (ref 135–145)

## 2022-01-05 LAB — RAD ONC ARIA SESSION SUMMARY
Course Elapsed Days: 6
Plan Fractions Treated to Date: 5
Plan Prescribed Dose Per Fraction: 2 Gy
Plan Total Fractions Prescribed: 20
Plan Total Prescribed Dose: 40 Gy
Reference Point Dosage Given to Date: 10 Gy
Reference Point Session Dosage Given: 2 Gy
Session Number: 5

## 2022-01-05 MED ORDER — FAMOTIDINE IN NACL 20-0.9 MG/50ML-% IV SOLN
20.0000 mg | Freq: Once | INTRAVENOUS | Status: AC
  Administered 2022-01-05: 20 mg via INTRAVENOUS
  Filled 2022-01-05: qty 50

## 2022-01-05 MED ORDER — SODIUM CHLORIDE 0.9 % IV SOLN
INTRAVENOUS | Status: DC
  Filled 2022-01-05 (×2): qty 250

## 2022-01-05 MED ORDER — ONDANSETRON HCL 4 MG/2ML IJ SOLN
4.0000 mg | Freq: Once | INTRAMUSCULAR | Status: AC
  Administered 2022-01-05: 4 mg via INTRAVENOUS
  Filled 2022-01-05: qty 2

## 2022-01-05 NOTE — Telephone Encounter (Signed)
Patient called this company asking for a portable O2 Concentrator and they need documentation that patient is on O2 and needs it as well as On or other necessary documentation Fax to [phone]

## 2022-01-05 NOTE — Telephone Encounter (Signed)
I called radiology to push the films from her u/s of lung to get they are pushing the images to [facility]

## 2022-01-05 NOTE — Telephone Encounter (Signed)
Can you please documentation in since you saw her last?

## 2022-01-06 ENCOUNTER — Encounter: Payer: Self-pay | Admitting: Oncology

## 2022-01-06 ENCOUNTER — Inpatient Hospital Stay: Payer: 59

## 2022-01-06 ENCOUNTER — Other Ambulatory Visit: Payer: Self-pay

## 2022-01-06 ENCOUNTER — Ambulatory Visit
Admission: RE | Admit: 2022-01-06 | Discharge: 2022-01-06 | Disposition: A | Discharge status: Home / Self Care | Payer: 59 | Source: Ambulatory Visit | Attending: Radiation Oncology | Admitting: Radiation Oncology

## 2022-01-06 ENCOUNTER — Inpatient Hospital Stay: Payer: 59 | Admitting: Hospice and Palliative Medicine

## 2022-01-06 ENCOUNTER — Ambulatory Visit
Admission: RE | Admit: 2022-01-06 | Discharge: 2022-01-06 | Disposition: A | Discharge status: Home / Self Care | Payer: 59 | Source: Ambulatory Visit | Attending: Nurse Practitioner | Admitting: Nurse Practitioner

## 2022-01-06 VITALS — BP 130/80 | HR 108 | Temp 97.7°F

## 2022-01-06 DIAGNOSIS — Z510 Encounter for antineoplastic radiation therapy: Secondary | ICD-10-CM | POA: Diagnosis not present

## 2022-01-06 DIAGNOSIS — R112 Nausea with vomiting, unspecified: Secondary | ICD-10-CM

## 2022-01-06 DIAGNOSIS — R1310 Dysphagia, unspecified: Secondary | ICD-10-CM

## 2022-01-06 DIAGNOSIS — C50411 Malignant neoplasm of upper-outer quadrant of right female breast: Secondary | ICD-10-CM | POA: Diagnosis not present

## 2022-01-06 DIAGNOSIS — C50919 Malignant neoplasm of unspecified site of unspecified female breast: Secondary | ICD-10-CM | POA: Diagnosis not present

## 2022-01-06 DIAGNOSIS — Z171 Estrogen receptor negative status [ER-]: Secondary | ICD-10-CM | POA: Diagnosis not present

## 2022-01-06 DIAGNOSIS — C7951 Secondary malignant neoplasm of bone: Secondary | ICD-10-CM | POA: Diagnosis not present

## 2022-01-06 DIAGNOSIS — G893 Neoplasm related pain (acute) (chronic): Secondary | ICD-10-CM | POA: Diagnosis not present

## 2022-01-06 DIAGNOSIS — Z170 Estrogen receptor positive status [ER+]: Secondary | ICD-10-CM | POA: Diagnosis not present

## 2022-01-06 DIAGNOSIS — R220 Localized swelling, mass and lump, head: Secondary | ICD-10-CM | POA: Diagnosis not present

## 2022-01-06 DIAGNOSIS — M79602 Pain in left arm: Secondary | ICD-10-CM | POA: Diagnosis not present

## 2022-01-06 DIAGNOSIS — C7801 Secondary malignant neoplasm of right lung: Secondary | ICD-10-CM | POA: Diagnosis not present

## 2022-01-06 DIAGNOSIS — Z9981 Dependence on supplemental oxygen: Secondary | ICD-10-CM | POA: Diagnosis not present

## 2022-01-06 DIAGNOSIS — C7802 Secondary malignant neoplasm of left lung: Secondary | ICD-10-CM | POA: Diagnosis not present

## 2022-01-06 DIAGNOSIS — J9601 Acute respiratory failure with hypoxia: Secondary | ICD-10-CM | POA: Diagnosis not present

## 2022-01-06 DIAGNOSIS — I6782 Cerebral ischemia: Secondary | ICD-10-CM | POA: Diagnosis not present

## 2022-01-06 DIAGNOSIS — Z95828 Presence of other vascular implants and grafts: Secondary | ICD-10-CM

## 2022-01-06 LAB — BASIC METABOLIC PANEL
Anion gap: 11 (ref 5–15)
BUN: 17 mg/dL (ref 8–23)
CO2: 26 mmol/L (ref 22–32)
Calcium: 9.4 mg/dL (ref 8.9–10.3)
Chloride: 99 mmol/L (ref 98–111)
Creatinine, Ser: 0.86 mg/dL (ref 0.44–1.00)
GFR, Estimated: >60 mL/min (ref >60)
Glucose, Bld: 137 mg/dL — ABNORMAL HIGH (ref 70–99)
Potassium: 3.5 mmol/L (ref 3.5–5.1)
Sodium: 136 mmol/L (ref 135–145)

## 2022-01-06 LAB — RAD ONC ARIA SESSION SUMMARY
Course Elapsed Days: 7
Plan Fractions Treated to Date: 6
Plan Prescribed Dose Per Fraction: 2 Gy
Plan Total Fractions Prescribed: 20
Plan Total Prescribed Dose: 40 Gy
Reference Point Dosage Given to Date: 12 Gy
Reference Point Session Dosage Given: 2 Gy
Session Number: 6

## 2022-01-06 LAB — CYTOLOGY - NON PAP

## 2022-01-06 MED ORDER — ONDANSETRON HCL 4 MG/2ML IJ SOLN
4.0000 mg | Freq: Once | INTRAMUSCULAR | Status: AC
  Administered 2022-01-06: 4 mg via INTRAVENOUS
  Filled 2022-01-06: qty 2

## 2022-01-06 MED ORDER — SODIUM CHLORIDE 0.9% FLUSH
10.0000 mL | Freq: Once | INTRAVENOUS | Status: AC
  Administered 2022-01-06: 10 mL via INTRAVENOUS
  Filled 2022-01-06: qty 10

## 2022-01-06 MED ORDER — GADOBUTROL 1 MMOL/ML IV SOLN
6.0000 mL | Freq: Once | INTRAVENOUS | Status: AC | PRN
  Administered 2022-01-06: 6 mL via INTRAVENOUS

## 2022-01-06 MED ORDER — SODIUM CHLORIDE 0.9 % IV SOLN
INTRAVENOUS | Status: DC
  Filled 2022-01-06 (×2): qty 250

## 2022-01-06 MED ORDER — FAMOTIDINE IN NACL 20-0.9 MG/50ML-% IV SOLN
20.0000 mg | Freq: Once | INTRAVENOUS | Status: AC
  Administered 2022-01-06: 20 mg via INTRAVENOUS
  Filled 2022-01-06: qty 50

## 2022-01-06 MED ORDER — HEPARIN SOD (PORK) LOCK FLUSH 100 UNIT/ML IV SOLN
500.0000 [IU] | Freq: Once | INTRAVENOUS | Status: AC
  Administered 2022-01-06: 500 [IU] via INTRAVENOUS
  Filled 2022-01-06: qty 5

## 2022-01-06 NOTE — Telephone Encounter (Signed)
I received another call from [place] wanting follow up of her request  yesterday

## 2022-01-07 ENCOUNTER — Inpatient Hospital Stay: Payer: 59

## 2022-01-07 ENCOUNTER — Inpatient Hospital Stay: Payer: 59 | Admitting: Occupational Therapy

## 2022-01-07 ENCOUNTER — Other Ambulatory Visit: Payer: Self-pay

## 2022-01-07 ENCOUNTER — Ambulatory Visit: Payer: 59

## 2022-01-07 ENCOUNTER — Encounter: Payer: Self-pay | Admitting: Oncology

## 2022-01-07 ENCOUNTER — Ambulatory Visit
Admission: RE | Admit: 2022-01-07 | Discharge: 2022-01-07 | Disposition: A | Discharge status: Home / Self Care | Payer: 59 | Source: Ambulatory Visit | Attending: Radiation Oncology | Admitting: Radiation Oncology

## 2022-01-07 ENCOUNTER — Inpatient Hospital Stay (HOSPITAL_BASED_OUTPATIENT_CLINIC_OR_DEPARTMENT_OTHER): Payer: 59 | Admitting: Oncology

## 2022-01-07 VITALS — BP 115/75 | HR 92 | Temp 97.7°F | Resp 16 | Wt 127.4 lb

## 2022-01-07 DIAGNOSIS — Z171 Estrogen receptor negative status [ER-]: Secondary | ICD-10-CM | POA: Diagnosis not present

## 2022-01-07 DIAGNOSIS — Z7189 Other specified counseling: Secondary | ICD-10-CM

## 2022-01-07 DIAGNOSIS — C50411 Malignant neoplasm of upper-outer quadrant of right female breast: Secondary | ICD-10-CM | POA: Diagnosis not present

## 2022-01-07 DIAGNOSIS — G893 Neoplasm related pain (acute) (chronic): Secondary | ICD-10-CM | POA: Diagnosis not present

## 2022-01-07 DIAGNOSIS — Z170 Estrogen receptor positive status [ER+]: Secondary | ICD-10-CM | POA: Diagnosis not present

## 2022-01-07 DIAGNOSIS — J9601 Acute respiratory failure with hypoxia: Secondary | ICD-10-CM

## 2022-01-07 DIAGNOSIS — M6281 Muscle weakness (generalized): Secondary | ICD-10-CM

## 2022-01-07 DIAGNOSIS — C7801 Secondary malignant neoplasm of right lung: Secondary | ICD-10-CM | POA: Diagnosis not present

## 2022-01-07 DIAGNOSIS — Z510 Encounter for antineoplastic radiation therapy: Secondary | ICD-10-CM | POA: Diagnosis not present

## 2022-01-07 DIAGNOSIS — C7802 Secondary malignant neoplasm of left lung: Secondary | ICD-10-CM | POA: Diagnosis not present

## 2022-01-07 DIAGNOSIS — Z9981 Dependence on supplemental oxygen: Secondary | ICD-10-CM | POA: Diagnosis not present

## 2022-01-07 DIAGNOSIS — Z9189 Other specified personal risk factors, not elsewhere classified: Secondary | ICD-10-CM

## 2022-01-07 DIAGNOSIS — M25611 Stiffness of right shoulder, not elsewhere classified: Secondary | ICD-10-CM

## 2022-01-07 DIAGNOSIS — M79602 Pain in left arm: Secondary | ICD-10-CM | POA: Diagnosis not present

## 2022-01-07 DIAGNOSIS — C7951 Secondary malignant neoplasm of bone: Secondary | ICD-10-CM | POA: Diagnosis not present

## 2022-01-07 DIAGNOSIS — L905 Scar conditions and fibrosis of skin: Secondary | ICD-10-CM

## 2022-01-07 DIAGNOSIS — M25511 Pain in right shoulder: Secondary | ICD-10-CM

## 2022-01-07 LAB — CBC WITH DIFFERENTIAL/PLATELET
Abs Immature Granulocytes: 0.1 10*3/uL — ABNORMAL HIGH (ref 0.00–0.07)
Basophils Absolute: 0 10*3/uL (ref 0.0–0.1)
Basophils Relative: 0 %
Eosinophils Absolute: 0 10*3/uL (ref 0.0–0.5)
Eosinophils Relative: 0 %
HCT: 39.2 % (ref 36.0–46.0)
Hemoglobin: 13.6 g/dL (ref 12.0–15.0)
Immature Granulocytes: 1 %
Lymphocytes Relative: 1 %
Lymphs Abs: 0.1 10*3/uL — ABNORMAL LOW (ref 0.7–4.0)
MCH: 34 pg (ref 26.0–34.0)
MCHC: 34.7 g/dL (ref 30.0–36.0)
MCV: 98 fL (ref 80.0–100.0)
Monocytes Absolute: 0.4 10*3/uL (ref 0.1–1.0)
Monocytes Relative: 2 %
Neutro Abs: 16.1 10*3/uL — ABNORMAL HIGH (ref 1.7–7.7)
Neutrophils Relative %: 96 %
Platelets: 203 10*3/uL (ref 150–400)
RBC: 4 MIL/uL (ref 3.87–5.11)
RDW: 17.1 % — ABNORMAL HIGH (ref 11.5–15.5)
WBC: 16.7 10*3/uL — ABNORMAL HIGH (ref 4.0–10.5)
nRBC: 0 % (ref 0.0–0.2)

## 2022-01-07 LAB — COMPREHENSIVE METABOLIC PANEL
ALT: 19 U/L (ref 0–44)
AST: 23 U/L (ref 15–41)
Albumin: 3.4 g/dL — ABNORMAL LOW (ref 3.5–5.0)
Alkaline Phosphatase: 97 U/L (ref 38–126)
Anion gap: 11 (ref 5–15)
BUN: 19 mg/dL (ref 8–23)
CO2: 24 mmol/L (ref 22–32)
Calcium: 9.2 mg/dL (ref 8.9–10.3)
Chloride: 97 mmol/L — ABNORMAL LOW (ref 98–111)
Creatinine, Ser: 0.76 mg/dL (ref 0.44–1.00)
GFR, Estimated: >60 mL/min (ref >60)
Glucose, Bld: 165 mg/dL — ABNORMAL HIGH (ref 70–99)
Potassium: 3.4 mmol/L — ABNORMAL LOW (ref 3.5–5.1)
Sodium: 132 mmol/L — ABNORMAL LOW (ref 135–145)
Total Bilirubin: 0.5 mg/dL (ref 0.3–1.2)
Total Protein: 6.5 g/dL (ref 6.5–8.1)

## 2022-01-07 LAB — RAD ONC ARIA SESSION SUMMARY
Course Elapsed Days: 8
Plan Fractions Treated to Date: 7
Plan Prescribed Dose Per Fraction: 2 Gy
Plan Total Fractions Prescribed: 20
Plan Total Prescribed Dose: 40 Gy
Reference Point Dosage Given to Date: 14 Gy
Reference Point Session Dosage Given: 2 Gy
Session Number: 7

## 2022-01-07 NOTE — Progress Notes (Signed)
Pt states that since last week her symptoms have improved pain level this afternoon is 2/10.

## 2022-01-07 NOTE — Progress Notes (Signed)
Nutrition Follow-up:  Patient with stage IIb right breast cancer (09/2020).  Completed neoadjuvant therapy and underweight right lumpectomy followed by Beryle Flock, radiation and xeloda.  Recent disease progression involving bilateral lungs with mediastinal and hilar adenopathy and hypermetabolism in proximal right humerus.  Patient receiving radiation.   Spoke with patient via phone for nutrition follow-up.  Patient reports that she is able to get down more solid foods at this time.  Eating mashed potatoes with half and half, sausage gravy, vegetable lasagna with cheese sauce, chicken casserole.  Has not tried oral nutrition supplement shake samples at this time.  Received recipes and additional nutrition information    Medications: reviewed  Labs: reviewed  Anthropometrics:   Weight 127 lb 6.4 oz on 11/15 132 lb on 11/7 141 lb on 7/12   NUTRITION DIAGNOSIS: Inadequate oral intake ongoing but better than last week   INTERVENTION:  Continue to encourage small nibbles/mini meals using high calorie, high protein techniques Continue high calorie oral nutrition supplement shakes Modify textures of foods for ease of swallowing    MONITORING, EVALUATION, GOAL: weight trends, intake   NEXT VISIT: Thursday, Dec 7 phone call  Matti Minney B. Zenia Resides, [place], [place] Registered Dietitian [phone]
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
## 2022-01-08 ENCOUNTER — Other Ambulatory Visit: Payer: Self-pay

## 2022-01-08 ENCOUNTER — Ambulatory Visit
Admission: RE | Admit: 2022-01-08 | Discharge: 2022-01-08 | Disposition: A | Discharge status: Home / Self Care | Payer: 59 | Source: Ambulatory Visit | Attending: Radiation Oncology | Admitting: Radiation Oncology

## 2022-01-08 DIAGNOSIS — Z9981 Dependence on supplemental oxygen: Secondary | ICD-10-CM | POA: Diagnosis not present

## 2022-01-08 DIAGNOSIS — C7802 Secondary malignant neoplasm of left lung: Secondary | ICD-10-CM | POA: Diagnosis not present

## 2022-01-08 DIAGNOSIS — C50411 Malignant neoplasm of upper-outer quadrant of right female breast: Secondary | ICD-10-CM | POA: Diagnosis not present

## 2022-01-08 DIAGNOSIS — Z510 Encounter for antineoplastic radiation therapy: Secondary | ICD-10-CM | POA: Diagnosis not present

## 2022-01-08 DIAGNOSIS — J9601 Acute respiratory failure with hypoxia: Secondary | ICD-10-CM | POA: Diagnosis not present

## 2022-01-08 DIAGNOSIS — Z171 Estrogen receptor negative status [ER-]: Secondary | ICD-10-CM | POA: Diagnosis not present

## 2022-01-08 DIAGNOSIS — C7951 Secondary malignant neoplasm of bone: Secondary | ICD-10-CM | POA: Diagnosis not present

## 2022-01-08 DIAGNOSIS — C7801 Secondary malignant neoplasm of right lung: Secondary | ICD-10-CM | POA: Diagnosis not present

## 2022-01-08 DIAGNOSIS — G893 Neoplasm related pain (acute) (chronic): Secondary | ICD-10-CM | POA: Diagnosis not present

## 2022-01-08 DIAGNOSIS — M79602 Pain in left arm: Secondary | ICD-10-CM | POA: Diagnosis not present

## 2022-01-08 LAB — RAD ONC ARIA SESSION SUMMARY
Course Elapsed Days: 9
Plan Fractions Treated to Date: 8
Plan Prescribed Dose Per Fraction: 2 Gy
Plan Total Fractions Prescribed: 20
Plan Total Prescribed Dose: 40 Gy
Reference Point Dosage Given to Date: 16 Gy
Reference Point Session Dosage Given: 2 Gy
Session Number: 8

## 2022-01-09 ENCOUNTER — Other Ambulatory Visit: Payer: Self-pay

## 2022-01-09 ENCOUNTER — Telehealth: Payer: Self-pay | Admitting: *Deleted

## 2022-01-09 ENCOUNTER — Ambulatory Visit
Admission: RE | Admit: 2022-01-09 | Discharge: 2022-01-09 | Disposition: A | Discharge status: Home / Self Care | Payer: 59 | Source: Ambulatory Visit | Attending: Radiation Oncology | Admitting: Radiation Oncology

## 2022-01-09 DIAGNOSIS — C7801 Secondary malignant neoplasm of right lung: Secondary | ICD-10-CM | POA: Diagnosis not present

## 2022-01-09 DIAGNOSIS — Z171 Estrogen receptor negative status [ER-]: Secondary | ICD-10-CM | POA: Diagnosis not present

## 2022-01-09 DIAGNOSIS — C7802 Secondary malignant neoplasm of left lung: Secondary | ICD-10-CM | POA: Diagnosis not present

## 2022-01-09 DIAGNOSIS — Z9981 Dependence on supplemental oxygen: Secondary | ICD-10-CM | POA: Diagnosis not present

## 2022-01-09 DIAGNOSIS — J9601 Acute respiratory failure with hypoxia: Secondary | ICD-10-CM | POA: Diagnosis not present

## 2022-01-09 DIAGNOSIS — Z510 Encounter for antineoplastic radiation therapy: Secondary | ICD-10-CM | POA: Diagnosis not present

## 2022-01-09 DIAGNOSIS — G893 Neoplasm related pain (acute) (chronic): Secondary | ICD-10-CM | POA: Diagnosis not present

## 2022-01-09 DIAGNOSIS — C50411 Malignant neoplasm of upper-outer quadrant of right female breast: Secondary | ICD-10-CM | POA: Diagnosis not present

## 2022-01-09 DIAGNOSIS — M79602 Pain in left arm: Secondary | ICD-10-CM | POA: Diagnosis not present

## 2022-01-09 DIAGNOSIS — C7951 Secondary malignant neoplasm of bone: Secondary | ICD-10-CM | POA: Diagnosis not present

## 2022-01-09 LAB — RAD ONC ARIA SESSION SUMMARY
Course Elapsed Days: 10
Plan Fractions Treated to Date: 9
Plan Prescribed Dose Per Fraction: 2 Gy
Plan Total Fractions Prescribed: 20
Plan Total Prescribed Dose: 40 Gy
Reference Point Dosage Given to Date: 18 Gy
Reference Point Session Dosage Given: 2 Gy
Session Number: 9

## 2022-01-09 MED ORDER — SUCRALFATE 1 GM/10ML PO SUSP
1.0000 g | Freq: Three times a day (TID) | ORAL | 0 refills | Status: AC
  Filled 2022-01-09: qty 420, 11d supply, fill #0

## 2022-01-09 MED ORDER — LIDOCAINE VISCOUS HCL 2 % MT SOLN
15.0000 mL | OROMUCOSAL | 0 refills | Status: DC | PRN
  Filled 2022-01-09: qty 300, 4d supply, fill #0

## 2022-01-09 NOTE — Telephone Encounter (Signed)
Please call her. She was ok when I saw her this week. We can add fentanyl patch and ask her to increase morphine dose

## 2022-01-09 NOTE — Telephone Encounter (Signed)
I spoke with patient by phone.  She completed fluconazole yesterday.  She reports no visible thrush in her mouth.  She says that her swallowing has improved.  She does endorse worse upper esophageal/throat pain over the past 1 to 2 days.  Pain was intense last night causing difficulty with sleep.  She says that she has only taken two doses of the morphine in the past 24 hours.  It did help but was short-lived.  Patient says in the past that she has been sensitive to medications but is so far tolerating the morphine okay.  We discussed liberalizing the frequency with which she takes to morphine as needed.  We also discussed the option of starting a long-acting opioid if needed but patient seemed agreeable to first trying the morphine more frequently.  We will also add sucralfate and viscous lidocaine.

## 2022-01-09 NOTE — Telephone Encounter (Signed)
Patient called reporting that she is having throat pain not controlled by the MS and is asking for something that will work because she does "not want to go through the weekend hurting like this"  Please advise. She is scheduled for radiation therapy at 1230 today

## 2022-01-12 ENCOUNTER — Ambulatory Visit
Admission: RE | Admit: 2022-01-12 | Discharge: 2022-01-12 | Disposition: A | Discharge status: Home / Self Care | Payer: 59 | Source: Ambulatory Visit | Attending: Radiation Oncology | Admitting: Radiation Oncology

## 2022-01-12 ENCOUNTER — Other Ambulatory Visit: Payer: Self-pay | Admitting: *Deleted

## 2022-01-12 ENCOUNTER — Ambulatory Visit
Admission: RE | Admit: 2022-01-12 | Discharge: 2022-01-12 | Disposition: A | Discharge status: Home / Self Care | Payer: 59 | Attending: Hospice and Palliative Medicine | Admitting: Hospice and Palliative Medicine

## 2022-01-12 ENCOUNTER — Other Ambulatory Visit: Payer: Self-pay

## 2022-01-12 ENCOUNTER — Ambulatory Visit
Admission: RE | Admit: 2022-01-12 | Discharge: 2022-01-12 | Disposition: A | Discharge status: Home / Self Care | Payer: 59 | Source: Ambulatory Visit | Attending: Hospice and Palliative Medicine | Admitting: Hospice and Palliative Medicine

## 2022-01-12 DIAGNOSIS — C7951 Secondary malignant neoplasm of bone: Secondary | ICD-10-CM | POA: Diagnosis not present

## 2022-01-12 DIAGNOSIS — Z171 Estrogen receptor negative status [ER-]: Secondary | ICD-10-CM | POA: Diagnosis not present

## 2022-01-12 DIAGNOSIS — J90 Pleural effusion, not elsewhere classified: Secondary | ICD-10-CM | POA: Insufficient documentation

## 2022-01-12 DIAGNOSIS — R918 Other nonspecific abnormal finding of lung field: Secondary | ICD-10-CM | POA: Diagnosis not present

## 2022-01-12 DIAGNOSIS — G893 Neoplasm related pain (acute) (chronic): Secondary | ICD-10-CM | POA: Diagnosis not present

## 2022-01-12 DIAGNOSIS — C7802 Secondary malignant neoplasm of left lung: Secondary | ICD-10-CM | POA: Diagnosis not present

## 2022-01-12 DIAGNOSIS — R0602 Shortness of breath: Secondary | ICD-10-CM | POA: Insufficient documentation

## 2022-01-12 DIAGNOSIS — Z510 Encounter for antineoplastic radiation therapy: Secondary | ICD-10-CM | POA: Diagnosis not present

## 2022-01-12 DIAGNOSIS — C50411 Malignant neoplasm of upper-outer quadrant of right female breast: Secondary | ICD-10-CM | POA: Diagnosis not present

## 2022-01-12 DIAGNOSIS — J9601 Acute respiratory failure with hypoxia: Secondary | ICD-10-CM | POA: Diagnosis not present

## 2022-01-12 DIAGNOSIS — Z9981 Dependence on supplemental oxygen: Secondary | ICD-10-CM | POA: Diagnosis not present

## 2022-01-12 DIAGNOSIS — R911 Solitary pulmonary nodule: Secondary | ICD-10-CM | POA: Diagnosis not present

## 2022-01-12 DIAGNOSIS — C7801 Secondary malignant neoplasm of right lung: Secondary | ICD-10-CM | POA: Diagnosis not present

## 2022-01-12 DIAGNOSIS — M79602 Pain in left arm: Secondary | ICD-10-CM | POA: Diagnosis not present

## 2022-01-12 LAB — RAD ONC ARIA SESSION SUMMARY
Course Elapsed Days: 13
Plan Fractions Treated to Date: 10
Plan Prescribed Dose Per Fraction: 2 Gy
Plan Total Fractions Prescribed: 20
Plan Total Prescribed Dose: 40 Gy
Reference Point Dosage Given to Date: 20 Gy
Reference Point Session Dosage Given: 2 Gy
Session Number: 10

## 2022-01-13 ENCOUNTER — Encounter: Payer: Self-pay | Admitting: Hospice and Palliative Medicine

## 2022-01-13 ENCOUNTER — Other Ambulatory Visit: Payer: Self-pay | Admitting: Hospice and Palliative Medicine

## 2022-01-13 ENCOUNTER — Other Ambulatory Visit: Payer: Self-pay

## 2022-01-13 ENCOUNTER — Ambulatory Visit
Admission: RE | Admit: 2022-01-13 | Discharge: 2022-01-13 | Disposition: A | Discharge status: Home / Self Care | Payer: 59 | Source: Ambulatory Visit | Attending: Radiation Oncology | Admitting: Radiation Oncology

## 2022-01-13 DIAGNOSIS — C7801 Secondary malignant neoplasm of right lung: Secondary | ICD-10-CM | POA: Diagnosis not present

## 2022-01-13 DIAGNOSIS — J9601 Acute respiratory failure with hypoxia: Secondary | ICD-10-CM | POA: Diagnosis not present

## 2022-01-13 DIAGNOSIS — G893 Neoplasm related pain (acute) (chronic): Secondary | ICD-10-CM | POA: Diagnosis not present

## 2022-01-13 DIAGNOSIS — M79602 Pain in left arm: Secondary | ICD-10-CM | POA: Diagnosis not present

## 2022-01-13 DIAGNOSIS — C7951 Secondary malignant neoplasm of bone: Secondary | ICD-10-CM | POA: Diagnosis not present

## 2022-01-13 DIAGNOSIS — C50411 Malignant neoplasm of upper-outer quadrant of right female breast: Secondary | ICD-10-CM | POA: Diagnosis not present

## 2022-01-13 DIAGNOSIS — Z9981 Dependence on supplemental oxygen: Secondary | ICD-10-CM | POA: Diagnosis not present

## 2022-01-13 DIAGNOSIS — C7802 Secondary malignant neoplasm of left lung: Secondary | ICD-10-CM | POA: Diagnosis not present

## 2022-01-13 DIAGNOSIS — Z510 Encounter for antineoplastic radiation therapy: Secondary | ICD-10-CM | POA: Diagnosis not present

## 2022-01-13 DIAGNOSIS — Z171 Estrogen receptor negative status [ER-]: Secondary | ICD-10-CM | POA: Diagnosis not present

## 2022-01-13 LAB — RAD ONC ARIA SESSION SUMMARY
Course Elapsed Days: 14
Plan Fractions Treated to Date: 11
Plan Prescribed Dose Per Fraction: 2 Gy
Plan Total Fractions Prescribed: 20
Plan Total Prescribed Dose: 40 Gy
Reference Point Dosage Given to Date: 22 Gy
Reference Point Session Dosage Given: 2 Gy
Session Number: 11

## 2022-01-13 MED ORDER — MIRTAZAPINE 7.5 MG PO TABS
7.5000 mg | ORAL_TABLET | Freq: Every day | ORAL | 2 refills | Status: DC
  Filled 2022-01-13: qty 30, 30d supply, fill #0

## 2022-01-13 NOTE — Progress Notes (Signed)
I spoke with patient/daughter. Patient is having difficulty initiating sleep at night. Will start mirtazapine as this might also help with appetite. Start mirtazapine 7.5mg  QHS.   Patient is scheduled for thora tomorrow.

## 2022-01-14 ENCOUNTER — Inpatient Hospital Stay: Payer: 59

## 2022-01-14 ENCOUNTER — Telehealth: Payer: Self-pay | Admitting: *Deleted

## 2022-01-14 ENCOUNTER — Other Ambulatory Visit: Payer: Self-pay

## 2022-01-14 ENCOUNTER — Ambulatory Visit
Admission: RE | Admit: 2022-01-14 | Discharge: 2022-01-14 | Disposition: A | Discharge status: Home / Self Care | Payer: 59 | Source: Ambulatory Visit | Attending: Radiation Oncology | Admitting: Radiation Oncology

## 2022-01-14 ENCOUNTER — Other Ambulatory Visit: Payer: Self-pay | Admitting: Oncology

## 2022-01-14 ENCOUNTER — Other Ambulatory Visit: Payer: Self-pay | Admitting: Hospice and Palliative Medicine

## 2022-01-14 ENCOUNTER — Inpatient Hospital Stay (HOSPITAL_BASED_OUTPATIENT_CLINIC_OR_DEPARTMENT_OTHER): Payer: 59 | Admitting: Oncology

## 2022-01-14 ENCOUNTER — Encounter: Payer: Self-pay | Admitting: Oncology

## 2022-01-14 ENCOUNTER — Ambulatory Visit
Admission: RE | Admit: 2022-01-14 | Discharge: 2022-01-14 | Disposition: A | Discharge status: Home / Self Care | Payer: 59 | Source: Ambulatory Visit | Attending: Oncology | Admitting: Oncology

## 2022-01-14 ENCOUNTER — Ambulatory Visit
Admission: RE | Admit: 2022-01-14 | Discharge: 2022-01-14 | Disposition: A | Discharge status: Home / Self Care | Payer: 59 | Source: Ambulatory Visit | Attending: Hospice and Palliative Medicine | Admitting: Hospice and Palliative Medicine

## 2022-01-14 VITALS — BP 111/74 | HR 111 | Temp 98.4°F | Resp 18 | Wt 128.0 lb

## 2022-01-14 DIAGNOSIS — M79602 Pain in left arm: Secondary | ICD-10-CM

## 2022-01-14 DIAGNOSIS — Z171 Estrogen receptor negative status [ER-]: Secondary | ICD-10-CM | POA: Diagnosis not present

## 2022-01-14 DIAGNOSIS — R0602 Shortness of breath: Secondary | ICD-10-CM

## 2022-01-14 DIAGNOSIS — C7801 Secondary malignant neoplasm of right lung: Secondary | ICD-10-CM | POA: Diagnosis not present

## 2022-01-14 DIAGNOSIS — G893 Neoplasm related pain (acute) (chronic): Secondary | ICD-10-CM | POA: Diagnosis not present

## 2022-01-14 DIAGNOSIS — J90 Pleural effusion, not elsewhere classified: Secondary | ICD-10-CM | POA: Insufficient documentation

## 2022-01-14 DIAGNOSIS — C7802 Secondary malignant neoplasm of left lung: Secondary | ICD-10-CM | POA: Diagnosis not present

## 2022-01-14 DIAGNOSIS — M25512 Pain in left shoulder: Secondary | ICD-10-CM | POA: Diagnosis not present

## 2022-01-14 DIAGNOSIS — C50411 Malignant neoplasm of upper-outer quadrant of right female breast: Secondary | ICD-10-CM | POA: Diagnosis not present

## 2022-01-14 DIAGNOSIS — C7951 Secondary malignant neoplasm of bone: Secondary | ICD-10-CM | POA: Diagnosis not present

## 2022-01-14 DIAGNOSIS — Z9981 Dependence on supplemental oxygen: Secondary | ICD-10-CM | POA: Diagnosis not present

## 2022-01-14 DIAGNOSIS — M79622 Pain in left upper arm: Secondary | ICD-10-CM | POA: Diagnosis not present

## 2022-01-14 DIAGNOSIS — Z510 Encounter for antineoplastic radiation therapy: Secondary | ICD-10-CM | POA: Diagnosis not present

## 2022-01-14 DIAGNOSIS — J9601 Acute respiratory failure with hypoxia: Secondary | ICD-10-CM | POA: Diagnosis not present

## 2022-01-14 DIAGNOSIS — M79632 Pain in left forearm: Secondary | ICD-10-CM | POA: Diagnosis not present

## 2022-01-14 LAB — RAD ONC ARIA SESSION SUMMARY
Course Elapsed Days: 15
Plan Fractions Treated to Date: 12
Plan Prescribed Dose Per Fraction: 2 Gy
Plan Total Fractions Prescribed: 20
Plan Total Prescribed Dose: 40 Gy
Reference Point Dosage Given to Date: 24 Gy
Reference Point Session Dosage Given: 2 Gy
Session Number: 12

## 2022-01-14 MED ORDER — KETOROLAC TROMETHAMINE 15 MG/ML IJ SOLN
15.0000 mg | Freq: Once | INTRAMUSCULAR | Status: AC
  Administered 2022-01-14: 15 mg via INTRAMUSCULAR

## 2022-01-14 MED ORDER — KETOROLAC TROMETHAMINE 15 MG/ML IJ SOLN
15.0000 mg | Freq: Once | INTRAMUSCULAR | Status: DC
  Filled 2022-01-14: qty 1

## 2022-01-14 NOTE — Telephone Encounter (Signed)
Patient is going to get xray then will come to see [name] afterwards. Per [name]

## 2022-01-14 NOTE — Progress Notes (Signed)
Pt states she is experiencing pain on the left side underneath her armpit and all she was trying to do was push herself back.

## 2022-01-14 NOTE — Telephone Encounter (Signed)
Patient called reporting that while she was positioning herself last night she put pressure on her arms to slide back on the couch, she had a severe and sudden sharp pain in her left arm radiating into her armpit and chest. Every time she moves it, she has an intense sharp pain on top of the pian she has in that arm. She does not know if it popped or not as "it happened so fast, I just don't know". Please advise

## 2022-01-16 ENCOUNTER — Encounter: Payer: Self-pay | Admitting: Oncology

## 2022-01-16 NOTE — Progress Notes (Signed)
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 know.  I am going to see her on 02/02/2022 as planned.  We are giving her Toradol 15 mg IM in our clinic today.  She will be undergoing thoracentesis later today as well   Visit Diagnosis 1. Left arm pain      Dr. Randa Evens, MD, MPH [facility] at [facility] [phone] 01/16/2022 9:46 AM

## 2022-01-19 ENCOUNTER — Ambulatory Visit: Payer: 59

## 2022-01-19 ENCOUNTER — Other Ambulatory Visit: Payer: Self-pay

## 2022-01-19 ENCOUNTER — Ambulatory Visit
Admission: RE | Admit: 2022-01-19 | Discharge: 2022-01-19 | Disposition: A | Discharge status: Home / Self Care | Payer: 59 | Source: Ambulatory Visit | Attending: Radiation Oncology | Admitting: Radiation Oncology

## 2022-01-19 DIAGNOSIS — J9601 Acute respiratory failure with hypoxia: Secondary | ICD-10-CM | POA: Diagnosis not present

## 2022-01-19 DIAGNOSIS — G893 Neoplasm related pain (acute) (chronic): Secondary | ICD-10-CM | POA: Diagnosis not present

## 2022-01-19 DIAGNOSIS — M79602 Pain in left arm: Secondary | ICD-10-CM | POA: Diagnosis not present

## 2022-01-19 DIAGNOSIS — Z9981 Dependence on supplemental oxygen: Secondary | ICD-10-CM | POA: Diagnosis not present

## 2022-01-19 DIAGNOSIS — C50411 Malignant neoplasm of upper-outer quadrant of right female breast: Secondary | ICD-10-CM | POA: Diagnosis not present

## 2022-01-19 DIAGNOSIS — C7951 Secondary malignant neoplasm of bone: Secondary | ICD-10-CM | POA: Diagnosis not present

## 2022-01-19 DIAGNOSIS — C7802 Secondary malignant neoplasm of left lung: Secondary | ICD-10-CM | POA: Diagnosis not present

## 2022-01-19 DIAGNOSIS — Z171 Estrogen receptor negative status [ER-]: Secondary | ICD-10-CM | POA: Diagnosis not present

## 2022-01-19 DIAGNOSIS — Z510 Encounter for antineoplastic radiation therapy: Secondary | ICD-10-CM | POA: Diagnosis not present

## 2022-01-19 DIAGNOSIS — C7801 Secondary malignant neoplasm of right lung: Secondary | ICD-10-CM | POA: Diagnosis not present

## 2022-01-19 LAB — RAD ONC ARIA SESSION SUMMARY
Course Elapsed Days: 20
Plan Fractions Treated to Date: 13
Plan Prescribed Dose Per Fraction: 2 Gy
Plan Total Fractions Prescribed: 20
Plan Total Prescribed Dose: 40 Gy
Reference Point Dosage Given to Date: 26 Gy
Reference Point Session Dosage Given: 2 Gy
Session Number: 13

## 2022-01-20 ENCOUNTER — Ambulatory Visit: Payer: 59

## 2022-01-20 ENCOUNTER — Other Ambulatory Visit: Payer: Self-pay

## 2022-01-20 ENCOUNTER — Other Ambulatory Visit: Payer: Self-pay | Admitting: *Deleted

## 2022-01-20 ENCOUNTER — Ambulatory Visit
Admission: RE | Admit: 2022-01-20 | Discharge: 2022-01-20 | Disposition: A | Discharge status: Home / Self Care | Payer: 59 | Source: Ambulatory Visit | Attending: Radiation Oncology | Admitting: Radiation Oncology

## 2022-01-20 DIAGNOSIS — C50411 Malignant neoplasm of upper-outer quadrant of right female breast: Secondary | ICD-10-CM | POA: Diagnosis not present

## 2022-01-20 DIAGNOSIS — Z510 Encounter for antineoplastic radiation therapy: Secondary | ICD-10-CM | POA: Diagnosis not present

## 2022-01-20 DIAGNOSIS — J9601 Acute respiratory failure with hypoxia: Secondary | ICD-10-CM | POA: Diagnosis not present

## 2022-01-20 DIAGNOSIS — G893 Neoplasm related pain (acute) (chronic): Secondary | ICD-10-CM | POA: Diagnosis not present

## 2022-01-20 DIAGNOSIS — C7802 Secondary malignant neoplasm of left lung: Secondary | ICD-10-CM | POA: Diagnosis not present

## 2022-01-20 DIAGNOSIS — M79602 Pain in left arm: Secondary | ICD-10-CM | POA: Diagnosis not present

## 2022-01-20 DIAGNOSIS — C779 Secondary and unspecified malignant neoplasm of lymph node, unspecified: Secondary | ICD-10-CM

## 2022-01-20 DIAGNOSIS — C7951 Secondary malignant neoplasm of bone: Secondary | ICD-10-CM | POA: Diagnosis not present

## 2022-01-20 DIAGNOSIS — C7801 Secondary malignant neoplasm of right lung: Secondary | ICD-10-CM | POA: Diagnosis not present

## 2022-01-20 DIAGNOSIS — Z171 Estrogen receptor negative status [ER-]: Secondary | ICD-10-CM | POA: Diagnosis not present

## 2022-01-20 DIAGNOSIS — Z9981 Dependence on supplemental oxygen: Secondary | ICD-10-CM | POA: Diagnosis not present

## 2022-01-20 LAB — RAD ONC ARIA SESSION SUMMARY
Course Elapsed Days: 21
Plan Fractions Treated to Date: 14
Plan Prescribed Dose Per Fraction: 2 Gy
Plan Total Fractions Prescribed: 20
Plan Total Prescribed Dose: 40 Gy
Reference Point Dosage Given to Date: 28 Gy
Reference Point Session Dosage Given: 2 Gy
Session Number: 14

## 2022-01-21 ENCOUNTER — Other Ambulatory Visit: Payer: Self-pay

## 2022-01-21 ENCOUNTER — Encounter: Payer: Self-pay | Admitting: Internal Medicine

## 2022-01-21 ENCOUNTER — Inpatient Hospital Stay
Admission: EM | Admit: 2022-01-21 | Discharge: 2022-01-25 | DRG: 871 | Disposition: A | Discharge status: Home / Self Care | Payer: 59 | Source: Ambulatory Visit | Attending: Internal Medicine | Admitting: Internal Medicine

## 2022-01-21 ENCOUNTER — Ambulatory Visit
Admission: RE | Admit: 2022-01-21 | Discharge: 2022-01-21 | Disposition: A | Discharge status: Home / Self Care | Payer: 59 | Source: Ambulatory Visit | Attending: Radiation Oncology | Admitting: Radiation Oncology

## 2022-01-21 ENCOUNTER — Inpatient Hospital Stay: Payer: 59

## 2022-01-21 ENCOUNTER — Emergency Department: Payer: 59

## 2022-01-21 ENCOUNTER — Telehealth: Payer: Self-pay | Admitting: *Deleted

## 2022-01-21 ENCOUNTER — Ambulatory Visit: Payer: 59

## 2022-01-21 ENCOUNTER — Ambulatory Visit
Admission: RE | Admit: 2022-01-21 | Discharge: 2022-01-21 | Disposition: A | Discharge status: Home / Self Care | Payer: 59 | Source: Home / Self Care | Attending: Hospice and Palliative Medicine | Admitting: Hospice and Palliative Medicine

## 2022-01-21 ENCOUNTER — Other Ambulatory Visit: Payer: Self-pay | Admitting: *Deleted

## 2022-01-21 ENCOUNTER — Ambulatory Visit
Admission: RE | Admit: 2022-01-21 | Discharge: 2022-01-21 | Disposition: A | Discharge status: Home / Self Care | Payer: 59 | Source: Ambulatory Visit | Attending: Hospice and Palliative Medicine | Admitting: Hospice and Palliative Medicine

## 2022-01-21 ENCOUNTER — Inpatient Hospital Stay (HOSPITAL_BASED_OUTPATIENT_CLINIC_OR_DEPARTMENT_OTHER): Payer: 59 | Admitting: Hospice and Palliative Medicine

## 2022-01-21 VITALS — BP 114/74 | HR 121 | Temp 99.0°F | Ht 59.0 in | Wt 129.0 lb

## 2022-01-21 DIAGNOSIS — Z515 Encounter for palliative care: Secondary | ICD-10-CM | POA: Diagnosis not present

## 2022-01-21 DIAGNOSIS — R911 Solitary pulmonary nodule: Secondary | ICD-10-CM | POA: Diagnosis not present

## 2022-01-21 DIAGNOSIS — R0602 Shortness of breath: Secondary | ICD-10-CM

## 2022-01-21 DIAGNOSIS — J910 Malignant pleural effusion: Secondary | ICD-10-CM | POA: Diagnosis present

## 2022-01-21 DIAGNOSIS — A419 Sepsis, unspecified organism: Principal | ICD-10-CM | POA: Diagnosis present

## 2022-01-21 DIAGNOSIS — F32A Depression, unspecified: Secondary | ICD-10-CM | POA: Diagnosis present

## 2022-01-21 DIAGNOSIS — F419 Anxiety disorder, unspecified: Secondary | ICD-10-CM | POA: Diagnosis present

## 2022-01-21 DIAGNOSIS — Z510 Encounter for antineoplastic radiation therapy: Secondary | ICD-10-CM | POA: Diagnosis not present

## 2022-01-21 DIAGNOSIS — C779 Secondary and unspecified malignant neoplasm of lymph node, unspecified: Secondary | ICD-10-CM

## 2022-01-21 DIAGNOSIS — Z823 Family history of stroke: Secondary | ICD-10-CM | POA: Diagnosis not present

## 2022-01-21 DIAGNOSIS — M79602 Pain in left arm: Secondary | ICD-10-CM | POA: Diagnosis present

## 2022-01-21 DIAGNOSIS — C782 Secondary malignant neoplasm of pleura: Secondary | ICD-10-CM | POA: Diagnosis present

## 2022-01-21 DIAGNOSIS — R040 Epistaxis: Secondary | ICD-10-CM | POA: Diagnosis present

## 2022-01-21 DIAGNOSIS — J9601 Acute respiratory failure with hypoxia: Secondary | ICD-10-CM | POA: Diagnosis not present

## 2022-01-21 DIAGNOSIS — Z923 Personal history of irradiation: Secondary | ICD-10-CM | POA: Diagnosis not present

## 2022-01-21 DIAGNOSIS — J189 Pneumonia, unspecified organism: Secondary | ICD-10-CM

## 2022-01-21 DIAGNOSIS — J90 Pleural effusion, not elsewhere classified: Secondary | ICD-10-CM

## 2022-01-21 DIAGNOSIS — Z808 Family history of malignant neoplasm of other organs or systems: Secondary | ICD-10-CM

## 2022-01-21 DIAGNOSIS — G43909 Migraine, unspecified, not intractable, without status migrainosus: Secondary | ICD-10-CM | POA: Diagnosis present

## 2022-01-21 DIAGNOSIS — R7989 Other specified abnormal findings of blood chemistry: Secondary | ICD-10-CM | POA: Diagnosis not present

## 2022-01-21 DIAGNOSIS — Z8249 Family history of ischemic heart disease and other diseases of the circulatory system: Secondary | ICD-10-CM | POA: Diagnosis not present

## 2022-01-21 DIAGNOSIS — Z170 Estrogen receptor positive status [ER+]: Secondary | ICD-10-CM | POA: Diagnosis not present

## 2022-01-21 DIAGNOSIS — Z853 Personal history of malignant neoplasm of breast: Secondary | ICD-10-CM | POA: Diagnosis not present

## 2022-01-21 DIAGNOSIS — J4520 Mild intermittent asthma, uncomplicated: Secondary | ICD-10-CM | POA: Diagnosis present

## 2022-01-21 DIAGNOSIS — Z9221 Personal history of antineoplastic chemotherapy: Secondary | ICD-10-CM

## 2022-01-21 DIAGNOSIS — C50919 Malignant neoplasm of unspecified site of unspecified female breast: Secondary | ICD-10-CM | POA: Diagnosis present

## 2022-01-21 DIAGNOSIS — C771 Secondary and unspecified malignant neoplasm of intrathoracic lymph nodes: Secondary | ICD-10-CM | POA: Diagnosis not present

## 2022-01-21 DIAGNOSIS — C7951 Secondary malignant neoplasm of bone: Secondary | ICD-10-CM | POA: Diagnosis not present

## 2022-01-21 DIAGNOSIS — Z66 Do not resuscitate: Secondary | ICD-10-CM | POA: Diagnosis present

## 2022-01-21 DIAGNOSIS — Z79899 Other long term (current) drug therapy: Secondary | ICD-10-CM

## 2022-01-21 DIAGNOSIS — C7801 Secondary malignant neoplasm of right lung: Secondary | ICD-10-CM | POA: Diagnosis not present

## 2022-01-21 DIAGNOSIS — R0600 Dyspnea, unspecified: Principal | ICD-10-CM

## 2022-01-21 DIAGNOSIS — J9611 Chronic respiratory failure with hypoxia: Secondary | ICD-10-CM | POA: Diagnosis present

## 2022-01-21 DIAGNOSIS — G893 Neoplasm related pain (acute) (chronic): Secondary | ICD-10-CM | POA: Diagnosis present

## 2022-01-21 DIAGNOSIS — Z888 Allergy status to other drugs, medicaments and biological substances status: Secondary | ICD-10-CM

## 2022-01-21 DIAGNOSIS — Z79890 Hormone replacement therapy (postmenopausal): Secondary | ICD-10-CM

## 2022-01-21 DIAGNOSIS — K219 Gastro-esophageal reflux disease without esophagitis: Secondary | ICD-10-CM | POA: Diagnosis present

## 2022-01-21 DIAGNOSIS — C7802 Secondary malignant neoplasm of left lung: Secondary | ICD-10-CM | POA: Diagnosis not present

## 2022-01-21 DIAGNOSIS — R5381 Other malaise: Secondary | ICD-10-CM | POA: Diagnosis present

## 2022-01-21 DIAGNOSIS — R112 Nausea with vomiting, unspecified: Secondary | ICD-10-CM

## 2022-01-21 DIAGNOSIS — Z171 Estrogen receptor negative status [ER-]: Secondary | ICD-10-CM | POA: Diagnosis not present

## 2022-01-21 DIAGNOSIS — C50411 Malignant neoplasm of upper-outer quadrant of right female breast: Secondary | ICD-10-CM | POA: Diagnosis not present

## 2022-01-21 DIAGNOSIS — Z9981 Dependence on supplemental oxygen: Secondary | ICD-10-CM | POA: Diagnosis not present

## 2022-01-21 DIAGNOSIS — M79601 Pain in right arm: Secondary | ICD-10-CM | POA: Diagnosis present

## 2022-01-21 LAB — COMPREHENSIVE METABOLIC PANEL
ALT: 21 U/L (ref 0–44)
AST: 19 U/L (ref 15–41)
Albumin: 2.9 g/dL — ABNORMAL LOW (ref 3.5–5.0)
Alkaline Phosphatase: 90 U/L (ref 38–126)
Anion gap: 9 (ref 5–15)
BUN: 19 mg/dL (ref 8–23)
CO2: 26 mmol/L (ref 22–32)
Calcium: 9.2 mg/dL (ref 8.9–10.3)
Chloride: 102 mmol/L (ref 98–111)
Creatinine, Ser: 0.69 mg/dL (ref 0.44–1.00)
GFR, Estimated: >60 mL/min (ref >60)
Glucose, Bld: 123 mg/dL — ABNORMAL HIGH (ref 70–99)
Potassium: 3.8 mmol/L (ref 3.5–5.1)
Sodium: 137 mmol/L (ref 135–145)
Total Bilirubin: 0.7 mg/dL (ref 0.3–1.2)
Total Protein: 6.3 g/dL — ABNORMAL LOW (ref 6.5–8.1)

## 2022-01-21 LAB — CBC
HCT: 36.3 % (ref 36.0–46.0)
HCT: 34.4 % — ABNORMAL LOW (ref 36.0–46.0)
Hemoglobin: 12.5 g/dL (ref 12.0–15.0)
Hemoglobin: 11.9 g/dL — ABNORMAL LOW (ref 12.0–15.0)
MCH: 33.2 pg (ref 26.0–34.0)
MCH: 33 pg (ref 26.0–34.0)
MCHC: 34.4 g/dL (ref 30.0–36.0)
MCHC: 34.6 g/dL (ref 30.0–36.0)
MCV: 96.3 fL (ref 80.0–100.0)
MCV: 95.3 fL (ref 80.0–100.0)
Platelets: 196 10*3/uL (ref 150–400)
Platelets: 202 10*3/uL (ref 150–400)
RBC: 3.77 MIL/uL — ABNORMAL LOW (ref 3.87–5.11)
RBC: 3.61 MIL/uL — ABNORMAL LOW (ref 3.87–5.11)
RDW: 15.3 % (ref 11.5–15.5)
RDW: 15.3 % (ref 11.5–15.5)
WBC: 9.6 10*3/uL (ref 4.0–10.5)
WBC: 9.7 10*3/uL (ref 4.0–10.5)
nRBC: 0 % (ref 0.0–0.2)
nRBC: 0 % (ref 0.0–0.2)

## 2022-01-21 LAB — CBC WITH DIFFERENTIAL/PLATELET
Abs Immature Granulocytes: 0.05 10*3/uL (ref 0.00–0.07)
Basophils Absolute: 0 10*3/uL (ref 0.0–0.1)
Basophils Relative: 0 %
Eosinophils Absolute: 0 10*3/uL (ref 0.0–0.5)
Eosinophils Relative: 0 %
HCT: 35.1 % — ABNORMAL LOW (ref 36.0–46.0)
Hemoglobin: 11.9 g/dL — ABNORMAL LOW (ref 12.0–15.0)
Immature Granulocytes: 1 %
Lymphocytes Relative: 2 %
Lymphs Abs: 0.2 10*3/uL — ABNORMAL LOW (ref 0.7–4.0)
MCH: 32.8 pg (ref 26.0–34.0)
MCHC: 33.9 g/dL (ref 30.0–36.0)
MCV: 96.7 fL (ref 80.0–100.0)
Monocytes Absolute: 0.4 10*3/uL (ref 0.1–1.0)
Monocytes Relative: 4 %
Neutro Abs: 9.1 10*3/uL — ABNORMAL HIGH (ref 1.7–7.7)
Neutrophils Relative %: 93 %
Platelets: 227 10*3/uL (ref 150–400)
RBC: 3.63 MIL/uL — ABNORMAL LOW (ref 3.87–5.11)
RDW: 15.2 % (ref 11.5–15.5)
WBC: 9.8 10*3/uL (ref 4.0–10.5)
nRBC: 0 % (ref 0.0–0.2)

## 2022-01-21 LAB — RAD ONC ARIA SESSION SUMMARY
Course Elapsed Days: 22
Plan Fractions Treated to Date: 15
Plan Prescribed Dose Per Fraction: 2 Gy
Plan Total Fractions Prescribed: 20
Plan Total Prescribed Dose: 40 Gy
Reference Point Dosage Given to Date: 30 Gy
Reference Point Session Dosage Given: 2 Gy
Session Number: 15

## 2022-01-21 LAB — PROCALCITONIN: Procalcitonin: 0.12 ng/mL

## 2022-01-21 LAB — D-DIMER, QUANTITATIVE: D-Dimer, Quant: 9.93 ug/mL-FEU — ABNORMAL HIGH (ref 0.00–0.50)

## 2022-01-21 MED ORDER — ONDANSETRON HCL 4 MG PO TABS: 4.0000 mg | ORAL_TABLET | Freq: Three times a day (TID) | ORAL | Status: DC | PRN

## 2022-01-21 MED ORDER — ONDANSETRON 4 MG PO TBDP
4.0000 mg | ORAL_TABLET | Freq: Three times a day (TID) | ORAL | 3 refills | Status: DC | PRN
  Filled 2022-01-21: qty 30, 10d supply, fill #0

## 2022-01-21 MED ORDER — ENOXAPARIN SODIUM 40 MG/0.4ML IJ SOSY
40.0000 mg | PREFILLED_SYRINGE | INTRAMUSCULAR | Status: DC
  Administered 2022-01-21 – 2022-01-23 (×3): 40 mg via SUBCUTANEOUS
  Filled 2022-01-21 (×3): qty 0.4

## 2022-01-21 MED ORDER — SODIUM CHLORIDE 0.9 % IV SOLN
500.0000 mg | INTRAVENOUS | Status: DC
  Administered 2022-01-22 – 2022-01-24 (×3): 500 mg via INTRAVENOUS
  Filled 2022-01-21: qty 500
  Filled 2022-01-21: qty 5
  Filled 2022-01-21: qty 500

## 2022-01-21 MED ORDER — MIRTAZAPINE 15 MG PO TABS
7.5000 mg | ORAL_TABLET | Freq: Every day | ORAL | Status: DC
  Administered 2022-01-21 – 2022-01-24 (×4): 7.5 mg via ORAL
  Filled 2022-01-21 (×5): qty 1

## 2022-01-21 MED ORDER — DEXAMETHASONE 4 MG PO TABS
4.0000 mg | ORAL_TABLET | Freq: Two times a day (BID) | ORAL | Status: DC
  Filled 2022-01-21 (×2): qty 1

## 2022-01-21 MED ORDER — ACETAMINOPHEN 500 MG PO TABS: 1000.0000 mg | ORAL_TABLET | ORAL | Status: DC | PRN

## 2022-01-21 MED ORDER — IOHEXOL 350 MG/ML SOLN
75.0000 mL | Freq: Once | INTRAVENOUS | Status: AC | PRN
  Administered 2022-01-21: 75 mL via INTRAVENOUS

## 2022-01-21 MED ORDER — LORAZEPAM 0.5 MG PO TABS
0.5000 mg | ORAL_TABLET | Freq: Three times a day (TID) | ORAL | Status: DC | PRN
  Administered 2022-01-21 – 2022-01-24 (×4): 0.5 mg via ORAL
  Filled 2022-01-21 (×4): qty 1

## 2022-01-21 MED ORDER — SODIUM CHLORIDE 0.9 % IV SOLN
1.0000 g | Freq: Once | INTRAVENOUS | Status: AC
  Administered 2022-01-21: 1 g via INTRAVENOUS
  Filled 2022-01-21: qty 10

## 2022-01-21 MED ORDER — IBUPROFEN 400 MG PO TABS: 400.0000 mg | ORAL_TABLET | Freq: Four times a day (QID) | ORAL | Status: DC | PRN

## 2022-01-21 MED ORDER — ESTRADIOL 0.1 MG/GM VA CREA
1.0000 | TOPICAL_CREAM | VAGINAL | Status: DC
  Administered 2022-01-23: 1 via VAGINAL
  Filled 2022-01-21: qty 42.5

## 2022-01-21 MED ORDER — IPRATROPIUM-ALBUTEROL 0.5-2.5 (3) MG/3ML IN SOLN
3.0000 mL | Freq: Four times a day (QID) | RESPIRATORY_TRACT | Status: DC
  Administered 2022-01-21: 3 mL via RESPIRATORY_TRACT

## 2022-01-21 MED ORDER — SODIUM CHLORIDE 0.9 % IV SOLN
500.0000 mg | Freq: Once | INTRAVENOUS | Status: AC
  Administered 2022-01-21: 500 mg via INTRAVENOUS
  Filled 2022-01-21: qty 5

## 2022-01-21 MED ORDER — SODIUM CHLORIDE 0.9 % IV SOLN
2.0000 g | INTRAVENOUS | Status: DC
  Administered 2022-01-22 – 2022-01-24 (×3): 2 g via INTRAVENOUS
  Filled 2022-01-21: qty 20
  Filled 2022-01-21 (×2): qty 2
  Filled 2022-01-21: qty 20

## 2022-01-21 MED ORDER — SALINE SPRAY 0.65 % NA SOLN
2.0000 | NASAL | Status: DC | PRN
  Administered 2022-01-22: 2 via NASAL
  Filled 2022-01-21: qty 44

## 2022-01-21 MED ORDER — SODIUM CHLORIDE 0.9 % IV SOLN: INTRAVENOUS | Status: DC

## 2022-01-21 MED ORDER — GABAPENTIN 100 MG PO CAPS
100.0000 mg | ORAL_CAPSULE | Freq: Three times a day (TID) | ORAL | Status: DC
  Filled 2022-01-21 (×2): qty 1

## 2022-01-21 MED ORDER — MORPHINE SULFATE (CONCENTRATE) 10 MG/0.5ML PO SOLN
5.0000 mg | ORAL | Status: DC | PRN
  Administered 2022-01-21 – 2022-01-23 (×4): 10 mg via ORAL
  Filled 2022-01-21 (×4): qty 0.5

## 2022-01-21 MED ORDER — SODIUM CHLORIDE 0.9 % IV BOLUS
500.0000 mL | Freq: Once | INTRAVENOUS | Status: AC
  Administered 2022-01-21: 500 mL via INTRAVENOUS

## 2022-01-21 MED ORDER — TRAMADOL HCL 50 MG PO TABS: 50.0000 mg | ORAL_TABLET | Freq: Four times a day (QID) | ORAL | Status: DC | PRN

## 2022-01-21 MED ORDER — SUCRALFATE 1 GM/10ML PO SUSP
1.0000 g | Freq: Three times a day (TID) | ORAL | Status: DC
  Administered 2022-01-22 – 2022-01-25 (×14): 1 g via ORAL
  Filled 2022-01-21 (×16): qty 10

## 2022-01-21 NOTE — Progress Notes (Signed)
  Interdisciplinary Goals of Care Family Meeting   Date carried out: 01/21/2022  Location of the meeting: Bedside  Member's involved: Physician and Family Member or next of kin  Durable Power of Attorney or Loss adjuster, chartered: Patient  Discussion: We discussed goals of care for Amanda Skinner .  The patient has metastatic breast cancer and we discussed CODE STATUS.  Initially the patient decided on a DO NOT RESUSCITATE but later I was called by the nurse to change back to full CODE STATUS.  Further goals of care conversations will be needed in the future.  The patient expressed that she does not want to suffer with breathing like she is suffocating.  Code status: Full Code  Disposition: Continue current acute care  Time spent for the meeting: 15 minutes    Loletha Grayer, MD  01/21/2022, 9:34 PM

## 2022-01-21 NOTE — Assessment & Plan Note (Addendum)
We will give nebulizers.  Continue Decadron.

## 2022-01-21 NOTE — Progress Notes (Signed)
Pt arrived to floor , without writers knowledged, no report given , nor was pt assigned to room . Pt very anxious with labored breathing and in pain. Over due bolus started  and meds given . Pt was a red mews prior to her arrival and is now a yellow mews . Family at bed side
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
## 2022-01-21 NOTE — Assessment & Plan Note (Signed)
Started on oxygen around 3 to 4 weeks ago on 2 L of oxygen.

## 2022-01-21 NOTE — ED Triage Notes (Signed)
Pt here from the cancer center with SOB. Pt has bilateral breast cancer, oxygen sats in the low 90s. Pt wears 2L BNC.

## 2022-01-21 NOTE — Progress Notes (Addendum)
Patient transferred to a floor that could actively monitor telemetry as ordered by MD.

## 2022-01-21 NOTE — ED Notes (Signed)
Hospitalitis at bedside

## 2022-01-21 NOTE — Telephone Encounter (Signed)
Patient called reporting that as the days has progressed, she has become more shortness of breath, she is wheezing and has a non productive cough. She has temp of 99.5 She states that she  was fine when she got up this morning. Her arms continue to hurt. Was to have a thoracentesis last week but they did not do it as it was too risky

## 2022-01-21 NOTE — Progress Notes (Signed)
Per v/o RN Transporting pt to ER via w/c

## 2022-01-21 NOTE — Assessment & Plan Note (Signed)
On Carafate

## 2022-01-21 NOTE — ED Notes (Signed)
EKG completed per Dr Sonny Dandy request

## 2022-01-21 NOTE — Telephone Encounter (Signed)
Per v/o Josh. Patient requires a CXR-and f/u in smc today. Pt will obtain cxr now prior to radiation therapy and then will come to the cancer center after xray for radiation and smc visit. Orders entered. Pt aware and agreeable to plan of care.

## 2022-01-21 NOTE — Assessment & Plan Note (Signed)
CT scan negative for PE.  We will get an ultrasound of the lower extremity.

## 2022-01-21 NOTE — Assessment & Plan Note (Signed)
Metastatic to bone, lungs, lung pleura and lymph nodes.  Oncology consultation.

## 2022-01-21 NOTE — ED Provider Notes (Signed)
[facility] Provider Note    Event Date/Time   First MD Initiated Contact with Patient 01/21/22 1522     (approximate)   History   Shortness of Breath   HPI  Amanda Skinner is a 61 y.o. female with bilateral breast cancer and low O2 sats on her usual 2 L of nasal cannula oxygen.  She was sent from the cancer center complaining of shortness of breath.  Patient reports she has had a nonproductive cough and has been feeling short of breath.  She has had a low-grade fever up to 100.7 max today its been in the 99 range.  She has had symptoms for 2 to 3 days.      Physical Exam   Triage Vital Signs: ED Triage Vitals  Enc Vitals Group     BP 01/21/22 1528 111/70     Pulse Rate 01/21/22 1528 (!) 113     Resp 01/21/22 1528 18     Temp 01/21/22 1528 98.7 F (37.1 C)     Temp Source 01/21/22 1528 Oral     SpO2 01/21/22 1528 96 %     Weight 01/21/22 1524 128 lb 15.5 oz (58.5 kg)     Height 01/21/22 1524 4\' 11"  (1.499 m)     Head Circumference --      Peak Flow --      Pain Score 01/21/22 1524 4     Pain Loc --      Pain Edu? --      Excl. in [place]? --     Most recent vital signs: Vitals:   01/21/22 2115 01/21/22 2303  BP:  111/78  Pulse: (!) 116 (!) 121  Resp: 18 18  Temp:  97.9 F (36.6 C)  SpO2: 94% 94%     General: Awake, no distress.  But breathing hard CV:  Good peripheral perfusion.  Heart regular rate and rhythm no audible murmurs Resp:  sLightly increased effort and rate Abd:  No distention.  Soft and nontender Extremities with no edema   ED Results / Procedures / Treatments   Labs (all labs ordered are listed, but only abnormal results are displayed) Labs Reviewed  CBC - Abnormal; Notable for the following components:      Result Value   RBC 3.61 (*)    Hemoglobin 11.9 (*)    HCT 34.4 (*)    All other components within normal limits  COMPREHENSIVE METABOLIC PANEL - Abnormal; Notable for the following components:    Glucose, Bld 123 (*)    Total Protein 6.3 (*)    Albumin 2.9 (*)    All other components within normal limits  D-DIMER, QUANTITATIVE - Abnormal; Notable for the following components:   D-Dimer, Quant 9.93 (*)    All other components within normal limits  CBC WITH DIFFERENTIAL/PLATELET - Abnormal; Notable for the following components:   RBC 3.63 (*)    Hemoglobin 11.9 (*)    HCT 35.1 (*)    Neutro Abs 9.1 (*)    Lymphs Abs 0.2 (*)    All other components within normal limits  CULTURE, BLOOD (ROUTINE X 2) W REFLEX TO ID PANEL  CULTURE, BLOOD (ROUTINE X 2) W REFLEX TO ID PANEL  PROCALCITONIN  HIV ANTIBODY (ROUTINE TESTING W REFLEX)  BASIC METABOLIC PANEL  CBC     EKG  EKG read and interpreted by me shows sinus tach rate of 115 normal axis no acute ST-T changes ----------------------------------------- 7:17 PM on  01/21/2022 ----------------------------------------- EKG done after walking with chest pain shows sinus tachycardia rate of 110 normal axis no acute ST-T changes.  RADIOLOGY Chest x-ray read by radiology shows worsening bilateral small pleural effusions and possible infiltrate or atelectasis.   PROCEDURES:  Critical Care performed:   Procedures   MEDICATIONS ORDERED IN ED: Medications  acetaminophen (TYLENOL) tablet 1,000 mg (has no administration in time range)  ibuprofen (ADVIL) tablet 400 mg (has no administration in time range)  morphine CONCENTRATE 10 MG/0.5ML oral solution 5-10 mg (10 mg Oral Given 01/21/22 2258)  traMADol (ULTRAM) tablet 50 mg (has no administration in time range)  LORazepam (ATIVAN) tablet 0.5 mg (0.5 mg Oral Given 01/21/22 2258)  mirtazapine (REMERON) tablet 7.5 mg (7.5 mg Oral Given 01/21/22 2312)  dexamethasone (DECADRON) tablet 4 mg (4 mg Oral Patient Refused/Not Given 01/21/22 2304)  ondansetron (ZOFRAN) tablet 4 mg (has no administration in time range)  sucralfate (CARAFATE) 1 GM/10ML suspension 1 g (1 g Oral Patient Refused/Not  Given 01/21/22 2304)  estradiol (ESTRACE) vaginal cream 1 Applicatorful (has no administration in time range)  gabapentin (NEURONTIN) capsule 100 mg (100 mg Oral Not Given 01/21/22 2303)  ipratropium-albuterol (DUONEB) 0.5-2.5 (3) MG/3ML nebulizer solution 3 mL (has no administration in time range)  cefTRIAXone (ROCEPHIN) 2 g in sodium chloride 0.9 % 100 mL IVPB (has no administration in time range)  azithromycin (ZITHROMAX) 500 mg in sodium chloride 0.9 % 250 mL IVPB ( Intravenous Restarted 01/21/22 2147)  enoxaparin (LOVENOX) injection 40 mg (40 mg Subcutaneous Given 01/21/22 2312)  sodium chloride (OCEAN) 0.65 % nasal spray 2 spray (has no administration in time range)  sodium chloride 0.9 % bolus 500 mL (has no administration in time range)  0.9 %  sodium chloride infusion (has no administration in time range)  iohexol (OMNIPAQUE) 350 MG/ML injection 75 mL (75 mLs Intravenous Contrast Given 01/21/22 1814)  cefTRIAXone (ROCEPHIN) 1 g in sodium chloride 0.9 % 100 mL IVPB (0 g Intravenous Stopped 01/21/22 2005)  azithromycin (ZITHROMAX) 500 mg in sodium chloride 0.9 % 250 mL IVPB (500 mg Intravenous New Bag/Given 01/21/22 2021)     IMPRESSION / MDM / ASSESSMENT AND PLAN / ED COURSE  I reviewed the triage vital signs and the nursing notes. ----------------------------------------- 6:09 PM on 01/21/2022 ----------------------------------------- I am suspicious in this patient with low-grade fever tachypnea and tachycardia and nonproductive cough and cancer that she might have a PE.  I did a D-dimer which was quite elevated at 9.93.  White blood count is within normal range at 9.8 differential does show increased amount of neutrophils at 93% however.  Procalcitonin 0.12 which the algorithm suggests do not initiate antibiotics.  I will get a CT scan to further evaluate her chest. Differential diagnosis includes, but is not limited to, worsening cancer, pneumonia either viral or bacterial or  atypical or PE. ----------------------------------------- 7:12 PM on 01/21/2022 ----------------------------------------- CT returns with much worse metastatic cancer possible pneumonia but no PE.  Patient is very dyspneic when she walks and then gets some chest pain.  We are currently getting an EKG.  Pain goes away rapidly when she sits.  Give her some antibiotics because of the CT findings. Patient's presentation is most consistent with acute presentation with potential threat to life or bodily function.  The patient is on the cardiac monitor to evaluate for evidence of arrhythmia and/or significant heart rate changes.  This tachycardia has been seen    FINAL CLINICAL IMPRESSION(S) / ED DIAGNOSES   Final  diagnoses:  Dyspnea, unspecified type  Community acquired pneumonia, unspecified laterality   A further diagnosis is progression of metastatic breast cancer in the lungs.  There is likely is a large component of the patient's dyspnea.  Patient also has an effusion which is likely due to the cancer.  Rx / DC Orders   ED Discharge Orders     None        Note:  This document was prepared using Dragon voice recognition software and may include unintentional dictation errors.   Nena Polio, MD 01/21/22 2325

## 2022-01-21 NOTE — ED Notes (Signed)
Attempted report x3 to [phone]. ED Charge RN Annie Main informed of attempts at care handoff without success.

## 2022-01-21 NOTE — Assessment & Plan Note (Signed)
Rocephin and Zithromax ordered from the ER.  Ordered blood cultures.  Continue to monitor closely.  Patient is tachycardic and tachypneic which would be criteria for sepsis.

## 2022-01-21 NOTE — Assessment & Plan Note (Signed)
Can consider thoracentesis if respiratory status worsens.

## 2022-01-21 NOTE — Assessment & Plan Note (Signed)
Present on admission with tachycardia and tachypnea and multifocal pneumonia.  Continue antibiotics and follow-up blood cultures.
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
## 2022-01-21 NOTE — ED Notes (Signed)
Pt requested to speak with admitting provider DR Leslye Peer regarding removing DNR code status to full.  Per MD phone call, code status updated to full code. Pt updated regarding changed. Daughter and S/O at bedside.

## 2022-01-22 ENCOUNTER — Other Ambulatory Visit: Payer: Self-pay | Admitting: Oncology

## 2022-01-22 ENCOUNTER — Inpatient Hospital Stay: Payer: 59

## 2022-01-22 ENCOUNTER — Ambulatory Visit: Payer: 59

## 2022-01-22 DIAGNOSIS — Z170 Estrogen receptor positive status [ER+]: Secondary | ICD-10-CM

## 2022-01-22 DIAGNOSIS — J189 Pneumonia, unspecified organism: Secondary | ICD-10-CM | POA: Diagnosis not present

## 2022-01-22 DIAGNOSIS — Z515 Encounter for palliative care: Secondary | ICD-10-CM | POA: Diagnosis not present

## 2022-01-22 LAB — BASIC METABOLIC PANEL
Anion gap: 6 (ref 5–15)
BUN: 14 mg/dL (ref 8–23)
CO2: 26 mmol/L (ref 22–32)
Calcium: 8.2 mg/dL — ABNORMAL LOW (ref 8.9–10.3)
Chloride: 106 mmol/L (ref 98–111)
Creatinine, Ser: 0.59 mg/dL (ref 0.44–1.00)
GFR, Estimated: >60 mL/min (ref >60)
Glucose, Bld: 112 mg/dL — ABNORMAL HIGH (ref 70–99)
Potassium: 3.1 mmol/L — ABNORMAL LOW (ref 3.5–5.1)
Sodium: 138 mmol/L (ref 135–145)

## 2022-01-22 LAB — CBC
HCT: 31.8 % — ABNORMAL LOW (ref 36.0–46.0)
Hemoglobin: 10.5 g/dL — ABNORMAL LOW (ref 12.0–15.0)
MCH: 32 pg (ref 26.0–34.0)
MCHC: 33 g/dL (ref 30.0–36.0)
MCV: 97 fL (ref 80.0–100.0)
Platelets: 187 10*3/uL (ref 150–400)
RBC: 3.28 MIL/uL — ABNORMAL LOW (ref 3.87–5.11)
RDW: 15.6 % — ABNORMAL HIGH (ref 11.5–15.5)
WBC: 7.9 10*3/uL (ref 4.0–10.5)
nRBC: 0 % (ref 0.0–0.2)

## 2022-01-22 LAB — BRAIN NATRIURETIC PEPTIDE: B Natriuretic Peptide: 22.3 pg/mL (ref 0.0–100.0)

## 2022-01-22 LAB — HIV ANTIBODY (ROUTINE TESTING W REFLEX): HIV Screen 4th Generation wRfx: NONREACTIVE

## 2022-01-22 LAB — LACTIC ACID, PLASMA: Lactic Acid, Venous: 0.8 mmol/L (ref 0.5–1.9)

## 2022-01-22 MED ORDER — ONDANSETRON 4 MG PO TBDP
4.0000 mg | ORAL_TABLET | Freq: Three times a day (TID) | ORAL | Status: DC | PRN
  Administered 2022-01-22 – 2022-01-24 (×4): 4 mg via ORAL
  Filled 2022-01-22 (×4): qty 1

## 2022-01-22 MED ORDER — POTASSIUM CHLORIDE 20 MEQ PO PACK
40.0000 meq | PACK | Freq: Once | ORAL | Status: AC
  Administered 2022-01-22: 40 meq via ORAL
  Filled 2022-01-22: qty 2

## 2022-01-22 MED ORDER — LORATADINE 10 MG PO TABS
5.0000 mg | ORAL_TABLET | Freq: Every day | ORAL | Status: DC
  Administered 2022-01-22 – 2022-01-25 (×4): 5 mg via ORAL
  Filled 2022-01-22 (×4): qty 1

## 2022-01-22 MED ORDER — DEXAMETHASONE 4 MG PO TABS: 2.0000 mg | ORAL_TABLET | Freq: Two times a day (BID) | ORAL | Status: DC

## 2022-01-22 MED ORDER — DEXAMETHASONE SODIUM PHOSPHATE 4 MG/ML IJ SOLN
4.0000 mg | Freq: Two times a day (BID) | INTRAMUSCULAR | Status: DC
  Administered 2022-01-22: 4 mg via INTRAVENOUS
  Filled 2022-01-22: qty 1

## 2022-01-22 MED ORDER — ACETAMINOPHEN 160 MG/5ML PO SOLN
1000.0000 mg | ORAL | Status: DC | PRN
  Administered 2022-01-22 – 2022-01-24 (×2): 1000 mg via ORAL
  Filled 2022-01-22 (×3): qty 40.6

## 2022-01-22 MED ORDER — IPRATROPIUM-ALBUTEROL 0.5-2.5 (3) MG/3ML IN SOLN
3.0000 mL | Freq: Three times a day (TID) | RESPIRATORY_TRACT | Status: DC
  Administered 2022-01-22 – 2022-01-23 (×2): 3 mL via RESPIRATORY_TRACT
  Filled 2022-01-22 (×2): qty 3

## 2022-01-22 MED ORDER — BENZONATATE 100 MG PO CAPS
100.0000 mg | ORAL_CAPSULE | Freq: Three times a day (TID) | ORAL | Status: DC
  Administered 2022-01-22 (×3): 100 mg via ORAL
  Filled 2022-01-22 (×4): qty 1

## 2022-01-22 MED ORDER — SODIUM CHLORIDE 0.9 % IV BOLUS (SEPSIS)
500.0000 mL | Freq: Once | INTRAVENOUS | Status: AC
  Administered 2022-01-22: 500 mL via INTRAVENOUS

## 2022-01-22 MED ORDER — IPRATROPIUM-ALBUTEROL 0.5-2.5 (3) MG/3ML IN SOLN
3.0000 mL | Freq: Four times a day (QID) | RESPIRATORY_TRACT | Status: DC
  Administered 2022-01-22: 3 mL via RESPIRATORY_TRACT
  Filled 2022-01-22: qty 3

## 2022-01-22 MED ORDER — ALBUTEROL SULFATE (2.5 MG/3ML) 0.083% IN NEBU
2.5000 mg | INHALATION_SOLUTION | RESPIRATORY_TRACT | Status: DC | PRN
  Administered 2022-01-22: 2.5 mg via RESPIRATORY_TRACT
  Filled 2022-01-22: qty 3

## 2022-01-22 MED ORDER — SALINE SPRAY 0.65 % NA SOLN: 1.0000 | NASAL | Status: DC | PRN

## 2022-01-22 MED ORDER — IBUPROFEN 100 MG/5ML PO SUSP
400.0000 mg | Freq: Four times a day (QID) | ORAL | Status: DC | PRN
  Administered 2022-01-23 – 2022-01-25 (×6): 400 mg via ORAL
  Filled 2022-01-22 (×9): qty 20

## 2022-01-22 MED ORDER — FLUTICASONE PROPIONATE 50 MCG/ACT NA SUSP
2.0000 | Freq: Every day | NASAL | Status: DC
  Administered 2022-01-22 – 2022-01-25 (×4): 2 via NASAL
  Filled 2022-01-22: qty 16

## 2022-01-22 NOTE — Progress Notes (Signed)
Temp decreased to 99.4 heart rate 107

## 2022-01-22 NOTE — Progress Notes (Signed)
Pt received 2 ns bolus of 500 ml each, heart rate decreased into the 1 teens but heart rate is now sustained in the 120"s . she also is on 2 lpm of 02 chronic . When she was lying on her left side 02 sats decrease to 80% . Writer applied a non rebreather mask for about an hour  due to sats not increasing and increased her o2 to 4 liters and sats  increased to 95 %. I have since reapplied her [place] at 4lpm sats 92% her temp is currently 100.9 . Lungs sound diminished , no wheezing noted and denies SOB . I've contacted `pharmacy to have the tylenol switched over to liquid , medication given. On call provider made aware. No new orders , will continue to monitor

## 2022-01-22 NOTE — Progress Notes (Signed)
PT Cancellation Note  Patient Details Name: Shemaiah Round MRN: [id number] DOB: [birth year]   Cancelled Treatment:    Reason Eval/Treat Not Completed: Medical issues which prohibited therapy (Consult received and chart reviewed. Patient currently pending LE dopplers to rule out DVT.  Will hold until imaging complete, results received and patient cleared for activity.  Will continue to follow and initiate as appropriate.)   Eder Macek H. Owens Shark, PT, DPT, NCS 01/22/22, 10:26 AM [phone]

## 2022-01-22 NOTE — Progress Notes (Signed)
[facility] [place] NAME: Rudy Domek    MR#:  [id number]  [place]:  [birth year]  SUBJECTIVE:  Family at bedside patient came in after she was evaluated at the cancer center for increasing shortness of breath. Jesus about 2 L nasal cannula oxygen. She is currently getting radiation therapy for neck/mediastinal lymphadenopathy for metastatic breast cancer. She is supposed to be started on chemotherapy as well. 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 Has some dry hacking cough. No chest pain.  VITALS:  Blood pressure 103/64, pulse 100, temperature 98.7 F (37.1 C), resp. rate 19, height 4\' 11"  (1.499 m), weight 58.5 kg, last menstrual period 06/15/2011, SpO2 95 %.  PHYSICAL EXAMINATION:   GENERAL:  61 y.o.-year-old patient lying in the bed with no acute distress.  LUNGS: Normal breath sounds bilaterally, occ inspiratory wheezing CARDIOVASCULAR: S1, S2 normal. No murmurs,   ABDOMEN: Soft, nontender, nondistended. Bowel sounds present.  EXTREMITIES: No  edema b/l.    NEUROLOGIC: nonfocal  patient is alert and awake SKIN: No obvious rash, lesion, or ulcer.   LABORATORY PANEL:  CBC Recent Labs  Lab 01/22/22 0400  WBC 7.9  HGB 10.5*  HCT 31.8*  PLT 187    Chemistries  Recent Labs  Lab 01/21/22 1534 01/22/22 0400  NA 137 138  K 3.8 3.1*  CL 102 106  CO2 26 26  GLUCOSE 123* 112*  BUN 19 14  CREATININE 0.69 0.59  CALCIUM 9.2 8.2*  AST 19  --   ALT 21  --   ALKPHOS 90  --   BILITOT 0.7  --    Cardiac Enzymes No results for input(s): "TROPONINI" in the last 168 hours. RADIOLOGY:  US Venous Img Lower Bilateral (DVT)  Result Date: 01/22/2022 CLINICAL DATA:  Metastatic breast cancer, dyspnea, chest pain, elevated D-dimer EXAM: BILATERAL LOWER EXTREMITY VENOUS DOPPLER ULTRASOUND TECHNIQUE:  Gray-scale sonography with compression, as well as color and duplex ultrasound, were performed to evaluate the deep venous system(s) from the level of the common femoral vein through the popliteal and proximal calf veins. COMPARISON:  None Available. FINDINGS: VENOUS Normal compressibility of the common femoral, superficial femoral, and popliteal veins, as well as the visualized calf veins. Visualized portions of profunda femoral vein and great saphenous vein unremarkable. No filling defects to suggest DVT on grayscale or color Doppler imaging. Doppler waveforms show normal direction of venous flow, normal respiratory plasticity and response to augmentation. OTHER None. Limitations: none IMPRESSION: 1. Negative. Electronically Signed   By: Fidela Salisbury M.D.   On: 01/22/2022 10:00   CT Angio Chest PE W and/or Wo Contrast  Result Date: 01/21/2022 CLINICAL DATA:  Bilateral breast cancer, shortness of breath EXAM: CT ANGIOGRAPHY CHEST WITH CONTRAST TECHNIQUE: Multidetector CT imaging of the chest was performed using the standard protocol during bolus administration of intravenous contrast. Multiplanar CT image reconstructions and MIPs were obtained to evaluate the vascular anatomy. RADIATION DOSE REDUCTION: This exam was performed according to the departmental dose-optimization program which includes automated exposure control, adjustment of the mA and/or kV according to patient size and/or use of iterative reconstruction technique. CONTRAST:  75 mL OMNIPAQUE IOHEXOL 350 MG/ML SOLN COMPARISON:  11/27/2021 FINDINGS: Cardiovascular: Satisfactory opacification of the pulmonary arteries to the segmental level. No evidence of pulmonary embolism. Mild cardiomegaly. Small pericardial effusion, about 9 mm in thickness.  Heart size. No pericardial effusion. Mediastinum/Nodes: Extensive suspicious mediastinal, hilar, axillary and subdiaphragmatic adenopathy is seen which represents progression. The largest hilar node is on  the right measuring 3.3 cm. Mediastinal adenopathy is confluent. Supraclavicular masses on the left up to 2.2 cm consistent with metastasis. There is a left axillary mass that measures 3.6 cm as well as numerous additional suspicious left axillary nodes. Lungs/Pleura: There is extensive bibasilar consolidation with moderate bilateral pleural effusions and numerous pulmonary nodules and masses which represents significant interval progression of disease. The largest discrete lung mass on the right in the lower lobe is 4.5 cm. Right upper lobe area of consolidation in the and mass is about 5 cm. There are innumerable additional nodules throughout both lungs are of varying sizes. Upper Abdomen: Imaged portions of the upper abdominal organs are unremarkable. Musculoskeletal: Thoracic degenerative changes noted. Left-sided Port-A-Cath tip is in the distal SVC. Review of the MIP images confirms the above findings. IMPRESSION: 1. No evidence of pulmonary embolism, aortic aneurysm or dissection. 2. Marked progression of disease with increase in size and number of lung nodules as well as extensive mediastinal, hilar, subdiaphragmatic, supraclavicular and left axillary adenopathy. 3. Bilateral pleural effusions. 4. Areas of alveolar pulmonary parenchymal consolidation could represent superimposed pneumonia. Electronically Signed   By: Sammie Bench M.D.   On: 01/21/2022 18:36   DG Chest 2 View  Result Date: 01/21/2022 CLINICAL DATA:  Shortness of breath. History of metastatic breast cancer. EXAM: CHEST - 2 VIEW COMPARISON:  Chest x-ray dated January 12, 2022. FINDINGS: Unchanged left chest wall port catheter. The heart size and mediastinal contours are within normal limits. Increased small bilateral pleural effusions with worsening aeration at both lung bases. Multiple pulmonary nodules in both lungs are not significantly changed. No pneumothorax. No acute osseous abnormality. IMPRESSION: 1. Increased small bilateral  pleural effusions with worsening bibasilar atelectasis and/or pneumonia. 2. Unchanged pulmonary metastatic disease. Electronically Signed   By: Titus Dubin M.D.   On: 01/21/2022 13:34    Assessment and Plan Simisola Danaka Llera is a 61 y.o. female with medical history significant of metastatic breast cancer to bone, lungs, lung pleura and lymph nodes.  Also history of asthma and pains in her arms.  Patient started having worsening shortness of breath since this morning a dry cough.  She went to see her oncologist and was sent into the emergency room.   In the emergency room she had a elevated D-dimer and a CT scan of the chest was ordered that showed no pulmonary embolism did show pneumonia and increased size and number of nodules in the lungs.  Extensive lymph nodes in the axilla, hilum, supraclavicular, mediastinal and subdiaphragmatic and bilateral pleural effusions.  Areas of alveolar pulmonary parenchymal consolidation could represent superimposed pneumonia.     Multifocal pneumonia --Cont IV Rocephin and Zithromax  --low grade temp 100.7 at 3 am --tessalon perrles for dry cough --cont Chronic home oxygen  --blood cultures negative  -- Patient is tachycardic and tachypneic which would be criteria for sepsis.   Sepsis ([place]) Present on admission with tachycardia and tachypnea and multifocal pneumonia.  Continue antibiotics and follow-up blood cultures.   Invasive carcinoma of breast ([place]) Metastatic to bone, lungs, lung pleura and lymph nodes.  Oncology consultation with [name] Pt undergoing XRT at present. Josh Palliative NP to see pt   Chronic bilateral pleural effusions Can consider thoracentesis if respiratory status worsens.   Chronic respiratory failure with hypoxia ([facility]) Started on oxygen around 3  to 4 weeks ago on 2 L of oxygen.   Elevated d-dimer CT scan negative for PE.   ultrasound of the lower extremity negative   GERD (gastroesophageal reflux disease) On Carafate    Mild intermittent asthma Prn nebs Completed 3 weeks of Decadron   Dietitian to see pt  Ambulatory in the room by self  Procedures: Family communication :daughter Consults :Oncology CODE STATUS: full DVT Prophylaxis :lovenox Level of care: Telemetry Medical Status is: Inpatient Remains inpatient appropriate because: sepsis, Pneumonia    TOTAL TIME TAKING CARE OF THIS PATIENT: 35 minutes.  >50% time spent on counselling and coordination of care  Note: This dictation was prepared with Dragon dictation along with smaller phrase technology. Any transcriptional errors that result from this process are unintentional.  Fritzi Mandes M.D    [facility]   CC: Primary care physician; Leone Haven, MD
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
## 2022-01-22 NOTE — TOC Initial Note (Signed)
Transition of Care [facility]) - Initial/Assessment Note    Patient Details  Name: Amanda Skinner MRN: [id number] Date of Birth: [birth year]  Transition of Care [facility]) CM/SW Contact:    Laurena Slimmer, RN Phone Number: 01/22/2022, 12:36 PM  Clinical Narrative:                  Transition of Care (TOC) Screening Note   Patient Details  Name: Amanda Skinner Date of Birth: Aug 04, 1960   Transition of Care [facility]) CM/SW Contact:    Laurena Slimmer, RN Phone Number: 01/22/2022, 12:36 PM    Transition of Care Department [facility]) has reviewed patient and no TOC needs have been identified at this time. We will continue to monitor patient advancement through interdisciplinary progression rounds. If new patient transition needs arise, please place a TOC consult.          Patient Goals and CMS Choice        Expected Discharge Plan and Services                                                Prior Living Arrangements/Services                       Activities of Daily Living Home Assistive Devices/Equipment: None ADL Screening (condition at time of admission) Patient's cognitive ability adequate to safely complete daily activities?: Yes Is the patient deaf or have difficulty hearing?: No Does the patient have difficulty seeing, even when wearing glasses/contacts?: No Does the patient have difficulty concentrating, remembering, or making decisions?: No Patient able to express need for assistance with ADLs?: No Does the patient have difficulty dressing or bathing?: No Independently performs ADLs?: Yes (appropriate for developmental age) Does the patient have difficulty walking or climbing stairs?: No Weakness of Legs: Both Weakness of Arms/Hands: Both  Permission Sought/Granted                  Emotional Assessment              Admission diagnosis:  Pneumonia [J18.9] Patient Active Problem List   Diagnosis Date Noted   Multifocal pneumonia  01/21/2022   Sepsis ([place]) 01/21/2022   Chronic respiratory failure with hypoxia ([place]) 01/21/2022   Chronic bilateral pleural effusions 01/21/2022   Elevated d-dimer 01/21/2022   Dysphagia 01/01/2022   Nausea with vomiting 01/01/2022   Genetic testing 12/12/2020   Mutation in [place] gene 12/12/2020   Family history of skin cancer 11/27/2020   Malignant neoplasm of upper-outer quadrant of right breast in female, estrogen receptor positive ([place]) 11/03/2020   Invasive carcinoma of breast ([place]) 10/27/2020   Goals of care, counseling/discussion 10/27/2020   History of UTI 10/23/2019   Left flank pain 10/23/2019   Aortic atherosclerosis ([place]) 10/23/2019   Bilateral flank pain 10/10/2019   Dysuria 10/10/2019   Generalized osteoarthritis of hand 10/20/2018   Greater trochanteric bursitis of right hip 10/03/2018   Joint pain 08/31/2018   TMJ (dislocation of temporomandibular joint) 08/31/2018   Endometrial polyp 06/15/2017   Postmenopause atrophic vaginitis 08/03/2016   Routine general medical examination at a health care facility 04/03/2016   GERD (gastroesophageal reflux disease) 01/01/2016   Fibromyalgia 06/05/2015   Morton's metatarsalgia, neuralgia, 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   Atypical chest pain 02/01/2015   Family history of coronary artery disease 02/01/2015  Musculoskeletal chest pain 01/29/2015   Mild intermittent asthma 10/09/2014   Allergic rhinitis 10/09/2014   Bilateral hand pain 10/08/2014   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   Diffuse cystic mastopathy 05/11/2012   PCP:  Leone Haven, MD Pharmacy:   [facility], [place] [address] [place] [address] Phone: [phone] Fax: [phone]  [place] [address] Phone: [phone] Fax: [phone]  [id number] [address] [id number]: [phone]: [phone]    Social Determinants of Health (SDOH) Interventions    Readmission Risk Interventions     No data to display

## 2022-01-22 NOTE — Evaluation (Signed)
Physical Therapy Evaluation Patient Details Name: Amanda Skinner MRN: [id number] DOB: 1960/11/03 Today's Date: 01/22/2022  History of Present Illness  Patient came in after she was evaluated at the cancer center for increasing shortness of breath.  Chronic 2 L nasal cannula oxygen. She is currently getting radiation therapy for neck/mediastinal lymphadenopathy for metastatic breast cancer. She is supposed to be started on chemotherapy as well.  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 Has some dry hacking cough. No chest pain.  Clinical Impression  Pt is a pleasant 61 year old female who was admitted for pneumonia. All mobility performed on 3L of O2 with SOB symptoms noted. Chronic home O2 at 2L. Pt very aware of limitations and would be appropriate for continued mobility with mobility team and/or nursing staff. Pt demonstrates all bed mobility/transfers/ambulation at baseline level. Pt does not require any further PT needs at this time. Pt will be dc in house and does not require follow up. RN aware. Will dc current orders.      Recommendations for follow up therapy are one component of a multi-disciplinary discharge planning process, led by the attending physician.  Recommendations may be updated based on patient status, additional functional criteria and insurance authorization.  Follow Up Recommendations No PT follow up      Assistance Recommended at Discharge None  Patient can return home with the following       Equipment Recommendations None recommended by PT  Recommendations for Other Services       Functional Status Assessment Patient has not had a recent decline in their functional status     Precautions / Restrictions Precautions Precautions: None Restrictions Weight Bearing Restrictions: No      Mobility  Bed Mobility Overal bed mobility:  Independent             General bed mobility comments: safe technique    Transfers Overall transfer level: Independent Equipment used: None               General transfer comment: safe technique with ease of mobility.    Ambulation/Gait Ambulation/Gait assistance: Independent Gait Distance (Feet): 150 Feet Assistive device: None Gait Pattern/deviations: WFL(Within Functional Limits)       General Gait Details: safe technique with ease of mobility. All mobility performed on 3L of O2 with sats maintaining at 96% with exertion and HR increasing to 117bpm. Pt preferred to do laps in room. [name] reaches thoughout room  Stairs            Wheelchair Mobility    Modified Rankin (Stroke Patients Only)       Balance Overall balance assessment: Independent                                           Pertinent Vitals/Pain Pain Assessment Pain Assessment: No/denies pain    Home Living Family/patient expects to be discharged to:: Private residence Living Arrangements: Spouse/significant other Available Help at Discharge: Family Type of Home: House Home Access: Stairs to enter Entrance Stairs-Rails: Can reach both Entrance Stairs-Number of Steps: 3   Home Layout: One level Home Equipment: None Additional Comments: currently lives in [name], however renting a home closer to hospital due to medical status. Home set up reflects current AirBnB.    Prior  Function Prior Level of Function : Independent/Modified Independent;Working/employed             Mobility Comments: indep. Was previously running 10 miles 4 years ago. Currently works as patient scheduling in same day Sx ADLs Comments: no issues     Hand Dominance        Extremity/Trunk Assessment   Upper Extremity Assessment Upper Extremity Assessment: Overall WFL for tasks assessed    Lower Extremity Assessment Lower Extremity Assessment: Overall WFL for tasks assessed        Communication   Communication: No difficulties  Cognition Arousal/Alertness: Awake/alert Behavior During Therapy: WFL for tasks assessed/performed Overall Cognitive Status: Within Functional Limits for tasks assessed                                 General Comments: pleasant and agreeable to session        General Comments      Exercises Other Exercises Other Exercises: ambulated to bathroom with independence. No issues with performing hygiene   Assessment/Plan    PT Assessment Patient does not need any further PT services  PT Problem List         PT Treatment Interventions      PT Goals (Current goals can be found in the Care Plan section)  Acute Rehab PT Goals Patient Stated Goal: to get her breathing easier PT Goal Formulation: All assessment and education complete, DC therapy Time For Goal Achievement: 01/22/22 Potential to Achieve Goals: Good    Frequency       Co-evaluation               AM-PAC PT "6 Clicks" Mobility  Outcome Measure Help needed turning from your back to your side while in a flat bed without using bedrails?: None Help needed moving from lying on your back to sitting on the side of a flat bed without using bedrails?: None Help needed moving to and from a bed to a chair (including a wheelchair)?: None Help needed standing up from a chair using your arms (e.g., wheelchair or bedside chair)?: None Help needed to walk in hospital room?: None Help needed climbing 3-5 steps with a railing? : None 6 Click Score: 24    End of Session   Activity Tolerance: Patient tolerated treatment well Patient left: in bed Nurse Communication: Mobility status PT Visit Diagnosis: Difficulty in walking, not elsewhere classified (R26.2)    Time: 6203-5597 PT Time Calculation (min) (ACUTE ONLY): 22 min   Charges:   PT Evaluation $PT Eval Low Complexity: 1 Low PT Treatments $Gait Training: 8-22 mins        Amanda Skinner, PT, DPT,  GCS [phone]   Amanda Skinner 01/22/2022, 4:22 PM

## 2022-01-22 NOTE — Progress Notes (Addendum)
Initial Nutrition Assessment  DOCUMENTATION CODES:   Not applicable  INTERVENTION:   -Magic cup TID with meals, each supplement provides 290 kcal and 9 grams of protein  -MVI with minerals daily -Continue with regular diet -Allow pt family to bring in outside foods to promote adequate oral intake -Notified RD at [facility] for continuity of care  NUTRITION DIAGNOSIS:   Increased nutrient needs related to cancer and cancer related treatments as evidenced by estimated needs.  GOAL:   Patient will meet greater than or equal to 90% of their needs  MONITOR:   PO intake, Supplement acceptance  REASON FOR ASSESSMENT:   Malnutrition Screening Tool, Consult Assessment of nutrition requirement/status  ASSESSMENT:   Pt with medical history significant of metastatic breast cancer to bone, lungs, lung pleura and lymph nodes.  Also history of asthma and pains in her arms.  Patient started having worsening shortness of breath since this morning a dry cough.  Pt admitted with multifocal pneumonia.   Case discussed with MD; pt with breast cancer with mets to bone, lungs, lung pleura, and lymph nodes. She is currently undergoing radiation and is having trouble swallowing due to side effects from radiation.   Spoke with pt and sister at bedside. Pt reports feeling better today. She reports good appetite, but explains that she often has throat pain secondary to to side effects from radiation. She reports that she has met with [place] RD, who has advised pt to consume high calorie fortified foods such as high fat cottage cheese and mashed potatoes with cream cheese, butter, and heavy cream. Per pt, her throat felt good yesterday, but more painful today. Softer foods tend to work well for pt. Pt has been consuming a lot of milkshakes from [employer] to help supplement her diet. She reports her husband will bring one in for her later.   Per pt, she had lost weight in the past, but has since  regained lost weight. Reviewed wt hx; pt has experienced a 5.6% wt loss over the past 2 months, which is not significant for time frame.   Discussed importance of good meal and supplement intake to promote healing. Offered to downgrade diet for ease of intake, but pt would like to continue with regular diet for wider variety of choices. Pt consumes Boost Very High Calorie (Vanilla) at home and reports her family will bring some in to drink. She refuses offer of nutritional supplements other than Magic Cups.    Medications reviewed and include remeron and 0.9% sodium chloride infusion @ 50 ml/hr.   Labs reviewed: K: 3.1.   NUTRITION - FOCUSED PHYSICAL EXAM:  Flowsheet Row Most Recent Value  Orbital Region No depletion  Upper Arm Region Mild depletion  Thoracic and Lumbar Region No depletion  Buccal Region No depletion  Temple Region No depletion  Clavicle Bone Region No depletion  Clavicle and Acromion Bone Region No depletion  Scapular Bone Region No depletion  Dorsal Hand No depletion  Patellar Region No depletion  Anterior Thigh Region No depletion  Posterior Calf Region No depletion  Edema (RD Assessment) None  Hair Reviewed  Eyes Reviewed  Mouth Reviewed  Skin Reviewed  Nails Reviewed       Diet Order:   Diet Order             Diet regular Room service appropriate? Yes; Fluid consistency: Thin  Diet effective now  EDUCATION NEEDS:   Education needs have been addressed  Skin:  Skin Assessment: Reviewed RN Assessment  Last BM:  01/20/22  Height:   Ht Readings from Last 1 Encounters:  01/21/22 _0  (1.499 m)    Weight:   Wt Readings from Last 1 Encounters:  01/21/22 58.5 kg    Ideal Body Weight:  44.6 kg  BMI:  Body mass index is 26.05 kg/m.  Estimated Nutritional Needs:   Kcal:  1550-1750  Protein:  80-95 grams  Fluid:  > 1.5 L    Amanda Skinner, RD, LDN, [place] Registered Dietitian II Certified Diabetes Care and  Education Specialist Please refer to [facility] for RD and/or RD on-call/weekend/after hours pager

## 2022-01-23 ENCOUNTER — Inpatient Hospital Stay: Payer: 59

## 2022-01-23 ENCOUNTER — Ambulatory Visit: Payer: 59

## 2022-01-23 DIAGNOSIS — J189 Pneumonia, unspecified organism: Secondary | ICD-10-CM | POA: Diagnosis not present

## 2022-01-23 LAB — BASIC METABOLIC PANEL
Anion gap: 8 (ref 5–15)
BUN: 9 mg/dL (ref 8–23)
CO2: 27 mmol/L (ref 22–32)
Calcium: 9.4 mg/dL (ref 8.9–10.3)
Chloride: 104 mmol/L (ref 98–111)
Creatinine, Ser: 0.58 mg/dL (ref 0.44–1.00)
GFR, Estimated: >60 mL/min (ref >60)
Glucose, Bld: 105 mg/dL — ABNORMAL HIGH (ref 70–99)
Potassium: 3.9 mmol/L (ref 3.5–5.1)
Sodium: 139 mmol/L (ref 135–145)

## 2022-01-23 LAB — MAGNESIUM: Magnesium: 1.6 mg/dL — ABNORMAL LOW (ref 1.7–2.4)

## 2022-01-23 MED ORDER — CHLORHEXIDINE GLUCONATE CLOTH 2 % EX PADS
6.0000 | MEDICATED_PAD | Freq: Every day | CUTANEOUS | Status: DC
  Administered 2022-01-23 – 2022-01-24 (×2): 6 via TOPICAL

## 2022-01-23 MED ORDER — MENTHOL 3 MG MT LOZG
1.0000 | LOZENGE | OROMUCOSAL | Status: DC | PRN
  Administered 2022-01-23: 3 mg via ORAL
  Filled 2022-01-23: qty 9

## 2022-01-23 MED ORDER — LIDOCAINE-PRILOCAINE 2.5-2.5 % EX CREA
TOPICAL_CREAM | Freq: Once | CUTANEOUS | Status: AC
  Filled 2022-01-23: qty 5

## 2022-01-23 MED ORDER — LEVALBUTEROL HCL 0.63 MG/3ML IN NEBU: 0.6300 mg | INHALATION_SOLUTION | Freq: Four times a day (QID) | RESPIRATORY_TRACT | Status: DC | PRN

## 2022-01-23 MED ORDER — MAGNESIUM SULFATE 2 GM/50ML IV SOLN
2.0000 g | Freq: Once | INTRAVENOUS | Status: AC
  Administered 2022-01-23: 2 g via INTRAVENOUS
  Filled 2022-01-23: qty 50

## 2022-01-23 MED ORDER — PHENOL 1.4 % MT LIQD
1.0000 | OROMUCOSAL | Status: DC | PRN
  Filled 2022-01-23: qty 177

## 2022-01-23 MED ORDER — LEVALBUTEROL HCL 0.63 MG/3ML IN NEBU
0.6300 mg | INHALATION_SOLUTION | Freq: Two times a day (BID) | RESPIRATORY_TRACT | Status: DC
  Administered 2022-01-23 – 2022-01-25 (×4): 0.63 mg via RESPIRATORY_TRACT
  Filled 2022-01-23 (×4): qty 3

## 2022-01-23 MED ORDER — LIDOCAINE HCL (PF) 1 % IJ SOLN
10.0000 mL | Freq: Once | INTRAMUSCULAR | Status: AC
  Administered 2022-01-23: 10 mL via INTRADERMAL

## 2022-01-23 MED ORDER — BENZONATATE 100 MG PO CAPS
100.0000 mg | ORAL_CAPSULE | Freq: Four times a day (QID) | ORAL | Status: DC
  Administered 2022-01-23 – 2022-01-25 (×9): 100 mg via ORAL
  Filled 2022-01-23 (×8): qty 1

## 2022-01-23 NOTE — Progress Notes (Signed)
[place] [facility] [place] NAME: Amanda Skinner    MR#:  [id number]  [place]:  1960/04/16  SUBJECTIVE:  Family at bedside patient came in after she was evaluated at the cancer center for increasing shortness of breath. At home on 2 L nasal cannula oxygen. She is currently getting radiation therapy for neck/mediastinal lymphadenopathy for metastatic breast cancer. She is supposed to be started on chemotherapy as well. Pt has been having intermittent issues with swallowing due to side effects of radiation she manages to work on diet/swallowing   Pt reports cough better with tessalon perrles. Eating ok. No fever VITALS:  Blood pressure 105/62, pulse (!) 118, temperature 97.7 F (36.5 C), temperature source Oral, resp. rate 18, height 4\' 11"  (1.499 m), weight 58.5 kg, last menstrual period 06/15/2011, SpO2 93 %.  PHYSICAL EXAMINATION:   GENERAL:  62 y.o.-year-old patient lying in the bed with no acute distress.  LUNGS: decreased breath sounds bilaterally, occ inspiratory wheezing CARDIOVASCULAR: S1, S2 normal. No murmurs,  tachy mild ABDOMEN: Soft, nontender, nondistended. Bowel sounds present.  EXTREMITIES: No  edema b/l.    NEUROLOGIC: nonfocal  patient is alert and awake SKIN: No obvious rash, lesion, or ulcer.   LABORATORY PANEL:  CBC Recent Labs  Lab 01/22/22 0400  WBC 7.9  HGB 10.5*  HCT 31.8*  PLT 187     Chemistries  Recent Labs  Lab 01/21/22 1534 01/22/22 0400 01/23/22 0628  NA 137   < > 139  K 3.8   < > 3.9  CL 102   < > 104  CO2 26   < > 27  GLUCOSE 123*   < > 105*  BUN 19   < > 9  CREATININE 0.69   < > 0.58  CALCIUM 9.2   < > 9.4  MG  --   --  1.6*  AST 19  --   --   ALT 21  --   --   ALKPHOS 90  --   --   BILITOT 0.7  --   --    < > = values in this interval not displayed.    Cardiac Enzymes No results for input(s): "TROPONINI" in the last 168 hours. RADIOLOGY:  US Venous Img Lower Bilateral  (DVT)  Result Date: 01/22/2022 CLINICAL DATA:  Metastatic breast cancer, dyspnea, chest pain, elevated D-dimer EXAM: BILATERAL LOWER EXTREMITY VENOUS DOPPLER ULTRASOUND TECHNIQUE: Gray-scale sonography with compression, as well as color and duplex ultrasound, were performed to evaluate the deep venous system(s) from the level of the common femoral vein through the popliteal and proximal calf veins. COMPARISON:  None Available. FINDINGS: VENOUS Normal compressibility of the common femoral, superficial femoral, and popliteal veins, as well as the visualized calf veins. Visualized portions of profunda femoral vein and great saphenous vein unremarkable. No filling defects to suggest DVT on grayscale or color Doppler imaging. Doppler waveforms show normal direction of venous flow, normal respiratory plasticity and response to augmentation. OTHER None. Limitations: none IMPRESSION: 1. Negative. Electronically Signed   By: Fidela Salisbury M.D.   On: 01/22/2022 10:00   CT Angio Chest PE W and/or Wo Contrast  Result Date: 01/21/2022 CLINICAL DATA:  Bilateral breast cancer, shortness of breath EXAM: CT ANGIOGRAPHY CHEST WITH CONTRAST TECHNIQUE: Multidetector CT imaging of the chest was performed using the standard protocol during bolus administration of intravenous contrast. Multiplanar CT image reconstructions and MIPs were obtained to evaluate the vascular anatomy. RADIATION  DOSE REDUCTION: This exam was performed according to the departmental dose-optimization program which includes automated exposure control, adjustment of the mA and/or kV according to patient size and/or use of iterative reconstruction technique. CONTRAST:  75 mL OMNIPAQUE IOHEXOL 350 MG/ML SOLN COMPARISON:  11/27/2021 FINDINGS: Cardiovascular: Satisfactory opacification of the pulmonary arteries to the segmental level. No evidence of pulmonary embolism. Mild cardiomegaly. Small pericardial effusion, about 9 mm in thickness. Heart size. No  pericardial effusion. Mediastinum/Nodes: Extensive suspicious mediastinal, hilar, axillary and subdiaphragmatic adenopathy is seen which represents progression. The largest hilar node is on the right measuring 3.3 cm. Mediastinal adenopathy is confluent. Supraclavicular masses on the left up to 2.2 cm consistent with metastasis. There is a left axillary mass that measures 3.6 cm as well as numerous additional suspicious left axillary nodes. Lungs/Pleura: There is extensive bibasilar consolidation with moderate bilateral pleural effusions and numerous pulmonary nodules and masses which represents significant interval progression of disease. The largest discrete lung mass on the right in the lower lobe is 4.5 cm. Right upper lobe area of consolidation in the and mass is about 5 cm. There are innumerable additional nodules throughout both lungs are of varying sizes. Upper Abdomen: Imaged portions of the upper abdominal organs are unremarkable. Musculoskeletal: Thoracic degenerative changes noted. Left-sided Port-A-Cath tip is in the distal SVC. Review of the MIP images confirms the above findings. IMPRESSION: 1. No evidence of pulmonary embolism, aortic aneurysm or dissection. 2. Marked progression of disease with increase in size and number of lung nodules as well as extensive mediastinal, hilar, subdiaphragmatic, supraclavicular and left axillary adenopathy. 3. Bilateral pleural effusions. 4. Areas of alveolar pulmonary parenchymal consolidation could represent superimposed pneumonia. Electronically Signed   By: Sammie Bench M.D.   On: 01/21/2022 18:36   DG Chest 2 View  Result Date: 01/21/2022 CLINICAL DATA:  Shortness of breath. History of metastatic breast cancer. EXAM: CHEST - 2 VIEW COMPARISON:  Chest x-ray dated January 12, 2022. FINDINGS: Unchanged left chest wall port catheter. The heart size and mediastinal contours are within normal limits. Increased small bilateral pleural effusions with  worsening aeration at both lung bases. Multiple pulmonary nodules in both lungs are not significantly changed. No pneumothorax. No acute osseous abnormality. IMPRESSION: 1. Increased small bilateral pleural effusions with worsening bibasilar atelectasis and/or pneumonia. 2. Unchanged pulmonary metastatic disease. Electronically Signed   By: Titus Dubin M.D.   On: 01/21/2022 13:34    Assessment and Plan Forrest Dianelys Skinner is a 61 y.o. female with medical history significant of metastatic breast cancer to bone, lungs, lung pleura and lymph nodes.  Also history of asthma and pains in her arms.  Patient started having worsening shortness of breath since this morning a dry cough.  She went to see her oncologist and was sent into the emergency room.   In the emergency room she had a elevated D-dimer and a CT scan of the chest was ordered that showed no pulmonary embolism did show pneumonia and increased size and number of nodules in the lungs.  Extensive lymph nodes in the axilla, hilum, supraclavicular, mediastinal and subdiaphragmatic and bilateral pleural effusions.  Areas of alveolar pulmonary parenchymal consolidation could represent superimposed pneumonia.     Multifocal pneumonia --Cont IV Rocephin and Zithromax  --low grade temp 100.7 at 3 am (11/30)--afebrile today --tessalon perrles for dry cough --cont Chronic home oxygen  --blood cultures negative  -- Patient is tachycardic and tachypneic which would be criteria for sepsis.   Sepsis ([place]) Present on  admission with tachycardia and tachypnea and multifocal pneumonia. --  Continue antibiotics   Invasive carcinoma of breast ([place]) Metastatic to bone, lungs, lung pleura and lymph nodes.   Oncology consultation with [name] Pt undergoing XRT at present.   Chronic bilateral pleural effusions --Attempt Left sided thoracentesis --pt aware   Chronic respiratory failure with hypoxia ([facility]) --Started on oxygen around 3 to 4 weeks ago on 2 L of  oxygen.   Elevated d-dimer CT scan negative for PE.   ultrasound of the lower extremity negative   GERD (gastroesophageal reflux disease) ---On Carafate   Mild intermittent asthma -Prn nebs --Completed 3 weeks of Decadron   Dietitian input noted  Ambulatory in the room by self. Seen by PT Appreciate Palliative care input  Procedures: Family communication : sister at bedside today Consults :Oncology CODE STATUS: full DVT Prophylaxis :lovenox Level of care: Telemetry Medical Status is: Inpatient Remains inpatient appropriate because: sepsis, Pneumonia    TOTAL TIME TAKING CARE OF THIS PATIENT: 35 minutes.  >50% time spent on counselling and coordination of care  Note: This dictation was prepared with Dragon dictation along with smaller phrase technology. Any transcriptional errors that result from this process are unintentional.  Fritzi Mandes M.D    [facility]   CC: Primary care physician; Leone Haven, MD

## 2022-01-23 NOTE — Consult Note (Signed)
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 radiograph. Electronically Signed   By: Keith Rake M.D.   On: 01/01/2022 17:20    Assessment and plan- Patient is a 61 y.o. female with metastatic triple negative breast cancer with lung and lymph node metastases admitted for acute on chronic hypoxic respiratory failure  Acute on chronic hypoxic respiratory failure: Multifactorial secondary to marked disease progression as well as superimposed pneumonia and malignant pleural effusion.  I discussed her case with Dr. Donella Stade and I recommend stopping palliative radiation at this time given that we are not able to pursue systemic treatment while radiation is going on.  She was supposed to have finished radiation on 01/28/2022.  She is able to swallow better after starting palliative radiation.  Unfortunately her disease is fairly aggressive and overall prognosis is  poor.  Patient does want to proceed with palliative chemotherapy and see if it will work in reducing her disease burden.  I have  therefore schedule her to start Trodelvy 2 weeks on and 1 week off starting 01/28/2022 as an outpatient.  Hopefully treatment of her underlying pneumonia as well as another therapeutic thoracentesis would help alleviate her dyspnea to some extent.  Recommend continuing IV antibiotics for at least 3 days in total before switching her to oral antibiotics since patient has difficulty swallowing and may not be able to swallow antibiotics upon discharge consistently   Thank you for this kind referral and the opportunity to participate in the care of this  Patient   Visit Diagnosis 1. Dyspnea, unspecified type   2. Community acquired pneumonia, unspecified laterality     Dr. Randa Evens, MD, MPH [facility] at [facility] [phone] 01/23/2022

## 2022-01-23 NOTE — Procedures (Signed)
PROCEDURE SUMMARY:  Successful US guided left thoracentesis. Yielded 550 ml of clear yellow fluid. Pt tolerated procedure well. No immediate complications.  Specimen not sent for labs. CXR ordered; no post-procedure pneumothorax identified  EBL < 2 mL  Theresa Duty, NP 01/23/2022 2:58 PM

## 2022-01-23 NOTE — Progress Notes (Addendum)
Mobility Specialist - Progress Note   Pre-mobility:  SpO2 (95) During mobility: SpO2 (93) Post-mobility: HR, BP, SPO2     01/23/22 1004  Mobility  Activity Ambulated with assistance in hallway;Stood at bedside  Level of Assistance Standby assist, set-up cues, supervision of patient - no hands on  Assistive Device None  Distance Ambulated (ft) 160 ft  Activity Response Tolerated well  Mobility Referral Yes  $Mobility charge 1 Mobility   Pt resting in bed on 3L upon entry. Pt STS and ambulates SBA in hallway. Pt expressed SOB and grab railing suddenly and took an immediate seated rest break for 2-3 minutes. SpO2 level remained steady. Pt wheeled back to room and returned to bed; Pt left with needs in reach. RN notified and family member present in room.

## 2022-01-24 DIAGNOSIS — J189 Pneumonia, unspecified organism: Secondary | ICD-10-CM | POA: Diagnosis not present

## 2022-01-24 LAB — BASIC METABOLIC PANEL
Anion gap: 8 (ref 5–15)
BUN: 10 mg/dL (ref 8–23)
CO2: 27 mmol/L (ref 22–32)
Calcium: 9.1 mg/dL (ref 8.9–10.3)
Chloride: 101 mmol/L (ref 98–111)
Creatinine, Ser: 0.6 mg/dL (ref 0.44–1.00)
GFR, Estimated: >60 mL/min (ref >60)
Glucose, Bld: 132 mg/dL — ABNORMAL HIGH (ref 70–99)
Potassium: 3.8 mmol/L (ref 3.5–5.1)
Sodium: 136 mmol/L (ref 135–145)

## 2022-01-24 LAB — MAGNESIUM: Magnesium: 1.7 mg/dL (ref 1.7–2.4)

## 2022-01-24 MED ORDER — MAGNESIUM SULFATE 2 GM/50ML IV SOLN
2.0000 g | Freq: Once | INTRAVENOUS | Status: AC
  Administered 2022-01-24: 2 g via INTRAVENOUS
  Filled 2022-01-24: qty 50

## 2022-01-24 NOTE — Progress Notes (Signed)
[facility] [place] NAME: Amanda Skinner    MR#:  [id number]  [place]:  12/18/1960  SUBJECTIVE:  daughter at bedside patient came in after she was evaluated at the cancer center for increasing shortness of breath. At home on 2 L nasal cannula oxygen. She is currently getting radiation therapy for neck/mediastinal lymphadenopathy for metastatic breast cancer. She is supposed to be started on chemotherapy as well. Pt has been having intermittent issues with swallowing due to side effects of radiation she manages to work on diet/swallowing   Pt reports overall improving slowly. Concern about keeping po abx at home. Discussed she will have only a couple days left before she goes. We can try liquid abx for home VITALS:  Blood pressure 101/67, pulse (!) 114, temperature 98.4 F (36.9 C), resp. rate 15, height 4\' 11"  (1.499 m), weight 58.5 kg, last menstrual period 06/15/2011, SpO2 92 %.  PHYSICAL EXAMINATION:   GENERAL:  61 y.o.-year-old patient lying in the bed with no acute distress.  LUNGS: decreased breath sounds bilaterally, occ inspiratory wheezing CARDIOVASCULAR: S1, S2 normal. No murmurs,  tachy mild ABDOMEN: Soft, nontender, nondistended. Bowel sounds present.  EXTREMITIES: No  edema b/l.    NEUROLOGIC: nonfocal  patient is alert and awake SKIN: No obvious rash, lesion, or ulcer.   LABORATORY PANEL:  CBC Recent Labs  Lab 01/22/22 0400  WBC 7.9  HGB 10.5*  HCT 31.8*  PLT 187     Chemistries  Recent Labs  Lab 01/21/22 1534 01/22/22 0400 01/24/22 0640  NA 137   < > 136  K 3.8   < > 3.8  CL 102   < > 101  CO2 26   < > 27  GLUCOSE 123*   < > 132*  BUN 19   < > 10  CREATININE 0.69   < > 0.60  CALCIUM 9.2   < > 9.1  MG  --    < > 1.7  AST 19  --   --   ALT 21  --   --   ALKPHOS 90  --   --   BILITOT 0.7  --   --    < > = values in this interval not displayed.    Cardiac Enzymes No results for input(s):  "TROPONINI" in the last 168 hours. RADIOLOGY:  US THORACENTESIS ASP PLEURAL SPACE W/IMG GUIDE  Result Date: 01/23/2022 INDICATION: Patient with a history of breast cancer and recurrent pleural effusions. Interventional radiology asked to perform a therapeutic thoracentesis. EXAM: ULTRASOUND GUIDED THORACENTESIS MEDICATIONS: 1% lidocaine 10 mL COMPLICATIONS: None immediate. PROCEDURE: An ultrasound guided thoracentesis was thoroughly discussed with the patient and questions answered. The benefits, risks, alternatives and complications were also discussed. The patient understands and wishes to proceed with the procedure. Written consent was obtained. Ultrasound was performed to localize and mark an adequate pocket of fluid in the left chest. The area was then prepped and draped in the normal sterile fashion. 1% Lidocaine was used for local anesthesia. Under ultrasound guidance a 6 Fr Safe-T-Centesis catheter was introduced. Thoracentesis was performed. The catheter was removed and a dressing applied. FINDINGS: A total of approximately 550 mL of clear yellow fluid was removed. IMPRESSION: Successful ultrasound guided left thoracentesis yielding 550 mL of pleural fluid. Read by: Soyla Dryer, NP Electronically Signed   By: Albin Felling M.D.   On: 01/23/2022 15:03   DG Chest Port 1 View  Result Date:  01/23/2022 CLINICAL DATA:  Status post thoracentesis. EXAM: PORTABLE CHEST 1 VIEW COMPARISON:  Radiographs 01/21/2022 and 01/12/2022.  CT 01/21/2022. FINDINGS: 1435 hours. The patient remains mildly rotated to the right. Left IJ central venous catheter appears unchanged at the level of the lower SVC. The heart size and mediastinal contours are stable. Left pleural effusion has decreased in volume following thoracentesis. No evidence of pneumothorax. Right pleural effusion and bibasilar airspace opacities have not significantly changed. The bones appear unremarkable. IMPRESSION: Decreased left pleural effusion  following thoracentesis. No evidence of pneumothorax. No significant change in right pleural effusion and bibasilar airspace opacities. Electronically Signed   By: Richardean Sale M.D.   On: 01/23/2022 14:57    Assessment and Plan Amanda Skinner is a 61 y.o. female with medical history significant of metastatic breast cancer to bone, lungs, lung pleura and lymph nodes.  Also history of asthma and pains in her arms.  Patient started having worsening shortness of breath since this morning a dry cough.  She went to see her oncologist and was sent into the emergency room.   In the emergency room she had a elevated D-dimer and a CT scan of the chest was ordered that showed no pulmonary embolism did show pneumonia and increased size and number of nodules in the lungs.  Extensive lymph nodes in the axilla, hilum, supraclavicular, mediastinal and subdiaphragmatic and bilateral pleural effusions.  Areas of alveolar pulmonary parenchymal consolidation could represent superimposed pneumonia.     Multifocal pneumonia --Cont IV Rocephin and Zithromax  --low grade temp 100.7 at 3 am (11/30)--afebrile today --tessalon perrles for dry cough --cont Chronic home oxygen  --blood cultures negative  -- Patient is tachycardic and tachypneic which would be criteria for sepsis.   Sepsis ([place]) Present on admission with tachycardia and tachypnea and multifocal pneumonia. --  Continue antibiotics --improving sepsis   Invasive carcinoma of breast ([place]) Metastatic to bone, lungs, lung pleura and lymph nodes.   Oncology consultation with [name] Pt undergoing XRT at present.   Chronic bilateral pleural effusions --s/p Left sided thoracentesis 12/1--550 cc removed   Chronic respiratory failure with hypoxia ([place]) --Started on oxygen around 3 to 4 weeks ago on 2 L of oxygen.   Elevated d-dimer CT scan negative for PE.   ultrasound of the lower extremity negative   GERD (gastroesophageal reflux disease) ---On  Carafate   Mild intermittent asthma -Prn nebs --Completed 3 weeks of Decadron   Dietitian input noted  Ambulatory in the room by self. Seen by PT Appreciate Palliative care input  Procedures: Family communication : dter at bedside today Consults :Oncology CODE STATUS: full DVT Prophylaxis :lovenox Level of care: Telemetry Medical Status is: Inpatient Remains inpatient appropriate because: sepsis, Pneumonia  Anticipate d/c tomorrow    TOTAL TIME TAKING CARE OF THIS PATIENT: 35 minutes.  >50% time spent on counselling and coordination of care  Note: This dictation was prepared with Dragon dictation along with smaller phrase technology. Any transcriptional errors that result from this process are unintentional.  Fritzi Mandes M.D    [facility]   CC: Primary care physician; Leone Haven, MD

## 2022-01-24 NOTE — Progress Notes (Signed)
Mobility Specialist - Progress Note    01/24/22 1541  Mobility  Activity Dangled on edge of bed;Ambulated with assistance in hallway  Level of Assistance Standby assist, set-up cues, supervision of patient - no hands on  Assistive Device None  Distance Ambulated (ft) 80 ft  Activity Response Tolerated well  Mobility Referral Yes  $Mobility charge 1 Mobility   Pt resting in bed on 3L upon entry. Pt STS and ambulates in hallway around NS SBA. Pt took seated rest break between walks due to pt desat to SpO2 level 84%. Rn Notified. Pt SpO2 level returned to 92-93% and patient resumed mobility back to room. Pt returned to bed and left with needs in reach and family present.

## 2022-01-25 ENCOUNTER — Other Ambulatory Visit: Payer: Self-pay

## 2022-01-25 DIAGNOSIS — J189 Pneumonia, unspecified organism: Secondary | ICD-10-CM | POA: Diagnosis not present

## 2022-01-25 MED ORDER — HEPARIN SOD (PORK) LOCK FLUSH 100 UNIT/ML IV SOLN
500.0000 [IU] | Freq: Once | INTRAVENOUS | Status: DC
  Filled 2022-01-25: qty 5

## 2022-01-25 MED ORDER — SODIUM CHLORIDE 0.9 % IV SOLN
2.0000 g | INTRAVENOUS | Status: AC
  Administered 2022-01-25: 2 g via INTRAVENOUS
  Filled 2022-01-25: qty 20

## 2022-01-25 MED ORDER — BENZONATATE 100 MG PO CAPS
100.0000 mg | ORAL_CAPSULE | Freq: Four times a day (QID) | ORAL | 1 refills | Status: AC
  Filled 2022-01-25: qty 30, 8d supply, fill #0
  Filled 2022-02-04: qty 30, 8d supply, fill #1

## 2022-01-25 MED ORDER — LORAZEPAM 0.5 MG PO TABS
0.5000 mg | ORAL_TABLET | Freq: Three times a day (TID) | ORAL | 0 refills | Status: DC | PRN
  Filled 2022-01-25: qty 25, 9d supply, fill #0

## 2022-01-25 MED ORDER — CEFDINIR 250 MG/5ML PO SUSR
300.0000 mg | Freq: Two times a day (BID) | ORAL | 0 refills | Status: DC
  Filled 2022-01-25: qty 60, 5d supply, fill #0

## 2022-01-25 MED ORDER — CEFDINIR 250 MG/5ML PO SUSR: 300.0000 mg | Freq: Two times a day (BID) | ORAL | Status: DC

## 2022-01-25 MED ORDER — LEVALBUTEROL HCL 0.63 MG/3ML IN NEBU
0.6300 mg | INHALATION_SOLUTION | Freq: Four times a day (QID) | RESPIRATORY_TRACT | 12 refills | Status: AC | PRN
  Filled 2022-01-25: qty 75, 6d supply, fill #0

## 2022-01-25 MED ORDER — SALINE SPRAY 0.65 % NA SOLN
2.0000 | NASAL | 0 refills | Status: AC | PRN
  Filled 2022-01-25: qty 30, 25d supply, fill #0

## 2022-01-25 NOTE — Discharge Summary (Signed)
Physician Discharge Summary   Patient: Amanda Skinner MRN: [id number] DOB: 05/30/1960  Admit date:     01/21/2022  Discharge date: 01/25/22  Discharge Physician: Fritzi Mandes   PCP: Leone Haven, MD   Recommendations at discharge:    F/u [name] on12/07/2021  Discharge Diagnoses: Principal Problem:   Multifocal pneumonia Active Problems:   Sepsis ([place])   Invasive carcinoma of breast ([place])   Chronic bilateral pleural effusions   Chronic respiratory failure with hypoxia (H[facility])   Mild intermittent asthma   GERD (gastroesophageal reflux disease)   Palliative care encounter   Elevated d-dimer  Hospital Course: Amanda Skinner is a 61 y.o. female with medical history significant of metastatic breast cancer to bone, lungs, lung pleura and lymph nodes.  Also history of asthma and pains in her arms.  Patient started having worsening shortness of breath since this morning a dry cough.  She went to see her oncologist and was sent into the emergency room.   In the emergency room she had a elevated D-dimer and a CT scan of the chest was ordered that showed no pulmonary embolism did show pneumonia and increased size and number of nodules in the lungs.  Extensive lymph nodes in the axilla, hilum, supraclavicular, mediastinal and subdiaphragmatic and bilateral pleural effusions.  Areas of alveolar pulmonary parenchymal consolidation could represent superimposed pneumonia.      Multifocal pneumonia - IV Rocephin --change to po cefdinir and  completed Zithromax  --low grade temp 100.7 at 3 am (11/30)--afebrile today --tessalon perrles for dry cough --cont Chronic home oxygen  --blood cultures negative  -- Patient is tachycardic and tachypneic which would be criteria for sepsis.   Sepsis ([place]) Present on admission with tachycardia and tachypnea and multifocal pneumonia. --as above --improving sepsis   Invasive carcinoma of breast ([place]) Metastatic to bone, lungs, lung pleura and lymph  nodes.   Oncology consultation with Dr Rao--f/u 01/28/22 Pt undergoing XRT at present--now on hold. Chemo to be resumed   Chronic bilateral pleural effusions --s/p Left sided thoracentesis 12/1--550 cc removed   Chronic respiratory failure with hypoxia ([place]) --Started on oxygen around 3 to 4 weeks ago on 2 L of oxygen. --pt now requiring 3.5 liters /min   Elevated d-dimer --CT scan negative for PE.   --ultrasound of the lower extremity negative   GERD (gastroesophageal reflux disease) ---On Carafate   Mild intermittent asthma -Prn nebs --Completed 3 weeks of Decadron    Dietitian input noted   Ambulatory in the room by self. Seen by PT Appreciate Palliative care input   Procedures: left thoracentesis Family communication : dter at bedside today Consults :Oncology CODE STATUS: full DVT Prophylaxis :lovenox  Discussed d/c plans. Pt and dter in agreement    Pain control - [place] Controlled Substance Reporting System database was reviewed. and patient was instructed, not to drive, operate heavy machinery, perform activities at heights, swimming or participation in water activities or provide baby-sitting services while on Pain, Sleep and Anxiety Medications; until their outpatient Physician has advised to do so again. Also recommended to not to take more than prescribed Pain, Sleep and Anxiety Medications.  Disposition: Home Diet recommendation:  Discharge Diet Orders (From admission, onward)     Start     Ordered   01/25/22 0000  Diet general        01/25/22 1128           Regular diet DISCHARGE MEDICATION: Allergies as of 01/25/2022  Reactions   Etodolac Other (See Comments)   Reaction:  Migraines         Medication List     STOP taking these medications    dexamethasone 4 MG tablet Commonly known as: DECADRON   ipratropium-albuterol 0.5-2.5 (3) MG/3ML Soln Commonly known as: DUONEB   ondansetron 4 MG tablet Commonly known as: Zofran        TAKE these medications    acetaminophen 500 MG tablet Commonly known as: TYLENOL Take 1,000 mg by mouth every 4 (four) hours as needed.   albuterol 108 (90 Base) MCG/ACT inhaler Commonly known as: VENTOLIN HFA INHALE 2 PUFFS INTO THE LUNGS EVERY 6 HOURS AS NEEDED FOR WHEEZING OR SHORTNESS OF BREATH.   benzonatate 100 MG capsule Commonly known as: TESSALON Take 1 capsule (100 mg total) by mouth 4 (four) times daily.   cefdinir 250 MG/5ML suspension Commonly known as: OMNICEF Take 6 mLs (300 mg total) by mouth 2 (two) times daily. Start taking on: January 26, 2022   estradiol 0.1 MG/GM vaginal cream Commonly known as: ESTRACE Place 2 g vaginally twice a week   fluticasone 50 MCG/ACT nasal spray Commonly known as: FLONASE Place 2 sprays into both nostrils daily.   gabapentin 100 MG capsule Commonly known as: NEURONTIN Take 1 capsule by mouth three times daily   ibuprofen 200 MG tablet Commonly known as: ADVIL Take 400 mg by mouth every 6 (six) hours as needed.   levalbuterol 0.63 MG/3ML nebulizer solution Commonly known as: XOPENEX Take 3 mLs (0.63 mg total) by nebulization every 6 (six) hours as needed for wheezing or shortness of breath.   lidocaine 2 % solution Commonly known as: XYLOCAINE Use as directed 15 mLs in the mouth or throat every 4 (four) hours as needed for mouth pain.   lidocaine-prilocaine cream Commonly known as: EMLA Apply a small amount topically over port 1 hour before use of port for chemo (Apply 1 Application topically as needed. Small amount of cream over port  1hour before use of port for chemo)   loratadine 5 MG chewable tablet Commonly known as: CLARITIN Chew 5 mg by mouth daily.   LORazepam 0.5 MG tablet Commonly known as: ATIVAN Take 1 tablet (0.5 mg total) by mouth every 8 (eight) hours as needed for anxiety.   magic mouthwash (multi-ingredient) oral suspension Swish and swallow with 1 to 2 teaspoonsful 4 times a day    mirtazapine 7.5 MG tablet Commonly known as: REMERON Take 1 tablet (7.5 mg total) by mouth at bedtime.   morphine CONCENTRATE 10 mg / 0.5 ml concentrated solution Take 0.25-0.5 mLs (5-10 mg total) by mouth every 4 (four) hours as needed for severe pain or shortness of breath.   ondansetron 4 MG disintegrating tablet Commonly known as: ZOFRAN-ODT Dissolve 1 tablet (4 mg total) on the tongue every 8 (eight) hours as needed. (Take 1 tablet (4 mg total) by mouth every 8 (eight) hours as needed.)   pseudoephedrine 120 MG 12 hr tablet Commonly known as: SUDAFED Take 120 mg by mouth daily as needed.   Serevent Diskus 50 MCG/ACT diskus inhaler Generic drug: salmeterol Inhale 1 puff into the lungs 2 (two) times daily.   sodium chloride 0.65 % Soln nasal spray Commonly known as: OCEAN Place 2 sprays into both nostrils every 2 (two) hours as needed for congestion.   Strata xrt Gel apply TO AFFECTED AREA THREE TIMES DAILY AS DIRECTED   sucralfate 1 GM/10ML suspension Commonly known as: Carafate Take  10 mLs (1 g total) by mouth 4 (four) times daily -  with meals and at bedtime.   traMADol 50 MG tablet Commonly known as: ULTRAM Take 50 mg by mouth every 6 (six) hours as needed.               Durable Medical Equipment  (From admission, onward)           Start     Ordered   01/25/22 1053  For home use only DME oxygen  Once       Question Answer Comment  Length of Need Lifetime   Mode or (Route) Nasal cannula   Liters per Minute 3.5   Frequency Continuous (stationary and portable oxygen unit needed)   Oxygen conserving device Yes   Oxygen delivery system Gas      01/25/22 1053            Follow-up Information     Leone Haven, MD. Schedule an appointment as soon as possible for a visit in 1 week(s).   Specialty: Family Medicine Contact information: [address] [place] [address] [phone]         Sindy Guadeloupe, MD. Daphane Shepherd on  01/28/2022.   Specialty: Oncology Why: on  your appt Contact information: [place] [address] [phone]                Discharge Exam:  GENERAL:  61 y.o.-year-old patient lying in the bed with no acute distress.  LUNGS: decreased breath sounds bilaterally, occ inspiratory wheezing, Port + CARDIOVASCULAR: S1, S2 normal. No murmurs,  tachy mild ABDOMEN: Soft, nontender, nondistended. Bowel sounds present.  EXTREMITIES: No  edema b/l.    NEUROLOGIC: nonfocal  patient is alert and awake SKIN: No obvious rash, lesion, or ulcer.   Condition at discharge: fair  The results of significant diagnostics from this hospitalization (including imaging, microbiology, ancillary and laboratory) are listed below for reference.   Imaging Studies: US THORACENTESIS ASP PLEURAL SPACE W/IMG GUIDE  Result Date: 01/23/2022 INDICATION: Patient with a history of breast cancer and recurrent pleural effusions. Interventional radiology asked to perform a therapeutic thoracentesis. EXAM: ULTRASOUND GUIDED THORACENTESIS MEDICATIONS: 1% lidocaine 10 mL COMPLICATIONS: None immediate. PROCEDURE: An ultrasound guided thoracentesis was thoroughly discussed with the patient and questions answered. The benefits, risks, alternatives and complications were also discussed. The patient understands and wishes to proceed with the procedure. Written consent was obtained. Ultrasound was performed to localize and mark an adequate pocket of fluid in the left chest. The area was then prepped and draped in the normal sterile fashion. 1% Lidocaine was used for local anesthesia. Under ultrasound guidance a 6 Fr Safe-T-Centesis catheter was introduced. Thoracentesis was performed. The catheter was removed and a dressing applied. FINDINGS: A total of approximately 550 mL of clear yellow fluid was removed. IMPRESSION: Successful ultrasound guided left thoracentesis yielding 550 mL of pleural fluid. Read by: Soyla Dryer, NP Electronically Signed   By: Albin Felling M.D.   On: 01/23/2022 15:03   DG Chest Port 1 View  Result Date: 01/23/2022 CLINICAL DATA:  Status post thoracentesis. EXAM: PORTABLE CHEST 1 VIEW COMPARISON:  Radiographs 01/21/2022 and 01/12/2022.  CT 01/21/2022. FINDINGS: 1435 hours. The patient remains mildly rotated to the right. Left IJ central venous catheter appears unchanged at the level of the lower SVC. The heart size and mediastinal contours are stable. Left pleural effusion has decreased in volume following thoracentesis. No evidence of pneumothorax.  Right pleural effusion and bibasilar airspace opacities have not significantly changed. The bones appear unremarkable. IMPRESSION: Decreased left pleural effusion following thoracentesis. No evidence of pneumothorax. No significant change in right pleural effusion and bibasilar airspace opacities. Electronically Signed   By: Richardean Sale M.D.   On: 01/23/2022 14:57   US Venous Img Lower Bilateral (DVT)  Result Date: 01/22/2022 CLINICAL DATA:  Metastatic breast cancer, dyspnea, chest pain, elevated D-dimer EXAM: BILATERAL LOWER EXTREMITY VENOUS DOPPLER ULTRASOUND TECHNIQUE: Gray-scale sonography with compression, as well as color and duplex ultrasound, were performed to evaluate the deep venous system(s) from the level of the common femoral vein through the popliteal and proximal calf veins. COMPARISON:  None Available. FINDINGS: VENOUS Normal compressibility of the common femoral, superficial femoral, and popliteal veins, as well as the visualized calf veins. Visualized portions of profunda femoral vein and great saphenous vein unremarkable. No filling defects to suggest DVT on grayscale or color Doppler imaging. Doppler waveforms show normal direction of venous flow, normal respiratory plasticity and response to augmentation. OTHER None. Limitations: none IMPRESSION: 1. Negative. Electronically Signed   By: Fidela Salisbury M.D.   On:  01/22/2022 10:00   CT Angio Chest PE W and/or Wo Contrast  Result Date: 01/21/2022 CLINICAL DATA:  Bilateral breast cancer, shortness of breath EXAM: CT ANGIOGRAPHY CHEST WITH CONTRAST TECHNIQUE: Multidetector CT imaging of the chest was performed using the standard protocol during bolus administration of intravenous contrast. Multiplanar CT image reconstructions and MIPs were obtained to evaluate the vascular anatomy. RADIATION DOSE REDUCTION: This exam was performed according to the departmental dose-optimization program which includes automated exposure control, adjustment of the mA and/or kV according to patient size and/or use of iterative reconstruction technique. CONTRAST:  75 mL OMNIPAQUE IOHEXOL 350 MG/ML SOLN COMPARISON:  11/27/2021 FINDINGS: Cardiovascular: Satisfactory opacification of the pulmonary arteries to the segmental level. No evidence of pulmonary embolism. Mild cardiomegaly. Small pericardial effusion, about 9 mm in thickness. Heart size. No pericardial effusion. Mediastinum/Nodes: Extensive suspicious mediastinal, hilar, axillary and subdiaphragmatic adenopathy is seen which represents progression. The largest hilar node is on the right measuring 3.3 cm. Mediastinal adenopathy is confluent. Supraclavicular masses on the left up to 2.2 cm consistent with metastasis. There is a left axillary mass that measures 3.6 cm as well as numerous additional suspicious left axillary nodes. Lungs/Pleura: There is extensive bibasilar consolidation with moderate bilateral pleural effusions and numerous pulmonary nodules and masses which represents significant interval progression of disease. The largest discrete lung mass on the right in the lower lobe is 4.5 cm. Right upper lobe area of consolidation in the and mass is about 5 cm. There are innumerable additional nodules throughout both lungs are of varying sizes. Upper Abdomen: Imaged portions of the upper abdominal organs are unremarkable.  Musculoskeletal: Thoracic degenerative changes noted. Left-sided Port-A-Cath tip is in the distal SVC. Review of the MIP images confirms the above findings. IMPRESSION: 1. No evidence of pulmonary embolism, aortic aneurysm or dissection. 2. Marked progression of disease with increase in size and number of lung nodules as well as extensive mediastinal, hilar, subdiaphragmatic, supraclavicular and left axillary adenopathy. 3. Bilateral pleural effusions. 4. Areas of alveolar pulmonary parenchymal consolidation could represent superimposed pneumonia. Electronically Signed   By: Sammie Bench M.D.   On: 01/21/2022 18:36   DG Chest 2 View  Result Date: 01/21/2022 CLINICAL DATA:  Shortness of breath. History of metastatic breast cancer. EXAM: CHEST - 2 VIEW COMPARISON:  Chest x-ray dated January 12, 2022. FINDINGS: Unchanged  left chest wall port catheter. The heart size and mediastinal contours are within normal limits. Increased small bilateral pleural effusions with worsening aeration at both lung bases. Multiple pulmonary nodules in both lungs are not significantly changed. No pneumothorax. No acute osseous abnormality. IMPRESSION: 1. Increased small bilateral pleural effusions with worsening bibasilar atelectasis and/or pneumonia. 2. Unchanged pulmonary metastatic disease. Electronically Signed   By: Titus Dubin M.D.   On: 01/21/2022 13:34   [place] CHEST (PLEURAL EFFUSION)  Result Date: 01/14/2022 CLINICAL DATA:  61 year old female with recurrent pleural effusion EXAM: CHEST ULTRASOUND COMPARISON:  01/02/2022 FINDINGS: Ultrasound performed with potential thoracentesis. Ultrasound demonstrates small pleural effusion on the right and the left. Results were shared with the patient who deferred thoracentesis at this time. IMPRESSION: Small bilateral pleural effusions. Electronically Signed   By: Corrie Mckusick D.O.   On: 01/14/2022 16:26   DG Shoulder Left  Result Date: 01/14/2022 CLINICAL DATA:   61 year old female with sudden onset sharp pain radiating to the arm pit and chest. Metastatic breast cancer. EXAM: LEFT SHOULDER - 2+ VIEW COMPARISON:  Left humerus series today. Recent chest radiographs 01/12/2022. FINDINGS: No glenohumeral joint dislocation. Proximal left humerus appears intact. Bone mineralization is within normal limits for age. No acute osseous abnormality identified. Left chest power port and pleural effusion do not appear significantly changed from 01/12/2022. Visible left ribs appear grossly intact. IMPRESSION: 1. No osseous abnormality identified about the left shoulder. 2. Left pleural effusion not significantly changed from recent 01/12/2022 radiographs. Electronically Signed   By: Genevie Ann M.D.   On: 01/14/2022 10:29   DG Humerus Left  Result Date: 01/14/2022 CLINICAL DATA:  61 year old female with sudden onset sharp pain radiating to the arm pit and chest. EXAM: LEFT HUMERUS - 2+ VIEW COMPARISON:  Left forearm series today. FINDINGS: Bone mineralization is within normal limits. Alignment appears grossly maintained at the left shoulder and elbow. There is no evidence of fracture or other focal bone lesions. Soft tissues are unremarkable. Partially visible left lung base veiling opacity suggesting pleural effusion. Left chest power port partially visible. IMPRESSION: 1. No osseous abnormality identified about the left humerus. 2. Partially visible left pleural effusion, Port-A-Cath. Electronically Signed   By: Genevie Ann M.D.   On: 01/14/2022 10:26   DG Forearm Left  Result Date: 01/14/2022 CLINICAL DATA:  61 year old female with sudden onset sharp pain radiating to the arm pit and chest. EXAM: LEFT FOREARM - 2 VIEW COMPARISON:  None Available. FINDINGS: Bone mineralization is within normal limits. There is no evidence of fracture or other focal bone lesions. No joint effusion is evident at the left elbow. Soft tissue contours appear within normal limits. IMPRESSION: Negative.  Electronically Signed   By: Genevie Ann M.D.   On: 01/14/2022 10:25   DG Chest 2 View  Result Date: 01/12/2022 CLINICAL DATA:  Shortness of breath. History of pleural effusion. History of breast cancer. EXAM: CHEST - 2 VIEW COMPARISON:  Chest two views 01/02/2022 and 01/01/2022; CT chest abdomen and pelvis 11/27/2021 FINDINGS: Cardiac silhouette and mediastinal contours are within normal limits. Left chest wall porta catheter tip overlies the central superior vena cava. Mild-to-moderate left and trace right pleural effusions are not significantly changed. Approximately 2 cm left lateral midlung nodular density appears unchanged from 01/01/2022 frontal radiograph. The more accurate measurement on CT on 11/27/2021 was up to approximately 1.2 cm. Of the right mid to lower lung smaller nodules as better seen on prior CT. Posteroinferior opacity on lateral view  corresponding to the dominant 4 cm posteroinferior left lower lobe mass on prior CT. No pneumothorax.  Mild bilateral lower lung interstitial thickening. Moderate multilevel degenerative disc changes of the thoracic spine. IMPRESSION: 1. Mild-to-moderate left and trace right pleural effusions are not significantly changed from 01/02/2022. 2. Posteroinferior opacity on lateral view corresponding to the dominant 4 cm posteroinferior left lower lobe mass on prior CT. Additional bilateral pulmonary nodules again consistent with pulmonary metastases. Electronically Signed   By: Yvonne Kendall M.D.   On: 01/12/2022 16:37   MR Brain W Wo Contrast  Result Date: 01/07/2022 CLINICAL DATA:  Evaluate for metastatic disease. Breast cancer staging EXAM: MRI HEAD WITHOUT AND WITH CONTRAST TECHNIQUE: Multiplanar, multiecho pulse sequences of the brain and surrounding structures were obtained without and with intravenous contrast. CONTRAST:  41mL GADAVIST GADOBUTROL 1 MMOL/ML IV SOLN COMPARISON:  06/18/2021 FINDINGS: Brain: No enhancement or swelling to suggest metastatic  disease. Mild chronic small vessel ischemia in the cerebral white matter and pons. No acute or subacute infarct, hemorrhage, hydrocephalus, or collection Vascular: Normal flow voids and vascular enhancements Skull and upper cervical spine: Posterior scalp nodule closely associated with the right para median calvarium, see 16:113, not seen on priors but also not hypermetabolic on recent PET CT. The nodule measures 10 x 3 mm, attention on follow-up. Sinuses/Orbits: Negative IMPRESSION: 1. Negative for metastatic disease to the brain. 2. New, small posterior scalp nodule which would be concerning for a metastatic deposit but was not hypermetabolic on recent PET, question recent trauma. Electronically Signed   By: Jorje Guild M.D.   On: 01/07/2022 12:38   US THORACENTESIS ASP PLEURAL SPACE W/IMG GUIDE  Result Date: 01/02/2022 INDICATION: Left pleural effusion EXAM: ULTRASOUND GUIDED LEFT THORACENTESIS MEDICATIONS: 8 cc 1% lidocaine COMPLICATIONS: None immediate. PROCEDURE: An ultrasound guided thoracentesis was thoroughly discussed with the patient and questions answered. The benefits, risks, alternatives and complications were also discussed. The patient understands and wishes to proceed with the procedure. Written consent was obtained. Ultrasound was performed to localize and mark an adequate pocket of fluid in the left chest. The area was then prepped and draped in the normal sterile fashion. 1% Lidocaine was used for local anesthesia. Under ultrasound guidance a 6 Fr Safe-T-Centesis catheter was introduced. Thoracentesis was performed. The catheter was removed and a dressing applied. FINDINGS: A total of approximately 450 cc of yellow fluid was removed. Samples were sent to the laboratory as requested by the clinical team. IMPRESSION: Successful ultrasound guided left thoracentesis yielding 450 cc of pleural fluid. Follow-up chest x-ray revealed no evidence of pneumothorax. Read by: Reatha Armour, PA-C  Electronically Signed   By: Aletta Edouard M.D.   On: 01/02/2022 13:18   DG Chest Port 1 View  Result Date: 01/02/2022 CLINICAL DATA:  Status post left thoracentesis. EXAM: PORTABLE CHEST 1 VIEW COMPARISON:  January 01, 2022. FINDINGS: No pneumothorax is noted status post left thoracentesis. Left pleural effusion may be slightly decreased. IMPRESSION: No pneumothorax status post left thoracentesis. Electronically Signed   By: Marijo Conception M.D.   On: 01/02/2022 12:29   DG Chest 2 View  Result Date: 01/01/2022 CLINICAL DATA:  Shortness of breath. Cough. EXAM: CHEST - 2 VIEW COMPARISON:  Chest CT 11/27/2021 FINDINGS: Accessed left chest port in place. Heart size grossly normal. Known mediastinal adenopathy on CT not well seen by radiograph. There is a left pleural effusion that is new from prior exam, left lower lobe mass noted on recent CT. Nodule in the  left mid lung measures 2.1 cm, increased. There is new blunting of the right costophrenic angle suggesting small effusion. Multiple pulmonary nodules in the right lung which were better delineated on recent CT. No pneumothorax. On limited assessment, no acute osseous findings. IMPRESSION: 1. Bilateral pleural effusions, left greater than right, new from prior exam. 2. Enlargement of left lung pulmonary nodule. Multiple right-sided pulmonary nodules better delineated on recent CT. 3. Known mediastinal adenopathy not well seen by radiograph. Electronically Signed   By: Keith Rake M.D.   On: 01/01/2022 17:20    Microbiology: Results for orders placed or performed during the hospital encounter of 01/21/22  Culture, blood (Routine X 2) w Reflex to ID Panel     Status: None (Preliminary result)   Collection Time: 01/21/22  3:45 PM   Specimen: BLOOD  Result Value Ref Range Status   Specimen Description BLOOD LEFT ASSIST CONTROL  Final   Special Requests   Final    BOTTLES DRAWN AEROBIC AND ANAEROBIC Blood Culture adequate volume   Culture    Final    NO GROWTH 4 DAYS Performed at [facility], [address] [place], [place] [address]    Report Status PENDING  Incomplete  Culture, blood (Routine X 2) w Reflex to ID Panel     Status: None (Preliminary result)   Collection Time: 01/22/22  4:02 AM   Specimen: BLOOD LEFT HAND  Result Value Ref Range Status   Specimen Description BLOOD LEFT HAND  Final   Special Requests   Final    BOTTLES DRAWN AEROBIC AND ANAEROBIC Blood Culture results may not be optimal due to an excessive volume of blood received in culture bottles   Culture   Final    NO GROWTH 3 DAYS Performed at [facility], [address] [place],  [address]    Report Status PENDING  Incomplete    Labs: CBC: Recent Labs  Lab 01/21/22 1344 01/21/22 1533 01/21/22 1534 01/22/22 0400  WBC 9.6 9.8 9.7 7.9  NEUTROABS  --  9.1*  --   --   HGB 12.5 11.9* 11.9* 10.5*  HCT 36.3 35.1* 34.4* 31.8*  MCV 96.3 96.7 95.3 97.0  PLT 196 227 202 735   Basic Metabolic Panel: Recent Labs  Lab 01/21/22 1534 01/22/22 0400 01/23/22 0628 01/24/22 0640  NA 137 138 139 136  K 3.8 3.1* 3.9 3.8  CL 102 106 104 101  CO2 26 26 27 27   GLUCOSE 123* 112* 105* 132*  BUN 19 14 9 10   CREATININE 0.69 0.59 0.58 0.60  CALCIUM 9.2 8.2* 9.4 9.1  MG  --   --  1.6* 1.7   Liver Function Tests: Recent Labs  Lab 01/21/22 1534  AST 19  ALT 21  ALKPHOS 90  BILITOT 0.7  PROT 6.3*  ALBUMIN 2.9*    Discharge time spent: greater than 30 minutes.  Signed: Fritzi Mandes, MD [facility] 01/25/2022

## 2022-01-25 NOTE — TOC Progression Note (Addendum)
Transition of Care [facility]) - Progression Note    Patient Details  Name: Amanda Skinner MRN: [id number] Date of Birth: 07-26-1960  [facility] of [facility] CM/SW Contact  Eileen Stanford, LCSW Phone Number: 01/25/2022, 11:09 AM  Clinical Narrative:   CSW notified [employer] of liter flow change and added humidifier for 02.          Expected Discharge Plan and Services                                                 Social Determinants of Health (SDOH) Interventions    Readmission Risk Interventions     No data to display

## 2022-01-25 NOTE — Progress Notes (Addendum)
Mobility Specialist - Progress Note   Pre-mobility: SpO2 (91) During mobility: SpO2 (90) Post-mobility: SPO2 (94)     01/25/22 1034  Mobility  Activity Ambulated with assistance in hallway;Stood at bedside;Dangled on edge of bed  Level of Assistance Standby assist, set-up cues, supervision of patient - no hands on  Assistive Device None  Distance Ambulated (ft) 160 ft  Activity Response Tolerated well  Mobility Referral Yes  $Mobility charge 1 Mobility   Pt resting in bed on 4.5 L upon entry. Pt STS and ambulates in hallway behind NS. Pt stayed steadily at 90% SpO2 at 4L throughout mobility session. Pt returned to bed and left with needs in reach. Pt daughter present in room.   [name] Mobility Specialist 01/25/22, 10:40 AM

## 2022-01-26 ENCOUNTER — Ambulatory Visit (INDEPENDENT_AMBULATORY_CARE_PROVIDER_SITE_OTHER): Payer: 59 | Admitting: Dermatology

## 2022-01-26 ENCOUNTER — Ambulatory Visit: Payer: 59

## 2022-01-26 ENCOUNTER — Other Ambulatory Visit: Payer: Self-pay

## 2022-01-26 ENCOUNTER — Other Ambulatory Visit: Payer: Self-pay | Admitting: Oncology

## 2022-01-26 DIAGNOSIS — L7211 Pilar cyst: Secondary | ICD-10-CM | POA: Diagnosis not present

## 2022-01-26 DIAGNOSIS — D225 Melanocytic nevi of trunk: Secondary | ICD-10-CM | POA: Diagnosis not present

## 2022-01-26 DIAGNOSIS — D229 Melanocytic nevi, unspecified: Secondary | ICD-10-CM

## 2022-01-26 DIAGNOSIS — D485 Neoplasm of uncertain behavior of skin: Secondary | ICD-10-CM

## 2022-01-26 DIAGNOSIS — L589 Radiodermatitis, unspecified: Secondary | ICD-10-CM

## 2022-01-26 DIAGNOSIS — L814 Other melanin hyperpigmentation: Secondary | ICD-10-CM

## 2022-01-26 DIAGNOSIS — D2271 Melanocytic nevi of right lower limb, including hip: Secondary | ICD-10-CM

## 2022-01-26 DIAGNOSIS — L821 Other seborrheic keratosis: Secondary | ICD-10-CM | POA: Diagnosis not present

## 2022-01-26 DIAGNOSIS — C50411 Malignant neoplasm of upper-outer quadrant of right female breast: Secondary | ICD-10-CM

## 2022-01-26 DIAGNOSIS — L578 Other skin changes due to chronic exposure to nonionizing radiation: Secondary | ICD-10-CM

## 2022-01-26 DIAGNOSIS — Z86018 Personal history of other benign neoplasm: Secondary | ICD-10-CM

## 2022-01-26 DIAGNOSIS — D2371 Other benign neoplasm of skin of right lower limb, including hip: Secondary | ICD-10-CM

## 2022-01-26 LAB — CULTURE, BLOOD (ROUTINE X 2)
Culture: NO GROWTH
Special Requests: ADEQUATE

## 2022-01-26 MED ORDER — DOXYCYCLINE MONOHYDRATE 100 MG PO CAPS
100.0000 mg | ORAL_CAPSULE | Freq: Two times a day (BID) | ORAL | 0 refills | Status: DC
  Filled 2022-01-26: qty 60, 30d supply, fill #0

## 2022-01-26 NOTE — Progress Notes (Signed)
Follow-Up Visit   Subjective  Amanda Skinner is a 61 y.o. female who presents for the following: Annual Exam (Hx dysplastic nevi. Patient with a spot at scalp, tender to touch for about 2-3 weeks. Also with a spot at left abdomen that she been using mupirocin. ).  She was just released from hospital, has pneumonia and taking Cefdinir.  Pt gets chemotherapy and radiation for metastatic breast cancer.  The patient presents for Total-Body Skin Exam (TBSE) for skin cancer screening and mole check.  The patient has spots, moles and lesions to be evaluated, some may be new or changing and the patient has concerns that these could be cancer.   The following portions of the chart were reviewed this encounter and updated as appropriate:       Review of Systems:  No other skin or systemic complaints except as noted in HPI or Assessment and Plan.  Objective  Well appearing patient in no apparent distress; mood and affect are within normal limits.  A full examination was performed including scalp, head, eyes, ears, nose, lips, neck, chest, axillae, abdomen, back, buttocks, bilateral upper extremities, bilateral lower extremities, hands, feet, fingers, toes, fingernails, and toenails. All findings within normal limits unless otherwise noted below.  right anterior thigh; right spinal lower back; Right 5th Plantar Toe right anterior thigh 2.0 mm speckled brown macule    right spinal lower back 2.0 mm med dark brown macule    Right 5th Plantar Toe 0.6 cm flesh papule, gray/brown center  upper occipital scalp 2.5 x 3 cm Subcutaneous nodule at scalp.   left abdomen at waistline 5 mm dusky pink papule with central crust  upper back, chest Pink patch, mild scale    Assessment & Plan  Nevus right anterior thigh; right spinal lower back; Right 5th Plantar Toe  Benign-appearing. Stable compared to previous visit. Observation.  Call clinic for new or changing moles.  Recommend daily use of broad  spectrum spf 30+ sunscreen to sun-exposed areas.    Pilar cyst upper occipital scalp  Inflamed. Benign-appearing. Exam most consistent with a pilar cyst. Discussed that a cyst is a benign growth that can grow over time and sometimes get irritated or inflamed. Recommend observation if it is not bothersome. Discussed option of surgical excision to remove it if it is growing, symptomatic, or other changes noted. Please call for new or changing lesions so they can be evaluated.  Patient currently taking cefdinir for pneumonia.  Will send in doxycycline monohydrate 100 mg bid with food #60 0RF After finishing Cefdinir, patient will take twice daily for 2 weeks if flared then decrease to once daily if needed.   Discussed I&D if worsening.  Also may consider excision one no longer inflamed.  Doxycycline should be taken with food to prevent nausea. Do not lay down for 30 minutes after taking. Be cautious with sun exposure and use good sun protection while on this medication. Pregnant women should not take this medication.      doxycycline (MONODOX) 100 MG capsule - upper occipital scalp Take 1 capsule (100 mg total) by mouth 2 (two) times daily. Take with food as directed  Neoplasm of uncertain behavior of skin left abdomen at waistline  Irritated nevus vs inflammatory papule  Benign-appearing.  Observation.  Call clinic for new or changing lesions.  Recommend daily use of broad spectrum spf 30+ sunscreen to sun-exposed areas.    Radiation dermatitis upper back, chest  Chronic skin changes associated with radiation treatment  for cancer.  She may have higher long term risk of skin cancer in this area.  Continue sun protection.  Recommend mild soap and moisturizing cream 1-2 times daily.  Gentle skin care handout provided.   Recommend daily broad spectrum sunscreen SPF 30+ to sun-exposed areas, reapply every 2 hours as needed. Call for new or changing lesions.  Staying in the shade or  wearing long sleeves, sun glasses (UVA+UVB protection) and wide brim hats (4-inch brim around the entire circumference of the hat) are also recommended for sun protection.    History of Dysplastic Nevi - No evidence of recurrence today - Recommend regular full body skin exams - Recommend daily broad spectrum sunscreen SPF 30+ to sun-exposed areas, reapply every 2 hours as needed.  - Call if any new or changing lesions are noted between office visits  Lentigines - Scattered tan macules - Due to sun exposure - Benign-appearing, observe - Recommend daily broad spectrum sunscreen SPF 30+ to sun-exposed areas, reapply every 2 hours as needed. - Call for any changes  Seborrheic Keratoses - Stuck-on, waxy, tan-brown papules and/or plaques  - Benign-appearing - Discussed benign etiology and prognosis. - Observe - Call for any changes  Melanocytic Nevi - Tan-brown and/or pink-flesh-colored symmetric macules and papules - Benign appearing on exam today - Observation - Call clinic for new or changing moles - Recommend daily use of broad spectrum spf 30+ sunscreen to sun-exposed areas.   Hemangiomas - Red papules - Discussed benign nature - Observe - Call for any changes  Actinic Damage - Chronic condition, secondary to cumulative UV/sun exposure - diffuse scaly erythematous macules with underlying dyspigmentation - Recommend daily broad spectrum sunscreen SPF 30+ to sun-exposed areas, reapply every 2 hours as needed.  - Staying in the shade or wearing long sleeves, sun glasses (UVA+UVB protection) and wide brim hats (4-inch brim around the entire circumference of the hat) are also recommended for sun protection.  - Call for new or changing lesions.  Skin cancer screening performed today.  Dermatofibroma - Firm pink/brown papulenodule with dimple sign at right lateral thigh - Benign appearing - Call for any changes  Return in about 1 year (around 01/27/2023) for TBSE, Hx  Dysplastic Nevi, Hx AK.  Graciella Belton, RMA, am acting as scribe for Brendolyn Patty, MD .  Documentation: I have reviewed the above documentation for accuracy and completeness, and I agree with the above.  Brendolyn Patty MD

## 2022-01-26 NOTE — Patient Instructions (Addendum)
Will send in doxycycline monohydrate 100 mg bid with food #60 0RF Patient will take twice daily for 2 weeks if flared then decrease to once daily if needed.   Doxycycline should be taken with food to prevent nausea. Do not lay down for 30 minutes after taking. Be cautious with sun exposure and use good sun protection while on this medication. Pregnant women should not take this medication.   Melanoma ABCDEs  Melanoma is the most dangerous type of skin cancer, and is the leading cause of death from skin disease.  You are more likely to develop melanoma if you: Have light-colored skin, light-colored eyes, or red or blond hair Spend a lot of time in the sun Tan regularly, either outdoors or in a tanning bed Have had blistering sunburns, especially during childhood Have a close family member who has had a melanoma Have atypical moles or large birthmarks  Early detection of melanoma is key since treatment is typically straightforward and cure rates are extremely high if we catch it early.   The first sign of melanoma is often a change in a mole or a new dark spot.  The ABCDE system is a way of remembering the signs of melanoma.  A for asymmetry:  The two halves do not match. B for border:  The edges of the growth are irregular. C for color:  A mixture of colors are present instead of an even brown color. D for diameter:  Melanomas are usually (but not always) greater than 53mm - the size of a pencil eraser. E for evolution:  The spot keeps changing in size, shape, and color.  Please check your skin once per month between visits. You can use a small mirror in front and a large mirror behind you to keep an eye on the back side or your body.   If you see any new or changing lesions before your next follow-up, please call to schedule a visit.  Please continue daily skin protection including broad spectrum sunscreen SPF 30+ to sun-exposed areas, reapplying every 2 hours as needed when you're  outdoors.    Due to recent changes in healthcare laws, you may see results of your pathology and/or laboratory studies on MyChart before the doctors have had a chance to review them. We understand that in some cases there may be results that are confusing or concerning to you. Please understand that not all results are received at the same time and often the doctors may need to interpret multiple results in order to provide you with the best plan of care or course of treatment. Therefore, we ask that you please give [place] 2 business days to thoroughly review all your results before contacting the office for clarification. Should we see a critical lab result, you will be contacted sooner.   If You Need Anything After Your Visit  If you have any questions or concerns for your doctor, please call our main line at [phone] and press option 4 to reach your doctor's medical assistant. If no one answers, please leave a voicemail as directed and we will return your call as soon as possible. Messages left after 4 pm will be answered the following business day.   You may also send [place] a message via [place]. We typically respond to MyChart messages within 1-2 business days.  For prescription refills, please ask your pharmacy to contact our office. Our fax number is [phone].  If you have an urgent issue when the clinic is closed  that cannot wait until the next business day, you can page your doctor at the number below.    Please note that while we do our best to be available for urgent issues outside of office hours, we are not available 24/7.   If you have an urgent issue and are unable to reach [place], you may choose to seek medical care at your doctor's office, retail clinic, urgent care center, or emergency room.  If you have a medical emergency, please immediately call 911 or go to the emergency department.  Pager Numbers  - Dr. Nehemiah Massed: [phone]  - Dr. Laurence Ferrari: [phone]  - Dr. Nicole Kindred:  [phone]  In the event of inclement weather, please call our main line at [phone] for an update on the status of any delays or closures.  Dermatology Medication Tips: Please keep the boxes that topical medications come in in order to help keep track of the instructions about where and how to use these. Pharmacies typically print the medication instructions only on the boxes and not directly on the medication tubes.   If your medication is too expensive, please contact our office at [phone] option 4 or send [place] a message through [place].   We are unable to tell what your co-pay for medications will be in advance as this is different depending on your insurance coverage. However, we may be able to find a substitute medication at lower cost or fill out paperwork to get insurance to cover a needed medication.   If a prior authorization is required to get your medication covered by your insurance company, please allow [place] 1-2 business days to complete this process.  Drug prices often vary depending on where the prescription is filled and some pharmacies may offer cheaper prices.  The website www.goodrx.com contains coupons for medications through different pharmacies. The prices here do not account for what the cost may be with help from insurance (it may be cheaper with your insurance), but the website can give you the price if you did not use any insurance.  - You can print the associated coupon and take it with your prescription to the pharmacy.  - You may also stop by our office during regular business hours and pick up a GoodRx coupon card.  - If you need your prescription sent electronically to a different pharmacy, notify our office through [facility] or by phone at [phone] option 4.     Si Usted Necesita Algo Despus de Su Visita  Tambin puede enviarnos un mensaje a travs de Pharmacist, community. Por lo general respondemos a los mensajes de MyChart en el transcurso de 1 a 2  das hbiles.  Para renovar recetas, por favor pida a su farmacia que se ponga en contacto con nuestra oficina. Harland Dingwall de fax es [place] [phone].  Si tiene un asunto urgente cuando la clnica est cerrada y que no puede esperar hasta el siguiente da hbil, puede llamar/localizar a su doctor(a) al nmero que aparece a continuacin.   Por favor, tenga en cuenta que aunque hacemos todo lo posible para estar disponibles para asuntos urgentes fuera del horario de [place], no estamos disponibles las 24 horas del da, los 7 das de la [place].   Si tiene un problema urgente y no puede comunicarse con nosotros, puede optar por buscar atencin mdica  en el consultorio de su doctor(a), en una clnica privada, en un centro de atencin urgente o en una sala de emergencias.  Si tiene [employer], por  favor llame inmediatamente al 911 o vaya a la sala de emergencias.  Nmeros de bper  - Dr. Nehemiah Massed: [phone]  - Dra. Moye: [phone]  - Dra. Nicole Kindred: [phone]  En caso de inclemencias del [place], por favor llame a Johnsie Kindred principal al [phone] para una actualizacin sobre el [place] de cualquier retraso o cierre.  Consejos para la medicacin en dermatologa: Por favor, guarde las cajas en las que vienen los medicamentos de uso tpico para ayudarle a seguir las instrucciones sobre dnde y cmo usarlos. Las farmacias generalmente imprimen las instrucciones del medicamento slo en las cajas y no directamente en los tubos del [place].   Si su medicamento es muy caro, por favor, pngase en contacto con Zigmund Daniel llamando al [phone] y presione la opcin 4 o envenos un mensaje a travs de Pharmacist, community.   No podemos decirle cul ser su copago por los medicamentos por adelantado ya que esto es diferente dependiendo de la cobertura de su seguro. Sin embargo, es posible que podamos encontrar un medicamento sustituto a Electrical engineer un formulario para que el  seguro cubra el medicamento que se considera necesario.   Si se requiere una autorizacin previa para que su compaa de seguros [place] su medicamento, por favor permtanos de 1 a 2 das hbiles para completar este proceso.  Los precios de los medicamentos varan con frecuencia dependiendo del Environmental consultant de dnde se surte la receta y alguna farmacias pueden ofrecer precios ms baratos.  El sitio web www.goodrx.com tiene cupones para medicamentos de Airline pilot. Los precios aqu no tienen en cuenta lo que podra costar con la ayuda del seguro (puede ser ms barato con su seguro), pero el sitio web puede darle el precio si no utiliz Research scientist (physical sciences).  - Puede imprimir el cupn correspondiente y llevarlo con su receta a la farmacia.  - Tambin puede pasar por nuestra oficina durante el horario de atencin regular y Charity fundraiser una tarjeta de cupones de GoodRx.  - Si necesita que su receta se enve electrnicamente a una farmacia diferente, informe a nuestra oficina a travs de MyChart de [place] o por telfono llamando al [phone] y presione la opcin 4.

## 2022-01-27 ENCOUNTER — Ambulatory Visit: Payer: 59

## 2022-01-27 ENCOUNTER — Telehealth: Payer: Self-pay | Admitting: *Deleted

## 2022-01-27 ENCOUNTER — Emergency Department: Payer: 59

## 2022-01-27 ENCOUNTER — Inpatient Hospital Stay: Payer: 59 | Attending: Oncology

## 2022-01-27 ENCOUNTER — Other Ambulatory Visit: Payer: Self-pay

## 2022-01-27 ENCOUNTER — Encounter: Payer: Self-pay | Admitting: Hospice and Palliative Medicine

## 2022-01-27 ENCOUNTER — Inpatient Hospital Stay (HOSPITAL_BASED_OUTPATIENT_CLINIC_OR_DEPARTMENT_OTHER): Payer: 59 | Admitting: Hospice and Palliative Medicine

## 2022-01-27 ENCOUNTER — Inpatient Hospital Stay
Admission: EM | Admit: 2022-01-27 | Discharge: 2022-02-03 | DRG: 193 | Disposition: A | Discharge status: Hospice | Payer: 59 | Source: Ambulatory Visit | Attending: Internal Medicine | Admitting: Internal Medicine

## 2022-01-27 ENCOUNTER — Encounter: Payer: Self-pay | Admitting: Emergency Medicine

## 2022-01-27 VITALS — BP 95/54 | HR 130 | Temp 101.8°F

## 2022-01-27 DIAGNOSIS — R200 Anesthesia of skin: Secondary | ICD-10-CM

## 2022-01-27 DIAGNOSIS — R0600 Dyspnea, unspecified: Principal | ICD-10-CM

## 2022-01-27 DIAGNOSIS — Z9221 Personal history of antineoplastic chemotherapy: Secondary | ICD-10-CM | POA: Diagnosis not present

## 2022-01-27 DIAGNOSIS — Z9981 Dependence on supplemental oxygen: Secondary | ICD-10-CM

## 2022-01-27 DIAGNOSIS — C7931 Secondary malignant neoplasm of brain: Secondary | ICD-10-CM | POA: Diagnosis present

## 2022-01-27 DIAGNOSIS — G9389 Other specified disorders of brain: Secondary | ICD-10-CM | POA: Diagnosis not present

## 2022-01-27 DIAGNOSIS — Z515 Encounter for palliative care: Secondary | ICD-10-CM | POA: Diagnosis not present

## 2022-01-27 DIAGNOSIS — R54 Age-related physical debility: Secondary | ICD-10-CM | POA: Diagnosis present

## 2022-01-27 DIAGNOSIS — R509 Fever, unspecified: Secondary | ICD-10-CM | POA: Diagnosis not present

## 2022-01-27 DIAGNOSIS — Z170 Estrogen receptor positive status [ER+]: Secondary | ICD-10-CM

## 2022-01-27 DIAGNOSIS — Z9889 Other specified postprocedural states: Secondary | ICD-10-CM

## 2022-01-27 DIAGNOSIS — Z1152 Encounter for screening for COVID-19: Secondary | ICD-10-CM

## 2022-01-27 DIAGNOSIS — J189 Pneumonia, unspecified organism: Principal | ICD-10-CM | POA: Diagnosis present

## 2022-01-27 DIAGNOSIS — C7801 Secondary malignant neoplasm of right lung: Secondary | ICD-10-CM | POA: Diagnosis present

## 2022-01-27 DIAGNOSIS — M7582 Other shoulder lesions, left shoulder: Secondary | ICD-10-CM | POA: Diagnosis not present

## 2022-01-27 DIAGNOSIS — C7802 Secondary malignant neoplasm of left lung: Secondary | ICD-10-CM | POA: Diagnosis present

## 2022-01-27 DIAGNOSIS — R52 Pain, unspecified: Secondary | ICD-10-CM | POA: Insufficient documentation

## 2022-01-27 DIAGNOSIS — G4700 Insomnia, unspecified: Secondary | ICD-10-CM | POA: Diagnosis not present

## 2022-01-27 DIAGNOSIS — C50411 Malignant neoplasm of upper-outer quadrant of right female breast: Secondary | ICD-10-CM | POA: Diagnosis not present

## 2022-01-27 DIAGNOSIS — E876 Hypokalemia: Secondary | ICD-10-CM | POA: Diagnosis not present

## 2022-01-27 DIAGNOSIS — E871 Hypo-osmolality and hyponatremia: Secondary | ICD-10-CM | POA: Diagnosis present

## 2022-01-27 DIAGNOSIS — M75112 Incomplete rotator cuff tear or rupture of left shoulder, not specified as traumatic: Secondary | ICD-10-CM | POA: Diagnosis not present

## 2022-01-27 DIAGNOSIS — Z808 Family history of malignant neoplasm of other organs or systems: Secondary | ICD-10-CM

## 2022-01-27 DIAGNOSIS — C771 Secondary and unspecified malignant neoplasm of intrathoracic lymph nodes: Secondary | ICD-10-CM | POA: Diagnosis present

## 2022-01-27 DIAGNOSIS — R0902 Hypoxemia: Secondary | ICD-10-CM

## 2022-01-27 DIAGNOSIS — R918 Other nonspecific abnormal finding of lung field: Secondary | ICD-10-CM | POA: Diagnosis not present

## 2022-01-27 DIAGNOSIS — M25512 Pain in left shoulder: Secondary | ICD-10-CM | POA: Diagnosis not present

## 2022-01-27 DIAGNOSIS — C782 Secondary malignant neoplasm of pleura: Secondary | ICD-10-CM | POA: Diagnosis present

## 2022-01-27 DIAGNOSIS — J9621 Acute and chronic respiratory failure with hypoxia: Secondary | ICD-10-CM | POA: Diagnosis present

## 2022-01-27 DIAGNOSIS — B370 Candidal stomatitis: Secondary | ICD-10-CM | POA: Diagnosis not present

## 2022-01-27 DIAGNOSIS — Z66 Do not resuscitate: Secondary | ICD-10-CM | POA: Diagnosis present

## 2022-01-27 DIAGNOSIS — Z853 Personal history of malignant neoplasm of breast: Secondary | ICD-10-CM

## 2022-01-27 DIAGNOSIS — R202 Paresthesia of skin: Secondary | ICD-10-CM | POA: Diagnosis present

## 2022-01-27 DIAGNOSIS — C50919 Malignant neoplasm of unspecified site of unspecified female breast: Secondary | ICD-10-CM | POA: Diagnosis not present

## 2022-01-27 DIAGNOSIS — S4992XA Unspecified injury of left shoulder and upper arm, initial encounter: Secondary | ICD-10-CM | POA: Diagnosis not present

## 2022-01-27 DIAGNOSIS — Y95 Nosocomial condition: Secondary | ICD-10-CM | POA: Diagnosis present

## 2022-01-27 DIAGNOSIS — M79605 Pain in left leg: Secondary | ICD-10-CM | POA: Diagnosis not present

## 2022-01-27 DIAGNOSIS — R110 Nausea: Secondary | ICD-10-CM | POA: Diagnosis present

## 2022-01-27 DIAGNOSIS — C50911 Malignant neoplasm of unspecified site of right female breast: Secondary | ICD-10-CM | POA: Diagnosis not present

## 2022-01-27 DIAGNOSIS — M19012 Primary osteoarthritis, left shoulder: Secondary | ICD-10-CM | POA: Diagnosis present

## 2022-01-27 DIAGNOSIS — J90 Pleural effusion, not elsewhere classified: Secondary | ICD-10-CM | POA: Diagnosis present

## 2022-01-27 DIAGNOSIS — Z87440 Personal history of urinary (tract) infections: Secondary | ICD-10-CM

## 2022-01-27 DIAGNOSIS — A419 Sepsis, unspecified organism: Secondary | ICD-10-CM | POA: Diagnosis present

## 2022-01-27 DIAGNOSIS — J4520 Mild intermittent asthma, uncomplicated: Secondary | ICD-10-CM | POA: Diagnosis not present

## 2022-01-27 DIAGNOSIS — Z8249 Family history of ischemic heart disease and other diseases of the circulatory system: Secondary | ICD-10-CM

## 2022-01-27 DIAGNOSIS — D638 Anemia in other chronic diseases classified elsewhere: Secondary | ICD-10-CM | POA: Diagnosis present

## 2022-01-27 DIAGNOSIS — J910 Malignant pleural effusion: Secondary | ICD-10-CM | POA: Diagnosis not present

## 2022-01-27 DIAGNOSIS — R0602 Shortness of breath: Secondary | ICD-10-CM | POA: Diagnosis not present

## 2022-01-27 DIAGNOSIS — M75102 Unspecified rotator cuff tear or rupture of left shoulder, not specified as traumatic: Secondary | ICD-10-CM | POA: Diagnosis present

## 2022-01-27 DIAGNOSIS — C7951 Secondary malignant neoplasm of bone: Secondary | ICD-10-CM | POA: Diagnosis present

## 2022-01-27 DIAGNOSIS — Z79899 Other long term (current) drug therapy: Secondary | ICD-10-CM

## 2022-01-27 DIAGNOSIS — J939 Pneumothorax, unspecified: Secondary | ICD-10-CM | POA: Diagnosis not present

## 2022-01-27 DIAGNOSIS — G893 Neoplasm related pain (acute) (chronic): Secondary | ICD-10-CM | POA: Diagnosis present

## 2022-01-27 DIAGNOSIS — R112 Nausea with vomiting, unspecified: Secondary | ICD-10-CM

## 2022-01-27 DIAGNOSIS — M7542 Impingement syndrome of left shoulder: Secondary | ICD-10-CM | POA: Diagnosis not present

## 2022-01-27 DIAGNOSIS — Z823 Family history of stroke: Secondary | ICD-10-CM

## 2022-01-27 DIAGNOSIS — Z886 Allergy status to analgesic agent status: Secondary | ICD-10-CM

## 2022-01-27 DIAGNOSIS — C801 Malignant (primary) neoplasm, unspecified: Secondary | ICD-10-CM | POA: Diagnosis not present

## 2022-01-27 DIAGNOSIS — C3491 Malignant neoplasm of unspecified part of right bronchus or lung: Secondary | ICD-10-CM | POA: Diagnosis not present

## 2022-01-27 LAB — CBC WITH DIFFERENTIAL/PLATELET
Abs Immature Granulocytes: 0.05 K/uL (ref 0.00–0.07)
Abs Immature Granulocytes: 0.08 10*3/uL — ABNORMAL HIGH (ref 0.00–0.07)
Basophils Absolute: 0 K/uL (ref 0.0–0.1)
Basophils Absolute: 0 10*3/uL (ref 0.0–0.1)
Basophils Relative: 0 %
Basophils Relative: 0 %
Eosinophils Absolute: 0.1 K/uL (ref 0.0–0.5)
Eosinophils Absolute: 0.1 10*3/uL (ref 0.0–0.5)
Eosinophils Relative: 1 %
Eosinophils Relative: 1 %
HCT: 32.1 % — ABNORMAL LOW (ref 36.0–46.0)
HCT: 32.7 % — ABNORMAL LOW (ref 36.0–46.0)
Hemoglobin: 10.9 g/dL — ABNORMAL LOW (ref 12.0–15.0)
Hemoglobin: 11.1 g/dL — ABNORMAL LOW (ref 12.0–15.0)
Immature Granulocytes: 1 %
Immature Granulocytes: 1 %
Lymphocytes Relative: 4 %
Lymphocytes Relative: 3 %
Lymphs Abs: 0.3 K/uL — ABNORMAL LOW (ref 0.7–4.0)
Lymphs Abs: 0.3 10*3/uL — ABNORMAL LOW (ref 0.7–4.0)
MCH: 32.2 pg (ref 26.0–34.0)
MCH: 32.3 pg (ref 26.0–34.0)
MCHC: 34 g/dL (ref 30.0–36.0)
MCHC: 33.9 g/dL (ref 30.0–36.0)
MCV: 95 fL (ref 80.0–100.0)
MCV: 95.1 fL (ref 80.0–100.0)
Monocytes Absolute: 0.7 K/uL (ref 0.1–1.0)
Monocytes Absolute: 0.7 10*3/uL (ref 0.1–1.0)
Monocytes Relative: 9 %
Monocytes Relative: 9 %
Neutro Abs: 6.4 K/uL (ref 1.7–7.7)
Neutro Abs: 7.4 10*3/uL (ref 1.7–7.7)
Neutrophils Relative %: 85 %
Neutrophils Relative %: 86 %
Platelets: 265 K/uL (ref 150–400)
Platelets: 267 10*3/uL (ref 150–400)
RBC: 3.38 MIL/uL — ABNORMAL LOW (ref 3.87–5.11)
RBC: 3.44 MIL/uL — ABNORMAL LOW (ref 3.87–5.11)
RDW: 15.1 % (ref 11.5–15.5)
RDW: 15.2 % (ref 11.5–15.5)
WBC: 7.5 K/uL (ref 4.0–10.5)
WBC: 8.6 10*3/uL (ref 4.0–10.5)
nRBC: 0 % (ref 0.0–0.2)
nRBC: 0 % (ref 0.0–0.2)

## 2022-01-27 LAB — COMPREHENSIVE METABOLIC PANEL WITH GFR
ALT: 24 U/L (ref 0–44)
AST: 24 U/L (ref 15–41)
Albumin: 2.5 g/dL — ABNORMAL LOW (ref 3.5–5.0)
Alkaline Phosphatase: 78 U/L (ref 38–126)
Anion gap: 11 (ref 5–15)
BUN: 8 mg/dL (ref 8–23)
CO2: 29 mmol/L (ref 22–32)
Calcium: 9.1 mg/dL (ref 8.9–10.3)
Chloride: 93 mmol/L — ABNORMAL LOW (ref 98–111)
Creatinine, Ser: 0.63 mg/dL (ref 0.44–1.00)
GFR, Estimated: >60 mL/min
Glucose, Bld: 118 mg/dL — ABNORMAL HIGH (ref 70–99)
Potassium: 3.2 mmol/L — ABNORMAL LOW (ref 3.5–5.1)
Sodium: 133 mmol/L — ABNORMAL LOW (ref 135–145)
Total Bilirubin: 0.6 mg/dL (ref 0.3–1.2)
Total Protein: 5.9 g/dL — ABNORMAL LOW (ref 6.5–8.1)

## 2022-01-27 LAB — COMPREHENSIVE METABOLIC PANEL
ALT: 23 U/L (ref 0–44)
AST: 25 U/L (ref 15–41)
Albumin: 2.5 g/dL — ABNORMAL LOW (ref 3.5–5.0)
Alkaline Phosphatase: 84 U/L (ref 38–126)
Anion gap: 9 (ref 5–15)
BUN: 8 mg/dL (ref 8–23)
CO2: 29 mmol/L (ref 22–32)
Calcium: 9.5 mg/dL (ref 8.9–10.3)
Chloride: 94 mmol/L — ABNORMAL LOW (ref 98–111)
Creatinine, Ser: 0.59 mg/dL (ref 0.44–1.00)
GFR, Estimated: >60 mL/min (ref >60)
Glucose, Bld: 121 mg/dL — ABNORMAL HIGH (ref 70–99)
Potassium: 3.3 mmol/L — ABNORMAL LOW (ref 3.5–5.1)
Sodium: 132 mmol/L — ABNORMAL LOW (ref 135–145)
Total Bilirubin: 0.9 mg/dL (ref 0.3–1.2)
Total Protein: 6 g/dL — ABNORMAL LOW (ref 6.5–8.1)

## 2022-01-27 LAB — CBC
HCT: 30.5 % — ABNORMAL LOW (ref 36.0–46.0)
Hemoglobin: 10.3 g/dL — ABNORMAL LOW (ref 12.0–15.0)
MCH: 32.2 pg (ref 26.0–34.0)
MCHC: 33.8 g/dL (ref 30.0–36.0)
MCV: 95.3 fL (ref 80.0–100.0)
Platelets: 238 10*3/uL (ref 150–400)
RBC: 3.2 MIL/uL — ABNORMAL LOW (ref 3.87–5.11)
RDW: 15.2 % (ref 11.5–15.5)
WBC: 8 10*3/uL (ref 4.0–10.5)
nRBC: 0 % (ref 0.0–0.2)

## 2022-01-27 LAB — URINALYSIS, ROUTINE W REFLEX MICROSCOPIC
Bilirubin Urine: NEGATIVE
Glucose, UA: NEGATIVE mg/dL
Hgb urine dipstick: NEGATIVE
Ketones, ur: NEGATIVE mg/dL
Leukocytes,Ua: NEGATIVE
Nitrite: NEGATIVE
Protein, ur: NEGATIVE mg/dL
Specific Gravity, Urine: 1.015 (ref 1.005–1.030)
pH: 7 (ref 5.0–8.0)

## 2022-01-27 LAB — URINALYSIS, COMPLETE (UACMP) WITH MICROSCOPIC
Bacteria, UA: NONE SEEN
Bilirubin Urine: NEGATIVE
Glucose, UA: NEGATIVE mg/dL
Ketones, ur: 5 mg/dL — AB
Leukocytes,Ua: NEGATIVE
Nitrite: NEGATIVE
Protein, ur: NEGATIVE mg/dL
Specific Gravity, Urine: 1.017 (ref 1.005–1.030)
pH: 5 (ref 5.0–8.0)

## 2022-01-27 LAB — LACTIC ACID, PLASMA: Lactic Acid, Venous: 1.3 mmol/L (ref 0.5–1.9)

## 2022-01-27 LAB — CREATININE, SERUM
Creatinine, Ser: 0.5 mg/dL (ref 0.44–1.00)
GFR, Estimated: >60 mL/min (ref >60)

## 2022-01-27 LAB — CULTURE, BLOOD (ROUTINE X 2): Culture: NO GROWTH

## 2022-01-27 LAB — PROTIME-INR
INR: 1.1 (ref 0.8–1.2)
Prothrombin Time: 14 seconds (ref 11.4–15.2)

## 2022-01-27 LAB — PROCALCITONIN: Procalcitonin: 0.3 ng/mL

## 2022-01-27 MED ORDER — ENOXAPARIN SODIUM 40 MG/0.4ML IJ SOSY: 40.0000 mg | PREFILLED_SYRINGE | INTRAMUSCULAR | Status: DC

## 2022-01-27 MED ORDER — ACETAMINOPHEN 650 MG RE SUPP: 650.0000 mg | Freq: Four times a day (QID) | RECTAL | Status: DC | PRN

## 2022-01-27 MED ORDER — HEPARIN SOD (PORK) LOCK FLUSH 100 UNIT/ML IV SOLN
500.0000 [IU] | Freq: Once | INTRAVENOUS | Status: AC
  Filled 2022-01-27: qty 5

## 2022-01-27 MED ORDER — ORAL CARE MOUTH RINSE: 15.0000 mL | OROMUCOSAL | Status: DC | PRN

## 2022-01-27 MED ORDER — ACETAMINOPHEN 325 MG PO TABS
650.0000 mg | ORAL_TABLET | Freq: Four times a day (QID) | ORAL | Status: DC | PRN
  Administered 2022-01-28 – 2022-01-29 (×2): 650 mg via ORAL
  Filled 2022-01-27 (×2): qty 2

## 2022-01-27 MED ORDER — IOHEXOL 300 MG/ML  SOLN
75.0000 mL | Freq: Once | INTRAMUSCULAR | Status: AC | PRN
  Administered 2022-01-27: 75 mL via INTRAVENOUS

## 2022-01-27 MED ORDER — VANCOMYCIN HCL IN DEXTROSE 1-5 GM/200ML-% IV SOLN
1000.0000 mg | INTRAVENOUS | Status: DC
  Filled 2022-01-27: qty 200

## 2022-01-27 MED ORDER — ONDANSETRON HCL 4 MG PO TABS
4.0000 mg | ORAL_TABLET | Freq: Four times a day (QID) | ORAL | Status: DC | PRN
  Administered 2022-01-29: 4 mg via ORAL
  Filled 2022-01-27: qty 1

## 2022-01-27 MED ORDER — LACTATED RINGERS IV BOLUS (SEPSIS)
1000.0000 mL | Freq: Once | INTRAVENOUS | Status: AC
  Administered 2022-01-27: 1000 mL via INTRAVENOUS

## 2022-01-27 MED ORDER — VANCOMYCIN HCL IN DEXTROSE 1-5 GM/200ML-% IV SOLN
1000.0000 mg | Freq: Once | INTRAVENOUS | Status: AC
  Administered 2022-01-27: 1000 mg via INTRAVENOUS
  Filled 2022-01-27: qty 200

## 2022-01-27 MED ORDER — GUAIFENESIN-DM 100-10 MG/5ML PO SYRP
10.0000 mL | ORAL_SOLUTION | ORAL | Status: DC | PRN
  Administered 2022-01-29 – 2022-02-03 (×7): 10 mL via ORAL
  Filled 2022-01-27 (×7): qty 10

## 2022-01-27 MED ORDER — PANTOPRAZOLE SODIUM 40 MG IV SOLR
40.0000 mg | INTRAVENOUS | Status: DC
  Administered 2022-01-27: 40 mg via INTRAVENOUS
  Filled 2022-01-27: qty 10

## 2022-01-27 MED ORDER — HYDROCODONE-ACETAMINOPHEN 5-325 MG PO TABS: 1.0000 | ORAL_TABLET | ORAL | Status: DC | PRN

## 2022-01-27 MED ORDER — ONDANSETRON HCL 4 MG/2ML IJ SOLN
4.0000 mg | Freq: Four times a day (QID) | INTRAMUSCULAR | Status: DC | PRN
  Administered 2022-01-29 – 2022-02-03 (×10): 4 mg via INTRAVENOUS
  Filled 2022-01-27 (×10): qty 2

## 2022-01-27 MED ORDER — LORAZEPAM 0.5 MG PO TABS
0.5000 mg | ORAL_TABLET | Freq: Once | ORAL | Status: AC
  Administered 2022-01-28: 0.5 mg via ORAL
  Filled 2022-01-27: qty 1

## 2022-01-27 MED ORDER — MORPHINE SULFATE (PF) 2 MG/ML IV SOLN
2.0000 mg | INTRAVENOUS | Status: DC | PRN
  Administered 2022-01-27 – 2022-01-28 (×3): 2 mg via INTRAVENOUS
  Filled 2022-01-27 (×4): qty 1

## 2022-01-27 MED ORDER — PROCHLORPERAZINE EDISYLATE 10 MG/2ML IJ SOLN
10.0000 mg | INTRAMUSCULAR | Status: DC | PRN
  Administered 2022-01-27 – 2022-02-02 (×4): 10 mg via INTRAVENOUS
  Filled 2022-01-27 (×6): qty 2

## 2022-01-27 MED ORDER — SODIUM CHLORIDE 0.9% FLUSH
10.0000 mL | Freq: Once | INTRAVENOUS | Status: AC
  Filled 2022-01-27: qty 10

## 2022-01-27 MED ORDER — ONDANSETRON HCL 4 MG/2ML IJ SOLN
4.0000 mg | Freq: Once | INTRAMUSCULAR | Status: AC
  Administered 2022-01-27: 4 mg via INTRAVENOUS
  Filled 2022-01-27: qty 2

## 2022-01-27 MED ORDER — SODIUM CHLORIDE 0.9 % IV SOLN
1.0000 g | Freq: Three times a day (TID) | INTRAVENOUS | Status: DC
  Administered 2022-01-28: 1 g via INTRAVENOUS
  Filled 2022-01-27: qty 1
  Filled 2022-01-27 (×2): qty 20

## 2022-01-27 MED ORDER — SODIUM CHLORIDE 0.9 % IV SOLN
1.0000 g | Freq: Once | INTRAVENOUS | Status: AC
  Administered 2022-01-27: 1 g via INTRAVENOUS
  Filled 2022-01-27: qty 20

## 2022-01-27 MED ORDER — LACTATED RINGERS IV SOLN: INTRAVENOUS | Status: DC

## 2022-01-27 MED FILL — Dexamethasone Sodium Phosphate Inj 100 MG/10ML: INTRAMUSCULAR | Qty: 1 | Status: AC

## 2022-01-27 NOTE — IPAL (Addendum)
  Interdisciplinary Goals of Care Family Meeting   Date carried out: 01/27/2022  Location of the meeting: Bedside  Member's involved: Physician, Bedside Registered Nurse, and Family Member or next of kin Husband, Sister Amanda Skinner, (daughter Amanda Skinner on phone) Durable Power of Tour manager: patient    Discussion: We discussed goals of care for Amanda Skinner .  We discussed course of illness, worsening metastatic disease, new metastatic lesions, right middle lobe collapse on CT with new pneumonia and recurrence of pleural effusions in spite of pleural tap on 12/1.  Patient states she does not want to be intubated.  After discussing with her daughter on the phone who is a doctor, she was agreeable to BiPAP BiPAP if needed.  States she was leaning towards comfort care.  Would want to continue current therapy and would like morphine if needed to decrease her work of breathing.  She does not want pressors, defibrillation or cardioversion, CPR.  Addendum: Following discussion above, I was called back into the room a couple hours later at 10:43 PM to say that patient was now agreeable to pressors if needed.  Present at bedside were husband and another daughter.  I confirmed with patient that this is what she wanted and she answered in the affirmative  Code status: Limited Code or DNR with short term  Addendum: Pressors and BiPAP only.  NO CODE BLUE call, CPR, defib or cardioversion and no intubation  Disposition: Continue current acute care  Time spent for the meeting: [place], MD  01/27/2022, 8:57 PM

## 2022-01-27 NOTE — Assessment & Plan Note (Addendum)
O2 sat 88% on home flow rate of 3.5.  Patient was on 7 L high flow nasal cannula for most of the hospital stay then had to go up to 12 L.  Yesterday morning was on 9 L.  After thoracentesis was able to go down to 5 L regular nasal cannula..  Patient elects to be DNR/DNI (okay to do pressors and BiPAP if needed).  Need to set up two oxygen concentrator's at home.  Started Solu-Medrol on 01/31/2022.  Prednisone taper prescribed.  Currently on 5 L nasal cannula.  Family has pulse ox at home and can adjust oxygen as needed.

## 2022-01-27 NOTE — Progress Notes (Signed)
PHARMACY -  BRIEF ANTIBIOTIC NOTE   Pharmacy has received consult(s) for vancomycin and meropenem from an ED provider.  The patient's profile has been reviewed for ht/wt/allergies/indication/available labs.    One time order(s) placed for vancomycin 1,000 mg x 1 and meropenem 1 gram x 1  Further antibiotics/pharmacy consults should be ordered by admitting physician if indicated.                       Thank you,  Glean Salvo, PharmD, BCPS Clinical Pharmacist  01/27/2022 5:46 PM

## 2022-01-27 NOTE — Progress Notes (Addendum)
Pharmacy Antibiotic Note  Amanda Skinner is a 61 y.o. female admitted on 01/27/2022 with pneumonia/sepsis. Past medical history includes right breast cancer (2L O2 PTA). Pharmacy has been consulted for vancomycin and meropenem dosing.  Vancomycin 1,000 mg x 1 @1858 , meropenem 1 gram x 1 @1815 . Scr at baseline (< 0.8)  Plan: Vancomycin IV 1,000 mg every 24 hours Goal AUC 400-600 Estimated AUC 506.8, Cmin 12.2 Used IBW, Scr 0.8, and Vd 0.72  Meropenem IV 1 gram every 8 hours  Follow renal function/dose adjustments and cultures.  Height: 4\' 11"  (149.9 cm) Weight: 60 kg (132 lb 4.4 oz) IBW/kg (Calculated) : 43.2  Temp (24hrs), Avg:102 F (38.9 C), Min:101.8 F (38.8 C), Max:102.2 F (39 C)  Recent Labs  Lab 01/21/22 1533 01/21/22 1534 01/21/22 1534 01/22/22 0112 01/22/22 0400 01/23/22 0628 01/24/22 0640 01/27/22 1342 01/27/22 1735  WBC 9.8 9.7  --   --  7.9  --   --  7.5 8.6  CREATININE  --  0.69   < >  --  0.59 0.58 0.60 0.63 0.59  LATICACIDVEN  --   --   --  0.8  --   --   --   --  1.3   < > = values in this interval not displayed.    Estimated Creatinine Clearance: 57.4 mL/min (by C-G formula based on SCr of 0.59 mg/dL).    Allergies  Allergen Reactions   Etodolac Other (See Comments)    Reaction:  Migraines     Antimicrobials this admission: Vancomycin 12/5 >>  Meropenem 12/5 >>   Dose adjustments this admission: N/a  Microbiology results: 12/5 BCx: in process 12/5 MRSA PCR: ordered  Thank you for allowing pharmacy to be a part of this patient's care.   Glean Salvo, PharmD, BCPS Clinical Pharmacist  01/27/2022 8:10 PM

## 2022-01-27 NOTE — Assessment & Plan Note (Addendum)
950 mL removed from thoracentesis on 01/28/2022 underneath the right lung.  Patient had 550 mL of fluid removed underneath the left lung on 01/23/2022.  800 mL removed from underneath the left lung on 01/29/2022.  Will likely need another thoracentesis prior to getting home.

## 2022-01-27 NOTE — Assessment & Plan Note (Addendum)
We will give oral supplementation.  And IV K-Phos today

## 2022-01-27 NOTE — Telephone Encounter (Signed)
Call from [place] on behalf of patient reporting that since last night, patient has a fever of 101, nausea and feels rundown. Patient requesting an appointment in [facility]. Please advise

## 2022-01-27 NOTE — Progress Notes (Signed)
CODE SEPSIS - PHARMACY COMMUNICATION  **Broad Spectrum Antibiotics should be administered within 1 hour of Sepsis diagnosis**  Time Code Sepsis Called/Page Received: 1744  Antibiotics Ordered: vancomycin 1,000 mg x 1 and meropenem 1 gram x 1  Time of 1st antibiotic administration: 5625  Additional action taken by pharmacy: n/a  If necessary, Name of Provider/Nurse Contacted: none    Glean Salvo, PharmD, BCPS Clinical Pharmacist  01/27/2022 5:47 PM

## 2022-01-27 NOTE — Progress Notes (Signed)
Elink following code sepsis °

## 2022-01-27 NOTE — Progress Notes (Signed)
       CROSS COVER NOTE  NAME: Amanda Skinner MRN: [id number] DOB : 08-Nov-1960 ATTENDING PHYSICIAN: Athena Masse, MD    Date of Service   01/27/2022   HPI/Events of Note   Requesting  Interventions   Assessment/Plan: Ativan 0.5mg  X X      This document was prepared using Dragon voice recognition software and may include unintentional dictation errors.  Neomia Glass DNP, MBA, FNP-BC Nurse Practitioner [facility] Pager [phone]

## 2022-01-27 NOTE — Telephone Encounter (Signed)
Please schedule patient for port labs and to see Josh today. Please notify patient of appt. Thanks
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
## 2022-01-27 NOTE — Assessment & Plan Note (Deleted)
Postobstructive pneumonia with right middle lobe collapse/HCAP Sepsis Acute on chronic hypoxic respiratory failure Sepsis criteria includes fever, tachycardia, hypoxia to 88 on baseline O2 of 3.5 L, pneumonia Sepsis fluids Meropenem and vancomycin Follow blood cultures Antitussives, incentive spirometry and supportive care Supplemental oxygen to keep sats over 94%.  Currently on 7 L at the time of admission Patient open to HFNC, BiPAP and does not want to be intubated States she will be prefer morphine for comfort if she worsens

## 2022-01-27 NOTE — Assessment & Plan Note (Signed)
Albuterol as needed 

## 2022-01-27 NOTE — ED Triage Notes (Signed)
Has port accessed.

## 2022-01-27 NOTE — ED Triage Notes (Signed)
Recent hospitalization with pneumonia.  Still running fever on and off, sohort of breath and cough.  Says weakness.  Is almost finished with abt.

## 2022-01-27 NOTE — Progress Notes (Signed)
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 completely ambulatory  Review of Systems Unless otherwise noted, a complete review of systems is negative.  Physical Exam General: NAD Cardiovascular: Tachycardia Pulmonary: Exertionally labored Abdomen: soft, nontender, + bowel sounds GU: no suprapubic tenderness Extremities: no edema, no joint deformities Skin: no rashes Neurological: Weakness but otherwise nonfocal  IMPRESSION/PLAN: Pneumonia -patient febrile in clinic with increased O2 requirements (now on 4 L O2).  She is hypotensive and tachycardic.  Overall, she appears to be doing poorly.  We discussed her overall goals.  Unfortunately, patient cannot initiate chemotherapy as scheduled for tomorrow.  We discussed options for hospitalization versus outpatient management versus hospice.  Ultimately, patient opted to transfer back to the emergency department for rehospitalization.  We discussed CODE STATUS and patient is unsure of her wishes at this time. Report was called to ED triage/charge nurse.   Case and plan discussed with Dr. [name] who also saw patient today.   Patient expressed understanding and  was in agreement with this plan. She also understands that She can call clinic at any time with any questions, concerns, or complaints.   Thank you for allowing me to participate in the care of this very pleasant patient.   Time Total: 25 minutes  Visit consisted of counseling and education dealing with the complex and emotionally intense  issues of symptom management in the setting of serious illness.Greater than 50%  of this time was spent counseling and coordinating care related to the above assessment and plan.  Signed by: Altha Harm, PhD, NP-C

## 2022-01-27 NOTE — ED Provider Notes (Signed)
[facility] Provider Note    Event Date/Time   First MD Initiated Contact with Patient 01/27/22 1726     (approximate)   History   Fever and Shortness of Breath   HPI  Amanda Skinner is a 61 y.o. female who was in the hospital recently with worsening lung mets from her breast cancer and an effusion and multifocal pneumonia who is now febrile again with increasing weakness and shortness of breath.      Physical Exam   Triage Vital Signs: ED Triage Vitals  Enc Vitals Group     BP 01/27/22 1703 124/67     Pulse --      Resp 01/27/22 1703 18     Temp 01/27/22 1703 (!) 102.2 F (39 C)     Temp src --      SpO2 01/27/22 1703 (!) 88 %     Weight 01/27/22 1704 132 lb 4.4 oz (60 kg)     Height 01/27/22 1704 4\' 11"  (1.499 m)     Head Circumference --      Peak Flow --      Pain Score 01/27/22 1704 6     Pain Loc --      Pain Edu? --      Excl. in [place]? --     Most recent vital signs: Vitals:   01/27/22 1743 01/27/22 1743  BP:    Pulse: (!) 133   Resp:    Temp:    SpO2:  93%    General: Awake, no distress.  CV:  Good peripheral perfusion.  Heart regular rate and rhythm but very tacky Resp:  Somewhat increased effort somewhat decreased breath sounds bilaterally Abd:  No distention.  Soft and nontender Extremities: No edema   ED Results / Procedures / Treatments   Labs (all labs ordered are listed, but only abnormal results are displayed) Labs Reviewed  COMPREHENSIVE METABOLIC PANEL - Abnormal; Notable for the following components:      Result Value   Sodium 132 (*)    Potassium 3.3 (*)    Chloride 94 (*)    Glucose, Bld 121 (*)    Total Protein 6.0 (*)    Albumin 2.5 (*)    All other components within normal limits  CBC WITH DIFFERENTIAL/PLATELET - Abnormal; Notable for the following components:   RBC 3.44 (*)    Hemoglobin 11.1 (*)    HCT 32.7 (*)    Lymphs Abs 0.3 (*)    Abs Immature Granulocytes 0.08 (*)    All other  components within normal limits  CULTURE, BLOOD (ROUTINE X 2)  CULTURE, BLOOD (ROUTINE X 2)  LACTIC ACID, PLASMA  PROTIME-INR  PROCALCITONIN  LACTIC ACID, PLASMA  URINALYSIS, ROUTINE W REFLEX MICROSCOPIC     EKG  EKG read and interpreted by me shows sinus tachycardia rate of 135 normal axis no acute ST-T changes   RADIOLOGY Chest x-ray read by radiology reviewed and interpreted by me shows worsening bibasilar opacities possible infiltrate or metastatic disease or effusions. CT shows worsening metastatic disease and new infectious disease.  Patient hypoxic particular in the hospital. PROCEDURES:  Critical Care performed: Critical care time 20 minutes.  This includes reviewing the patient's old records talking with the patient and family talking to the hospitalist reviewing the patient's studies.  Procedures   MEDICATIONS ORDERED IN ED: Medications  lactated ringers infusion (has no administration in time range)  vancomycin (VANCOCIN) IVPB 1000 mg/200 mL premix (has  no administration in time range)  lactated ringers bolus 1,000 mL (1,000 mLs Intravenous New Bag/Given 01/27/22 1816)    And  lactated ringers bolus 1,000 mL (1,000 mLs Intravenous New Bag/Given 01/27/22 1816)  meropenem (MERREM) 1 g in sodium chloride 0.9 % 100 mL IVPB (1 g Intravenous New Bag/Given 01/27/22 1815)  iohexol (OMNIPAQUE) 300 MG/ML solution 75 mL (75 mLs Intravenous Contrast Given 01/27/22 1805)     IMPRESSION / MDM / ASSESSMENT AND PLAN / ED COURSE  I reviewed the triage vital signs and the nursing notes. CT shows worsening metastatic disease and infectious disease with marked hypoxia now.  This is now a hospital associated pneumonia.    Patient's presentation is most consistent with acute presentation with potential threat to life or bodily function.  The patient is on the cardiac monitor to evaluate for evidence of arrhythmia and/or significant heart rate changes.  Sinus tach was seen.       FINAL CLINICAL IMPRESSION(S) / ED DIAGNOSES   Final diagnoses:  Dyspnea, unspecified type  Hypoxia  Hospital-acquired pneumonia     Rx / DC Orders   ED Discharge Orders     None        Note:  This document was prepared using Dragon voice recognition software and may include unintentional dictation errors.   Nena Polio, MD 01/27/22 [phone]

## 2022-01-27 NOTE — Assessment & Plan Note (Signed)
Clear liquid diet IV hydration Zofran with Compazine for refractory nausea

## 2022-01-27 NOTE — Assessment & Plan Note (Addendum)
Sodium in the normal range

## 2022-01-27 NOTE — Telephone Encounter (Signed)
Can you please see her today?

## 2022-01-28 ENCOUNTER — Other Ambulatory Visit: Payer: Self-pay | Admitting: Oncology

## 2022-01-28 ENCOUNTER — Ambulatory Visit: Payer: 59

## 2022-01-28 ENCOUNTER — Inpatient Hospital Stay: Payer: 59

## 2022-01-28 ENCOUNTER — Inpatient Hospital Stay: Payer: 59 | Admitting: Oncology

## 2022-01-28 DIAGNOSIS — R110 Nausea: Secondary | ICD-10-CM

## 2022-01-28 DIAGNOSIS — R0902 Hypoxemia: Secondary | ICD-10-CM

## 2022-01-28 DIAGNOSIS — J189 Pneumonia, unspecified organism: Secondary | ICD-10-CM | POA: Diagnosis not present

## 2022-01-28 DIAGNOSIS — J910 Malignant pleural effusion: Secondary | ICD-10-CM

## 2022-01-28 DIAGNOSIS — C50411 Malignant neoplasm of upper-outer quadrant of right female breast: Secondary | ICD-10-CM

## 2022-01-28 DIAGNOSIS — J4520 Mild intermittent asthma, uncomplicated: Secondary | ICD-10-CM

## 2022-01-28 DIAGNOSIS — E871 Hypo-osmolality and hyponatremia: Secondary | ICD-10-CM

## 2022-01-28 DIAGNOSIS — J9621 Acute and chronic respiratory failure with hypoxia: Secondary | ICD-10-CM | POA: Diagnosis not present

## 2022-01-28 DIAGNOSIS — Z170 Estrogen receptor positive status [ER+]: Secondary | ICD-10-CM

## 2022-01-28 DIAGNOSIS — R0600 Dyspnea, unspecified: Secondary | ICD-10-CM

## 2022-01-28 DIAGNOSIS — E876 Hypokalemia: Secondary | ICD-10-CM

## 2022-01-28 LAB — URINE CULTURE: Culture: NO GROWTH

## 2022-01-28 LAB — RESPIRATORY PANEL BY PCR

## 2022-01-28 LAB — CBC
HCT: 29.5 % — ABNORMAL LOW (ref 36.0–46.0)
Hemoglobin: 10 g/dL — ABNORMAL LOW (ref 12.0–15.0)
MCH: 32.1 pg (ref 26.0–34.0)
MCHC: 33.9 g/dL (ref 30.0–36.0)
MCV: 94.6 fL (ref 80.0–100.0)
Platelets: 248 10*3/uL (ref 150–400)
RBC: 3.12 MIL/uL — ABNORMAL LOW (ref 3.87–5.11)
RDW: 15.3 % (ref 11.5–15.5)
WBC: 7.9 10*3/uL (ref 4.0–10.5)
nRBC: 0 % (ref 0.0–0.2)

## 2022-01-28 LAB — BODY FLUID CELL COUNT WITH DIFFERENTIAL
Eos, Fluid: 0 %
Lymphs, Fluid: 32 %
Monocyte-Macrophage-Serous Fluid: 65 %
Neutrophil Count, Fluid: 3 %
Total Nucleated Cell Count, Fluid: 123 cu mm

## 2022-01-28 LAB — PROTIME-INR
INR: 1.1 (ref 0.8–1.2)
Prothrombin Time: 14 seconds (ref 11.4–15.2)

## 2022-01-28 LAB — BASIC METABOLIC PANEL
Anion gap: 6 (ref 5–15)
BUN: 6 mg/dL — ABNORMAL LOW (ref 8–23)
CO2: 32 mmol/L (ref 22–32)
Calcium: 9 mg/dL (ref 8.9–10.3)
Chloride: 94 mmol/L — ABNORMAL LOW (ref 98–111)
Creatinine, Ser: 0.5 mg/dL (ref 0.44–1.00)
GFR, Estimated: >60 mL/min (ref >60)
Glucose, Bld: 128 mg/dL — ABNORMAL HIGH (ref 70–99)
Potassium: 3.3 mmol/L — ABNORMAL LOW (ref 3.5–5.1)
Sodium: 132 mmol/L — ABNORMAL LOW (ref 135–145)

## 2022-01-28 LAB — RESP PANEL BY RT-PCR (FLU A&B, COVID) ARPGX2
Influenza A by PCR: NEGATIVE
Influenza B by PCR: NEGATIVE
SARS Coronavirus 2 by RT PCR: NEGATIVE

## 2022-01-28 LAB — MRSA NEXT GEN BY PCR, NASAL: MRSA by PCR Next Gen: NOT DETECTED

## 2022-01-28 LAB — LACTATE DEHYDROGENASE, PLEURAL OR PERITONEAL FLUID: LD, Fluid: 294 U/L — ABNORMAL HIGH (ref 3–23)

## 2022-01-28 LAB — GLUCOSE, PLEURAL OR PERITONEAL FLUID: Glucose, Fluid: 127 mg/dL

## 2022-01-28 LAB — PROTEIN, PLEURAL OR PERITONEAL FLUID: Total protein, fluid: 3.2 g/dL

## 2022-01-28 LAB — CORTISOL-AM, BLOOD: Cortisol - AM: 21.4 ug/dL (ref 6.7–22.6)

## 2022-01-28 LAB — PROCALCITONIN: Procalcitonin: 0.27 ng/mL

## 2022-01-28 MED ORDER — MORPHINE SULFATE (PF) 2 MG/ML IV SOLN
2.0000 mg | INTRAVENOUS | Status: DC | PRN
  Administered 2022-01-28 – 2022-02-03 (×15): 2 mg via INTRAVENOUS
  Filled 2022-01-28 (×14): qty 1

## 2022-01-28 MED ORDER — LEVALBUTEROL HCL 0.63 MG/3ML IN NEBU: 0.6300 mg | INHALATION_SOLUTION | Freq: Four times a day (QID) | RESPIRATORY_TRACT | Status: DC | PRN

## 2022-01-28 MED ORDER — POTASSIUM CHLORIDE 20 MEQ PO PACK
20.0000 meq | PACK | Freq: Once | ORAL | Status: AC
  Administered 2022-01-28: 20 meq via ORAL
  Filled 2022-01-28: qty 1

## 2022-01-28 MED ORDER — LORAZEPAM 0.5 MG PO TABS
0.5000 mg | ORAL_TABLET | ORAL | Status: DC | PRN
  Administered 2022-01-29 – 2022-02-03 (×9): 0.5 mg via ORAL
  Filled 2022-01-28 (×9): qty 1

## 2022-01-28 MED ORDER — BENZONATATE 100 MG PO CAPS
100.0000 mg | ORAL_CAPSULE | Freq: Four times a day (QID) | ORAL | Status: DC
  Administered 2022-01-28 – 2022-02-02 (×21): 100 mg via ORAL
  Filled 2022-01-28 (×23): qty 1

## 2022-01-28 MED ORDER — FLUTICASONE PROPIONATE 50 MCG/ACT NA SUSP
2.0000 | Freq: Every day | NASAL | Status: DC
  Administered 2022-01-28 – 2022-02-03 (×7): 2 via NASAL
  Filled 2022-01-28: qty 16

## 2022-01-28 MED ORDER — PANTOPRAZOLE SODIUM 40 MG IV SOLR
40.0000 mg | Freq: Every day | INTRAVENOUS | Status: DC
  Administered 2022-01-28 – 2022-02-02 (×6): 40 mg via INTRAVENOUS
  Filled 2022-01-28 (×6): qty 10

## 2022-01-28 MED ORDER — PIPERACILLIN-TAZOBACTAM 3.375 G IVPB
3.3750 g | Freq: Three times a day (TID) | INTRAVENOUS | Status: DC
  Administered 2022-01-28 – 2022-01-30 (×7): 3.375 g via INTRAVENOUS
  Filled 2022-01-28 (×7): qty 50

## 2022-01-28 MED ORDER — GABAPENTIN 100 MG PO CAPS
100.0000 mg | ORAL_CAPSULE | Freq: Three times a day (TID) | ORAL | Status: DC
  Administered 2022-01-28 – 2022-02-03 (×13): 100 mg via ORAL
  Filled 2022-01-28 (×17): qty 1

## 2022-01-28 MED ORDER — MORPHINE SULFATE (CONCENTRATE) 10 MG/0.5ML PO SOLN
5.0000 mg | ORAL | Status: DC | PRN
  Administered 2022-01-28 – 2022-01-31 (×7): 5 mg via ORAL
  Filled 2022-01-28 (×7): qty 0.5

## 2022-01-28 MED ORDER — MIRTAZAPINE 15 MG PO TABS
7.5000 mg | ORAL_TABLET | Freq: Every day | ORAL | Status: DC
  Administered 2022-01-28 – 2022-02-01 (×5): 7.5 mg via ORAL
  Filled 2022-01-28 (×5): qty 1

## 2022-01-28 MED ORDER — LIDOCAINE HCL (PF) 1 % IJ SOLN
10.0000 mL | Freq: Once | INTRAMUSCULAR | Status: AC
  Administered 2022-01-28: 10 mL via INTRADERMAL

## 2022-01-28 MED ORDER — SALINE SPRAY 0.65 % NA SOLN
2.0000 | NASAL | Status: DC | PRN
  Administered 2022-01-29: 2 via NASAL
  Filled 2022-01-28: qty 44

## 2022-01-28 MED ORDER — POTASSIUM CHLORIDE CRYS ER 20 MEQ PO TBCR
20.0000 meq | EXTENDED_RELEASE_TABLET | Freq: Once | ORAL | Status: DC
  Filled 2022-01-28: qty 1

## 2022-01-28 MED ORDER — SUCRALFATE 1 GM/10ML PO SUSP
1.0000 g | Freq: Three times a day (TID) | ORAL | Status: DC
  Administered 2022-01-28 – 2022-02-03 (×15): 1 g via ORAL
  Filled 2022-01-28 (×17): qty 10

## 2022-01-28 MED ORDER — SODIUM CHLORIDE 0.9 % IV SOLN: INTRAVENOUS | Status: DC

## 2022-01-28 MED ORDER — SODIUM CHLORIDE 0.9 % IV BOLUS
500.0000 mL | Freq: Once | INTRAVENOUS | Status: AC
  Administered 2022-01-28: 500 mL via INTRAVENOUS

## 2022-01-28 MED ORDER — CHLORHEXIDINE GLUCONATE CLOTH 2 % EX PADS
6.0000 | MEDICATED_PAD | Freq: Every day | CUTANEOUS | Status: DC
  Administered 2022-01-28 – 2022-02-03 (×7): 6 via TOPICAL

## 2022-01-28 MED ORDER — MORPHINE SULFATE (CONCENTRATE) 10 MG /0.5 ML PO SOLN: 5.0000 mg | ORAL | Status: DC | PRN

## 2022-01-28 MED ORDER — SODIUM CHLORIDE 0.9 % IV SOLN: INTRAVENOUS | Status: DC | PRN

## 2022-01-28 MED ORDER — DOCUSATE SODIUM 50 MG/5ML PO LIQD
100.0000 mg | Freq: Two times a day (BID) | ORAL | Status: DC
  Administered 2022-01-28: 100 mg via ORAL
  Filled 2022-01-28 (×4): qty 10

## 2022-01-28 MED ORDER — ENOXAPARIN SODIUM 40 MG/0.4ML IJ SOSY
40.0000 mg | PREFILLED_SYRINGE | INTRAMUSCULAR | Status: DC
  Administered 2022-01-28 – 2022-02-02 (×6): 40 mg via SUBCUTANEOUS
  Filled 2022-01-28 (×6): qty 0.4

## 2022-01-28 MED ORDER — LORATADINE 10 MG PO TABS
5.0000 mg | ORAL_TABLET | Freq: Every day | ORAL | Status: DC
  Administered 2022-01-28 – 2022-02-03 (×7): 5 mg via ORAL
  Filled 2022-01-28 (×7): qty 1

## 2022-01-28 MED ORDER — PANTOPRAZOLE SODIUM 40 MG PO TBEC: 40.0000 mg | DELAYED_RELEASE_TABLET | Freq: Every day | ORAL | Status: DC

## 2022-01-28 MED ORDER — MENTHOL 3 MG MT LOZG
1.0000 | LOZENGE | OROMUCOSAL | Status: DC | PRN
  Administered 2022-01-28 – 2022-01-29 (×4): 3 mg via ORAL
  Filled 2022-01-28 (×2): qty 9

## 2022-01-28 NOTE — Hospital Course (Addendum)
61 year old female with past medical history of metastatic breast cancer to bone, lungs, lung pleura and lymph nodes.  She was recently started on oxygen as outpatient prior to that admission.  She was recently in the hospital from 11/29 to 12/3 and treated for pneumonia.  Patient readmitted on 01/27/2022 with pneumonia and pleural effusions.  Thoracentesis on 01/28/2022 remove 950 mL underneath the right lung.  Thoracentesis on 12/7 removed 800 mL underneath the left lung.  The patient was having fevers at night but temperature curve coming down.  Antibiotics changed from Zosyn over to Unasyn.  Patient had left shoulder pain on 01/31/2022.  Patient will have a steroid injection in the shoulder.  Started on systemic steroids on 01/31/2022 for wheezing.  02/02/2022.  MRI last night showing of brain metastases.  Patient and family wanted to speak with palliative care and oncology to discuss options.  If they decide no further treatments then would be a candidate for hospice. 02/03/2022.  Family decided on home with hospice.  They have decided on Pleurx catheter placement.  IR placed a left Pleurx catheter and also removed 500 mL of fluid.  As per transitional care team, the hospice nurse can drain from the Pleurx catheter.  Recommend draining every 3 or 4 days or more often every other day if worsening shortness of breath.  Case discussed with [facility] and they do have somebody in the cancer center that can drain from the Pleurx catheter if the hospice nurses unsuccessful.  I advised the patient and family that if they wanted a right Pleurx catheter that [facility] can set this up as outpatient.  Patient switched over to Augmentin for discharge.  Patient will also go home on prednisone taper and Diflucan for thrush.

## 2022-01-28 NOTE — Progress Notes (Signed)
Called with red mews.  Patient with fever of 102, tachycardia and tachypnea.  Patient breathing a little bit better after 950 mL removed underneath the right lung.  Continue Zosyn.  We will give IV fluid bolus and maintenance fluids.  Lung exam, decreased breath sounds bilateral bases with rhonchi.  Better air entry mid right lung.

## 2022-01-28 NOTE — Progress Notes (Signed)
   01/28/22 1709  Assess: MEWS Score  Temp (!) 102.1 F (38.9 C)  BP 135/76  MAP (mmHg) 90  Pulse Rate (!) 129  SpO2 90 %  O2 Device Nasal Cannula  O2 Flow Rate (L/min) 7 L/min  Assess: MEWS Score  MEWS Temp 2  MEWS Systolic 0  MEWS Pulse 2  MEWS RR 0  MEWS LOC 0  MEWS Score 4  MEWS Score Color Red  Assess: if the MEWS score is Yellow or Red  Were vital signs taken at a resting state? Yes  Focused Assessment No change from prior assessment  Does the patient meet 2 or more of the SIRS criteria? Yes  Does the patient have a confirmed or suspected source of infection? Yes  Provider and Rapid Response Notified? Yes  MEWS guidelines implemented *See Row Information* No, previously red, continue vital signs every 4 hours  Assess: SIRS CRITERIA  SIRS Temperature  1  SIRS Pulse 1  SIRS Respirations  0  SIRS WBC 1  SIRS Score Sum  3

## 2022-01-28 NOTE — Procedures (Signed)
PROCEDURE SUMMARY:  Successful image-guided right thoracentesis. Yielded 950 mL of clear yellow fluid. Pt tolerated procedure well. No immediate complications. EBL = trace   Specimen was sent for labs. CXR ordered.  Please see imaging section of Epic for full dictation.  Lura Em PA-C 01/28/2022 11:22 AM

## 2022-01-28 NOTE — Plan of Care (Signed)

## 2022-01-28 NOTE — Assessment & Plan Note (Addendum)
Patient recently received Rocephin and Zithromax.  Patient was started on vancomycin and meropenem initially.  Since MRSA PCR is negative vancomycin was discontinued.  Zosyn switched over to Unasyn on 01/30/2022.  Switch to Augmentin on 02/02/2022 and prescribe on discharge.

## 2022-01-28 NOTE — Consult Note (Signed)
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 the right and the left. Results were shared with the patient who deferred thoracentesis at this time. IMPRESSION: Small bilateral pleural effusions. Electronically Signed   By: Corrie Mckusick D.O.   On: 01/14/2022 16:26   DG Shoulder Left  Result Date: 01/14/2022 CLINICAL DATA:  61 year old female with sudden onset sharp pain radiating to the arm pit and chest. Metastatic breast cancer. EXAM: LEFT SHOULDER - 2+ VIEW COMPARISON:  Left humerus series today. Recent chest radiographs 01/12/2022. FINDINGS: No glenohumeral joint dislocation. Proximal left humerus appears intact. Bone mineralization is within normal limits for age. No acute osseous abnormality identified. Left chest power port and pleural effusion do not appear significantly changed from 01/12/2022. Visible left ribs appear grossly intact. IMPRESSION: 1. No osseous abnormality identified about the left shoulder. 2. Left pleural effusion not significantly  changed from recent 01/12/2022 radiographs. Electronically Signed   By: Herminio Heads.D.  On: 01/14/2022 10:29   DG Humerus Left  Result Date: 01/14/2022 CLINICAL DATA:  61 year old female with sudden onset sharp pain radiating to the arm pit and chest. EXAM: LEFT HUMERUS - 2+ VIEW COMPARISON:  Left forearm series today. FINDINGS: Bone mineralization is within normal limits. Alignment appears grossly maintained at the left shoulder and elbow. There is no evidence of fracture or other focal bone lesions. Soft tissues are unremarkable. Partially visible left lung base veiling opacity suggesting pleural effusion. Left chest power port partially visible. IMPRESSION: 1. No osseous abnormality identified about the left humerus. 2. Partially visible left pleural effusion, Port-A-Cath. Electronically Signed   By: Genevie Ann M.D.   On: 01/14/2022 10:26   DG Forearm Left  Result Date: 01/14/2022 CLINICAL DATA:  61 year old female with sudden onset sharp pain radiating to the arm pit and chest. EXAM: LEFT FOREARM - 2 VIEW COMPARISON:  None Available. FINDINGS: Bone mineralization is within normal limits. There is no evidence of fracture or other focal bone lesions. No joint effusion is evident at the left elbow. Soft tissue contours appear within normal limits. IMPRESSION: Negative. Electronically Signed   By: Genevie Ann M.D.   On: 01/14/2022 10:25   DG Chest 2 View  Result Date: 01/12/2022 CLINICAL DATA:  Shortness of breath. History of pleural effusion. History of breast cancer. EXAM: CHEST - 2 VIEW COMPARISON:  Chest two views 01/02/2022 and 01/01/2022; CT chest abdomen and pelvis 11/27/2021 FINDINGS: Cardiac silhouette and mediastinal contours are within normal limits. Left chest wall porta catheter tip overlies the central superior vena cava. Mild-to-moderate left and trace right pleural effusions are not significantly changed. Approximately 2 cm left lateral midlung nodular density appears unchanged from 01/01/2022  frontal radiograph. The more accurate measurement on CT on 11/27/2021 was up to approximately 1.2 cm. Of the right mid to lower lung smaller nodules as better seen on prior CT. Posteroinferior opacity on lateral view corresponding to the dominant 4 cm posteroinferior left lower lobe mass on prior CT. No pneumothorax.  Mild bilateral lower lung interstitial thickening. Moderate multilevel degenerative disc changes of the thoracic spine. IMPRESSION: 1. Mild-to-moderate left and trace right pleural effusions are not significantly changed from 01/02/2022. 2. Posteroinferior opacity on lateral view corresponding to the dominant 4 cm posteroinferior left lower lobe mass on prior CT. Additional bilateral pulmonary nodules again consistent with pulmonary metastases. Electronically Signed   By: Yvonne Kendall M.D.   On: 01/12/2022 16:37   MR Brain W Wo Contrast  Result Date: 01/07/2022 CLINICAL DATA:  Evaluate for metastatic disease. Breast cancer staging EXAM: MRI HEAD WITHOUT AND WITH CONTRAST TECHNIQUE: Multiplanar, multiecho pulse sequences of the brain and surrounding structures were obtained without and with intravenous contrast. CONTRAST:  19m GADAVIST GADOBUTROL 1 MMOL/ML IV SOLN COMPARISON:  06/18/2021 FINDINGS: Brain: No enhancement or swelling to suggest metastatic disease. Mild chronic small vessel ischemia in the cerebral white matter and pons. No acute or subacute infarct, hemorrhage, hydrocephalus, or collection Vascular: Normal flow voids and vascular enhancements Skull and upper cervical spine: Posterior scalp nodule closely associated with the right para median calvarium, see 16:113, not seen on priors but also not hypermetabolic on recent PET CT. The nodule measures 10 x 3 mm, attention on follow-up. Sinuses/Orbits: Negative IMPRESSION: 1. Negative for metastatic disease to the brain. 2. New, small posterior scalp nodule which would be concerning for a metastatic deposit but was not hypermetabolic on  recent PET, question recent trauma. Electronically Signed   By: JRoderic Palau  Watts M.D.   On: 01/07/2022 12:38   US THORACENTESIS ASP PLEURAL SPACE W/IMG GUIDE  Result Date: 01/02/2022 INDICATION: Left pleural effusion EXAM: ULTRASOUND GUIDED LEFT THORACENTESIS MEDICATIONS: 8 cc 1% lidocaine COMPLICATIONS: None immediate. PROCEDURE: An ultrasound guided thoracentesis was thoroughly discussed with the patient and questions answered. The benefits, risks, alternatives and complications were also discussed. The patient understands and wishes to proceed with the procedure. Written consent was obtained. Ultrasound was performed to localize and mark an adequate pocket of fluid in the left chest. The area was then prepped and draped in the normal sterile fashion. 1% Lidocaine was used for local anesthesia. Under ultrasound guidance a 6 Fr Safe-T-Centesis catheter was introduced. Thoracentesis was performed. The catheter was removed and a dressing applied. FINDINGS: A total of approximately 450 cc of yellow fluid was removed. Samples were sent to the laboratory as requested by the clinical team. IMPRESSION: Successful ultrasound guided left thoracentesis yielding 450 cc of pleural fluid. Follow-up chest x-ray revealed no evidence of pneumothorax. Read by: Reatha Armour, PA-C Electronically Signed   By: Aletta Edouard M.D.   On: 01/02/2022 13:18   DG Chest Port 1 View  Result Date: 01/02/2022 CLINICAL DATA:  Status post left thoracentesis. EXAM: PORTABLE CHEST 1 VIEW COMPARISON:  January 01, 2022. FINDINGS: No pneumothorax is noted status post left thoracentesis. Left pleural effusion may be slightly decreased. IMPRESSION: No pneumothorax status post left thoracentesis. Electronically Signed   By: Marijo Conception M.D.   On: 01/02/2022 12:29   DG Chest 2 View  Result Date: 01/01/2022 CLINICAL DATA:  Shortness of breath. Cough. EXAM: CHEST - 2 VIEW COMPARISON:  Chest CT 11/27/2021 FINDINGS: Accessed left chest port  in place. Heart size grossly normal. Known mediastinal adenopathy on CT not well seen by radiograph. There is a left pleural effusion that is new from prior exam, left lower lobe mass noted on recent CT. Nodule in the left mid lung measures 2.1 cm, increased. There is new blunting of the right costophrenic angle suggesting small effusion. Multiple pulmonary nodules in the right lung which were better delineated on recent CT. No pneumothorax. On limited assessment, no acute osseous findings. IMPRESSION: 1. Bilateral pleural effusions, left greater than right, new from prior exam. 2. Enlargement of left lung pulmonary nodule. Multiple right-sided pulmonary nodules better delineated on recent CT. 3. Known mediastinal adenopathy not well seen by radiograph. Electronically Signed   By: Keith Rake M.D.   On: 01/01/2022 17:20    Assessment and plan- Patient is a 61 y.o. female with metastatic triple negative breast cancer admitted for acute on chronic hypoxic respiratory failure and fever  Fever: Possibly secondary to postobstructive pneumonia.  It is also possible that she has tumor fever.  She was recently discharged after receiving about 4 days of IV antibiotics and was on oral antibiotics.  She was supposed to start chemotherapy as an outpatient but because of fever she has been readmitted.  If her workup again is not suggestive of any acute infectious etiology it is possible that she has tumor fever.  It is unlikely that her condition will improve without any systemic treatment.  Hopefully she can be optimized over the weekend to be discharged and receive chemotherapy next week.  Prognosis is guarded.  Palliative care is on board    Visit Diagnosis 1. Dyspnea, unspecified type   2. Hypoxia   3. Hospital-acquired pneumonia   4. Status post thoracentesis     Dr. Randa Evens, MD, MPH  [place] [facility] [phone] 01/28/2022

## 2022-01-28 NOTE — Progress Notes (Signed)
  Transition of Care (TOC) Screening Note   Patient Details  Name: Amanda Skinner Date of Birth: October 16, 1960   Transition of Care [facility] CM/SW Contact:    Quin Hoop, LCSW Phone Number: 01/28/2022, 10:56 AM    Transition of Care Department [facility]) has reviewed patient and no TOC needs have been identified at this time. We will continue to monitor patient advancement through interdisciplinary progression rounds. If new patient transition needs arise, please place a TOC consult.

## 2022-01-28 NOTE — Progress Notes (Signed)
Progress Note   Patient: Amanda Skinner EHM:[id number] DOB: Jun 22, 1960 DOA: 01/27/2022     1 DOS: the patient was seen and examined on 01/28/2022   Brief hospital course: 61 year old female with past medical history of metastatic breast cancer to bone, lungs, lung pleura and lymph nodes.  She was recently started on oxygen as outpatient prior to that admission.  She was recently in the hospital from 11/29 to 12/3 and treated for pneumonia.  Patient readmitted on 01/27/2022 with pneumonia.  Thoracentesis on 01/28/2022 remove 950 mL underneath the right lung.  Assessment and Plan: * Acute on chronic respiratory failure with hypoxia (HCC) O2 sat 88% on home flow rate of 3.5, now on bubble high flow nasal cannula 6 L. Patient elects to be DNR/DNI (okay to do pressors and BiPAP if needed).    Multifocal pneumonia Patient recently received Rocephin and Zithromax.  Patient was started on vancomycin and meropenem initially.  Since MRSA PCR is negative vancomycin was discontinued.  Meropenem switched over to Zosyn.  Procalcitonin not that elevated.  Viral respiratory panel sent off and still pending.  Sepsis ruled out.  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  Patient had 550 mL of fluid removed underneath the left lung on 01/23/2022.  Malignant neoplasm of upper-outer quadrant of right breast in female, estrogen receptor positive ([place]) Patient has widespread metastatic disease mainly with lung nodules, mediastinal and hilar adenopathy.  Patient also has bilateral pleural effusions. Worsening pulmonary mets on CT and new metastasis on spleen seen Case discussed with Dr. [name] oncology to see patient.   Hyponatremia Likely related to malignancy.  Hypokalemia We will give oral supplementation.  Nausea As needed nausea medication.  Mild intermittent asthma Albuterol as needed        Subjective: Patient was recently discharged  from the hospital for pneumonia.  Coming back with worsening shortness of breath.  Patient started on meropenem and vancomycin initially.  Patient seen this morning prior to thoracentesis.  Patient complains of cough and also would like some Cepacol throat lozenges.  Physical Exam: Vitals:   01/28/22 0909 01/28/22 1048 01/28/22 1136 01/28/22 1239  BP: 119/67 133/63 125/82 109/65  Pulse: (!) 112 (!) 116 (!) 114 (!) 114  Resp: 20   18  Temp: 98.2 F (36.8 C)   98.9 F (37.2 C)  TempSrc: Oral   Oral  SpO2: 93% 91% 92% 93%  Weight: 64.7 kg     Height:       Physical Exam HENT:     Head: Normocephalic.     Mouth/Throat:     Pharynx: No oropharyngeal exudate.  Eyes:     General: Lids are normal.     Conjunctiva/sclera: Conjunctivae normal.  Cardiovascular:     Rate and Rhythm: Normal rate and regular rhythm.     Heart sounds: Normal heart sounds, S1 normal and S2 normal.  Pulmonary:     Breath sounds: Examination of the right-middle field reveals decreased breath sounds. Examination of the right-lower field reveals decreased breath sounds. Examination of the left-lower field reveals decreased breath sounds. Decreased breath sounds present. No wheezing, rhonchi or rales.  Abdominal:     Palpations: Abdomen is soft.     Tenderness: There is no abdominal tenderness.  Musculoskeletal:     Right lower leg: No swelling.     Left lower leg: No swelling.  Skin:    General: Skin is warm.     Findings: No  rash.  Neurological:     Mental Status: She is alert and oriented to person, place, and time.     Data Reviewed: Sodium 132, potassium 3.3, creatinine 0.5, white blood cell count 7.9, procalcitonin 0.3 and 0.27, hemoglobin 10, platelet count 248  Family Communication: Spoke with family at the bedside  Disposition: Status is: Inpatient Remains inpatient appropriate because: Had thoracentesis today, currently on bubble high flow nasal cannula  Planned Discharge Destination:  Home    Time spent: 28 minutes  Author: Loletha Grayer, MD 01/28/2022 1:35 PM  For on call review [email or web address].

## 2022-01-28 NOTE — Progress Notes (Deleted)
PHARMACIST - PHYSICIAN COMMUNICATION  CONCERNING: IV to Oral Route Change Policy  RECOMMENDATION: This patient is receiving pantoprazole by the intravenous route.  Based on criteria approved by the Pharmacy and Therapeutics Committee, the intravenous medication(s) is/are being converted to the equivalent oral dose form(s).   DESCRIPTION: These criteria include: The patient is eating (either orally or via tube) and/or has been taking other orally administered medications for a least 24 hours The patient has no evidence of active gastrointestinal bleeding or impaired GI absorption (gastrectomy, short bowel, patient on TNA or NPO).  If you have questions about this conversion, please contact the [place] 01/28/2022 12:25 PM

## 2022-01-29 ENCOUNTER — Inpatient Hospital Stay: Payer: 59

## 2022-01-29 ENCOUNTER — Ambulatory Visit: Payer: 59

## 2022-01-29 DIAGNOSIS — C50411 Malignant neoplasm of upper-outer quadrant of right female breast: Secondary | ICD-10-CM | POA: Diagnosis not present

## 2022-01-29 DIAGNOSIS — J9621 Acute and chronic respiratory failure with hypoxia: Secondary | ICD-10-CM | POA: Diagnosis not present

## 2022-01-29 DIAGNOSIS — J910 Malignant pleural effusion: Secondary | ICD-10-CM | POA: Diagnosis not present

## 2022-01-29 DIAGNOSIS — J189 Pneumonia, unspecified organism: Secondary | ICD-10-CM | POA: Diagnosis not present

## 2022-01-29 LAB — CBC WITH DIFFERENTIAL/PLATELET
Abs Immature Granulocytes: 0.08 10*3/uL — ABNORMAL HIGH (ref 0.00–0.07)
Basophils Absolute: 0 10*3/uL (ref 0.0–0.1)
Basophils Relative: 0 %
Eosinophils Absolute: 0.1 10*3/uL (ref 0.0–0.5)
Eosinophils Relative: 1 %
HCT: 28.2 % — ABNORMAL LOW (ref 36.0–46.0)
Hemoglobin: 9.7 g/dL — ABNORMAL LOW (ref 12.0–15.0)
Immature Granulocytes: 1 %
Lymphocytes Relative: 2 %
Lymphs Abs: 0.2 10*3/uL — ABNORMAL LOW (ref 0.7–4.0)
MCH: 32.8 pg (ref 26.0–34.0)
MCHC: 34.4 g/dL (ref 30.0–36.0)
MCV: 95.3 fL (ref 80.0–100.0)
Monocytes Absolute: 0.7 10*3/uL (ref 0.1–1.0)
Monocytes Relative: 7 %
Neutro Abs: 8.7 10*3/uL — ABNORMAL HIGH (ref 1.7–7.7)
Neutrophils Relative %: 89 %
Platelets: 218 10*3/uL (ref 150–400)
RBC: 2.96 MIL/uL — ABNORMAL LOW (ref 3.87–5.11)
RDW: 15.3 % (ref 11.5–15.5)
WBC: 9.8 10*3/uL (ref 4.0–10.5)
nRBC: 0 % (ref 0.0–0.2)

## 2022-01-29 LAB — BASIC METABOLIC PANEL
Anion gap: 8 (ref 5–15)
BUN: 7 mg/dL — ABNORMAL LOW (ref 8–23)
CO2: 32 mmol/L (ref 22–32)
Calcium: 8.9 mg/dL (ref 8.9–10.3)
Chloride: 91 mmol/L — ABNORMAL LOW (ref 98–111)
Creatinine, Ser: 0.56 mg/dL (ref 0.44–1.00)
GFR, Estimated: >60 mL/min (ref >60)
Glucose, Bld: 132 mg/dL — ABNORMAL HIGH (ref 70–99)
Potassium: 3.3 mmol/L — ABNORMAL LOW (ref 3.5–5.1)
Sodium: 131 mmol/L — ABNORMAL LOW (ref 135–145)

## 2022-01-29 MED ORDER — DOCUSATE SODIUM 100 MG PO CAPS
100.0000 mg | ORAL_CAPSULE | Freq: Two times a day (BID) | ORAL | Status: DC
  Administered 2022-01-29 – 2022-02-03 (×7): 100 mg via ORAL
  Filled 2022-01-29 (×9): qty 1

## 2022-01-29 MED ORDER — POTASSIUM CHLORIDE 20 MEQ PO PACK
20.0000 meq | PACK | Freq: Every day | ORAL | Status: DC
  Administered 2022-01-29 – 2022-02-03 (×6): 20 meq via ORAL
  Filled 2022-01-29 (×6): qty 1

## 2022-01-29 MED ORDER — POTASSIUM CHLORIDE CRYS ER 20 MEQ PO TBCR: 20.0000 meq | EXTENDED_RELEASE_TABLET | Freq: Every day | ORAL | Status: DC

## 2022-01-29 MED ORDER — ENSURE ENLIVE PO LIQD
237.0000 mL | Freq: Three times a day (TID) | ORAL | Status: DC
  Administered 2022-01-29 – 2022-02-03 (×7): 237 mL via ORAL

## 2022-01-29 MED ORDER — LIDOCAINE HCL (PF) 1 % IJ SOLN
10.0000 mL | Freq: Once | INTRAMUSCULAR | Status: AC
  Administered 2022-01-29: 10 mL via INTRADERMAL

## 2022-01-29 NOTE — Progress Notes (Signed)
Progress Note   Patient: Amanda Skinner [id number]:[id number] DOB: 07/12/1960 DOA: 01/27/2022     2 DOS: the patient was seen and examined on 01/29/2022   Brief hospital course: 61 year old female with past medical history of metastatic breast cancer to bone, lungs, lung pleura and lymph nodes.  She was recently started on oxygen as outpatient prior to that admission.  She was recently in the hospital from 11/29 to 12/3 and treated for pneumonia.  Patient readmitted on 01/27/2022 with pneumonia and pleural effusions.  Thoracentesis on 01/28/2022 remove 950 mL underneath the right lung.  Thoracentesis on 12/7 removed 800 mL underneath the left lung.  Assessment and Plan: * Acute on chronic respiratory failure with hypoxia (HCC) O2 sat 88% on home flow rate of 3.5, now on bubble high flow nasal cannula 7 L. Patient elects to be DNR/DNI (okay to do pressors and BiPAP if needed).    Multifocal pneumonia Patient recently received Rocephin and Zithromax.  Patient was started on vancomycin and meropenem initially.  Since MRSA PCR is negative vancomycin was discontinued.  Continue Zosyn.  Viral respiratory panel negative sepsis ruled out.  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  Patient had 550 mL of fluid removed underneath the left lung on 01/23/2022.  800 mL removed from underneath the left lung on 01/29/2022.  Malignant neoplasm of upper-outer quadrant of right breast in female, estrogen receptor positive ([place]) Patient has widespread metastatic disease mainly with lung nodules, mediastinal and hilar adenopathy.  Patient also has bilateral pleural effusions. Worsening pulmonary mets on CT and new metastasis on spleen seen Case discussed with Dr. [name] oncology and palliative care [place].   Hyponatremia Likely related to malignancy.  Hypokalemia We will give oral supplementation.  Nausea As needed nausea medication.  Mild  intermittent asthma Albuterol as needed        Subjective: Patient feeling sleepy and nauseous this morning.  Taking pain meds a little less frequently.  Right-sided rib pain a little less painful today.  Admitted with shortness of breath and has bilateral pleural effusions  Physical Exam: Vitals:   01/29/22 0805 01/29/22 1040 01/29/22 1105 01/29/22 1220  BP: 114/66 115/65 111/64 105/63  Pulse: (!) 113 (!) 114 (!) 109 (!) 114  Resp: 16   18  Temp: 99.1 F (37.3 C)   98.3 F (36.8 C)  TempSrc:      SpO2: 92% 93% 97% 93%  Weight:      Height:       Physical Exam HENT:     Head: Normocephalic.     Mouth/Throat:     Pharynx: No oropharyngeal exudate.  Eyes:     General: Lids are normal.     Conjunctiva/sclera: Conjunctivae normal.  Cardiovascular:     Rate and Rhythm: Regular rhythm. Tachycardia present.     Heart sounds: Normal heart sounds, S1 normal and S2 normal.  Pulmonary:     Breath sounds: Examination of the left-middle field reveals decreased breath sounds. Examination of the right-lower field reveals decreased breath sounds and rhonchi. Examination of the left-lower field reveals decreased breath sounds and rhonchi. Decreased breath sounds and rhonchi present. No wheezing or rales.  Abdominal:     Palpations: Abdomen is soft.     Tenderness: There is no abdominal tenderness.  Musculoskeletal:     Right lower leg: No swelling.     Left lower leg: No swelling.  Skin:    General: Skin is warm.  Findings: No rash.  Neurological:     Mental Status: She is alert and oriented to person, place, and time.     Data Reviewed: Sodium 131, potassium 3.3, hemoglobin 9.7, white blood cell count 9.8  Family Communication: Spoke with patient's daughter at the bedside  Disposition: Status is: Inpatient Remains inpatient appropriate because: Had fever yesterday afternoon.  Still on high flow nasal cannula 7 L.  Trying to set up 2 oxygen concentrator's at home and  hospital bed.  Planned Discharge Destination: Home    Time spent: 28 minutes  Author: Loletha Grayer, MD 01/29/2022 2:12 PM  For on call review [email or web address].

## 2022-01-29 NOTE — Procedures (Signed)
PROCEDURE SUMMARY:  Successful US guided left thoracentesis. Yielded 800 mL of clear yellow fluid. Pt tolerated procedure well. No immediate complications.  Specimen was not sent for labs. CXR ordered.  EBL < 1 mL  Tsosie Billing D PA-C 01/29/2022 11:11 AM

## 2022-01-29 NOTE — Progress Notes (Signed)
   01/28/22 2000  Assess: MEWS Score  Temp 99.9 F (37.7 C)  BP 116/71  MAP (mmHg) 85  Pulse Rate (!) 124  Resp 20  SpO2 93 %  O2 Device Nasal Cannula  O2 Flow Rate (L/min) 7 L/min  Assess: MEWS Score  MEWS Temp 0  MEWS Systolic 0  MEWS Pulse 2  MEWS RR 0  MEWS LOC 0  MEWS Score 2  MEWS Score Color Yellow  Assess: if the MEWS score is Yellow or Red  Were vital signs taken at a resting state? Yes  Focused Assessment No change from prior assessment  Does the patient meet 2 or more of the SIRS criteria? Yes  Does the patient have a confirmed or suspected source of infection? Yes  Provider and Rapid Response Notified? Yes  MEWS guidelines implemented *See Row Information* No, previously yellow, continue vital signs every 4 hours  Treat  MEWS Interventions Administered scheduled meds/treatments;Administered prn meds/treatments  Notify: Charge Nurse/RN  Name of Charge Nurse/RN Notified Candelaria Stagers  Date Charge Nurse/RN Notified 01/27/22  Time Charge Nurse/RN Notified 2030  Assess: SIRS CRITERIA  SIRS Temperature  0  SIRS Pulse 1  SIRS Respirations  0  SIRS WBC 1  SIRS Score Sum  2

## 2022-01-29 NOTE — Progress Notes (Signed)
PT Cancellation Note  Patient Details Name: Barbra Miner MRN: [id number] DOB: 1961/02/08   Cancelled Treatment:    Reason Eval/Treat Not Completed: Patient declined, no reason specified Patient reports she just got pain medicine and will likely be asleep once that takes effect. Will re-attempt tomorrow.   Bretton Tandy 01/29/2022, 2:36 PM

## 2022-01-29 NOTE — Progress Notes (Signed)
   01/29/22 2000  Assess: MEWS Score  Temp (!) 101.2 F (38.4 C)  BP 118/83  MAP (mmHg) 95  Pulse Rate (!) 133  Resp 20  SpO2 92 %  O2 Device HFNC  O2 Flow Rate (L/min) 7 L/min  Assess: MEWS Score  MEWS Temp 1  MEWS Systolic 0  MEWS Pulse 3  MEWS RR 0  MEWS LOC 0  MEWS Score 4  MEWS Score Color Red  Assess: if the MEWS score is Yellow or Red  Were vital signs taken at a resting state? Yes  Focused Assessment No change from prior assessment  Does the patient meet 2 or more of the SIRS criteria? Yes  Does the patient have a confirmed or suspected source of infection? Yes  Provider and Rapid Response Notified? Yes  MEWS guidelines implemented *See Row Information* No, previously red, continue vital signs every 4 hours  Treat  MEWS Interventions Administered scheduled meds/treatments;Administered prn meds/treatments  Notify: Charge Nurse/RN  Name of Charge Nurse/RN Notified Ernestina Penna, RN  Date Charge Nurse/RN Notified 01/29/22  Time Charge Nurse/RN Notified 2100  Assess: SIRS CRITERIA  SIRS Temperature  1  SIRS Pulse 1  SIRS Respirations  0  SIRS WBC 1  SIRS Score Sum  3

## 2022-01-30 ENCOUNTER — Inpatient Hospital Stay: Payer: 59

## 2022-01-30 ENCOUNTER — Ambulatory Visit: Payer: 59

## 2022-01-30 DIAGNOSIS — B370 Candidal stomatitis: Secondary | ICD-10-CM | POA: Diagnosis not present

## 2022-01-30 DIAGNOSIS — M25512 Pain in left shoulder: Secondary | ICD-10-CM | POA: Insufficient documentation

## 2022-01-30 DIAGNOSIS — J9621 Acute and chronic respiratory failure with hypoxia: Secondary | ICD-10-CM | POA: Diagnosis not present

## 2022-01-30 DIAGNOSIS — C50411 Malignant neoplasm of upper-outer quadrant of right female breast: Secondary | ICD-10-CM | POA: Diagnosis not present

## 2022-01-30 DIAGNOSIS — C50911 Malignant neoplasm of unspecified site of right female breast: Secondary | ICD-10-CM | POA: Diagnosis not present

## 2022-01-30 DIAGNOSIS — J90 Pleural effusion, not elsewhere classified: Secondary | ICD-10-CM

## 2022-01-30 DIAGNOSIS — J189 Pneumonia, unspecified organism: Secondary | ICD-10-CM | POA: Diagnosis not present

## 2022-01-30 DIAGNOSIS — J910 Malignant pleural effusion: Secondary | ICD-10-CM | POA: Diagnosis not present

## 2022-01-30 LAB — BASIC METABOLIC PANEL
Anion gap: 5 (ref 5–15)
BUN: 6 mg/dL — ABNORMAL LOW (ref 8–23)
CO2: 34 mmol/L — ABNORMAL HIGH (ref 22–32)
Calcium: 8.9 mg/dL (ref 8.9–10.3)
Chloride: 92 mmol/L — ABNORMAL LOW (ref 98–111)
Creatinine, Ser: 0.54 mg/dL (ref 0.44–1.00)
GFR, Estimated: >60 mL/min (ref >60)
Glucose, Bld: 122 mg/dL — ABNORMAL HIGH (ref 70–99)
Potassium: 3 mmol/L — ABNORMAL LOW (ref 3.5–5.1)
Sodium: 131 mmol/L — ABNORMAL LOW (ref 135–145)

## 2022-01-30 LAB — CYTOLOGY - NON PAP

## 2022-01-30 LAB — MAGNESIUM: Magnesium: 1.6 mg/dL — ABNORMAL LOW (ref 1.7–2.4)

## 2022-01-30 MED ORDER — MAGNESIUM SULFATE 2 GM/50ML IV SOLN
2.0000 g | Freq: Once | INTRAVENOUS | Status: AC
  Administered 2022-01-30: 2 g via INTRAVENOUS
  Filled 2022-01-30: qty 50

## 2022-01-30 MED ORDER — FLUCONAZOLE 40 MG/ML PO SUSR
100.0000 mg | Freq: Every day | ORAL | Status: DC
  Administered 2022-01-31 – 2022-02-03 (×4): 100 mg via ORAL
  Filled 2022-01-30 (×4): qty 2.5

## 2022-01-30 MED ORDER — MAGNESIUM SULFATE 4 GM/100ML IV SOLN: 4.0000 g | Freq: Once | INTRAVENOUS | Status: DC

## 2022-01-30 MED ORDER — FLUCONAZOLE IN SODIUM CHLORIDE 200-0.9 MG/100ML-% IV SOLN
200.0000 mg | Freq: Once | INTRAVENOUS | Status: AC
  Administered 2022-01-30: 200 mg via INTRAVENOUS
  Filled 2022-01-30 (×3): qty 100

## 2022-01-30 MED ORDER — IBUPROFEN 100 MG/5ML PO SUSP
600.0000 mg | Freq: Three times a day (TID) | ORAL | Status: DC | PRN
  Administered 2022-01-30 – 2022-02-03 (×4): 600 mg via ORAL
  Filled 2022-01-30: qty 30
  Filled 2022-01-30: qty 60
  Filled 2022-01-30 (×6): qty 30

## 2022-01-30 MED ORDER — SODIUM CHLORIDE 0.9 % IV SOLN
3.0000 g | Freq: Four times a day (QID) | INTRAVENOUS | Status: DC
  Administered 2022-01-30 – 2022-02-02 (×10): 3 g via INTRAVENOUS
  Filled 2022-01-30: qty 3
  Filled 2022-01-30: qty 8
  Filled 2022-01-30: qty 3
  Filled 2022-01-30 (×3): qty 8
  Filled 2022-01-30 (×2): qty 3
  Filled 2022-01-30: qty 8
  Filled 2022-01-30: qty 3
  Filled 2022-01-30 (×2): qty 8

## 2022-01-30 NOTE — Progress Notes (Addendum)
Pt is complaining of left arm pain hurts with movement. MD Weiting made aware. Incoming shift made aware.Will continue to monitor.  Update 0716: MD Wieting placed order.

## 2022-01-30 NOTE — Assessment & Plan Note (Signed)
Improving on the tongue.  Continue Diflucan

## 2022-01-30 NOTE — Assessment & Plan Note (Addendum)
Replaced into the normal range

## 2022-01-30 NOTE — Evaluation (Signed)
Physical Therapy Evaluation Patient Details Name: Amanda Skinner MRN: [id number] DOB: 01-17-1961 Today's Date: 01/30/2022  History of Present Illness  Per MD H&P: Pt is a 61 y.o. female with medical history significant for metastatic breast cancer s/p lumpectomy, chemoradiation soon to start palliative chemotherapy, chronic respiratory failure on home O2 at 3.5 L, mild intermittent asthma, recently hospitalized from 11/29 to 12/3 with sepsis secondary to multifocal pneumonia, completing a course of cefdinir on 12/4 who presents to the ED with persistent cough, shortness of breath, fevers.  She also has protracted nausea. MD assessment includes: Acute on chronic respiratory failure with hypoxia, multifocal pneumonia, malignant pleural effusion, hyponatremia, hypokalemia, nausea, and mild intermittent asthma.   Clinical Impression  Pt was pleasant and motivated to participate during the session and put forth good effort throughout. Pt required no physical assistance during the session but did require extra time and effort and cuing for sequencing with functional tasks.  Pt able to amb a max of around 15 feet this session with slow cadence and short B step length but was generally steady with no overt LOB.  Pt's SpO2 and HR monitored frequently during the session with pt on 7LO2/min with both WNL throughout.  Pt reported no adverse symptoms during the session other than L shoulder pain.  Pt will benefit from HHPT upon discharge to safely address deficits listed in patient problem list for decreased caregiver assistance and eventual return to PLOF.         Recommendations for follow up therapy are one component of a multi-disciplinary discharge planning process, led by the attending physician.  Recommendations may be updated based on patient status, additional functional criteria and insurance authorization.  Follow Up Recommendations Home health PT      Assistance Recommended at Discharge  Frequent or constant Supervision/Assistance  Patient can return home with the following  A little help with walking and/or transfers;A little help with bathing/dressing/bathroom;Assistance with cooking/housework;Assist for transportation;Help with stairs or ramp for entrance    Equipment Recommendations None recommended  Recommendations for Other Services       Functional Status Assessment Patient has had a recent decline in their functional status and demonstrates the ability to make significant improvements in function in a reasonable and predictable amount of time.     Precautions / Restrictions Precautions Precautions: Fall Restrictions Weight Bearing Restrictions: No      Mobility  Bed Mobility Overal bed mobility: Modified Independent             General bed mobility comments: Min extra time and effort required    Transfers Overall transfer level: Needs assistance Equipment used: Rolling walker (2 wheels)               General transfer comment: Min verbal cues for sequencing    Ambulation/Gait Ambulation/Gait assistance: Min guard Gait Distance (Feet): 15 Feet Assistive device: Rolling walker (2 wheels) Gait Pattern/deviations: Step-through pattern, Decreased step length - right, Decreased step length - left, Trunk flexed Gait velocity: decreased     General Gait Details: Slow cadence with short B step length and mod lean on the RW for support but steady without LOB  Stairs            Wheelchair Mobility    Modified Rankin (Stroke Patients Only)       Balance Overall balance assessment: Needs assistance Sitting-balance support: Feet supported Sitting balance-Leahy Scale: Good     Standing balance support: Bilateral upper extremity supported, During functional activity, Reliant  on assistive device for balance Standing balance-Leahy Scale: Fair                               Pertinent Vitals/Pain Pain Assessment Pain  Assessment: 0-10 Pain Score: 8  Pain Location: L shoulder Pain Descriptors / Indicators: Sore Pain Intervention(s): Premedicated before session, Monitored during session, RN gave pain meds during session, Patient requesting pain meds-RN notified    Home Living Family/patient expects to be discharged to:: Private residence Living Arrangements: Children;Spouse/significant other Available Help at Discharge: Family;Available 24 hours/day Type of Home: House Home Access: Stairs to enter Entrance Stairs-Rails: Right;Left (too wide for both) Entrance Stairs-Number of Steps: 4   Home Layout: Two level;Able to live on main level with bedroom/bathroom Home Equipment: Rolling Walker (2 wheels);Cane - single point, [facility] Additional Comments: Above describes daughter's home that patient will d/c to from acute care    Prior Function Prior Level of Function : Independent/Modified Independent;Working/employed             Mobility Comments: Ind amb community distances without an AD ADLs Comments: Ind with ADLs     Hand Dominance        Extremity/Trunk Assessment   Upper Extremity Assessment Upper Extremity Assessment: Generalized weakness    Lower Extremity Assessment Lower Extremity Assessment: Generalized weakness       Communication   Communication: No difficulties  Cognition Arousal/Alertness: Awake/alert Behavior During Therapy: WFL for tasks assessed/performed Overall Cognitive Status: Within Functional Limits for tasks assessed                                          General Comments      Exercises Other Exercises Other Exercises: SpO2 and HR monitored fequently during the session   Assessment/Plan    PT Assessment Patient needs continued PT services  PT Problem List Decreased strength;Decreased activity tolerance;Decreased balance;Decreased mobility;Decreased knowledge of use of DME;Pain       PT Treatment Interventions DME instruction;Gait  training;Stair training;Functional mobility training;Therapeutic activities;Therapeutic exercise;Balance training;Patient/family education    PT Goals (Current goals can be found in the Care Plan section)  Acute Rehab PT Goals Patient Stated Goal: To get stronger PT Goal Formulation: With patient Time For Goal Achievement: 02/12/22 Potential to Achieve Goals: Good    Frequency Min 2X/week     Co-evaluation               AM-PAC PT "6 Clicks" Mobility  Outcome Measure Help needed turning from your back to your side while in a flat bed without using bedrails?: A Little Help needed moving from lying on your back to sitting on the side of a flat bed without using bedrails?: A Little Help needed moving to and from a bed to a chair (including a wheelchair)?: A Little Help needed standing up from a chair using your arms (e.g., wheelchair or bedside chair)?: A Little Help needed to walk in hospital room?: A Little Help needed climbing 3-5 steps with a railing? : A Lot 6 Click Score: 17    End of Session Equipment Utilized During Treatment: Gait belt;Oxygen Activity Tolerance: Patient tolerated treatment well Patient left: in chair;with call bell/phone within reach;with chair alarm set;with family/visitor present Nurse Communication: Mobility status PT Visit Diagnosis: Difficulty in walking, not elsewhere classified (R26.2);Muscle weakness (generalized) (M62.81);Pain Pain - Right/Left: Left  Pain - part of body: Shoulder    Time: 1012-1040 PT Time Calculation (min) (ACUTE ONLY): 28 min   Charges:   PT Evaluation $PT Eval Moderate Complexity: 1 Mod PT Treatments $Therapeutic Activity: 8-22 mins       D. Scott Armondo Cech PT, DPT 01/30/22, 11:59 AM
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
## 2022-01-30 NOTE — Progress Notes (Signed)
Patient is a yellow MEWS due to tachycardia. Tachycardia is not new and patient states that it is her baseline. Looked into her chart and she has been yellow MEWS for much of her stay here. No changes noted from previous MEWS. Consulted with charge nurse Alex-not escalated.

## 2022-01-30 NOTE — Progress Notes (Addendum)
Progress Note   Patient: Amanda Skinner FIE:[id number] DOB: 1960/09/13 DOA: 01/27/2022     3 DOS: the patient was seen and examined on 01/30/2022   Brief hospital course: 61 year old female with past medical history of metastatic breast cancer to bone, lungs, lung pleura and lymph nodes.  She was recently started on oxygen as outpatient prior to that admission.  She was recently in the hospital from 11/29 to 12/3 and treated for pneumonia.  Patient readmitted on 01/27/2022 with pneumonia and pleural effusions.  Thoracentesis on 01/28/2022 remove 950 mL underneath the right lung.  Thoracentesis on 12/7 removed 800 mL underneath the left lung.  Assessment and Plan: * Acute on chronic respiratory failure with hypoxia (HCC) O2 sat 88% on home flow rate of 3.5, now on bubble high flow nasal cannula 7 L.  Patient elects to be DNR/DNI (okay to do pressors and BiPAP if needed).  Trying to set up to oxygen concentrator's at home.    Multifocal pneumonia Patient recently received Rocephin and Zithromax.  Patient was started on vancomycin and meropenem initially.  Since MRSA PCR is negative vancomycin was discontinued.  Continue Zosyn.  Viral respiratory panel negative.  With patient having persistent fevers at night will consult infectious disease.  Not sure if this is tumor fever or not.  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  Patient had 550 mL of fluid removed underneath the left lung on 01/23/2022.  800 mL removed from underneath the left lung on 01/29/2022.  Malignant neoplasm of upper-outer quadrant of right breast in female, estrogen receptor positive ([place]) Patient has widespread metastatic disease mainly with lung nodules, mediastinal and hilar adenopathy.  Patient also has bilateral pleural effusions. Worsening pulmonary mets on CT and new metastasis on spleen seen Case discussed with Dr. [name] oncology and palliative care [place].   Hypomagnesemia Replaced 2 g magnesium this morning and will give another 2 g magnesium this evening.  Thrush IV Diflucan today and switch over to oral for tomorrow.  Left shoulder pain Will consult orthopedic surgery.  Does not seem like a septic joint to me.  No pain upon palpation.  Humerus x-ray which included the shoulder only showed osteoarthritis.  Hyponatremia Likely related to malignancy.  Hypokalemia We will give oral supplementation.  Nausea As needed nausea medication.  Mild intermittent asthma Albuterol as needed        Subjective: Patient still with poor appetite and not eating very well.  Still on 7 L high flow nasal cannula.  Complains of painful swallowing and diagnosed with thrush today.  Physical Exam: Vitals:   01/30/22 0510 01/30/22 0908 01/30/22 1149 01/30/22 1409  BP: 130/60 118/63 133/67   Pulse: (!) 118 (!) 115 (!) 106   Resp: 20 20 20    Temp: 99.8 F (37.7 C) 98.8 F (37.1 C) 98.3 F (36.8 C)   TempSrc: Oral Oral    SpO2: 93% 94% 95% 93%  Weight:      Height:       Physical Exam HENT:     Head: Normocephalic.     Mouth/Throat:     Pharynx: No oropharyngeal exudate.     Comments: Thrush on tongue Eyes:     General: Lids are normal.     Conjunctiva/sclera: Conjunctivae normal.  Cardiovascular:     Rate and Rhythm: Regular rhythm. Tachycardia present.     Heart sounds: Normal heart sounds, S1 normal and S2 normal.  Pulmonary:  Breath sounds: Examination of the right-lower field reveals decreased breath sounds and rhonchi. Examination of the left-lower field reveals decreased breath sounds and rhonchi. Decreased breath sounds and rhonchi present. No wheezing or rales.  Abdominal:     Palpations: Abdomen is soft.     Tenderness: There is no abdominal tenderness.  Musculoskeletal:     Right lower leg: No swelling.     Left lower leg: No swelling.  Skin:    General: Skin is warm.     Findings: No rash.  Neurological:      Mental Status: She is alert and oriented to person, place, and time.     Data Reviewed: Sodium 131, potassium 3.0, creatinine 0.54, magnesium 1.6  Family Communication: Spoke with patient's daughter and sister at the bedside  Disposition: Status is: Inpatient Remains inpatient appropriate because: Still having fever and on high flow nasal cannula.  Planned Discharge Destination: Home    Time spent: 27 minutes Case discussed with palliative care [place], infectious disease and orthopedic surgery.  Author: Loletha Grayer, MD 01/30/2022 2:13 PM  For on call review [email or web address].

## 2022-01-30 NOTE — Plan of Care (Signed)

## 2022-01-30 NOTE — Evaluation (Signed)
Occupational Therapy Evaluation Patient Details Name: Amanda Skinner MRN: [id number] DOB: [birth year] Today's Date: 01/30/2022   History of Present Illness Per MD H&P: Pt is a 61 y.o. female with medical history significant for metastatic breast cancer s/p lumpectomy, chemoradiation soon to start palliative chemotherapy, chronic respiratory failure on home O2 at 3.5 L, mild intermittent asthma, recently hospitalized from 11/29 to 12/3 with sepsis secondary to multifocal pneumonia, completing a course of cefdinir on 12/4 who presents to the ED with persistent cough, shortness of breath, fevers.  She also has protracted nausea. MD assessment includes: Acute on chronic respiratory failure with hypoxia, multifocal pneumonia, malignant pleural effusion, hyponatremia, hypokalemia, nausea, and mild intermittent asthma.   Clinical Impression   Amanda Skinner was seen for OT evaluation this date. Prior to hospital admission, pt was IND for mobility and ADLs. Pt lives with family. Pt presents to acute OT demonstrating impaired ADL performance and functional mobility 2/2 decreased activity tolerance and functional balance deficits. Pt currently requires SUPERVISION + RW for toilet t/f. MOD I pericare and LB bathing in sitting. CGA don underwear in standing. Pt would benefit from skilled OT to address balance and activity tolerance. Upon hospital discharge, recommend no OT follow up.   Recommendations for follow up therapy are one component of a multi-disciplinary discharge planning process, led by the attending physician.  Recommendations may be updated based on patient status, additional functional criteria and insurance authorization.   Follow Up Recommendations  No OT follow up     Assistance Recommended at Discharge Set up Supervision/Assistance  Patient can return home with the following A little help with walking and/or transfers;A little help with bathing/dressing/bathroom    Functional Status  Assessment  Patient has had a recent decline in their functional status and demonstrates the ability to make significant improvements in function in a reasonable and predictable amount of time.  Equipment Recommendations  None recommended by OT    Recommendations for Other Services       Precautions / Restrictions Precautions Precautions: Fall Restrictions Weight Bearing Restrictions: No      Mobility Bed Mobility Overal bed mobility: Modified Independent                  Transfers Overall transfer level: Needs assistance Equipment used: Rolling walker (2 wheels) Transfers: Sit to/from Stand Sit to Stand: Supervision                  Balance Overall balance assessment: Needs assistance Sitting-balance support: Feet supported Sitting balance-Leahy Scale: Good     Standing balance support: No upper extremity supported, During functional activity Standing balance-Leahy Scale: Fair                             ADL either performed or assessed with clinical judgement   ADL Overall ADL's : Needs assistance/impaired                                       General ADL Comments: SUPERVISION + RW for toilet t/f. MOD I pericare and LB bathing in sitting. CGA don underwear in standing      Pertinent Vitals/Pain Pain Assessment Pain Assessment: Faces Faces Pain Scale: No hurt     Hand Dominance     Extremity/Trunk Assessment Upper Extremity Assessment Upper Extremity Assessment: Overall WFL for tasks assessed   Lower  Extremity Assessment Lower Extremity Assessment: Generalized weakness       Communication Communication Communication: No difficulties   Cognition Arousal/Alertness: Awake/alert Behavior During Therapy: WFL for tasks assessed/performed Overall Cognitive Status: Within Functional Limits for tasks assessed                                       General Comments       Exercises Other  Exercises Other Exercises: SpO2 and HR monitored fequently during the session, WNL t/o   Shoulder Instructions      Home Living Family/patient expects to be discharged to:: Private residence Living Arrangements: Children;Spouse/significant other Available Help at Discharge: Family;Available 24 hours/day Type of Home: House Home Access: Stairs to enter [employer] of Steps: 4 Entrance Stairs-Rails: Right;Left Home Layout: Two level;Able to live on main level with bedroom/bathroom         Bathroom Toilet: Standard     Home Equipment: Conservation officer, nature (2 wheels);Cane - single point   Additional Comments: Above describes daughter's home that patient will d/c to from acute care      Prior Functioning/Environment Prior Level of Function : Independent/Modified Independent;Working/employed             Mobility Comments: Ind amb community distances without an AD ADLs Comments: Ind with ADLs        OT Problem List: Impaired balance (sitting and/or standing);Decreased activity tolerance      OT Treatment/Interventions: Self-care/ADL training;Therapeutic exercise;Energy conservation;DME and/or AE instruction;Therapeutic activities;Patient/family education;Balance training    OT Goals(Current goals can be found in the care plan section) Acute Rehab OT Goals Patient Stated Goal: to go home OT Goal Formulation: With patient/family Time For Goal Achievement: 02/13/22 Potential to Achieve Goals: Good ADL Goals Pt Will Perform Lower Body Dressing: Independently;sitting/lateral leans Pt Will Perform Tub/Shower Transfer: Shower transfer;Independently Pt/caregiver will Perform Home Exercise Program: Increased ROM;Increased strength;Both right and left upper extremity  OT Frequency: Min 2X/week    Co-evaluation              AM-PAC OT "6 Clicks" Daily Activity     Outcome Measure Help from another person eating meals?: None Help from another person taking care of  personal grooming?: None Help from another person toileting, which includes using toliet, bedpan, or urinal?: None Help from another person bathing (including washing, rinsing, drying)?: A Little Help from another person to put on and taking off regular upper body clothing?: None Help from another person to put on and taking off regular lower body clothing?: A Little 6 Click Score: 22   End of Session    Activity Tolerance: Patient tolerated treatment well Patient left: in bed;with call bell/phone within reach;with family/visitor present  OT Visit Diagnosis: Other abnormalities of gait and mobility (R26.89);Muscle weakness (generalized) (M62.81)                Time: 0762-2633 OT Time Calculation (min): 30 min Charges:  OT General Charges $OT Visit: 1 Visit OT Evaluation $OT Eval Moderate Complexity: 1 Mod OT Treatments $Self Care/Home Management : 23-37 mins  Dessie Coma, M.S. OTR/L  01/30/22, 2:07 PM  ascom [phone]

## 2022-01-30 NOTE — Assessment & Plan Note (Signed)
Will consult orthopedic surgery.  Does not seem like a septic joint to me.  No pain upon palpation.  Humerus x-ray which included the shoulder only showed osteoarthritis.

## 2022-01-31 ENCOUNTER — Other Ambulatory Visit: Payer: Self-pay | Admitting: Oncology

## 2022-01-31 ENCOUNTER — Inpatient Hospital Stay: Payer: 59

## 2022-01-31 DIAGNOSIS — D638 Anemia in other chronic diseases classified elsewhere: Secondary | ICD-10-CM | POA: Insufficient documentation

## 2022-01-31 DIAGNOSIS — J189 Pneumonia, unspecified organism: Secondary | ICD-10-CM | POA: Diagnosis not present

## 2022-01-31 DIAGNOSIS — J910 Malignant pleural effusion: Secondary | ICD-10-CM | POA: Diagnosis not present

## 2022-01-31 DIAGNOSIS — R52 Pain, unspecified: Secondary | ICD-10-CM | POA: Insufficient documentation

## 2022-01-31 DIAGNOSIS — M79605 Pain in left leg: Secondary | ICD-10-CM

## 2022-01-31 DIAGNOSIS — C50411 Malignant neoplasm of upper-outer quadrant of right female breast: Secondary | ICD-10-CM | POA: Diagnosis not present

## 2022-01-31 DIAGNOSIS — J9621 Acute and chronic respiratory failure with hypoxia: Secondary | ICD-10-CM | POA: Diagnosis not present

## 2022-01-31 LAB — COMPREHENSIVE METABOLIC PANEL
ALT: 16 U/L (ref 0–44)
AST: 23 U/L (ref 15–41)
Albumin: 2 g/dL — ABNORMAL LOW (ref 3.5–5.0)
Alkaline Phosphatase: 80 U/L (ref 38–126)
Anion gap: 8 (ref 5–15)
BUN: 5 mg/dL — ABNORMAL LOW (ref 8–23)
CO2: 34 mmol/L — ABNORMAL HIGH (ref 22–32)
Calcium: 8.6 mg/dL — ABNORMAL LOW (ref 8.9–10.3)
Chloride: 90 mmol/L — ABNORMAL LOW (ref 98–111)
Creatinine, Ser: 0.53 mg/dL (ref 0.44–1.00)
GFR, Estimated: >60 mL/min (ref >60)
Glucose, Bld: 128 mg/dL — ABNORMAL HIGH (ref 70–99)
Potassium: 3 mmol/L — ABNORMAL LOW (ref 3.5–5.1)
Sodium: 132 mmol/L — ABNORMAL LOW (ref 135–145)
Total Bilirubin: 0.6 mg/dL (ref 0.3–1.2)
Total Protein: 5.3 g/dL — ABNORMAL LOW (ref 6.5–8.1)

## 2022-01-31 LAB — CBC WITH DIFFERENTIAL/PLATELET
Abs Immature Granulocytes: 0.1 10*3/uL — ABNORMAL HIGH (ref 0.00–0.07)
Basophils Absolute: 0 10*3/uL (ref 0.0–0.1)
Basophils Relative: 0 %
Eosinophils Absolute: 0.1 10*3/uL (ref 0.0–0.5)
Eosinophils Relative: 1 %
HCT: 27.2 % — ABNORMAL LOW (ref 36.0–46.0)
Hemoglobin: 9.3 g/dL — ABNORMAL LOW (ref 12.0–15.0)
Immature Granulocytes: 1 %
Lymphocytes Relative: 2 %
Lymphs Abs: 0.2 10*3/uL — ABNORMAL LOW (ref 0.7–4.0)
MCH: 32.3 pg (ref 26.0–34.0)
MCHC: 34.2 g/dL (ref 30.0–36.0)
MCV: 94.4 fL (ref 80.0–100.0)
Monocytes Absolute: 0.6 10*3/uL (ref 0.1–1.0)
Monocytes Relative: 6 %
Neutro Abs: 8.5 10*3/uL — ABNORMAL HIGH (ref 1.7–7.7)
Neutrophils Relative %: 90 %
Platelets: 255 10*3/uL (ref 150–400)
RBC: 2.88 MIL/uL — ABNORMAL LOW (ref 3.87–5.11)
RDW: 15 % (ref 11.5–15.5)
WBC: 9.4 10*3/uL (ref 4.0–10.5)
nRBC: 0 % (ref 0.0–0.2)

## 2022-01-31 LAB — CORTISOL: Cortisol, Plasma: 22.7 ug/dL

## 2022-01-31 LAB — BODY FLUID CULTURE W GRAM STAIN
Culture: NO GROWTH
Gram Stain: NONE SEEN

## 2022-01-31 LAB — MAGNESIUM: Magnesium: 2.1 mg/dL (ref 1.7–2.4)

## 2022-01-31 LAB — CRYPTOCOCCAL ANTIGEN: Crypto Ag: NEGATIVE

## 2022-01-31 LAB — PHOSPHORUS: Phosphorus: 2.1 mg/dL — ABNORMAL LOW (ref 2.5–4.6)

## 2022-01-31 MED ORDER — BUPIVACAINE HCL (PF) 0.5 % IJ SOLN
10.0000 mL | Freq: Once | INTRAMUSCULAR | Status: AC
  Administered 2022-01-31: 10 mL
  Filled 2022-01-31: qty 10

## 2022-01-31 MED ORDER — MORPHINE SULFATE (CONCENTRATE) 10 MG/0.5ML PO SOLN
5.0000 mg | ORAL | Status: AC
  Administered 2022-01-31 – 2022-02-01 (×5): 5 mg via ORAL
  Filled 2022-01-31 (×5): qty 0.5

## 2022-01-31 MED ORDER — TRIAMCINOLONE ACETONIDE 40 MG/ML IJ SUSP
40.0000 mg | Freq: Once | INTRAMUSCULAR | Status: AC
  Administered 2022-01-31: 40 mg
  Filled 2022-01-31 (×2): qty 1

## 2022-01-31 MED ORDER — MORPHINE SULFATE (CONCENTRATE) 10 MG/0.5ML PO SOLN
5.0000 mg | ORAL | Status: DC | PRN
  Administered 2022-02-01 – 2022-02-03 (×9): 5 mg via ORAL
  Filled 2022-01-31 (×10): qty 0.5

## 2022-01-31 MED ORDER — POTASSIUM PHOSPHATES 15 MMOLE/5ML IV SOLN
30.0000 mmol | Freq: Once | INTRAVENOUS | Status: AC
  Administered 2022-01-31: 30 mmol via INTRAVENOUS
  Filled 2022-01-31: qty 10

## 2022-01-31 MED ORDER — METHYLPREDNISOLONE SODIUM SUCC 40 MG IJ SOLR
40.0000 mg | Freq: Every day | INTRAMUSCULAR | Status: DC
  Administered 2022-01-31 – 2022-02-02 (×3): 40 mg via INTRAVENOUS
  Filled 2022-01-31 (×3): qty 1

## 2022-01-31 MED ORDER — FENTANYL 12 MCG/HR TD PT72
1.0000 | MEDICATED_PATCH | TRANSDERMAL | Status: DC
  Filled 2022-01-31: qty 1

## 2022-01-31 NOTE — Assessment & Plan Note (Addendum)
Switch Solu-Medrol over to prednisone and continue taper.  Continue Roxanol.

## 2022-01-31 NOTE — Assessment & Plan Note (Signed)
Hemoglobin up to 9.9.  Continue to monitor.

## 2022-01-31 NOTE — Progress Notes (Addendum)
Progress Note   Patient: Amanda Skinner NOM:[id number] DOB: 1960-03-17 DOA: 01/27/2022     4 DOS: the patient was seen and examined on 01/31/2022   Brief hospital course: 61 year old female with past medical history of metastatic breast cancer to bone, lungs, lung pleura and lymph nodes.  She was recently started on oxygen as outpatient prior to that admission.  She was recently in the hospital from 11/29 to 12/3 and treated for pneumonia.  Patient readmitted on 01/27/2022 with pneumonia and pleural effusions.  Thoracentesis on 01/28/2022 remove 950 mL underneath the right lung.  Thoracentesis on 12/7 removed 800 mL underneath the left lung.  The patient was having fevers at night but temperature curve coming down.  Antibiotics changed from Zosyn over to Unasyn.  Patient had left shoulder pain on 01/31/2022.  Patient will have a steroid injection in the shoulder.  Started on systemic steroids on 01/31/2022 for wheezing.  Assessment and Plan: * Acute on chronic respiratory failure with hypoxia (HCC) O2 sat 88% on home flow rate of 3.5, now on bubble high flow nasal cannula 7 L.  Patient elects to be DNR/DNI (okay to do pressors and BiPAP if needed).  Trying to set up two oxygen concentrator's at home.  Started Solu-Medrol on 01/31/2022.    Obstructive pneumonia Patient recently received Rocephin and Zithromax.  Patient was started on vancomycin and meropenem initially.  Since MRSA PCR is negative vancomycin was discontinued.  Zosyn switched over to Unasyn on 01/30/2022.  Viral respiratory panel negative.  Temperature curve trending better.  Not sure if this is tumor fever or not.  With wheezing today we will start steroid.  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  Patient had 550 mL of fluid removed underneath the left lung on 01/23/2022.  800 mL removed from underneath the left lung on 01/29/2022.  Will likely need another thoracentesis prior  to getting home.  Malignant neoplasm of upper-outer quadrant of right breast in female, estrogen receptor positive ([place]) Patient has widespread metastatic disease mainly with lung nodules, mediastinal and hilar adenopathy.  Patient also has bilateral pleural effusions. Worsening pulmonary mets on CT and new metastasis on spleen seen.   Anemia, chronic disease Hemoglobin down to 9.3.  Continue to monitor.  Pain Case discussed with pharmacist and will give scheduled Roxanol today and then depending on how she does with that may be able to do Duragesic patch or some long-acting pain medications upon disposition.  Left leg pain Left leg sonogram negative for DVT  Hypophosphatemia Replace IV K-Phos today  Hypomagnesemia Replaced into the normal range  Thrush Continue Diflucan  Left shoulder pain Case discussed with Dr. Roland Rack and he will do a steroid injection.  Hyponatremia Likely related to malignancy.  Hypokalemia We will give oral supplementation.  And IV K-Phos today  Nausea As needed nausea medication.  Mild intermittent asthma Albuterol as needed        Subjective: Patient not asking for pain medications often.  Patient still having pain.  Poor appetite and not eating very much.  Still having shoulder pain.  Patient wheezing today.  Patient having some mouth numbness and pain.  Still having some difficulty swallowing.  Having some left leg cramping type pains.  Physical Exam: Vitals:   01/31/22 0455 01/31/22 0821 01/31/22 0913 01/31/22 1359  BP: (!) 113/57  123/65 110/67  Pulse: (!) 124  (!) 119 (!) 118  Resp: 20  20 16   Temp: 98.5  F (36.9 C)  99 F (37.2 C) 98.5 F (36.9 C)  TempSrc: Oral  Oral Oral  SpO2: 90% 91% 93% 92%  Weight:      Height:       Physical Exam HENT:     Head: Normocephalic.     Mouth/Throat:     Pharynx: No oropharyngeal exudate.     Comments: Thrush on tongue Eyes:     General: Lids are normal.     Conjunctiva/sclera:  Conjunctivae normal.  Cardiovascular:     Rate and Rhythm: Regular rhythm. Tachycardia present.     Heart sounds: Normal heart sounds, S1 normal and S2 normal.  Pulmonary:     Breath sounds: Examination of the right-middle field reveals wheezing. Examination of the left-middle field reveals wheezing. Examination of the right-lower field reveals decreased breath sounds and rhonchi. Examination of the left-lower field reveals decreased breath sounds and rhonchi. Decreased breath sounds, wheezing and rhonchi present. No rales.  Abdominal:     Palpations: Abdomen is soft.     Tenderness: There is no abdominal tenderness.  Musculoskeletal:     Right lower leg: No swelling.     Left lower leg: No swelling.  Skin:    General: Skin is warm.     Findings: No rash.  Neurological:     Mental Status: She is alert and oriented to person, place, and time.     Data Reviewed: Sodium 132, potassium 3.0, creatinine 0.53, phosphorus 2.1, hemoglobin 9.3  Family Communication: Spoke with patient's daughter at the bedside  Disposition: Status is: Inpatient Remains inpatient appropriate because: The goal will be to try to get the patient home before Tuesday's chemotherapy.  Likely will need a thoracentesis on Monday.  Setting up to oxygen concentrator's and hospital bed. Planned Discharge Destination: Home with Home Health    Time spent: 29 minutes Case discussed with orthopedic surgery and oncology.  Case discussed with pharmacist.  Author: Loletha Grayer, MD 01/31/2022 2:00 PM  For on call review [email or web address].

## 2022-01-31 NOTE — Assessment & Plan Note (Signed)
Replaced. °

## 2022-01-31 NOTE — Progress Notes (Signed)
Patient is alert and oriented x4. Complained of Left arm/shoulder pain #7 x2 and received morphine IV x2-does has ortho consulted. Patient stated that she ambulated to [facility] with the help of her sister at the bedside. Had nausea and received zofran this morning. Gave her CHG wipes and a new gown-pt stated that her sister will assist her with CHG wipes and changing into a clean gown. Denied additional needs.

## 2022-01-31 NOTE — Assessment & Plan Note (Signed)
Left leg sonogram negative for DVT

## 2022-01-31 NOTE — Progress Notes (Signed)
Patient ID: Amanda Skinner, female   DOB: Jun 30, 1960, 61 y.o.   MRN: [id number]  Subjective: The patient notes continued moderate pain in the left shoulder which she feels is unchanged as compared to last night's evaluation/examination.  She continues to have difficulty raising her arm even to shoulder level. She denies any numbness or paresthesias down her arm to her hand.  Objective: Vital signs in last 24 hours: Temp:  [98 F (36.7 C)-99.5 F (37.5 C)] 99 F (37.2 C) (12/09 0913) Pulse Rate:  [110-124] 119 (12/09 0913) Resp:  [20-22] 20 (12/09 0913) BP: (92-123)/(57-65) 123/65 (12/09 0913) SpO2:  [90 %-93 %] 93 % (12/09 0913)  Intake/Output from previous day: 12/08 0701 - 12/09 0700 In: -  Out: 450 [Urine:450] Intake/Output this shift: No intake/output data recorded.  Recent Labs    01/29/22 0525 01/31/22 0500  HGB 9.7* 9.3*   Recent Labs    01/29/22 0525 01/31/22 0500  WBC 9.8 9.4  RBC 2.96* 2.88*  HCT 28.2* 27.2*  PLT 218 255   Recent Labs    01/30/22 0530 01/31/22 0500  NA 131* 132*  K 3.0* 3.0*  CL 92* 90*  CO2 34* 34*  BUN 6* 5*  CREATININE 0.54 0.53  GLUCOSE 122* 128*  CALCIUM 8.9 8.6*   No results for input(s): "LABPT", "INR" in the last 72 hours.  Physical Exam: Orthopedic examination again is limited to the left shoulder and upper extremity.  Her findings are unchanged as compared to yesterday evening's examination.  She remains neurovascularly intact to the left upper extremity and hand.  Assessment: Impingement/tendinopathy left shoulder with probable rotator cuff tear.  Plan: The treatment options again have been reviewed with the patient.  The patient's hospitalist, Dr. Leslye Peer, has let me know that it is okay to proceed with a steroid injection into the patient's left shoulder as he has received the greenlight from the patient's covering oncologist.  Therefore, after obtaining verbal consent from the patient, the left subacromial space is  injected sterilely using a solution of 1 cc of Kenalog 40 and 4 cc of 0.5% plain Sensorcaine.  The patient tolerated the procedure well.  The patient may continue to be mobilized as symptoms permit, and may receive pain medication as deemed appropriate medically.   Marshall Cork Gregory Dowe 01/31/2022, 12:29 PM

## 2022-01-31 NOTE — Plan of Care (Signed)

## 2022-01-31 NOTE — Progress Notes (Signed)
Nimisha from lab called stating that the quantiferon tb gold labs that I dre will have to be reordered for Monday because [facility] does not accept specimens on the weekend. Will pass on to day team. Lab reordered.

## 2022-01-31 NOTE — Progress Notes (Signed)
Mobility Specialist - Progress Note     01/31/22 1527  Mobility  Activity Ambulated with assistance in hallway;Stood at bedside  Level of Assistance Contact guard assist, steadying assist  Assistive Device None  Distance Ambulated (ft) 140 ft  Activity Response Tolerated well  Mobility Referral Yes  $Mobility charge 1 Mobility   Pt resting in bed on 7L upon entry. Pt STS and ambulates in hallway around NS CGA for safety. Pt returned to bed and left with needs in reach. Pt stayed between 92-94 during the entire mobility session. Plan to ween to 6L tomorrow. Asked RN permission.   [name] Mobility Specialist 01/31/22, 3:35 PM

## 2022-02-01 ENCOUNTER — Inpatient Hospital Stay: Payer: 59

## 2022-02-01 DIAGNOSIS — J9621 Acute and chronic respiratory failure with hypoxia: Secondary | ICD-10-CM | POA: Diagnosis not present

## 2022-02-01 DIAGNOSIS — C50411 Malignant neoplasm of upper-outer quadrant of right female breast: Secondary | ICD-10-CM | POA: Diagnosis not present

## 2022-02-01 DIAGNOSIS — D638 Anemia in other chronic diseases classified elsewhere: Secondary | ICD-10-CM

## 2022-02-01 DIAGNOSIS — R202 Paresthesia of skin: Secondary | ICD-10-CM

## 2022-02-01 DIAGNOSIS — J189 Pneumonia, unspecified organism: Secondary | ICD-10-CM | POA: Diagnosis not present

## 2022-02-01 DIAGNOSIS — R200 Anesthesia of skin: Secondary | ICD-10-CM

## 2022-02-01 DIAGNOSIS — J910 Malignant pleural effusion: Secondary | ICD-10-CM | POA: Diagnosis not present

## 2022-02-01 LAB — BASIC METABOLIC PANEL
Anion gap: 4 — ABNORMAL LOW (ref 5–15)
BUN: 11 mg/dL (ref 8–23)
CO2: 37 mmol/L — ABNORMAL HIGH (ref 22–32)
Calcium: 9.2 mg/dL (ref 8.9–10.3)
Chloride: 97 mmol/L — ABNORMAL LOW (ref 98–111)
Creatinine, Ser: 0.59 mg/dL (ref 0.44–1.00)
GFR, Estimated: >60 mL/min (ref >60)
Glucose, Bld: 144 mg/dL — ABNORMAL HIGH (ref 70–99)
Potassium: 3.5 mmol/L (ref 3.5–5.1)
Sodium: 138 mmol/L (ref 135–145)

## 2022-02-01 LAB — CBC
HCT: 26.3 % — ABNORMAL LOW (ref 36.0–46.0)
Hemoglobin: 8.7 g/dL — ABNORMAL LOW (ref 12.0–15.0)
MCH: 31.6 pg (ref 26.0–34.0)
MCHC: 33.1 g/dL (ref 30.0–36.0)
MCV: 95.6 fL (ref 80.0–100.0)
Platelets: 271 10*3/uL (ref 150–400)
RBC: 2.75 MIL/uL — ABNORMAL LOW (ref 3.87–5.11)
RDW: 15 % (ref 11.5–15.5)
WBC: 9.3 10*3/uL (ref 4.0–10.5)
nRBC: 0 % (ref 0.0–0.2)

## 2022-02-01 LAB — FERRITIN: Ferritin: 316 ng/mL — ABNORMAL HIGH (ref 11–307)

## 2022-02-01 LAB — PHOSPHORUS: Phosphorus: 3.4 mg/dL (ref 2.5–4.6)

## 2022-02-01 LAB — CULTURE, BLOOD (ROUTINE X 2)
Culture: NO GROWTH
Culture: NO GROWTH
Special Requests: ADEQUATE
Special Requests: ADEQUATE

## 2022-02-01 MED ORDER — GADOBUTROL 1 MMOL/ML IV SOLN
6.0000 mL | Freq: Once | INTRAVENOUS | Status: AC | PRN
  Administered 2022-02-01: 6 mL via INTRAVENOUS

## 2022-02-01 NOTE — Progress Notes (Signed)
Mobility Specialist - Progress Note    02/01/22 1657  Mobility  Activity Ambulated with assistance to bathroom;Stood at bedside  Level of Assistance Contact guard assist, steadying assist  Assistive Device None  Distance Ambulated (ft) 10 ft  Activity Response Tolerated well  Mobility Referral Yes  $Mobility charge 1 Mobility   Pt resting on 9L upon entry. Pt STS and ambulates CGA to bathroom on 10L. Pt returned to bed and left with needs in reach. RN and family present in room.   [name] Mobility Specialist 02/01/22, 4:59 PM

## 2022-02-01 NOTE — Progress Notes (Signed)
Patient ID: Amanda Skinner, female   DOB: March 02, 1960, 61 y.o.   MRN: [id number]  Subjective: The patient notes near complete resolution of her left shoulder pain following the steroid injection administered yesterday.  She is able to lean on her left side and is able to raise her arm above shoulder level.  She has no new complaints pertaining to her left shoulder at this time.   Objective: Vital signs in last 24 hours: Temp:  [98.2 F (36.8 C)-98.8 F (37.1 C)] 98.2 F (36.8 C) (12/10 1242) Pulse Rate:  [105-118] 112 (12/10 1242) Resp:  [16-20] 16 (12/10 1242) BP: (105-141)/(67-92) 120/79 (12/10 1242) SpO2:  [88 %-93 %] 92 % (12/10 1404)  Intake/Output from previous day: 12/09 0701 - 12/10 0700 In: 480 [P.O.:480] Out: 1700 [Urine:1700] Intake/Output this shift: Total I/O In: -  Out: 600 [Urine:600]  Recent Labs    01/31/22 0500 02/01/22 0600  HGB 9.3* 8.7*   Recent Labs    01/31/22 0500 02/01/22 0600  WBC 9.4 9.3  RBC 2.88* 2.75*  HCT 27.2* 26.3*  PLT 255 271   Recent Labs    01/31/22 0500 02/01/22 0600  NA 132* 138  K 3.0* 3.5  CL 90* 97*  CO2 34* 37*  BUN 5* 11  CREATININE 0.53 0.59  GLUCOSE 128* 144*  CALCIUM 8.6* 9.2   No results for input(s): "LABPT", "INR" in the last 72 hours.  Physical Exam: Orthopedic examination is limited to the shoulder and upper extremity.  Skin inspection left shoulder is unremarkable.  No swelling, erythema, ecchymosis, abrasions, or other skin abnormalities are identified.  There is no tenderness to the fascia over the anterior or lateral aspects of the shoulder.  She is able to forward flex to 160 degrees and abduct to 160 degrees without pain.  She exhibits 3+-4/5 strength with gentle resisted forward flexion and abduction, and 4-4+/5 strength with resisted internal and external rotation.  She is neurovascularly intact in the left upper extremity and hand.  Assessment: Impingement/tendinopathy with possible rotator cuff  tear, left shoulder, improved symptomatically.  Plan: The patient may continue with her normal daily activities, but is to avoid offending activities.  She may take over-the-counter medications as needed for discomfort.  I will sign off at this time.  If you have further need of orthopedic input during this hospitalization, please reconsult me.   Marshall Cork Jezebelle Ledwell 02/01/2022, 2:23 PM

## 2022-02-01 NOTE — Progress Notes (Signed)
Progress Note   Patient: Amanda Skinner QPY:[id number] DOB: Aug 12, 1960 DOA: 01/27/2022     5 DOS: the patient was seen and examined on 02/01/2022   Brief hospital course: 61 year old female with past medical history of metastatic breast cancer to bone, lungs, lung pleura and lymph nodes.  She was recently started on oxygen as outpatient prior to that admission.  She was recently in the hospital from 11/29 to 12/3 and treated for pneumonia.  Patient readmitted on 01/27/2022 with pneumonia and pleural effusions.  Thoracentesis on 01/28/2022 remove 950 mL underneath the right lung.  Thoracentesis on 12/7 removed 800 mL underneath the left lung.  The patient was having fevers at night but temperature curve coming down.  Antibiotics changed from Zosyn over to Unasyn.  Patient had left shoulder pain on 01/31/2022.  Patient will have a steroid injection in the shoulder.  Started on systemic steroids on 01/31/2022 for wheezing.  Assessment and Plan: * Acute on chronic respiratory failure with hypoxia (HCC) O2 sat 88% on home flow rate of 3.5.  Patient had to go from 7 L high flow nasal cannula up to 12 L this morning, now down to 9 L..  Patient elects to be DNR/DNI (okay to do pressors and BiPAP if needed).  Need to set up two oxygen concentrator's at home.  Started Solu-Medrol on 01/31/2022.    Obstructive pneumonia Patient recently received Rocephin and Zithromax.  Patient was started on vancomycin and meropenem initially.  Since MRSA PCR is negative vancomycin was discontinued.  Zosyn switched over to Unasyn on 01/30/2022.  Viral respiratory panel negative.  Temperature curve trending better.  Continue steroid today.  Repeat chest x-ray shows moderate opacification of the left lung base with slightly worsening in the left lung base.  Findings compatible with known effusion/atelectasis as well as pulmonary metastatic disease.  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  Patient had 550 mL of fluid removed underneath the left lung on 01/23/2022.  800 mL removed from underneath the left lung on 01/29/2022.  Ordered for another thoracentesis for 02/02/2022.  Malignant neoplasm of upper-outer quadrant of right breast in female, estrogen receptor positive ([facility]) Patient has widespread metastatic disease mainly with lung nodules, mediastinal and hilar adenopathy.  Patient also has bilateral pleural effusions. Worsening pulmonary mets on CT and new metastasis on spleen seen.  Trying to get stable enough to go home to receive outpatient chemotherapy on 02/03/2022.   Numbness and tingling of right face Will get an MRI of the brain with and without contrast to rule out metastatic disease and/or stroke.  Anemia, chronic disease Hemoglobin down to 8.7.  Continue to monitor.  Pain Continue Solu-Medrol.  Continue Roxanol.  Left leg pain Left leg sonogram negative for DVT  Hypophosphatemia Replaced yesterday.  Hypomagnesemia Replaced into the normal range  Thrush Continue Diflucan  Left shoulder pain Patient able to move left shoulder around better today after steroid injection yesterday.  Hyponatremia Sodium in the normal range  Hypokalemia Improved but will still give oral supplementation.  Nausea As needed nausea medication.  Mild intermittent asthma Albuterol as needed        Subjective: Patient ate a little bit better yesterday.  Complains of some right facial numbness.  She states this feels like she is chewing the inside of her mouth.  Able to move her left arm better today.  Increased oxygen requirements this morning.  Physical Exam: Vitals:   02/01/22 0917 02/01/22  1146 02/01/22 1242 02/01/22 1404  BP:   120/79   Pulse:   (!) 112   Resp:   16   Temp:   98.2 F (36.8 C)   TempSrc:   Oral   SpO2: 91% 92% 93% 92%  Weight:      Height:       Physical Exam HENT:     Head: Normocephalic.      Mouth/Throat:     Pharynx: No oropharyngeal exudate.     Comments: Thrush on tongue Eyes:     General: Lids are normal.     Conjunctiva/sclera: Conjunctivae normal.  Cardiovascular:     Rate and Rhythm: Regular rhythm. Tachycardia present.     Heart sounds: Normal heart sounds, S1 normal and S2 normal.  Pulmonary:     Breath sounds: Examination of the right-middle field reveals decreased breath sounds and rhonchi. Examination of the left-middle field reveals decreased breath sounds and rhonchi. Examination of the right-lower field reveals decreased breath sounds and rhonchi. Examination of the left-lower field reveals decreased breath sounds and rhonchi. Decreased breath sounds and rhonchi present. No wheezing or rales.  Abdominal:     Palpations: Abdomen is soft.     Tenderness: There is no abdominal tenderness.  Musculoskeletal:     Right lower leg: No swelling.     Left lower leg: No swelling.  Skin:    General: Skin is warm.     Findings: No rash.  Neurological:     Mental Status: She is alert and oriented to person, place, and time.     Data Reviewed: Hemoglobin 8.7, white blood cell count 9.3, creatinine 0.59, sodium 138, potassium 3.5  Family Communication: Spoke with patient's daughter and husband at the bedside  Disposition: Status is: Inpatient Remains inpatient appropriate because: Had increasing oxygen requirements.  Will obtain an MRI of the brain with right-sided numbness around her lips.  Planned Discharge Destination: Home with Home Health  Time spent: 27 minutes  Author: Loletha Grayer, MD 02/01/2022 2:57 PM  For on call review [email or web address].

## 2022-02-01 NOTE — Progress Notes (Addendum)
Mobility Specialist - Progress Note    02/01/22 1649  Mobility  Activity Ambulated with assistance in hallway;Stood at bedside  Level of Assistance Contact guard assist, steadying assist  Assistive Device None  Distance Ambulated (ft) 200 ft  Activity Response Tolerated well  Mobility Referral Yes  $Mobility charge 1 Mobility   Pt resting in bed on 9L upon entry. Pt STS and ambulates in hallway around NS CGA; initially on 8L and moved to 10L after pt desat to 84%. Pt used HHA during mobility session. Pt returned to bed and left with needs in reach. Rn and family present in room.   [name] Mobility Specialist 02/01/22, 4:57 PM

## 2022-02-01 NOTE — Progress Notes (Signed)
Tele called stating that patient's oxygen saturation was 84%. I went into room  and rechecked patient's oxygen manually and got 88% in which her baseline is 88-90%. Repositioned patient so that continuous pulse ox could read better. Patient was slumped over to the right side. Pulse ox reading better now.

## 2022-02-01 NOTE — Assessment & Plan Note (Addendum)
Will get an MRI of the brain with and without contrast to rule out metastatic disease and/or stroke.

## 2022-02-01 NOTE — TOC Progression Note (Signed)
Transition of Care [facility]) - Progression Note    Patient Details  Name: Amanda Skinner MRN: [id number] Date of Birth: 21-Apr-1960  Transition of Care [facility]) CM/SW Contact  Zigmund Daniel Dorian Pod, RN Phone [phone] 02/01/2022, 12:08 PM  Clinical Narrative:    Spoke with pt's daughter bedside Sheena concerning recommendations for HHPT. Open to any HHealth agencies preferably in the [place] area. 12:02 pm-[facility]) accepted pt for services for HHPT upon her discharge. Pt will be discharged to the daughter's home [facility]ena at  [address] [place], [place] [address]. Above information was provided to [facility] for home visits.  TOC team will continue to follow pt for any additional discharge plans.        Expected Discharge Plan and Services                                                 Social Determinants of Health (SDOH) Interventions    Readmission Risk Interventions     No data to display

## 2022-02-02 ENCOUNTER — Ambulatory Visit: Payer: 59

## 2022-02-02 ENCOUNTER — Ambulatory Visit: Payer: 59 | Admitting: Oncology

## 2022-02-02 ENCOUNTER — Inpatient Hospital Stay: Payer: 59

## 2022-02-02 ENCOUNTER — Other Ambulatory Visit: Payer: 59

## 2022-02-02 DIAGNOSIS — Z515 Encounter for palliative care: Secondary | ICD-10-CM

## 2022-02-02 DIAGNOSIS — C50911 Malignant neoplasm of unspecified site of right female breast: Secondary | ICD-10-CM

## 2022-02-02 DIAGNOSIS — J910 Malignant pleural effusion: Secondary | ICD-10-CM | POA: Diagnosis not present

## 2022-02-02 DIAGNOSIS — R52 Pain, unspecified: Secondary | ICD-10-CM

## 2022-02-02 DIAGNOSIS — J9621 Acute and chronic respiratory failure with hypoxia: Secondary | ICD-10-CM | POA: Diagnosis not present

## 2022-02-02 DIAGNOSIS — J189 Pneumonia, unspecified organism: Secondary | ICD-10-CM | POA: Diagnosis not present

## 2022-02-02 LAB — BASIC METABOLIC PANEL
Anion gap: 8 (ref 5–15)
BUN: 11 mg/dL (ref 8–23)
CO2: 33 mmol/L — ABNORMAL HIGH (ref 22–32)
Calcium: 10 mg/dL (ref 8.9–10.3)
Chloride: 100 mmol/L (ref 98–111)
Creatinine, Ser: 0.54 mg/dL (ref 0.44–1.00)
GFR, Estimated: >60 mL/min (ref >60)
Glucose, Bld: 134 mg/dL — ABNORMAL HIGH (ref 70–99)
Potassium: 3.5 mmol/L (ref 3.5–5.1)
Sodium: 141 mmol/L (ref 135–145)

## 2022-02-02 LAB — CBC
HCT: 30.6 % — ABNORMAL LOW (ref 36.0–46.0)
Hemoglobin: 9.9 g/dL — ABNORMAL LOW (ref 12.0–15.0)
MCH: 31.3 pg (ref 26.0–34.0)
MCHC: 32.4 g/dL (ref 30.0–36.0)
MCV: 96.8 fL (ref 80.0–100.0)
Platelets: 329 10*3/uL (ref 150–400)
RBC: 3.16 MIL/uL — ABNORMAL LOW (ref 3.87–5.11)
RDW: 15.4 % (ref 11.5–15.5)
WBC: 14.3 10*3/uL — ABNORMAL HIGH (ref 4.0–10.5)
nRBC: 0 % (ref 0.0–0.2)

## 2022-02-02 MED ORDER — QUETIAPINE FUMARATE 25 MG PO TABS
12.5000 mg | ORAL_TABLET | Freq: Every evening | ORAL | Status: DC | PRN
  Administered 2022-02-02: 12.5 mg via ORAL
  Filled 2022-02-02: qty 1

## 2022-02-02 MED ORDER — LIDOCAINE HCL (PF) 1 % IJ SOLN
5.0000 mL | Freq: Once | INTRAMUSCULAR | Status: AC
  Administered 2022-02-02: 5 mL via INTRADERMAL

## 2022-02-02 MED ORDER — PREDNISONE 20 MG PO TABS
30.0000 mg | ORAL_TABLET | Freq: Every day | ORAL | Status: DC
  Administered 2022-02-03: 30 mg via ORAL
  Filled 2022-02-02: qty 1

## 2022-02-02 MED ORDER — AMOXICILLIN-POT CLAVULANATE 400-57 MG/5ML PO SUSR
800.0000 mg | Freq: Two times a day (BID) | ORAL | Status: DC
  Administered 2022-02-02 – 2022-02-03 (×3): 800 mg via ORAL
  Filled 2022-02-02 (×5): qty 10

## 2022-02-02 MED FILL — Dexamethasone Sodium Phosphate Inj 100 MG/10ML: INTRAMUSCULAR | Qty: 1 | Status: AC

## 2022-02-02 NOTE — Progress Notes (Signed)
OT Cancellation Note  Patient Details Name: Amanda Skinner MRN: [id number] DOB: Mar 09, 1960   Cancelled Treatment:    Reason Eval/Treat Not Completed: Other (comment). OT arrived to room for therapeutic intervention with family present and pt sleeping soundly. Pt's husband reports she has not been sleeping well and would like for her to remain resting at this time. OT will re-attempt at next available time.   Darleen Crocker, MS, OTR/L , CBIS ascom [phone]  02/02/22, 2:39 PM

## 2022-02-02 NOTE — Consult Note (Cosign Needed Addendum)
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 lymph nodes measure up to 1.5 by 2.2 cm (unchanged) lower mediastinal/paraesophageal lymph nodes have increased in size for example image 2/82 measuring 2.2 x 3.3 cm (previously 1.6 x 2.6 cm). Lungs/Pleura: Moderate bilateral pleural effusions have increased from prior. Bilateral pulmonary nodules have increased in size and number for example left upper lobe measuring 2.5 by 2.4 cm (previously 2.0 x 1.5 cm). There is new airspace consolidation in the bilateral lower lobes. There is new confluent masslike opacity in the infrahilar region extending into the inferior right upper lobe. There is complete collapse of the right middle lobe which is new from prior. Upper Abdomen: There is a rounded hypodensity in the spleen measuring 9 mm, new from prior. Musculoskeletal: No chest wall abnormality. No acute or significant osseous findings. IMPRESSION: 1. Worsening pulmonary metastatic disease most significant in the right upper  lobe. 2. Worsening mediastinal and hilar lymphadenopathy. 3.  Increasing moderate bilateral pleural effusions. 4. New airspace disease in the bilateral lower lobes worrisome for pneumonia. 5. New complete collapse of the right middle lobe. 6. New 9 mm hypodensity in the spleen worrisome for metastatic disease. Electronically Signed   By: Ronney Asters M.D.   On: 01/27/2022 18:34   DG Chest Port 1 View  Result Date: 01/27/2022 CLINICAL DATA:  Sepsis.  Stage IV breast cancer. EXAM: PORTABLE CHEST 1 VIEW COMPARISON:  01/23/2022, chest CT 01/21/2022 FINDINGS: Left IJ Port-A-Cath unchanged. Interval worsening opacification over the mid to lower lungs bilaterally compatible with moderate size effusions with associated basilar atelectasis and known metastatic disease. Few small nodular opacities over the mid to upper lungs as seen on recent CT compatible with known metastatic disease. Remainder of the exam is unchanged. IMPRESSION: Interval worsening opacification over the mid to lower lungs bilaterally compatible with moderate size effusions with associated basilar atelectasis and known metastatic disease. Electronically Signed   By: Marin Olp M.D.   On: 01/27/2022 16:33   US THORACENTESIS ASP PLEURAL SPACE W/IMG GUIDE  Result Date: 01/23/2022 INDICATION: Patient with a history of breast cancer and recurrent pleural effusions. Interventional radiology asked to perform a therapeutic thoracentesis. EXAM: ULTRASOUND GUIDED THORACENTESIS MEDICATIONS: 1% lidocaine 10 mL COMPLICATIONS: None immediate. PROCEDURE: An ultrasound guided thoracentesis was thoroughly discussed with the patient and questions answered. The benefits, risks, alternatives and complications were also discussed. The patient understands and wishes to proceed with the procedure. Written consent was obtained. Ultrasound was performed to localize and mark an adequate pocket of fluid in the left chest. The area was then prepped and draped in the  normal sterile fashion. 1% Lidocaine was used for local anesthesia. Under ultrasound guidance a 6 Fr Safe-T-Centesis catheter was introduced. Thoracentesis was performed. The catheter was removed and a dressing applied. FINDINGS: A total of approximately 550 mL of clear yellow fluid was removed. IMPRESSION: Successful ultrasound guided left thoracentesis yielding 550 mL of pleural fluid. Read by: Soyla Dryer, NP Electronically Signed   By: Albin Felling M.D.   On: 01/23/2022 15:03   DG Chest Port 1 View  Result Date: 01/23/2022 CLINICAL DATA:  Status post thoracentesis. EXAM: PORTABLE CHEST 1 VIEW COMPARISON:  Radiographs 01/21/2022 and 01/12/2022.  CT 01/21/2022. FINDINGS: 1435 hours. The patient remains mildly rotated to the right. Left IJ central venous catheter appears unchanged at the level of the lower SVC. The heart size and mediastinal contours are stable. Left pleural effusion has decreased in volume following thoracentesis. No evidence of pneumothorax. Right pleural effusion and bibasilar airspace opacities have not significantly changed. The bones appear unremarkable. IMPRESSION: Decreased left pleural effusion following thoracentesis. No evidence of pneumothorax. No significant change in right pleural effusion and bibasilar airspace opacities. Electronically Signed   By: Richardean Sale M.D.   On: 01/23/2022 14:57   US Venous Img Lower Bilateral (DVT)  Result Date: 01/22/2022 CLINICAL DATA:  Metastatic breast cancer, dyspnea, chest pain, elevated D-dimer EXAM: BILATERAL LOWER EXTREMITY VENOUS DOPPLER ULTRASOUND TECHNIQUE: Gray-scale sonography with compression, as well as color and duplex ultrasound, were performed to evaluate the deep venous system(s) from the level of the common femoral vein through the popliteal and proximal calf veins. COMPARISON:  None Available. FINDINGS: VENOUS Normal compressibility of the common femoral, superficial femoral, and popliteal veins, as well as the  visualized calf veins. Visualized portions of profunda femoral vein and great saphenous vein unremarkable. No filling defects to suggest DVT on grayscale or color Doppler imaging. Doppler  waveforms show normal direction of venous flow, normal respiratory plasticity and response to augmentation. OTHER None. Limitations: none IMPRESSION: 1. Negative. Electronically Signed   By: Fidela Salisbury M.D.   On: 01/22/2022 10:00   CT Angio Chest PE W and/or Wo Contrast  Result Date: 01/21/2022 CLINICAL DATA:  Bilateral breast cancer, shortness of breath EXAM: CT ANGIOGRAPHY CHEST WITH CONTRAST TECHNIQUE: Multidetector CT imaging of the chest was performed using the standard protocol during bolus administration of intravenous contrast. Multiplanar CT image reconstructions and MIPs were obtained to evaluate the vascular anatomy. RADIATION DOSE REDUCTION: This exam was performed according to the departmental dose-optimization program which includes automated exposure control, adjustment of the mA and/or kV according to patient size and/or use of iterative reconstruction technique. CONTRAST:  75 mL OMNIPAQUE IOHEXOL 350 MG/ML SOLN COMPARISON:  11/27/2021 FINDINGS: Cardiovascular: Satisfactory opacification of the pulmonary arteries to the segmental level. No evidence of pulmonary embolism. Mild cardiomegaly. Small pericardial effusion, about 9 mm in thickness. Heart size. No pericardial effusion. Mediastinum/Nodes: Extensive suspicious mediastinal, hilar, axillary and subdiaphragmatic adenopathy is seen which represents progression. The largest hilar node is on the right measuring 3.3 cm. Mediastinal adenopathy is confluent. Supraclavicular masses on the left up to 2.2 cm consistent with metastasis. There is a left axillary mass that measures 3.6 cm as well as numerous additional suspicious left axillary nodes. Lungs/Pleura: There is extensive bibasilar consolidation with moderate bilateral pleural effusions and numerous  pulmonary nodules and masses which represents significant interval progression of disease. The largest discrete lung mass on the right in the lower lobe is 4.5 cm. Right upper lobe area of consolidation in the and mass is about 5 cm. There are innumerable additional nodules throughout both lungs are of varying sizes. Upper Abdomen: Imaged portions of the upper abdominal organs are unremarkable. Musculoskeletal: Thoracic degenerative changes noted. Left-sided Port-A-Cath tip is in the distal SVC. Review of the MIP images confirms the above findings. IMPRESSION: 1. No evidence of pulmonary embolism, aortic aneurysm or dissection. 2. Marked progression of disease with increase in size and number of lung nodules as well as extensive mediastinal, hilar, subdiaphragmatic, supraclavicular and left axillary adenopathy. 3. Bilateral pleural effusions. 4. Areas of alveolar pulmonary parenchymal consolidation could represent superimposed pneumonia. Electronically Signed   By: Sammie Bench M.D.   On: 01/21/2022 18:36   DG Chest 2 View  Result Date: 01/21/2022 CLINICAL DATA:  Shortness of breath. History of metastatic breast cancer. EXAM: CHEST - 2 VIEW COMPARISON:  Chest x-ray dated January 12, 2022. FINDINGS: Unchanged left chest wall port catheter. The heart size and mediastinal contours are within normal limits. Increased small bilateral pleural effusions with worsening aeration at both lung bases. Multiple pulmonary nodules in both lungs are not significantly changed. No pneumothorax. No acute osseous abnormality. IMPRESSION: 1. Increased small bilateral pleural effusions with worsening bibasilar atelectasis and/or pneumonia. 2. Unchanged pulmonary metastatic disease. Electronically Signed   By: Titus Dubin M.D.   On: 01/21/2022 13:34   [place] CHEST (PLEURAL EFFUSION)  Result Date: 01/14/2022 CLINICAL DATA:  61 year old female with recurrent pleural effusion EXAM: CHEST ULTRASOUND COMPARISON:  01/02/2022  FINDINGS: Ultrasound performed with potential thoracentesis. Ultrasound demonstrates small pleural effusion on the right and the left. Results were shared with the patient who deferred thoracentesis at this time. IMPRESSION: Small bilateral pleural effusions. Electronically Signed   By: Corrie Mckusick D.O.   On: 01/14/2022 16:26   DG Shoulder Left  Result Date: 01/14/2022 CLINICAL DATA:  61 year old female with sudden  onset sharp pain radiating to the arm pit and chest. Metastatic breast cancer. EXAM: LEFT SHOULDER - 2+ VIEW COMPARISON:  Left humerus series today. Recent chest radiographs 01/12/2022. FINDINGS: No glenohumeral joint dislocation. Proximal left humerus appears intact. Bone mineralization is within normal limits for age. No acute osseous abnormality identified. Left chest power port and pleural effusion do not appear significantly changed from 01/12/2022. Visible left ribs appear grossly intact. IMPRESSION: 1. No osseous abnormality identified about the left shoulder. 2. Left pleural effusion not significantly changed from recent 01/12/2022 radiographs. Electronically Signed   By: Genevie Ann M.D.   On: 01/14/2022 10:29   DG Humerus Left  Result Date: 01/14/2022 CLINICAL DATA:  61 year old female with sudden onset sharp pain radiating to the arm pit and chest. EXAM: LEFT HUMERUS - 2+ VIEW COMPARISON:  Left forearm series today. FINDINGS: Bone mineralization is within normal limits. Alignment appears grossly maintained at the left shoulder and elbow. There is no evidence of fracture or other focal bone lesions. Soft tissues are unremarkable. Partially visible left lung base veiling opacity suggesting pleural effusion. Left chest power port partially visible. IMPRESSION: 1. No osseous abnormality identified about the left humerus. 2. Partially visible left pleural effusion, Port-A-Cath. Electronically Signed   By: Genevie Ann M.D.   On: 01/14/2022 10:26   DG Forearm Left  Result Date:  01/14/2022 CLINICAL DATA:  61 year old female with sudden onset sharp pain radiating to the arm pit and chest. EXAM: LEFT FOREARM - 2 VIEW COMPARISON:  None Available. FINDINGS: Bone mineralization is within normal limits. There is no evidence of fracture or other focal bone lesions. No joint effusion is evident at the left elbow. Soft tissue contours appear within normal limits. IMPRESSION: Negative. Electronically Signed   By: Genevie Ann M.D.   On: 01/14/2022 10:25   DG Chest 2 View  Result Date: 01/12/2022 CLINICAL DATA:  Shortness of breath. History of pleural effusion. History of breast cancer. EXAM: CHEST - 2 VIEW COMPARISON:  Chest two views 01/02/2022 and 01/01/2022; CT chest abdomen and pelvis 11/27/2021 FINDINGS: Cardiac silhouette and mediastinal contours are within normal limits. Left chest wall porta catheter tip overlies the central superior vena cava. Mild-to-moderate left and trace right pleural effusions are not significantly changed. Approximately 2 cm left lateral midlung nodular density appears unchanged from 01/01/2022 frontal radiograph. The more accurate measurement on CT on 11/27/2021 was up to approximately 1.2 cm. Of the right mid to lower lung smaller nodules as better seen on prior CT. Posteroinferior opacity on lateral view corresponding to the dominant 4 cm posteroinferior left lower lobe mass on prior CT. No pneumothorax.  Mild bilateral lower lung interstitial thickening. Moderate multilevel degenerative disc changes of the thoracic spine. IMPRESSION: 1. Mild-to-moderate left and trace right pleural effusions are not significantly changed from 01/02/2022. 2. Posteroinferior opacity on lateral view corresponding to the dominant 4 cm posteroinferior left lower lobe mass on prior CT. Additional bilateral pulmonary nodules again consistent with pulmonary metastases. Electronically Signed   By: Yvonne Kendall M.D.   On: 01/12/2022 16:37   MR Brain W Wo Contrast  Result Date:  01/07/2022 CLINICAL DATA:  Evaluate for metastatic disease. Breast cancer staging EXAM: MRI HEAD WITHOUT AND WITH CONTRAST TECHNIQUE: Multiplanar, multiecho pulse sequences of the brain and surrounding structures were obtained without and with intravenous contrast. CONTRAST:  32m GADAVIST GADOBUTROL 1 MMOL/ML IV SOLN COMPARISON:  06/18/2021 FINDINGS: Brain: No enhancement or swelling to suggest metastatic disease. Mild chronic small vessel ischemia in  the cerebral white matter and pons. No acute or subacute infarct, hemorrhage, hydrocephalus, or collection Vascular: Normal flow voids and vascular enhancements Skull and upper cervical spine: Posterior scalp nodule closely associated with the right para median calvarium, see 16:113, not seen on priors but also not hypermetabolic on recent PET CT. The nodule measures 10 x 3 mm, attention on follow-up. Sinuses/Orbits: Negative IMPRESSION: 1. Negative for metastatic disease to the brain. 2. New, small posterior scalp nodule which would be concerning for a metastatic deposit but was not hypermetabolic on recent PET, question recent trauma. Electronically Signed   By: Jorje Guild M.D.   On: 01/07/2022 12:38    PERFORMANCE STATUS (ECOG) : 2 - Symptomatic, <50% confined to bed  Review of Systems Unless otherwise noted, a complete review of systems is negative.  Physical Exam General: NAD Pulmonary: unlabored, on O2 Extremities: no edema, no joint deformities Skin: no rashes Neurological: Weakness but otherwise nonfocal  IMPRESSION: I met with patient, husband, and both daughters today to discuss goals.  Patient was quite drowsy during my conversation and reports that she did not sleep well last night.  Additionally, she has been having intermittent visual hallucinations.  O2 requirements have increased today and patient is now on HFNC.  Patient asked about options should she forego plan to pursue chemotherapy.  Unfortunately, it appears increasingly  likely that she does not have a viable path to pursue chemotherapy.  We discussed options for hospice either at home or hospice IPU.  We also had a lengthy conversation regarding symptom management at end-of-life.  Family and patient considering options.  Symptomatically, given insomnia and hallucinations, will discontinue mirtazapine and start low-dose Seroquel at bedtime.  PLAN: -Continue current scope of treatment -Start Seroquel 12.5 mg nightly as needed -Will follow  Case and plan discussed with Dr. [name]   Time Total: 60 minutes  Visit consisted of counseling and education dealing with the complex and emotionally intense issues of symptom management and palliative care in the setting of serious and potentially life-threatening illness.Greater than 50%  of this time was spent counseling and coordinating care related to the above assessment and plan.  Signed by: Altha Harm, PhD, NP-C

## 2022-02-02 NOTE — Procedures (Signed)
PROCEDURE SUMMARY:  Successful image-guided right thoracentesis. Yielded 650cc of clear yellow fluid. Pt tolerated procedure well. No immediate complications. EBL = trace   Specimen was not sent for labs. CXR ordered.  Please see imaging section of Epic for full dictation.  Lura Em PA-C 02/02/2022 11:49 AM

## 2022-02-02 NOTE — TOC Progression Note (Addendum)
Transition of Care [facility]) - Progression Note    Patient Details  Name: Amanda Skinner MRN: [id number] Date of Birth: Dec 15, 1960  [facility]) CM/SW [place], [place] Phone Number: 02/02/2022, 10:26 AM  Clinical Narrative:      CSW spoke with patient's daughter [name] at [phone]   She reports hospital bed was already ordered and delivered. Confirmed patient will be discharging to her home located at [address] [place], [place] [address]   She reports at baseline patient is on 2.5L of home O2 with [employer]. Patient will be discharging on 7L per MD. Orders for dme O2 put in on 12/8.   CSW has reached out to [place] with [employer], informed of increase O2 needs, patient will also need O2 delivered to hospital room for dc as family will pick patient up. Cyril Mourning also aware of discharge address above.   Patient is set up with [facility] PT.  Per MD thoracentesis today, pending medical readiness to dc.       Expected Discharge Plan and Services                                                 Social Determinants of Health (SDOH) Interventions    Readmission Risk Interventions     No data to display

## 2022-02-02 NOTE — Progress Notes (Signed)
Mobility Specialist - Progress Note     02/02/22 1005  Mobility  Activity Ambulated with assistance to bathroom;Stood at bedside  Level of Assistance Contact guard assist, steadying assist  Distance Ambulated (ft) 10 ft  Activity Response Tolerated well  Mobility Referral Yes  $Mobility charge 1 Mobility   Pt resting in bed on 9L upon entry. Pt STS and ambulates to bathroom CGA. Pt returned to bed and left with needs in reach. Pt in room with daughter and transport.   [name] Mobility Specialist 02/02/22, 10:06 AM

## 2022-02-02 NOTE — Progress Notes (Signed)
PT Cancellation Note  Patient Details Name: Amanda Skinner MRN: [id number] DOB: 1960/04/02   Cancelled Treatment:    Reason Eval/Treat Not Completed: Other (comment): Pt found sleeping soundly with family requesting pt not be awakened for therapy this date.  Will attempt to see pt at a future date/time as medically appropriate.     Linus Salmons PT, DPT 02/02/22, 3:21 PM

## 2022-02-02 NOTE — Progress Notes (Signed)
Mobility Specialist - Progress Note    02/02/22 1002  Mobility  Activity Ambulated with assistance in hallway;Stood at bedside  Level of Assistance Contact guard assist, steadying assist  Assistive Device Other (Comment) (Iv Pole hold)  Distance Ambulated (ft) 200 ft  Activity Response Tolerated well  Mobility Referral Yes  $Mobility charge 1 Mobility    [employer] Mobility Specialist 02/02/22, 10:04 AM

## 2022-02-02 NOTE — Progress Notes (Signed)
Progress Note   Patient: Amanda Skinner ZOX:[id number] DOB: [birth year] DOA: 01/27/2022     6 DOS: the patient was seen and examined on 02/02/2022   Brief hospital course: 61 year old female with past medical history of metastatic breast cancer to bone, lungs, lung pleura and lymph nodes.  She was recently started on oxygen as outpatient prior to that admission.  She was recently in the hospital from 11/29 to 12/3 and treated for pneumonia.  Patient readmitted on 01/27/2022 with pneumonia and pleural effusions.  Thoracentesis on 01/28/2022 remove 950 mL underneath the right lung.  Thoracentesis on 12/7 removed 800 mL underneath the left lung.  The patient was having fevers at night but temperature curve coming down.  Antibiotics changed from Zosyn over to Unasyn.  Patient had left shoulder pain on 01/31/2022.  Patient will have a steroid injection in the shoulder.  Started on systemic steroids on 01/31/2022 for wheezing.  02/02/2022.  MRI last night showing of brain metastases.  Patient and family wanted to speak with palliative care and oncology to discuss options.  If they decide no further treatments then would be a candidate for hospice.  Assessment and Plan: * Acute on chronic respiratory failure with hypoxia (HCC) O2 sat 88% on home flow rate of 3.5.  Patient was on 7 L high flow nasal cannula for most of the hospital stay then had to go up to 12 L.  This morning was on 9 L.  After thoracentesis was able to go down to 5 L regular nasal cannula..  Patient elects to be DNR/DNI (okay to do pressors and BiPAP if needed).  Need to set up two oxygen concentrator's at home.  Started Solu-Medrol on 01/31/2022.  I will switch over to prednisone for tomorrow.    Obstructive pneumonia Patient recently received Rocephin and Zithromax.  Patient was started on vancomycin and meropenem initially.  Since MRSA PCR is negative vancomycin was discontinued.  Zosyn switched over to Unasyn on 01/30/2022.  Switch to  Augmentin on 02/02/2022.  Primary cancer of right breast with metastasis to other site [facility]) New brain metastases seen on MRI brain on 02/01/2022.  Patient also has metastases to the bone, lung, lung pleura.  Patient to have meetings today with palliative care and oncology to determine next course of action.  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  Patient had 550 mL of fluid removed underneath the left lung on 01/23/2022.  800 mL removed from underneath the left lung on 01/29/2022.  Thoracentesis on 02/02/2022 drew off another 650 mL underneath the right lung.  Numbness and tingling of right face I do not think the MRI of the brain results would explain this.  Malignant neoplasm of upper-outer quadrant of right breast in female, estrogen receptor positive (HCC)    Anemia, chronic disease Hemoglobin up to 9.9.  Continue to monitor.  Pain Switch Solu-Medrol over to prednisone for tomorrow.  Continue Roxanol.  Left leg pain Left leg sonogram negative for DVT  Hypophosphatemia Replaced.  Hypomagnesemia Replaced into the normal range  Thrush Improving on the tongue.  Continue Diflucan  Left shoulder pain Patient able to move left arm around better after steroid injection.  Hyponatremia Sodium in the normal range  Hypokalemia Improved but will still give oral supplementation.  Nausea As needed nausea medication.  Mild intermittent asthma Albuterol as needed        Subjective: Patient and family wanted to meet with the oncology and  palliative care team to talk options at this point in time.  Patient had a thoracentesis today removing 650 mL underneath the right lung.  Tapered from 9 L high flow nasal cannula over to 5 L throughout the day.  Still with some shortness of breath.  Admitted with pneumonia and fever.  Patient seen this morning before thoracentesis.  Physical Exam: Vitals:   02/02/22 1404  02/02/22 1510 02/02/22 1622 02/02/22 1642  BP:   111/69   Pulse:   (!) 109   Resp:   18 20  Temp:   (!) 97.3 F (36.3 C)   TempSrc:   Oral   SpO2: 95% 93% 93%   Weight:      Height:       Physical Exam HENT:     Head: Normocephalic.     Mouth/Throat:     Pharynx: No oropharyngeal exudate.     Comments: Thrush on tongue Eyes:     General: Lids are normal.     Conjunctiva/sclera: Conjunctivae normal.  Cardiovascular:     Rate and Rhythm: Regular rhythm. Tachycardia present.     Heart sounds: Normal heart sounds, S1 normal and S2 normal.  Pulmonary:     Breath sounds: Examination of the right-middle field reveals decreased breath sounds and rhonchi. Examination of the right-lower field reveals decreased breath sounds and rhonchi. Examination of the left-lower field reveals decreased breath sounds and rhonchi. Decreased breath sounds and rhonchi present. No wheezing or rales.  Abdominal:     Palpations: Abdomen is soft.     Tenderness: There is no abdominal tenderness.  Musculoskeletal:     Right lower leg: No swelling.     Left lower leg: No swelling.  Skin:    General: Skin is warm.     Findings: No rash.  Neurological:     Mental Status: She is alert and oriented to person, place, and time.     Data Reviewed: Creatinine 0.54, white blood cell count 14.3, hemoglobin 9.9, platelet count 329  Family Communication: Spoke with family at the bedside and on the phone.  Disposition: Status is: Inpatient Remains inpatient appropriate because: Patient and family interested in speaking about options with the oncology team.  Considering hospice.  Planned Discharge Destination: Pending determination by oncology.    Time spent: 28 minutes Case discussed earlier with oncology and palliative care  Author: Loletha Grayer, MD 02/02/2022 4:56 PM  For on call review [email or web address].

## 2022-02-02 NOTE — Assessment & Plan Note (Signed)
New brain metastases seen on MRI brain on 02/01/2022.  Patient also has metastases to the bone, lung, lung pleura.  Patient to have meetings today with palliative care and oncology to determine next course of action.

## 2022-02-02 NOTE — Progress Notes (Signed)
Pt is being discharged tomorrow on Hospice care

## 2022-02-03 ENCOUNTER — Inpatient Hospital Stay: Payer: 59 | Admitting: Oncology

## 2022-02-03 ENCOUNTER — Inpatient Hospital Stay: Payer: 59

## 2022-02-03 ENCOUNTER — Other Ambulatory Visit: Payer: Self-pay | Admitting: Oncology

## 2022-02-03 ENCOUNTER — Ambulatory Visit: Payer: 59

## 2022-02-03 ENCOUNTER — Other Ambulatory Visit: Payer: Self-pay

## 2022-02-03 ENCOUNTER — Inpatient Hospital Stay: Payer: 59 | Admitting: Radiology

## 2022-02-03 DIAGNOSIS — J9621 Acute and chronic respiratory failure with hypoxia: Secondary | ICD-10-CM | POA: Diagnosis not present

## 2022-02-03 HISTORY — PX: IR GUIDED DRAIN W CATHETER PLACEMENT: IMG719

## 2022-02-03 MED ORDER — LIDOCAINE HCL 1 % IJ SOLN
INTRAMUSCULAR | Status: AC
  Filled 2022-02-03: qty 20

## 2022-02-03 MED ORDER — FLUCONAZOLE 40 MG/ML PO SUSR
100.0000 mg | Freq: Every day | ORAL | 0 refills | Status: AC
  Filled 2022-02-03: qty 35, 14d supply, fill #0

## 2022-02-03 MED ORDER — ACETAMINOPHEN 500 MG PO TABS
1000.0000 mg | ORAL_TABLET | Freq: Four times a day (QID) | ORAL | 0 refills | Status: AC | PRN
  Filled 2022-02-03: qty 30, 4d supply, fill #0

## 2022-02-03 MED ORDER — MENTHOL 3 MG MT LOZG
1.0000 | LOZENGE | OROMUCOSAL | 12 refills | Status: AC | PRN
  Filled 2022-02-03: qty 50, fill #0

## 2022-02-03 MED ORDER — LIDOCAINE HCL (PF) 1 % IJ SOLN
15.0000 mL | Freq: Once | INTRAMUSCULAR | Status: AC
  Administered 2022-02-03: 15 mL

## 2022-02-03 MED ORDER — QUETIAPINE FUMARATE 25 MG PO TABS
12.5000 mg | ORAL_TABLET | Freq: Every evening | ORAL | 0 refills | Status: AC | PRN
  Filled 2022-02-03: qty 15, 30d supply, fill #0

## 2022-02-03 MED ORDER — MIDAZOLAM HCL 2 MG/2ML IJ SOLN
INTRAMUSCULAR | Status: DC | PRN
  Administered 2022-02-03: 2 mg via INTRAVENOUS

## 2022-02-03 MED ORDER — LORAZEPAM 0.5 MG PO TABS
0.5000 mg | ORAL_TABLET | Freq: Three times a day (TID) | ORAL | 0 refills | Status: AC | PRN
  Filled 2022-02-03: qty 20, 7d supply, fill #0

## 2022-02-03 MED ORDER — FENTANYL CITRATE (PF) 100 MCG/2ML IJ SOLN
INTRAMUSCULAR | Status: AC
  Filled 2022-02-03: qty 2

## 2022-02-03 MED ORDER — MIDAZOLAM HCL 2 MG/2ML IJ SOLN
INTRAMUSCULAR | Status: AC
  Filled 2022-02-03: qty 2

## 2022-02-03 MED ORDER — CEFAZOLIN SODIUM-DEXTROSE 2-4 GM/100ML-% IV SOLN
2.0000 g | Freq: Once | INTRAVENOUS | Status: AC
  Administered 2022-02-03: 2 g via INTRAVENOUS
  Filled 2022-02-03: qty 100

## 2022-02-03 MED ORDER — DOCUSATE SODIUM 100 MG PO CAPS
100.0000 mg | ORAL_CAPSULE | Freq: Two times a day (BID) | ORAL | 0 refills | Status: AC
  Filled 2022-02-03: qty 60, 30d supply, fill #0

## 2022-02-03 MED ORDER — AMOXICILLIN-POT CLAVULANATE 400-57 MG/5ML PO SUSR
800.0000 mg | Freq: Two times a day (BID) | ORAL | 0 refills | Status: AC
  Filled 2022-02-03: qty 100, 5d supply, fill #0

## 2022-02-03 MED ORDER — PANTOPRAZOLE SODIUM 40 MG PO TBEC
40.0000 mg | DELAYED_RELEASE_TABLET | Freq: Every day | ORAL | 0 refills | Status: AC
  Filled 2022-02-03: qty 30, 30d supply, fill #0

## 2022-02-03 MED ORDER — GUAIFENESIN-DM 100-10 MG/5ML PO SYRP
10.0000 mL | ORAL_SOLUTION | ORAL | 0 refills | Status: AC | PRN
  Filled 2022-02-03: qty 118, 2d supply, fill #0

## 2022-02-03 MED ORDER — MORPHINE SULFATE (CONCENTRATE) 10 MG /0.5 ML PO SOLN
5.0000 mg | ORAL | 0 refills | Status: AC | PRN
  Filled 2022-02-03: qty 90, 15d supply, fill #0
  Filled 2022-02-03: qty 30, 5d supply, fill #0

## 2022-02-03 MED ORDER — PREDNISONE 10 MG PO TABS
ORAL_TABLET | ORAL | 0 refills | Status: AC
  Filled 2022-02-03: qty 7, 6d supply, fill #0

## 2022-02-03 MED ORDER — ONDANSETRON 4 MG PO TBDP
4.0000 mg | ORAL_TABLET | Freq: Three times a day (TID) | ORAL | 0 refills | Status: AC | PRN
  Filled 2022-02-03: qty 20, 7d supply, fill #0

## 2022-02-03 NOTE — Progress Notes (Signed)
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 and/or use of iterative reconstruction technique. CONTRAST:  72m OMNIPAQUE IOHEXOL 300 MG/ML  SOLN COMPARISON:  CT angiogram chest 01/21/2022. CT chest abdomen and pelvis 11/27/2021. FINDINGS: Cardiovascular: No significant vascular findings. Normal heart size. No pericardial effusion. Left chest port is present with distal catheter tip ending in the SVC. Mediastinum/Nodes: Visualized thyroid gland and esophagus appear within normal limits. Enlarged precarinal lymph node is stable measuring up to 1 cm. Enlarged right hilar lymph nodes are present with the largest measuring 2.1 x 3.2 cm (unchanged). Enlarged left hilar lymph nodes measure up to 1.5 by 2.2 cm (unchanged) lower mediastinal/paraesophageal lymph nodes have increased in size for example image 2/82 measuring 2.2 x 3.3 cm (previously 1.6 x 2.6 cm). Lungs/Pleura: Moderate bilateral pleural effusions have increased from prior. Bilateral pulmonary nodules have  increased in size and number for example left upper lobe measuring 2.5 by 2.4 cm (previously 2.0 x 1.5 cm). There is new  airspace consolidation in the bilateral lower lobes. There is new confluent masslike opacity in the infrahilar region extending into the inferior right upper lobe. There is complete collapse of the right middle lobe which is new from prior. Upper Abdomen: There is a rounded hypodensity in the spleen measuring 9 mm, new from prior. Musculoskeletal: No chest wall abnormality. No acute or significant osseous findings. IMPRESSION: 1. Worsening pulmonary metastatic disease most significant in the right upper lobe. 2. Worsening mediastinal and hilar lymphadenopathy. 3. Increasing moderate bilateral pleural effusions. 4. New airspace disease in the bilateral lower lobes worrisome for pneumonia. 5. New complete collapse of the right middle lobe. 6. New 9 mm hypodensity in the spleen worrisome for metastatic disease. Electronically Signed   By: Ronney Asters M.D.   On: 01/27/2022 18:34   DG Chest Port 1 View  Result Date: 01/27/2022 CLINICAL DATA:  Sepsis.  Stage IV breast cancer. EXAM: PORTABLE CHEST 1 VIEW COMPARISON:  01/23/2022, chest CT 01/21/2022 FINDINGS: Left IJ Port-A-Cath unchanged. Interval worsening opacification over the mid to lower lungs bilaterally compatible with moderate size effusions with associated basilar atelectasis and known metastatic disease. Few small nodular opacities over the mid to upper lungs as seen on recent CT compatible with known metastatic disease. Remainder of the exam is unchanged. IMPRESSION: Interval worsening opacification over the mid to lower lungs bilaterally compatible with moderate size effusions with associated basilar atelectasis and known metastatic disease. Electronically Signed   By: Marin Olp M.D.   On: 01/27/2022 16:33   US THORACENTESIS ASP PLEURAL SPACE W/IMG GUIDE  Result Date: 01/23/2022 INDICATION: Patient with a history of breast  cancer and recurrent pleural effusions. Interventional radiology asked to perform a therapeutic thoracentesis. EXAM: ULTRASOUND GUIDED THORACENTESIS MEDICATIONS: 1% lidocaine 10 mL COMPLICATIONS: None immediate. PROCEDURE: An ultrasound guided thoracentesis was thoroughly discussed with the patient and questions answered. The benefits, risks, alternatives and complications were also discussed. The patient understands and wishes to proceed with the procedure. Written consent was obtained. Ultrasound was performed to localize and mark an adequate pocket of fluid in the left chest. The area was then prepped and draped in the normal sterile fashion. 1% Lidocaine was used for local anesthesia. Under ultrasound guidance a 6 Fr Safe-T-Centesis catheter was introduced. Thoracentesis was performed. The catheter was removed and a dressing applied. FINDINGS: A total of approximately 550 mL of clear yellow fluid was removed. IMPRESSION: Successful ultrasound guided left thoracentesis yielding 550 mL of pleural fluid. Read by: Soyla Dryer, NP Electronically Signed   By: Albin Felling M.D.   On: 01/23/2022 15:03   DG Chest Port 1 View  Result Date: 01/23/2022 CLINICAL DATA:  Status post thoracentesis. EXAM: PORTABLE CHEST 1 VIEW COMPARISON:  Radiographs 01/21/2022 and 01/12/2022.  CT 01/21/2022. FINDINGS: 1435 hours. The patient remains mildly rotated to the right. Left IJ central venous catheter appears unchanged at the level of the lower SVC. The heart size and mediastinal contours are stable. Left pleural effusion has decreased in volume following thoracentesis. No evidence of pneumothorax. Right pleural effusion and bibasilar airspace opacities have not significantly changed. The bones appear unremarkable. IMPRESSION: Decreased left pleural effusion following thoracentesis. No evidence of pneumothorax. No significant change in right pleural effusion and bibasilar airspace opacities. Electronically Signed   By:  Richardean Sale M.D.   On: 01/23/2022 14:57   US Venous Img Lower Bilateral (DVT)  Result Date: 01/22/2022 CLINICAL DATA:  Metastatic breast cancer, dyspnea, chest pain, elevated D-dimer EXAM: BILATERAL LOWER EXTREMITY VENOUS  DOPPLER ULTRASOUND TECHNIQUE: Gray-scale sonography with compression, as well as color and duplex ultrasound, were performed to evaluate the deep venous system(s) from the level of the common femoral vein through the popliteal and proximal calf veins. COMPARISON:  None Available. FINDINGS: VENOUS Normal compressibility of the common femoral, superficial femoral, and popliteal veins, as well as the visualized calf veins. Visualized portions of profunda femoral vein and great saphenous vein unremarkable. No filling defects to suggest DVT on grayscale or color Doppler imaging. Doppler waveforms show normal direction of venous flow, normal respiratory plasticity and response to augmentation. OTHER None. Limitations: none IMPRESSION: 1. Negative. Electronically Signed   By: Fidela Salisbury M.D.   On: 01/22/2022 10:00   CT Angio Chest PE W and/or Wo Contrast  Result Date: 01/21/2022 CLINICAL DATA:  Bilateral breast cancer, shortness of breath EXAM: CT ANGIOGRAPHY CHEST WITH CONTRAST TECHNIQUE: Multidetector CT imaging of the chest was performed using the standard protocol during bolus administration of intravenous contrast. Multiplanar CT image reconstructions and MIPs were obtained to evaluate the vascular anatomy. RADIATION DOSE REDUCTION: This exam was performed according to the departmental dose-optimization program which includes automated exposure control, adjustment of the mA and/or kV according to patient size and/or use of iterative reconstruction technique. CONTRAST:  75 mL OMNIPAQUE IOHEXOL 350 MG/ML SOLN COMPARISON:  11/27/2021 FINDINGS: Cardiovascular: Satisfactory opacification of the pulmonary arteries to the segmental level. No evidence of pulmonary embolism. Mild  cardiomegaly. Small pericardial effusion, about 9 mm in thickness. Heart size. No pericardial effusion. Mediastinum/Nodes: Extensive suspicious mediastinal, hilar, axillary and subdiaphragmatic adenopathy is seen which represents progression. The largest hilar node is on the right measuring 3.3 cm. Mediastinal adenopathy is confluent. Supraclavicular masses on the left up to 2.2 cm consistent with metastasis. There is a left axillary mass that measures 3.6 cm as well as numerous additional suspicious left axillary nodes. Lungs/Pleura: There is extensive bibasilar consolidation with moderate bilateral pleural effusions and numerous pulmonary nodules and masses which represents significant interval progression of disease. The largest discrete lung mass on the right in the lower lobe is 4.5 cm. Right upper lobe area of consolidation in the and mass is about 5 cm. There are innumerable additional nodules throughout both lungs are of varying sizes. Upper Abdomen: Imaged portions of the upper abdominal organs are unremarkable. Musculoskeletal: Thoracic degenerative changes noted. Left-sided Port-A-Cath tip is in the distal SVC. Review of the MIP images confirms the above findings. IMPRESSION: 1. No evidence of pulmonary embolism, aortic aneurysm or dissection. 2. Marked progression of disease with increase in size and number of lung nodules as well as extensive mediastinal, hilar, subdiaphragmatic, supraclavicular and left axillary adenopathy. 3. Bilateral pleural effusions. 4. Areas of alveolar pulmonary parenchymal consolidation could represent superimposed pneumonia. Electronically Signed   By: Sammie Bench M.D.   On: 01/21/2022 18:36   DG Chest 2 View  Result Date: 01/21/2022 CLINICAL DATA:  Shortness of breath. History of metastatic breast cancer. EXAM: CHEST - 2 VIEW COMPARISON:  Chest x-ray dated January 12, 2022. FINDINGS: Unchanged left chest wall port catheter. The heart size and mediastinal contours  are within normal limits. Increased small bilateral pleural effusions with worsening aeration at both lung bases. Multiple pulmonary nodules in both lungs are not significantly changed. No pneumothorax. No acute osseous abnormality. IMPRESSION: 1. Increased small bilateral pleural effusions with worsening bibasilar atelectasis and/or pneumonia. 2. Unchanged pulmonary metastatic disease. Electronically Signed   By: Titus Dubin M.D.   On: 01/21/2022 13:34   [place] CHEST (  PLEURAL EFFUSION)  Result Date: 01/14/2022 CLINICAL DATA:  61 year old female with recurrent pleural effusion EXAM: CHEST ULTRASOUND COMPARISON:  01/02/2022 FINDINGS: Ultrasound performed with potential thoracentesis. Ultrasound demonstrates small pleural effusion on the right and the left. Results were shared with the patient who deferred thoracentesis at this time. IMPRESSION: Small bilateral pleural effusions. Electronically Signed   By: Corrie Mckusick D.O.   On: 01/14/2022 16:26   DG Shoulder Left  Result Date: 01/14/2022 CLINICAL DATA:  61 year old female with sudden onset sharp pain radiating to the arm pit and chest. Metastatic breast cancer. EXAM: LEFT SHOULDER - 2+ VIEW COMPARISON:  Left humerus series today. Recent chest radiographs 01/12/2022. FINDINGS: No glenohumeral joint dislocation. Proximal left humerus appears intact. Bone mineralization is within normal limits for age. No acute osseous abnormality identified. Left chest power port and pleural effusion do not appear significantly changed from 01/12/2022. Visible left ribs appear grossly intact. IMPRESSION: 1. No osseous abnormality identified about the left shoulder. 2. Left pleural effusion not significantly changed from recent 01/12/2022 radiographs. Electronically Signed   By: Genevie Ann M.D.   On: 01/14/2022 10:29   DG Humerus Left  Result Date: 01/14/2022 CLINICAL DATA:  61 year old female with sudden onset sharp pain radiating to the arm pit and chest. EXAM: LEFT  HUMERUS - 2+ VIEW COMPARISON:  Left forearm series today. FINDINGS: Bone mineralization is within normal limits. Alignment appears grossly maintained at the left shoulder and elbow. There is no evidence of fracture or other focal bone lesions. Soft tissues are unremarkable. Partially visible left lung base veiling opacity suggesting pleural effusion. Left chest power port partially visible. IMPRESSION: 1. No osseous abnormality identified about the left humerus. 2. Partially visible left pleural effusion, Port-A-Cath. Electronically Signed   By: Genevie Ann M.D.   On: 01/14/2022 10:26   DG Forearm Left  Result Date: 01/14/2022 CLINICAL DATA:  61 year old female with sudden onset sharp pain radiating to the arm pit and chest. EXAM: LEFT FOREARM - 2 VIEW COMPARISON:  None Available. FINDINGS: Bone mineralization is within normal limits. There is no evidence of fracture or other focal bone lesions. No joint effusion is evident at the left elbow. Soft tissue contours appear within normal limits. IMPRESSION: Negative. Electronically Signed   By: Genevie Ann M.D.   On: 01/14/2022 10:25   DG Chest 2 View  Result Date: 01/12/2022 CLINICAL DATA:  Shortness of breath. History of pleural effusion. History of breast cancer. EXAM: CHEST - 2 VIEW COMPARISON:  Chest two views 01/02/2022 and 01/01/2022; CT chest abdomen and pelvis 11/27/2021 FINDINGS: Cardiac silhouette and mediastinal contours are within normal limits. Left chest wall porta catheter tip overlies the central superior vena cava. Mild-to-moderate left and trace right pleural effusions are not significantly changed. Approximately 2 cm left lateral midlung nodular density appears unchanged from 01/01/2022 frontal radiograph. The more accurate measurement on CT on 11/27/2021 was up to approximately 1.2 cm. Of the right mid to lower lung smaller nodules as better seen on prior CT. Posteroinferior opacity on lateral view corresponding to the dominant 4 cm  posteroinferior left lower lobe mass on prior CT. No pneumothorax.  Mild bilateral lower lung interstitial thickening. Moderate multilevel degenerative disc changes of the thoracic spine. IMPRESSION: 1. Mild-to-moderate left and trace right pleural effusions are not significantly changed from 01/02/2022. 2. Posteroinferior opacity on lateral view corresponding to the dominant 4 cm posteroinferior left lower lobe mass on prior CT. Additional bilateral pulmonary nodules again consistent with pulmonary metastases. Electronically Signed  By: Yvonne Kendall M.D.   On: 01/12/2022 16:37   MR Brain W Wo Contrast  Result Date: 01/07/2022 CLINICAL DATA:  Evaluate for metastatic disease. Breast cancer staging EXAM: MRI HEAD WITHOUT AND WITH CONTRAST TECHNIQUE: Multiplanar, multiecho pulse sequences of the brain and surrounding structures were obtained without and with intravenous contrast. CONTRAST:  73m GADAVIST GADOBUTROL 1 MMOL/ML IV SOLN COMPARISON:  06/18/2021 FINDINGS: Brain: No enhancement or swelling to suggest metastatic disease. Mild chronic small vessel ischemia in the cerebral white matter and pons. No acute or subacute infarct, hemorrhage, hydrocephalus, or collection Vascular: Normal flow voids and vascular enhancements Skull and upper cervical spine: Posterior scalp nodule closely associated with the right para median calvarium, see 16:113, not seen on priors but also not hypermetabolic on recent PET CT. The nodule measures 10 x 3 mm, attention on follow-up. Sinuses/Orbits: Negative IMPRESSION: 1. Negative for metastatic disease to the brain. 2. New, small posterior scalp nodule which would be concerning for a metastatic deposit but was not hypermetabolic on recent PET, question recent trauma. Electronically Signed   By: JJorje GuildM.D.   On: 01/07/2022 12:38     Assessment and plan- Patient is a 61y.o. 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  She has undergone thoracentesis multiple times but her fluid does reaccumulate fairly quickly.  Earlier this morning patient was on 12 L of oxygen but presently she is on 5 L and saturating around 93%.  I had a long conversation with patient and her family.  They understand that her underlying cause of hypoxia is her aggressive nature of metastatic triple negative breast cancer with extensive liver and lymph node metastases as well as malignant pleural effusion.  She is supposed to be getting [employer] administrator  Explained to the patient that even if she gets TIvette Loyalit is unlikely that 1 treatment will make her breathing significantly better.  Will probably have to wait for at least 6 weeks to see if this treatment is working.  Response rate is about 35%.  Treatment is associated with significant side effects such as nausea, vomiting, low blood counts, risk of infections and infusion reactions.  My concern is that patient is considerably frail and her respiratory status is tenuous.  If we do plan on doing chemotherapy tomorrow it would involve frequent hospital as well as outpatient visits.  It is possible the patient may die of complications from treatment itself.  Treatment even if attempted would be palliative and not curative.  Patient understands pros and cons of treatment and has ultimately decided not to pursue any aggressive treatment at this time and proceed with home hospice.  She is a DNR.  I will have NP JVonna KotykBorders coordinate her discharge to home hospice tomorrow.  We did discuss about reaccumulating pleural effusion and pros and cons of Pleurx catheter.  PleurX catheter can be associated with local discomfort as well as pain and increased risk of infection.  As of now we have decided to hold off on any Pleurx catheter placement.  Focus would be her quality of  life and had dyspnea which can be managed with pain medications.  We will not be focusing on her oxygen saturations when she goes home with hospice.  Patient is agreeable with this plan and family expressed understanding as well.  Visit Diagnosis 1. Dyspnea, unspecified type   2. Hypoxia   3. Hospital-acquired pneumonia   4. Status post thoracentesis   5. Pleural effusion on left   6. Numbness and tingling of right face      Dr. Randa Evens, MD, MPH [facility] [phone] 02/03/2022 8:40 AM              \

## 2022-02-03 NOTE — Progress Notes (Signed)
PT Cancellation Note  Patient Details Name: Alonna Bartling MRN: [id number] DOB: 06/05/1960   Cancelled Treatment:    Reason Eval/Treat Not Completed: Medical issues which prohibited therapy: Pt's nurse requested PT be held this date secondary to elevated HR with exertion.  Will attempt to see pt at a future date/time as medically appropriate.     Heloise Beecham Shamus Desantis PT, DPT 02/03/22, 1:12 PM

## 2022-02-03 NOTE — TOC Transition Note (Signed)
Transition of Care [facility]) - CM/SW Discharge Note   Patient Details  Name: Amanda Skinner MRN: [id number] Date of Birth: March 08, 1960  Transition of Care [facility]) CM/SW Contact:  Laurena Slimmer, RN Phone Number: 02/03/2022, 4:40 PM   Clinical Narrative:    Spoke with patient's spouse. He will transport her home.   TOC signing off.          Patient Goals and CMS Choice        Discharge Placement                       Discharge Plan and Services                                     Social Determinants of Health (SDOH) Interventions     Readmission Risk Interventions     No data to display

## 2022-02-03 NOTE — Consult Note (Signed)
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 port is present with distal catheter tip ending in the SVC. Mediastinum/Nodes: Visualized thyroid gland and esophagus appear within normal limits. Enlarged precarinal lymph node is stable measuring up to 1 cm. Enlarged right hilar lymph nodes are present with the largest measuring 2.1 x 3.2 cm (unchanged). Enlarged left hilar lymph nodes measure up to 1.5 by 2.2 cm (unchanged) lower mediastinal/paraesophageal lymph nodes have increased in size for example image 2/82 measuring 2.2 x 3.3 cm (previously 1.6 x 2.6 cm). Lungs/Pleura: Moderate bilateral pleural effusions have increased from prior. Bilateral pulmonary nodules have increased in size and number for example left upper lobe measuring 2.5 by 2.4 cm (previously 2.0 x 1.5 cm). There is new airspace consolidation in the bilateral lower lobes. There is new confluent masslike opacity in the infrahilar region extending into the inferior right upper lobe. There is complete collapse  of the right middle lobe which is new from prior. Upper Abdomen: There is a rounded hypodensity in the spleen measuring 9 mm, new from prior. Musculoskeletal: No chest wall abnormality. No acute or significant osseous findings. IMPRESSION: 1. Worsening pulmonary metastatic disease most significant in the right upper lobe. 2. Worsening mediastinal and hilar lymphadenopathy. 3. Increasing moderate bilateral pleural effusions. 4. New airspace disease in the bilateral lower lobes worrisome for pneumonia. 5. New  complete collapse of the right middle lobe. 6. New 9 mm hypodensity in the spleen worrisome for metastatic disease. Electronically Signed   By: Ronney Asters M.D.   On: 01/27/2022 18:34   DG Chest Port 1 View  Result Date: 01/27/2022 CLINICAL DATA:  Sepsis.  Stage IV breast cancer. EXAM: PORTABLE CHEST 1 VIEW COMPARISON:  01/23/2022, chest CT 01/21/2022 FINDINGS: Left IJ Port-A-Cath unchanged. Interval worsening opacification over the mid to lower lungs bilaterally compatible with moderate size effusions with associated basilar atelectasis and known metastatic disease. Few small nodular opacities over the mid to upper lungs as seen on recent CT compatible with known metastatic disease. Remainder of the exam is unchanged. IMPRESSION: Interval worsening opacification over the mid to lower lungs bilaterally compatible with moderate size effusions with associated basilar atelectasis and known metastatic disease. Electronically Signed   By: Marin Olp M.D.   On: 01/27/2022 16:33   US THORACENTESIS ASP PLEURAL SPACE W/IMG GUIDE  Result Date: 01/23/2022 INDICATION: Patient with a history of breast cancer and recurrent pleural effusions. Interventional radiology asked to perform a therapeutic thoracentesis. EXAM: ULTRASOUND GUIDED THORACENTESIS MEDICATIONS: 1% lidocaine 10 mL COMPLICATIONS: None immediate. PROCEDURE: An ultrasound guided thoracentesis was thoroughly discussed with the patient and questions  answered. The benefits, risks, alternatives and complications were also discussed. The patient understands and wishes to proceed with the procedure. Written consent was obtained. Ultrasound was performed to localize and mark an adequate pocket of fluid in the left chest. The area was then prepped and draped in the normal sterile fashion. 1% Lidocaine was used for local anesthesia. Under ultrasound guidance a 6 Fr Safe-T-Centesis catheter was introduced. Thoracentesis was performed. The catheter was removed and a dressing applied. FINDINGS: A total of approximately 550 mL of clear yellow fluid was removed. IMPRESSION: Successful ultrasound guided left thoracentesis yielding 550 mL of pleural fluid. Read by: Soyla Dryer, NP Electronically Signed   By: Albin Felling M.D.   On: 01/23/2022 15:03   DG Chest Port 1 View  Result Date: 01/23/2022 CLINICAL DATA:  Status post thoracentesis. EXAM: PORTABLE CHEST 1 VIEW COMPARISON:  Radiographs 01/21/2022 and 01/12/2022.  CT 01/21/2022. FINDINGS: 1435 hours. The patient remains mildly rotated to the right. Left IJ central venous catheter appears unchanged at the level of the lower SVC. The heart size and mediastinal contours are stable. Left pleural effusion has decreased in volume following thoracentesis. No evidence of pneumothorax. Right pleural effusion and bibasilar airspace opacities have not significantly changed. The bones appear unremarkable. IMPRESSION: Decreased left pleural effusion following thoracentesis. No evidence of pneumothorax. No significant change in right pleural effusion and bibasilar airspace opacities. Electronically Signed   By: Richardean Sale M.D.   On: 01/23/2022 14:57   US Venous Img Lower Bilateral (DVT)  Result Date: 01/22/2022 CLINICAL DATA:  Metastatic breast cancer, dyspnea, chest pain, elevated D-dimer EXAM: BILATERAL LOWER EXTREMITY VENOUS DOPPLER ULTRASOUND TECHNIQUE: Gray-scale sonography with compression, as well as color  and duplex ultrasound, were performed to evaluate the deep venous system(s) from the level of the common femoral vein through the popliteal and proximal calf veins. COMPARISON:  None Available. FINDINGS: VENOUS Normal compressibility of the common femoral, superficial femoral, and popliteal veins, as well as the visualized calf veins. Visualized portions of profunda femoral vein and great saphenous vein unremarkable. No filling defects to suggest DVT on grayscale or color Doppler imaging. Doppler waveforms show normal direction of venous flow, normal respiratory plasticity and response to augmentation. OTHER None. Limitations: none IMPRESSION:  1. Negative. Electronically Signed   By: Fidela Salisbury M.D.   On: 01/22/2022 10:00   CT Angio Chest PE W and/or Wo Contrast  Result Date: 01/21/2022 CLINICAL DATA:  Bilateral breast cancer, shortness of breath EXAM: CT ANGIOGRAPHY CHEST WITH CONTRAST TECHNIQUE: Multidetector CT imaging of the chest was performed using the standard protocol during bolus administration of intravenous contrast. Multiplanar CT image reconstructions and MIPs were obtained to evaluate the vascular anatomy. RADIATION DOSE REDUCTION: This exam was performed according to the departmental dose-optimization program which includes automated exposure control, adjustment of the mA and/or kV according to patient size and/or use of iterative reconstruction technique. CONTRAST:  75 mL OMNIPAQUE IOHEXOL 350 MG/ML SOLN COMPARISON:  11/27/2021 FINDINGS: Cardiovascular: Satisfactory opacification of the pulmonary arteries to the segmental level. No evidence of pulmonary embolism. Mild cardiomegaly. Small pericardial effusion, about 9 mm in thickness. Heart size. No pericardial effusion. Mediastinum/Nodes: Extensive suspicious mediastinal, hilar, axillary and subdiaphragmatic adenopathy is seen which represents progression. The largest hilar node is on the right measuring 3.3 cm. Mediastinal adenopathy is  confluent. Supraclavicular masses on the left up to 2.2 cm consistent with metastasis. There is a left axillary mass that measures 3.6 cm as well as numerous additional suspicious left axillary nodes. Lungs/Pleura: There is extensive bibasilar consolidation with moderate bilateral pleural effusions and numerous pulmonary nodules and masses which represents significant interval progression of disease. The largest discrete lung mass on the right in the lower lobe is 4.5 cm. Right upper lobe area of consolidation in the and mass is about 5 cm. There are innumerable additional nodules throughout both lungs are of varying sizes. Upper Abdomen: Imaged portions of the upper abdominal organs are unremarkable. Musculoskeletal: Thoracic degenerative changes noted. Left-sided Port-A-Cath tip is in the distal SVC. Review of the MIP images confirms the above findings. IMPRESSION: 1. No evidence of pulmonary embolism, aortic aneurysm or dissection. 2. Marked progression of disease with increase in size and number of lung nodules as well as extensive mediastinal, hilar, subdiaphragmatic, supraclavicular and left axillary adenopathy. 3. Bilateral pleural effusions. 4. Areas of alveolar pulmonary parenchymal consolidation could represent superimposed pneumonia. Electronically Signed   By: Sammie Bench M.D.   On: 01/21/2022 18:36   DG Chest 2 View  Result Date: 01/21/2022 CLINICAL DATA:  Shortness of breath. History of metastatic breast cancer. EXAM: CHEST - 2 VIEW COMPARISON:  Chest x-ray dated January 12, 2022. FINDINGS: Unchanged left chest wall port catheter. The heart size and mediastinal contours are within normal limits. Increased small bilateral pleural effusions with worsening aeration at both lung bases. Multiple pulmonary nodules in both lungs are not significantly changed. No pneumothorax. No acute osseous abnormality. IMPRESSION: 1. Increased small bilateral pleural effusions with worsening bibasilar  atelectasis and/or pneumonia. 2. Unchanged pulmonary metastatic disease. Electronically Signed   By: Titus Dubin M.D.   On: 01/21/2022 13:34   [place] CHEST (PLEURAL EFFUSION)  Result Date: 01/14/2022 CLINICAL DATA:  61 year old female with recurrent pleural effusion EXAM: CHEST ULTRASOUND COMPARISON:  01/02/2022 FINDINGS: Ultrasound performed with potential thoracentesis. Ultrasound demonstrates small pleural effusion on the right and the left. Results were shared with the patient who deferred thoracentesis at this time. IMPRESSION: Small bilateral pleural effusions. Electronically Signed   By: Corrie Mckusick D.O.   On: 01/14/2022 16:26   DG Shoulder Left  Result Date: 01/14/2022 CLINICAL DATA:  61 year old female with sudden onset sharp pain radiating to the arm pit and chest. Metastatic breast cancer. EXAM: LEFT SHOULDER - 2+ VIEW  COMPARISON:  Left humerus series today. Recent chest radiographs 01/12/2022. FINDINGS: No glenohumeral joint dislocation. Proximal left humerus appears intact. Bone mineralization is within normal limits for age. No acute osseous abnormality identified. Left chest power port and pleural effusion do not appear significantly changed from 01/12/2022. Visible left ribs appear grossly intact. IMPRESSION: 1. No osseous abnormality identified about the left shoulder. 2. Left pleural effusion not significantly changed from recent 01/12/2022 radiographs. Electronically Signed   By: Genevie Ann M.D.   On: 01/14/2022 10:29   DG Humerus Left  Result Date: 01/14/2022 CLINICAL DATA:  61 year old female with sudden onset sharp pain radiating to the arm pit and chest. EXAM: LEFT HUMERUS - 2+ VIEW COMPARISON:  Left forearm series today. FINDINGS: Bone mineralization is within normal limits. Alignment appears grossly maintained at the left shoulder and elbow. There is no evidence of fracture or other focal bone lesions. Soft tissues are unremarkable. Partially visible left lung base veiling  opacity suggesting pleural effusion. Left chest power port partially visible. IMPRESSION: 1. No osseous abnormality identified about the left humerus. 2. Partially visible left pleural effusion, Port-A-Cath. Electronically Signed   By: Genevie Ann M.D.   On: 01/14/2022 10:26   DG Forearm Left  Result Date: 01/14/2022 CLINICAL DATA:  61 year old female with sudden onset sharp pain radiating to the arm pit and chest. EXAM: LEFT FOREARM - 2 VIEW COMPARISON:  None Available. FINDINGS: Bone mineralization is within normal limits. There is no evidence of fracture or other focal bone lesions. No joint effusion is evident at the left elbow. Soft tissue contours appear within normal limits. IMPRESSION: Negative. Electronically Signed   By: Genevie Ann M.D.   On: 01/14/2022 10:25   DG Chest 2 View  Result Date: 01/12/2022 CLINICAL DATA:  Shortness of breath. History of pleural effusion. History of breast cancer. EXAM: CHEST - 2 VIEW COMPARISON:  Chest two views 01/02/2022 and 01/01/2022; CT chest abdomen and pelvis 11/27/2021 FINDINGS: Cardiac silhouette and mediastinal contours are within normal limits. Left chest wall porta catheter tip overlies the central superior vena cava. Mild-to-moderate left and trace right pleural effusions are not significantly changed. Approximately 2 cm left lateral midlung nodular density appears unchanged from 01/01/2022 frontal radiograph. The more accurate measurement on CT on 11/27/2021 was up to approximately 1.2 cm. Of the right mid to lower lung smaller nodules as better seen on prior CT. Posteroinferior opacity on lateral view corresponding to the dominant 4 cm posteroinferior left lower lobe mass on prior CT. No pneumothorax.  Mild bilateral lower lung interstitial thickening. Moderate multilevel degenerative disc changes of the thoracic spine. IMPRESSION: 1. Mild-to-moderate left and trace right pleural effusions are not significantly changed from 01/02/2022. 2. Posteroinferior  opacity on lateral view corresponding to the dominant 4 cm posteroinferior left lower lobe mass on prior CT. Additional bilateral pulmonary nodules again consistent with pulmonary metastases. Electronically Signed   By: Yvonne Kendall M.D.   On: 01/12/2022 16:37   MR Brain W Wo Contrast  Result Date: 01/07/2022 CLINICAL DATA:  Evaluate for metastatic disease. Breast cancer staging EXAM: MRI HEAD WITHOUT AND WITH CONTRAST TECHNIQUE: Multiplanar, multiecho pulse sequences of the brain and surrounding structures were obtained without and with intravenous contrast. CONTRAST:  96m GADAVIST GADOBUTROL 1 MMOL/ML IV SOLN COMPARISON:  06/18/2021 FINDINGS: Brain: No enhancement or swelling to suggest metastatic disease. Mild chronic small vessel ischemia in the cerebral white matter and pons. No acute or subacute infarct, hemorrhage, hydrocephalus, or collection Vascular: Normal flow voids  and vascular enhancements Skull and upper cervical spine: Posterior scalp nodule closely associated with the right para median calvarium, see 16:113, not seen on priors but also not hypermetabolic on recent PET CT. The nodule measures 10 x 3 mm, attention on follow-up. Sinuses/Orbits: Negative IMPRESSION: 1. Negative for metastatic disease to the brain. 2. New, small posterior scalp nodule which would be concerning for a metastatic deposit but was not hypermetabolic on recent PET, question recent trauma. Electronically Signed   By: Jorje Guild M.D.   On: 01/07/2022 12:38    Labs:  CBC: Recent Labs    01/29/22 0525 01/31/22 0500 02/01/22 0600 02/02/22 0510  WBC 9.8 9.4 9.3 14.3*  HGB 9.7* 9.3* 8.7* 9.9*  HCT 28.2* 27.2* 26.3* 30.6*  PLT 218 255 271 329    COAGS: Recent Labs    01/27/22 1735 01/28/22 0450  INR 1.1 1.1    BMP: Recent Labs    01/30/22 0530 01/31/22 0500 02/01/22 0600 02/02/22 0510  NA 131* 132* 138 141  K 3.0* 3.0* 3.5 3.5  CL 92* 90* 97* 100  CO2 34* 34* 37* 33*  GLUCOSE 122* 128*  144* 134*  BUN 6* 5* 11 11  CALCIUM 8.9 8.6* 9.2 10.0  CREATININE 0.54 0.53 0.59 0.54  GFRNONAA >60 >60 >60 >60    LIVER FUNCTION TESTS: Recent Labs    01/21/22 1534 01/27/22 1342 01/27/22 1735 01/31/22 0500  BILITOT 0.7 0.6 0.9 0.6  AST _0 ALT _1 ALKPHOS 90 78 84 80  PROT 6.3* 5.9* 6.0* 5.3*  ALBUMIN 2.9* 2.5* 2.5* 2.0*      Assessment and Plan:  61 y.o. female inpatient. Known to IR. History of metastatic triple negative breast cancer. Admitted for fever and chronic respiratory failure.  Found to have a pneumonia.  Team is request pluerX catheter placement for recurrent pleural effusion. Patient to be discharged home on home hospice  White blood cell count from February 02, 2022 is 14.3, total protein 5.3, albumin 2.0.  Patient is on prophylactic Lovenox last dose given in February 02, 2022 at 11:05 PM  Risks and benefits of thoracic pleurX Catheter placement discussed with the patient including bleeding, infection, damage to adjacent structures, bowel perforation/fistula connection, and sepsis.  All of the patient's questions were answered, patient is agreeable to proceed. Consent signed and in chart.   Thank you for this interesting consult.  I greatly enjoyed meeting Tennile Anndee Connett and look forward to participating in their care.  A copy of this report was sent to the requesting provider on this date.  Electronically Signed: Jacqualine Mau, NP 02/03/2022, 10:54 AM   I spent a total of 40 Minutes    in face to face in clinical consultation, greater than 50% of which was counseling/coordinating care for thoracic pleurX catheter placement

## 2022-02-03 NOTE — Procedures (Signed)
Vascular and Interventional Radiology Procedure Note  Patient: Amanda Skinner DOB: 02-Jun-1960 Medical Record Number: [id number] Note Date/Time: 02/03/22 5:02 PM   Performing Physician: Michaelle Birks, MD Assistant(s): None  Diagnosis: Hospice / Palliative. Pleural effusion  Procedure:  TUNNELED LEFT CHEST (Ple[facility]Rx ) DRAINAGE CATHETER  PLACEMENT THERAPEUTIC THORACENTESIS  Anesthesia: Local Anesthetic Complications: None Estimated Blood Loss: Minimal Specimens:  Sent None  Findings:  Tunneled LEFT CHEST drainage catheter placement Therapeutic thoracentesis with 500 mL of fluid was obtained.   Plan: Intermittent drainage schedule per Hospice and Palliative Teams.  See detailed procedure note with images in PACS. The patient tolerated the procedure well without incident or complication and was returned to Floor Bed in stable condition.    Michaelle Birks, MD [facility]   Pager. [place]

## 2022-02-03 NOTE — Discharge Summary (Signed)
Physician Discharge Summary   Patient: Amanda Skinner MRN: [id number] DOB: 1961-02-23  Admit date:     01/27/2022  Discharge date: 02/03/22  Discharge Physician: Amanda Skinner   PCP: Amanda Haven, MD   Recommendations at discharge:   Follow-up with home hospice Can contact Dr. Janese Skinner or Amanda Skinner if any questions Can remove fluid underneath left lung every 3 days with hospice Can drain fluid from left Pleurx catheter with hospice nurse every 3 days if needed.  Would drain at least once a week.  Discharge Diagnoses: Principal Problem:   Acute on chronic respiratory failure with hypoxia (HCC) Active Problems:   Obstructive pneumonia   Primary cancer of right breast with metastasis to other site [facility])   Malignant pleural effusion   Pleural effusion on left   Numbness and tingling of right face   Malignant neoplasm of upper-outer quadrant of right breast in female, estrogen receptor positive (HCC)   Mild intermittent asthma   Nausea   Hypokalemia   Hyponatremia   Left shoulder pain   Thrush   Hypomagnesemia   Hypophosphatemia   Left leg pain   Pain   Anemia, chronic disease    Hospital Course: 61 year old female with past medical history of metastatic breast cancer to bone, lungs, lung pleura and lymph nodes.  She was recently started on oxygen as outpatient prior to that admission.  She was recently in the hospital from 11/29 to 12/3 and treated for pneumonia.  Patient readmitted on 01/27/2022 with pneumonia and pleural effusions.  Thoracentesis on 01/28/2022 remove 950 mL underneath the right lung.  Thoracentesis on 12/7 removed 800 mL underneath the left lung.  The patient was having fevers at night but temperature curve coming down.  Antibiotics changed from Zosyn over to Unasyn.  Patient had left shoulder pain on 01/31/2022.  Patient will have a steroid injection in the shoulder.  Started on systemic steroids on 01/31/2022 for wheezing.  02/02/2022.  MRI last night  showing of brain metastases.  Patient and family wanted to speak with palliative care and oncology to discuss options.  If they decide no further treatments then would be a candidate for hospice. 02/03/2022.  Family decided on home with hospice.  They have decided on Pleurx catheter placement.  IR placed a left Pleurx catheter and also removed 500 mL of fluid.  As per transitional care team, the hospice nurse can drain from the Pleurx catheter.  Recommend draining every 3 or 4 days or more often every other day if worsening shortness of breath.  Case discussed with Amanda Skinner palliative care and they do have somebody in the cancer center that can drain from the Pleurx catheter if the hospice nurses unsuccessful.  I advised the patient and family that if they wanted a right Pleurx catheter that Amanda can set this up as outpatient.  Patient switched over to Augmentin for discharge.  Patient will also go home on prednisone taper and Diflucan for thrush.  Assessment and Plan: * Acute on chronic respiratory failure with hypoxia (HCC) O2 sat 88% on home flow rate of 3.5.  Patient was on 7 L high flow nasal cannula for most of the hospital stay then had to go up to 12 L.  Yesterday morning was on 9 L.  After thoracentesis was able to go down to 5 L regular nasal cannula..  Patient elects to be DNR/DNI (okay to do pressors and BiPAP if needed).  Need to set up two oxygen concentrator's at  home.  Started Solu-Medrol on 01/31/2022.  Prednisone taper prescribed.  Currently on 5 L nasal cannula.  Family has pulse ox at home and can adjust oxygen as needed.    Obstructive pneumonia Patient recently received Rocephin and Zithromax.  Patient was started on vancomycin and meropenem initially.  Since MRSA PCR is negative vancomycin was discontinued.  Zosyn switched over to Unasyn on 01/30/2022.  Switch to Augmentin on 02/02/2022 and prescribe on discharge.  Primary cancer of right breast with metastasis to other site  [facility]) New brain metastases seen on MRI brain on 02/01/2022.  Patient also has metastases to the bone, lung, lung pleura.  Patient and family have decided on hospice management and no further treatments.  I advised that Roxanol can be given for air hunger, pain and or shortness of breath.  This can be given every hour and can be absorbed sublingually if she is unable to swallow.  This medication is as needed.  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  Patient had 550 mL of fluid removed underneath the left lung on 01/23/2022.  800 mL removed from underneath the left lung on 01/29/2022.  Thoracentesis on 02/02/2022 drew off another 650 mL underneath the right lung.  02/03/2022 Pleurx catheter placed by interventional radiology and 500 mL removed.  Hospice nurse can remove further fluid.  Numbness and tingling of right face I do not think the MRI of the brain results would explain this.  Malignant neoplasm of upper-outer quadrant of right breast in female, estrogen receptor positive (HCC)    Anemia, chronic disease Hemoglobin up to 9.9.  Continue to monitor.  Pain Switch Solu-Medrol over to prednisone and continue taper.  Continue Roxanol.  Left leg pain Left leg sonogram negative for DVT  Hypophosphatemia Replaced.  Hypomagnesemia Replaced into the normal range  Thrush Improving on the tongue.  Continue Diflucan  Left shoulder pain Patient able to move left arm around better after steroid injection.  Hyponatremia Sodium in the normal range  Hypokalemia Improved but will still give oral supplementation.  Nausea As needed nausea medication.  Mild intermittent asthma Albuterol as needed         Consultants: Oncology, palliative care, interventional radiology Procedures performed: 3 thoracentesis and a Pleurx catheter Disposition: Home with hospice Diet recommendation:  As tolerated regular diet DISCHARGE  MEDICATION: Allergies as of 02/03/2022       Reactions   Etodolac Other (See Comments)   Reaction:  Migraines         Medication List     STOP taking these medications    cefdinir 250 MG/5ML suspension Commonly known as: OMNICEF   doxycycline 100 MG capsule Commonly known as: MONODOX   estradiol 0.1 MG/GM vaginal cream Commonly known as: ESTRACE   lidocaine 2 % solution Commonly known as: XYLOCAINE   lidocaine-prilocaine cream Commonly known as: EMLA   magic mouthwash (multi-ingredient) oral suspension   mirtazapine 7.5 MG tablet Commonly known as: REMERON   pseudoephedrine 120 MG 12 hr tablet Commonly known as: SUDAFED       TAKE these medications    acetaminophen 500 MG tablet Commonly known as: TYLENOL Take 2 tablets (1,000 mg total) by mouth every 6 (six) hours as needed. What changed: when to take this   albuterol 108 (90 Base) MCG/ACT inhaler Commonly known as: VENTOLIN HFA INHALE 2 PUFFS INTO THE LUNGS EVERY 6 HOURS AS NEEDED FOR WHEEZING OR SHORTNESS OF BREATH.   amoxicillin-clavulanate 400-57  MG/5ML suspension Commonly known as: AUGMENTIN Take 10 mLs (800 mg total) by mouth every 12 (twelve) hours for 5 days.   benzonatate 100 MG capsule Commonly known as: TESSALON Take 1 capsule (100 mg total) by mouth 4 (four) times daily.   docusate sodium 100 MG capsule Commonly known as: COLACE Take 1 capsule (100 mg total) by mouth 2 (two) times daily.   fluconazole 40 MG/ML suspension Commonly known as: DIFLUCAN Take 2.5 mLs (100 mg total) by mouth daily for 14 days.   fluticasone 50 MCG/ACT nasal spray Commonly known as: FLONASE Place 2 sprays into both nostrils daily.   gabapentin 100 MG capsule Commonly known as: NEURONTIN Take 1 capsule by mouth three times daily   guaiFENesin-dextromethorphan 100-10 MG/5ML syrup Commonly known as: ROBITUSSIN DM Take 10 mLs by mouth every 4 (four) hours as needed for cough.   ibuprofen 200 MG  tablet Commonly known as: ADVIL Take 400 mg by mouth every 6 (six) hours as needed.   levalbuterol 0.63 MG/3ML nebulizer solution Commonly known as: XOPENEX Take 3 mLs (0.63 mg total) by nebulization every 6 (six) hours as needed for wheezing or shortness of breath.   loratadine 5 MG chewable tablet Commonly known as: CLARITIN Chew 5 mg by mouth daily.   LORazepam 0.5 MG tablet Commonly known as: ATIVAN Take 1 tablet (0.5 mg total) by mouth every 8 (eight) hours as needed for anxiety.   menthol-cetylpyridinium 3 MG lozenge Commonly known as: CEPACOL Take 1 lozenge (3 mg total) by mouth as needed for sore throat.   morphine CONCENTRATE 10 mg / 0.5 ml concentrated solution Take 0.25 mLs (5 mg total) by mouth every hour as needed for severe pain or shortness of breath. What changed:  how much to take when to take this   ondansetron 4 MG disintegrating tablet Commonly known as: ZOFRAN-ODT Dissolve 1 tablet (4 mg total) on the tongue every 8 (eight) hours as needed. (Take 1 tablet (4 mg total) by mouth every 8 (eight) hours as needed.)   pantoprazole 40 MG tablet Commonly known as: Protonix Take 1 tablet (40 mg total) by mouth at bedtime.   predniSONE 10 MG tablet Commonly known as: DELTASONE Take 2 tablets by mouth on day1 and 2, then 1 tablet daily on days 3 and 4, then 1/2 tablet daily on days 5 and 6 (2 tabs po day1,2; 1 tab po day3,4; 1/2 tab po day5,6)   QUEtiapine 25 MG tablet Commonly known as: SEROQUEL Take 0.5 tablets (12.5 mg total) by mouth at bedtime as needed (sleep/hallucinations).   Serevent Diskus 50 MCG/ACT diskus inhaler Generic drug: salmeterol Inhale 1 puff into the lungs 2 (two) times daily.   sodium chloride 0.65 % Soln nasal spray Commonly known as: OCEAN Place 2 sprays into both nostrils every 2 (two) hours as needed for congestion.   Strata xrt Gel apply TO AFFECTED AREA THREE TIMES DAILY AS DIRECTED   sucralfate 1 GM/10ML  suspension Commonly known as: Carafate Take 10 mLs (1 g total) by mouth 4 (four) times daily -  with meals and at bedtime.               Durable Medical Equipment  (From admission, onward)           Start     Ordered   01/30/22 0955  For home use only DME oxygen  Once       Comments: Will need 2 concentrators at home  Question Answer Comment  Length of Need Lifetime   Mode or (Route) Nasal cannula   Liters per Minute 7   Frequency Continuous (stationary and portable oxygen unit needed)   Oxygen conserving device Yes   Oxygen delivery system Gas      01/30/22 0954   01/29/22 1407  For home use only DME Hospital bed  Once       Question Answer Comment  Length of Need Lifetime   Patient has (list medical condition): metastatic breast cancer   The above medical condition requires: Patient requires the ability to reposition frequently   Head must be elevated greater than: 30 degrees   Bed type Semi-electric   Trapeze Bar Yes   Support Surface: Gel Overlay      01/29/22 1406            Discharge Exam: Filed Weights   01/27/22 1704 01/27/22 2115 01/28/22 0909  Weight: 60 kg 27.2 kg 64.7 kg   Physical Exam HENT:     Head: Normocephalic.     Mouth/Throat:     Pharynx: No oropharyngeal exudate.     Comments: Thrush on tongue Eyes:     General: Lids are normal.     Conjunctiva/sclera: Conjunctivae normal.  Cardiovascular:     Rate and Rhythm: Regular rhythm. Tachycardia present.     Heart sounds: Normal heart sounds, S1 normal and S2 normal.  Pulmonary:     Breath sounds: Examination of the right-lower field reveals decreased breath sounds and rhonchi. Examination of the left-lower field reveals decreased breath sounds and rhonchi. Decreased breath sounds and rhonchi present. No wheezing or rales.  Abdominal:     Palpations: Abdomen is soft.     Tenderness: There is no abdominal tenderness.  Musculoskeletal:     Right lower leg: No swelling.     Left  lower leg: No swelling.  Skin:    General: Skin is warm.     Findings: No rash.  Neurological:     Mental Status: She is alert and oriented to person, place, and time.      Condition at discharge: fair  The results of significant diagnostics from this hospitalization (including imaging, microbiology, ancillary and laboratory) are listed below for reference.   Imaging Studies: DG Chest Port 1 View  Result Date: 02/03/2022 CLINICAL DATA:  Fever, short of breath, metastatic breast cancer EXAM: PORTABLE CHEST 1 VIEW COMPARISON:  02/02/2022 FINDINGS: Single frontal view of the chest demonstrates interval placement of a left-sided chest tube, tip at the left apex. Stable left chest wall port. Complete resolution of the loculated left pleural effusion seen previously. Bilateral pulmonary metastases are again identified, with superimposed airspace disease that has developed since prior study right greater than left. Stable right pleural effusion. No pneumothorax. IMPRESSION: 1. Complete resolution of left pleural effusion after chest tube placement. 2. Progressive bilateral airspace disease superimposed upon diffuse pulmonary metastases. Findings could reflect progressive pneumonia or edema. Electronically Signed   By: Randa Ngo M.D.   On: 02/03/2022 17:45   IR Guided Niel Hummer W Catheter Placement  Result Date: 02/03/2022 CLINICAL DATA:  Recurrent pleural effusion. History of metastatic breast cancer. Hospice/palliative. EXAM: TUNNELED LEFT SIDED PLEURAL DRAINAGE "PleurX" CATHETER COMPARISON:  Chest XR, 02/02/2022.  CT chest, 01/27/2022. MEDICATIONS: Ancef 2 gm IV; Antibiotic was administered in an appropriate time interval for the procedure. ANESTHESIA/SEDATION: Local anesthetic and single agent sedation was employed during this procedure. A total of Versed 2 mg was administered intravenously. The patient's level of consciousness and  vital signs were monitored continuously by radiology nursing  throughout the procedure under my direct supervision. FLUOROSCOPY: Fluoroscopic dose; 0.6 mGy COMPLICATIONS: None immediate. PROCEDURE: The procedure, risks, benefits, and alternatives were explained to the the patient and/or patient's representative, who wishes to proceed with the placement of this permanent pleural catheter as they are seeking palliative care. The the patient and/or patient's representative understands and consents to the procedure. The LEFT lateral chest and upper abdomen were prepped and draped in a sterile fashion, and a sterile drape was applied covering the operative field. A sterile gown and sterile gloves were used for the procedure. Initial ultrasound scanning and fluoroscopic imaging demonstrates a recurrent moderate to large pleural effusion. Under direct ultrasound guidance, the inferior lateral pleural space was accessed with a Yueh sheath needle after the overlying soft tissues were anesthetized with 1% lidocaine with epinephrine. An Amplatz super stiff wire was then advanced under fluoroscopy into the pleural space. A 15.5 Fr tunneled Pleur-X catheter was tunneled from an incision within the upper abdominal quadrant to the access site. The pleural access site was serially dilated under fluoroscopy, ultimately allowing placement of a peel-away sheath. The catheter was advanced through the peel-away sheath. The sheath was then removed. Final catheter positioning was confirmed with a fluoroscopic radiographic image. The access incision was closed with an interrupted subcutaneous 2-0 Vicryl and Dermabond. A 2-0 nylon retention suture was applied at the catheter exit site. Large volume thoracentesis was performed through the new catheter utilizing provided bulb vacuum assisted drainage bag. Dressings were applied. The patient tolerated the above procedure well without immediate postprocedural complication. FINDINGS: Preprocedural ultrasound scanning demonstrates a recurrent moderate sized  LEFT sided pleural effusion. After ultrasound and fluoroscopic guided placement, the catheter is directed apically Following catheter placement, approximately 500 mL of serous pleural fluid was removed. IMPRESSION: Successful placement of permanent, tunneled LEFT pleural drainage "PleurX" catheter. 500 mL of serous pleural fluid was removed following catheter placement. PLAN: Intermittent drainage schedule per Hospice and Palliative Teams. Michaelle Birks, MD [facility] Radiology Electronically Signed   By: Michaelle Birks M.D.   On: 02/03/2022 17:12   US THORACENTESIS ASP PLEURAL SPACE W/IMG GUIDE  Result Date: 02/02/2022 INDICATION: Shortness of breath, recurrent malignant pleural effusion EXAM: ULTRASOUND GUIDED RIGHT THORACENTESIS MEDICATIONS: 6 cc 1% lidocaine COMPLICATIONS: None immediate. PROCEDURE: An ultrasound guided thoracentesis was thoroughly discussed with the patient and questions answered. The benefits, risks, alternatives and complications were also discussed. The patient understands and wishes to proceed with the procedure. Written consent was obtained. Ultrasound was performed to localize and mark an adequate pocket of fluid in the right chest. The area was then prepped and draped in the normal sterile fashion. 1% Lidocaine was used for local anesthesia. Under ultrasound guidance a 6 Fr Safe-T-Centesis catheter was introduced. Thoracentesis was performed. The catheter was removed and a dressing applied. FINDINGS: A total of approximately 650 mL of clear yellow fluid was removed. IMPRESSION: Successful ultrasound guided right thoracentesis yielding 650 mL of pleural fluid. Follow-up chest x-ray revealed no evidence of pneumothorax. Read by: Reatha Armour, PA-C Electronically Signed   By: Albin Felling M.D.   On: 02/02/2022 12:20   DG Chest Port 1 View  Result Date: 02/02/2022 CLINICAL DATA:  Right thoracentesis. EXAM: PORTABLE CHEST 1 VIEW  COMPARISON:  02/01/2022 and CT chest 01/27/2022. FINDINGS: Trachea is midline. Heart size stable. Left IJ power port tip is in the SVC. Biapical pleural thickening. Bilateral nodules and masses. Small  residual right pleural effusion status post thoracentesis. No pneumothorax. Improved right basilar aeration. Left basilar collapse/consolidation and moderate left pleural effusion. IMPRESSION: 1. Small residual right pleural effusion with improved aeration in the right lung base status post thoracentesis. No pneumothorax. 2. Bilateral pulmonary metastases. 3. Moderate left pleural effusion with left basilar collapse/consolidation. Difficult to exclude pneumonia. Electronically Signed   By: Lorin Picket M.D.   On: 02/02/2022 11:22   MR BRAIN W WO CONTRAST  Result Date: 02/01/2022 CLINICAL DATA:  Initial evaluation for metastatic disease evaluation, right facial numbness. EXAM: MRI HEAD WITHOUT AND WITH CONTRAST TECHNIQUE: Multiplanar, multiecho pulse sequences of the brain and surrounding structures were obtained without and with intravenous contrast. CONTRAST:  38mL GADAVIST GADOBUTROL 1 MMOL/ML IV SOLN COMPARISON:  Comparison made with prior brain MRI from 01/06/2022. FINDINGS: Brain: Cerebral volume stable, and remains within normal limits. Scattered patchy T2/FLAIR hyperintensity involving the periventricular deep white matter both cerebral hemispheres as well as the pons, most consistent with chronic small vessel ischemic disease. No evidence for acute or subacute ischemia. Gray-white matter differentiation maintained. No areas of chronic cortical infarction. No acute or chronic intracranial blood products. Of there has been interval development of a enhancing dural based mass overlying the right posterior parieto-occipital convexity, measuring 1.4 x 1.0 x 2.7 cm (series 20001, image 9). Findings suspicious for new dural-based metastasis, and is new from prior. No significant edema or mass effect. Lesion  abuts the adjacent superior sagittal sinus without frank sinus invasion. Sinus is mildly narrowed as compared to previous exam (series 15001, image 35). Associated mild smooth dural thickening and enhancement noted overlying the adjacent posterior right cerebral convexity, likely reactive. No other evidence for intracranial metastatic disease. No other mass lesion or midline shift. No hydrocephalus or extra-axial fluid collection. Pituitary gland and suprasellar region within normal limits. Vascular: Major intracranial vascular flow voids are maintained. Skull and upper cervical spine: Craniocervical junction within normal limits. Bone marrow signal intensity normal. Enlarging soft tissue nodule at the right parieto-occipital scalp now measures 2.6 x 1.1 x 3.7 cm (series 18001, image 115). This is increased in size from prior, and concerning for a metastatic implant. Suspected involvement of the underlying calvarium given that the dural based metastasis directly underlies this region. Sinuses/Orbits: Globes orbital soft tissues demonstrate no acute finding. Paranasal sinuses are clear. No mastoid effusion. Other: None. IMPRESSION: 1. 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 Lesion abuts the adjacent superior sagittal sinus which is narrowed but remains patent at this time. 2. Enlarging 2.6 x 1.1 x 3.7 cm soft tissue nodule at the right parieto-occipital scalp, concerning for a metastatic implant. Suspected involvement of the underlying calvarium given that the dural based metastasis directly underlies this region. 3. No other evidence for intracranial metastatic disease. 4. Underlying mild chronic microvascular ischemic disease. Electronically Signed   By: Jeannine Boga M.D.   On: 02/01/2022 22:46   DG Chest Port 1 View  Result Date: 02/01/2022 CLINICAL DATA:  Shortness of  breath. Known metastatic breast cancer. EXAM: PORTABLE CHEST 1 VIEW COMPARISON:  01/29/2022 FINDINGS: Left IJ Port-A-Cath unchanged. Lungs are somewhat hypoinflated with moderate opacification over the lung bases with slight worsening in the left base. Findings are compatible with known effusions and atelectasis as well as pulmonary metastatic disease. Several pulmonary nodule opacities over the mid to upper lungs without significant change compatible with known metastatic  disease. Improved aeration over the right upper lobe may be due to improving atelectasis or infection. Cardiomediastinal silhouette and remainder of the exam is unchanged. IMPRESSION: Moderate opacification over the lung bases with slight worsening in the left base. Findings are compatible with known effusions/atelectasis as well as pulmonary metastatic disease. Improved aeration over the right upper lobe. Electronically Signed   By: Marin Olp M.D.   On: 02/01/2022 10:43   US Venous Img Lower Unilateral Left (DVT)  Result Date: 01/31/2022 CLINICAL DATA:  LEFT leg pain.  History of metastatic breast cancer. EXAM: LEFT LOWER EXTREMITY VENOUS DOPPLER ULTRASOUND TECHNIQUE: Gray-scale sonography with compression, as well as color and duplex ultrasound, were performed to evaluate the deep venous system(s) from the level of the common femoral vein through the popliteal and proximal calf veins. COMPARISON:  None Available. FINDINGS: VENOUS Normal compressibility of the common femoral, superficial femoral, and popliteal veins, as well as the visualized calf veins. Visualized portions of profunda femoral vein and great saphenous vein unremarkable. No filling defects to suggest DVT on grayscale or color Doppler imaging. Doppler waveforms show normal direction of venous flow, normal respiratory plasticity and response to augmentation. Limited views of the contralateral common femoral vein are unremarkable. OTHER No evidence of superficial  thrombophlebitis or abnormal fluid collection. Limitations: none IMPRESSION: No evidence of femoropopliteal DVT or superficial thrombophlebitis within the LEFT lower extremity. Michaelle Birks, MD [facility]iology Electronically Signed   By: Michaelle Birks M.D.   On: 01/31/2022 13:18   DG Humerus Left  Result Date: 01/30/2022 CLINICAL DATA:  Patient hurt left shoulder "pop" last night. Left shoulder pain. EXAM: LEFT HUMERUS - 2+ VIEW COMPARISON:  Left shoulder and left humerus radiographs 01/14/2022 FINDINGS: Minimal inferior humeral head-neck junction degenerative osteophytosis, similar to prior. The glenohumeral and acromioclavicular joints are appropriately aligned. No acute fracture. IMPRESSION: 1. No acute fracture. 2. Mild glenohumeral osteoarthritis. Electronically Signed   By: Yvonne Kendall M.D.   On: 01/30/2022 08:26   US THORACENTESIS ASP PLEURAL SPACE W/IMG GUIDE  Result Date: 01/29/2022 INDICATION: Patient with history of breast cancer and recurrent bilateral pleural effusions with shortness of breath, request received for left-sided thoracentesis. EXAM: ULTRASOUND GUIDED LEFT THORACENTESIS MEDICATIONS: Local 1% lidocaine only. COMPLICATIONS: None immediate. PROCEDURE: An ultrasound guided thoracentesis was thoroughly discussed with the patient and her daughter who was bedside during the procedure, all questions were answered. The benefits, risks, alternatives and complications were also discussed. The patient understands and wishes to proceed with the procedure. Written consent was obtained. Ultrasound was performed to localize and mark an adequate pocket of fluid in the left chest. The area was then prepped and draped in the normal sterile fashion. 1% Lidocaine was used for local anesthesia. Under ultrasound guidance a 19 gauge, 7-cm, Yueh catheter was introduced. Thoracentesis was performed. The catheter was removed and a dressing applied.  FINDINGS: A total of approximately 800 mL of clear yellow fluid was removed. IMPRESSION: Successful ultrasound guided left thoracentesis yielding 800 mL of pleural fluid. This exam was performed by Tsosie Billing PA-C, and was supervised and interpreted by Dr. Annamaria Boots. Electronically Signed   By: Jerilynn Mages.  Shick M.D.   On: 01/29/2022 12:18   DG Chest Port 1 View  Result Date: 01/29/2022 CLINICAL DATA:  61 year old female with shortness of breath. Breast cancer. Status post ultrasound-guided left side thoracentesis this morning. EXAM: PORTABLE CHEST 1 VIEW COMPARISON:  Portable chest 01/28/2022 and earlier. FINDINGS: Portable AP upright view at 1106  hours. Regressed left pleural effusion, improved left mid and lower lung ventilation, and no pneumothorax following thoracentesis this morning. Stable left chest power port. Stable cardiac size and mediastinal contours. Moderate right lung base opacification appears increased since yesterday. Combined pulmonary metastatic disease and pleural effusions on recent chest CT 01/27/2022. Stable cardiac size and mediastinal contours. Visualized tracheal air column is within normal limits. Stable visualized osseous structures. Negative visible bowel gas. IMPRESSION: 1. Regressed left pleural effusion, improved left lung ventilation, and no pneumothorax following thoracentesis. 2. Underlying pulmonary metastatic disease in addition to pleural effusions with worsening right lower lung base ventilation since yesterday. Electronically Signed   By: Genevie Ann M.D.   On: 01/29/2022 11:19   US THORACENTESIS ASP PLEURAL SPACE W/IMG GUIDE  Result Date: 01/28/2022 INDICATION: Recurrent malignant bilateral pleural effusions. Request received for diagnostic and therapeutic thoracentesis EXAM: ULTRASOUND GUIDED RIGHT THORACENTESIS MEDICATIONS: 5 cc 1% lidocaine COMPLICATIONS: None immediate. PROCEDURE: An ultrasound guided thoracentesis was thoroughly discussed with the patient and questions  answered. The benefits, risks, alternatives and complications were also discussed. The patient understands and wishes to proceed with the procedure. Written consent was obtained. Ultrasound was performed to localize and mark an adequate pocket of fluid in the right chest. The area was then prepped and draped in the normal sterile fashion. 1% Lidocaine was used for local anesthesia. Under ultrasound guidance a 6 Fr Safe-T-Centesis catheter was introduced. Thoracentesis was performed. The catheter was removed and a dressing applied. FINDINGS: A total of approximately 950 mL of clear yellow fluid was removed. Samples were sent to the laboratory as requested by the clinical team. IMPRESSION: Successful ultrasound guided right thoracentesis yielding 950 mL of pleural fluid. Follow-up chest x-ray revealed no evidence of pneumothorax. Read by: Reatha Armour, PA-C Electronically Signed   By: Ruthann Cancer M.D.   On: 01/28/2022 12:00   DG Chest Port 1 View  Result Date: 01/28/2022 CLINICAL DATA:  Shortness of breath, status post thoracentesis on the right. EXAM: PORTABLE CHEST 1 VIEW COMPARISON:  Chest radiograph dated January 27, 2022 FINDINGS: Status post right thoracentesis with interval decrease in size of the right pleural effusion. Appreciable pneumothorax. Hazy opacities in the right lower lobe suggesting atelectasis or infiltrate. Bilateral scattered lung opacities consistent with patient's known metastatic disease. Small to moderate left pleural effusion. Left subclavian access MediPort with distal tip in the SVC, unchanged. No acute osseous abnormality IMPRESSION: 1. Status post right thoracentesis with interval decrease in size of the right pleural effusion. No pneumothorax. 2. Hazy opacities in the right lower lobe suggesting atelectasis or infiltrate. 3. Small to moderate left pleural effusion. Electronically Signed   By: Keane Police D.O.   On: 01/28/2022 11:35   CT Chest W Contrast  Result Date:  01/27/2022 CLINICAL DATA:  Lung nodule. Shortness of breath and fever. History of breast cancer. EXAM: CT CHEST WITH CONTRAST TECHNIQUE: Multidetector CT imaging of the chest was performed during intravenous contrast administration. RADIATION DOSE REDUCTION: This exam was performed according to the departmental dose-optimization program which includes automated exposure control, adjustment of the mA and/or kV according to patient size and/or use of iterative reconstruction technique. CONTRAST:  29mL OMNIPAQUE IOHEXOL 300 MG/ML  SOLN COMPARISON:  CT angiogram chest 01/21/2022. CT chest abdomen and pelvis 11/27/2021. FINDINGS: Cardiovascular: No significant vascular findings. Normal heart size. No pericardial effusion. Left chest port is present with distal catheter tip ending in the SVC. Mediastinum/Nodes: Visualized thyroid gland and esophagus appear within normal limits. Enlarged precarinal lymph  node is stable measuring up to 1 cm. Enlarged right hilar lymph nodes are present with the largest measuring 2.1 x 3.2 cm (unchanged). Enlarged left hilar lymph nodes measure up to 1.5 by 2.2 cm (unchanged) lower mediastinal/paraesophageal lymph nodes have increased in size for example image 2/82 measuring 2.2 x 3.3 cm (previously 1.6 x 2.6 cm). Lungs/Pleura: Moderate bilateral pleural effusions have increased from prior. Bilateral pulmonary nodules have increased in size and number for example left upper lobe measuring 2.5 by 2.4 cm (previously 2.0 x 1.5 cm). There is new airspace consolidation in the bilateral lower lobes. There is new confluent masslike opacity in the infrahilar region extending into the inferior right upper lobe. There is complete collapse of the right middle lobe which is new from prior. Upper Abdomen: There is a rounded hypodensity in the spleen measuring 9 mm, new from prior. Musculoskeletal: No chest wall abnormality. No acute or significant osseous findings. IMPRESSION: 1. Worsening pulmonary  metastatic disease most significant in the right upper lobe. 2. Worsening mediastinal and hilar lymphadenopathy. 3. Increasing moderate bilateral pleural effusions. 4. New airspace disease in the bilateral lower lobes worrisome for pneumonia. 5. New complete collapse of the right middle lobe. 6. New 9 mm hypodensity in the spleen worrisome for metastatic disease. Electronically Signed   By: Ronney Asters M.D.   On: 01/27/2022 18:34   DG Chest Port 1 View  Result Date: 01/27/2022 CLINICAL DATA:  Sepsis.  Stage IV breast cancer. EXAM: PORTABLE CHEST 1 VIEW COMPARISON:  01/23/2022, chest CT 01/21/2022 FINDINGS: Left IJ Port-A-Cath unchanged. Interval worsening opacification over the mid to lower lungs bilaterally compatible with moderate size effusions with associated basilar atelectasis and known metastatic disease. Few small nodular opacities over the mid to upper lungs as seen on recent CT compatible with known metastatic disease. Remainder of the exam is unchanged. IMPRESSION: Interval worsening opacification over the mid to lower lungs bilaterally compatible with moderate size effusions with associated basilar atelectasis and known metastatic disease. Electronically Signed   By: Marin Olp M.D.   On: 01/27/2022 16:33   US THORACENTESIS ASP PLEURAL SPACE W/IMG GUIDE  Result Date: 01/23/2022 INDICATION: Patient with a history of breast cancer and recurrent pleural effusions. Interventional radiology asked to perform a therapeutic thoracentesis. EXAM: ULTRASOUND GUIDED THORACENTESIS MEDICATIONS: 1% lidocaine 10 mL COMPLICATIONS: None immediate. PROCEDURE: An ultrasound guided thoracentesis was thoroughly discussed with the patient and questions answered. The benefits, risks, alternatives and complications were also discussed. The patient understands and wishes to proceed with the procedure. Written consent was obtained. Ultrasound was performed to localize and mark an adequate pocket of fluid in the left  chest. The area was then prepped and draped in the normal sterile fashion. 1% Lidocaine was used for local anesthesia. Under ultrasound guidance a 6 Fr Safe-T-Centesis catheter was introduced. Thoracentesis was performed. The catheter was removed and a dressing applied. FINDINGS: A total of approximately 550 mL of clear yellow fluid was removed. IMPRESSION: Successful ultrasound guided left thoracentesis yielding 550 mL of pleural fluid. Read by: Soyla Dryer, NP Electronically Signed   By: Albin Felling M.D.   On: 01/23/2022 15:03   DG Chest Port 1 View  Result Date: 01/23/2022 CLINICAL DATA:  Status post thoracentesis. EXAM: PORTABLE CHEST 1 VIEW COMPARISON:  Radiographs 01/21/2022 and 01/12/2022.  CT 01/21/2022. FINDINGS: 1435 hours. The patient remains mildly rotated to the right. Left IJ central venous catheter appears unchanged at the level of the lower SVC. The heart size and mediastinal  contours are stable. Left pleural effusion has decreased in volume following thoracentesis. No evidence of pneumothorax. Right pleural effusion and bibasilar airspace opacities have not significantly changed. The bones appear unremarkable. IMPRESSION: Decreased left pleural effusion following thoracentesis. No evidence of pneumothorax. No significant change in right pleural effusion and bibasilar airspace opacities. Electronically Signed   By: Richardean Sale M.D.   On: 01/23/2022 14:57   US Venous Img Lower Bilateral (DVT)  Result Date: 01/22/2022 CLINICAL DATA:  Metastatic breast cancer, dyspnea, chest pain, elevated D-dimer EXAM: BILATERAL LOWER EXTREMITY VENOUS DOPPLER ULTRASOUND TECHNIQUE: Gray-scale sonography with compression, as well as color and duplex ultrasound, were performed to evaluate the deep venous system(s) from the level of the common femoral vein through the popliteal and proximal calf veins. COMPARISON:  None Available. FINDINGS: VENOUS Normal compressibility of the common femoral,  superficial femoral, and popliteal veins, as well as the visualized calf veins. Visualized portions of profunda femoral vein and great saphenous vein unremarkable. No filling defects to suggest DVT on grayscale or color Doppler imaging. Doppler waveforms show normal direction of venous flow, normal respiratory plasticity and response to augmentation. OTHER None. Limitations: none IMPRESSION: 1. Negative. Electronically Signed   By: Fidela Salisbury M.D.   On: 01/22/2022 10:00   CT Angio Chest PE W and/or Wo Contrast  Result Date: 01/21/2022 CLINICAL DATA:  Bilateral breast cancer, shortness of breath EXAM: CT ANGIOGRAPHY CHEST WITH CONTRAST TECHNIQUE: Multidetector CT imaging of the chest was performed using the standard protocol during bolus administration of intravenous contrast. Multiplanar CT image reconstructions and MIPs were obtained to evaluate the vascular anatomy. RADIATION DOSE REDUCTION: This exam was performed according to the departmental dose-optimization program which includes automated exposure control, adjustment of the mA and/or kV according to patient size and/or use of iterative reconstruction technique. CONTRAST:  75 mL OMNIPAQUE IOHEXOL 350 MG/ML SOLN COMPARISON:  11/27/2021 FINDINGS: Cardiovascular: Satisfactory opacification of the pulmonary arteries to the segmental level. No evidence of pulmonary embolism. Mild cardiomegaly. Small pericardial effusion, about 9 mm in thickness. Heart size. No pericardial effusion. Mediastinum/Nodes: Extensive suspicious mediastinal, hilar, axillary and subdiaphragmatic adenopathy is seen which represents progression. The largest hilar node is on the right measuring 3.3 cm. Mediastinal adenopathy is confluent. Supraclavicular masses on the left up to 2.2 cm consistent with metastasis. There is a left axillary mass that measures 3.6 cm as well as numerous additional suspicious left axillary nodes. Lungs/Pleura: There is extensive bibasilar consolidation  with moderate bilateral pleural effusions and numerous pulmonary nodules and masses which represents significant interval progression of disease. The largest discrete lung mass on the right in the lower lobe is 4.5 cm. Right upper lobe area of consolidation in the and mass is about 5 cm. There are innumerable additional nodules throughout both lungs are of varying sizes. Upper Abdomen: Imaged portions of the upper abdominal organs are unremarkable. Musculoskeletal: Thoracic degenerative changes noted. Left-sided Port-A-Cath tip is in the distal SVC. Review of the MIP images confirms the above findings. IMPRESSION: 1. No evidence of pulmonary embolism, aortic aneurysm or dissection. 2. Marked progression of disease with increase in size and number of lung nodules as well as extensive mediastinal, hilar, subdiaphragmatic, supraclavicular and left axillary adenopathy. 3. Bilateral pleural effusions. 4. Areas of alveolar pulmonary parenchymal consolidation could represent superimposed pneumonia. Electronically Signed   By: Sammie Bench M.D.   On: 01/21/2022 18:36   DG Chest 2 View  Result Date: 01/21/2022 CLINICAL DATA:  Shortness of breath. History of metastatic breast  cancer. EXAM: CHEST - 2 VIEW COMPARISON:  Chest x-ray dated January 12, 2022. FINDINGS: Unchanged left chest wall port catheter. The heart size and mediastinal contours are within normal limits. Increased small bilateral pleural effusions with worsening aeration at both lung bases. Multiple pulmonary nodules in both lungs are not significantly changed. No pneumothorax. No acute osseous abnormality. IMPRESSION: 1. Increased small bilateral pleural effusions with worsening bibasilar atelectasis and/or pneumonia. 2. Unchanged pulmonary metastatic disease. Electronically Signed   By: Titus Dubin M.D.   On: 01/21/2022 13:34   [place] CHEST (PLEURAL EFFUSION)  Result Date: 01/14/2022 CLINICAL DATA:  61 year old female with recurrent pleural  effusion EXAM: CHEST ULTRASOUND COMPARISON:  01/02/2022 FINDINGS: Ultrasound performed with potential thoracentesis. Ultrasound demonstrates small pleural effusion on the right and the left. Results were shared with the patient who deferred thoracentesis at this time. IMPRESSION: Small bilateral pleural effusions. Electronically Signed   By: Corrie Mckusick D.O.   On: 01/14/2022 16:26   DG Shoulder Left  Result Date: 01/14/2022 CLINICAL DATA:  61 year old female with sudden onset sharp pain radiating to the arm pit and chest. Metastatic breast cancer. EXAM: LEFT SHOULDER - 2+ VIEW COMPARISON:  Left humerus series today. Recent chest radiographs 01/12/2022. FINDINGS: No glenohumeral joint dislocation. Proximal left humerus appears intact. Bone mineralization is within normal limits for age. No acute osseous abnormality identified. Left chest power port and pleural effusion do not appear significantly changed from 01/12/2022. Visible left ribs appear grossly intact. IMPRESSION: 1. No osseous abnormality identified about the left shoulder. 2. Left pleural effusion not significantly changed from recent 01/12/2022 radiographs. Electronically Signed   By: Genevie Ann M.D.   On: 01/14/2022 10:29   DG Humerus Left  Result Date: 01/14/2022 CLINICAL DATA:  61 year old female with sudden onset sharp pain radiating to the arm pit and chest. EXAM: LEFT HUMERUS - 2+ VIEW COMPARISON:  Left forearm series today. FINDINGS: Bone mineralization is within normal limits. Alignment appears grossly maintained at the left shoulder and elbow. There is no evidence of fracture or other focal bone lesions. Soft tissues are unremarkable. Partially visible left lung base veiling opacity suggesting pleural effusion. Left chest power port partially visible. IMPRESSION: 1. No osseous abnormality identified about the left humerus. 2. Partially visible left pleural effusion, Port-A-Cath. Electronically Signed   By: Genevie Ann M.D.   On: 01/14/2022  10:26   DG Forearm Left  Result Date: 01/14/2022 CLINICAL DATA:  61 year old female with sudden onset sharp pain radiating to the arm pit and chest. EXAM: LEFT FOREARM - 2 VIEW COMPARISON:  None Available. FINDINGS: Bone mineralization is within normal limits. There is no evidence of fracture or other focal bone lesions. No joint effusion is evident at the left elbow. Soft tissue contours appear within normal limits. IMPRESSION: Negative. Electronically Signed   By: Genevie Ann M.D.   On: 01/14/2022 10:25   DG Chest 2 View  Result Date: 01/12/2022 CLINICAL DATA:  Shortness of breath. History of pleural effusion. History of breast cancer. EXAM: CHEST - 2 VIEW COMPARISON:  Chest two views 01/02/2022 and 01/01/2022; CT chest abdomen and pelvis 11/27/2021 FINDINGS: Cardiac silhouette and mediastinal contours are within normal limits. Left chest wall porta catheter tip overlies the central superior vena cava. Mild-to-moderate left and trace right pleural effusions are not significantly changed. Approximately 2 cm left lateral midlung nodular density appears unchanged from 01/01/2022 frontal radiograph. The more accurate measurement on CT on 11/27/2021 was up to approximately 1.2 cm. Of the right mid  to lower lung smaller nodules as better seen on prior CT. Posteroinferior opacity on lateral view corresponding to the dominant 4 cm posteroinferior left lower lobe mass on prior CT. No pneumothorax.  Mild bilateral lower lung interstitial thickening. Moderate multilevel degenerative disc changes of the thoracic spine. IMPRESSION: 1. Mild-to-moderate left and trace right pleural effusions are not significantly changed from 01/02/2022. 2. Posteroinferior opacity on lateral view corresponding to the dominant 4 cm posteroinferior left lower lobe mass on prior CT. Additional bilateral pulmonary nodules again consistent with pulmonary metastases. Electronically Signed   By: Yvonne Kendall M.D.   On: 01/12/2022 16:37   MR  Brain W Wo Contrast  Result Date: 01/07/2022 CLINICAL DATA:  Evaluate for metastatic disease. Breast cancer staging EXAM: MRI HEAD WITHOUT AND WITH CONTRAST TECHNIQUE: Multiplanar, multiecho pulse sequences of the brain and surrounding structures were obtained without and with intravenous contrast. CONTRAST:  68mL GADAVIST GADOBUTROL 1 MMOL/ML IV SOLN COMPARISON:  06/18/2021 FINDINGS: Brain: No enhancement or swelling to suggest metastatic disease. Mild chronic small vessel ischemia in the cerebral white matter and pons. No acute or subacute infarct, hemorrhage, hydrocephalus, or collection Vascular: Normal flow voids and vascular enhancements Skull and upper cervical spine: Posterior scalp nodule closely associated with the right para median calvarium, see 16:113, not seen on priors but also not hypermetabolic on recent PET CT. The nodule measures 10 x 3 mm, attention on follow-up. Sinuses/Orbits: Negative IMPRESSION: 1. Negative for metastatic disease to the brain. 2. New, small posterior scalp nodule which would be concerning for a metastatic deposit but was not hypermetabolic on recent PET, question recent trauma. Electronically Signed   By: Jorje Guild M.D.   On: 01/07/2022 12:38    Microbiology: Results for orders placed or performed during the hospital encounter of 01/27/22  Culture, blood (Routine x 2)     Status: None   Collection Time: 01/27/22  3:45 PM   Specimen: BLOOD  Result Value Ref Range Status   Specimen Description BLOOD CENTRAL LINE  Final   Special Requests   Final    BOTTLES DRAWN AEROBIC AND ANAEROBIC Blood Culture adequate volume   Culture   Final    NO GROWTH 5 DAYS Performed at [facility], [address] [place], [place] [address]    Report Status 02/01/2022 FINAL  Final  Culture, blood (Routine x 2)     Status: None   Collection Time: 01/27/22  5:35 PM   Specimen: BLOOD  Result Value Ref Range Status   Specimen Description BLOOD LEFT ANTECUBITAL   Final   Special Requests   Final    BOTTLES DRAWN AEROBIC AND ANAEROBIC Blood Culture adequate volume   Culture   Final    NO GROWTH 5 DAYS Performed at [facility], [address] [place], [place] [address]    Report Status 02/01/2022 FINAL  Final  Resp Panel by RT-PCR (Flu A&B, Covid) Anterior Nasal Swab     Status: None   Collection Time: 01/27/22 11:00 PM   Specimen: Anterior Nasal Swab  Result Value Ref Range Status   SARS Coronavirus 2 by RT PCR NEGATIVE NEGATIVE Final    Comment: (NOTE) SARS-CoV-2 target nucleic acids are NOT DETECTED.  The SARS-CoV-2 RNA is generally detectable in upper respiratory specimens during the acute phase of infection. The lowest concentration of SARS-CoV-2 viral copies this assay can detect is 138 copies/mL. A negative result does not preclude SARS-Cov-2 infection and should not be used as the sole basis  for treatment or other patient management decisions. A negative result may occur with  improper specimen collection/handling, submission of specimen other than nasopharyngeal swab, presence of viral mutation(s) within the areas targeted by this assay, and inadequate number of viral copies(<138 copies/mL). A negative result must be combined with clinical observations, patient history, and epidemiological information. The expected result is Negative.  Fact Sheet for Patients:  EntrepreneurPulse.com.au  Fact Sheet for Healthcare Providers:  IncredibleEmployment.be  This test is no t yet approved or cleared by the [place] FDA and  has been authorized for detection and/or diagnosis of SARS-CoV-2 by FDA under an Emergency Use Authorization (EUA). This EUA will remain  in effect (meaning this test can be used) for the duration of the COVID-19 declaration under Section 564(b)(1) of the Act, 21 U.S.C.section 360bbb-3(b)(1), unless the authorization is terminated  or revoked sooner.        Influenza A by PCR NEGATIVE NEGATIVE Final   Influenza B by PCR NEGATIVE NEGATIVE Final    Comment: (NOTE) The Xpert Xpress SARS-CoV-2/FLU/RSV plus assay is intended as an aid in the diagnosis of influenza from Nasopharyngeal swab specimens and should not be used as a sole basis for treatment. Nasal washings and aspirates are unacceptable for Xpert Xpress SARS-CoV-2/FLU/RSV testing.  Fact Sheet for Patients: EntrepreneurPulse.com.au  Fact Sheet for Healthcare Providers: IncredibleEmployment.be  This test is not yet approved or cleared by the [place] FDA and has been authorized for detection and/or diagnosis of SARS-CoV-2 by FDA under an Emergency Use Authorization (EUA). This EUA will remain in effect (meaning this test can be used) for the duration of the COVID-19 declaration under Section 564(b)(1) of the Act, 21 U.S.C. section 360bbb-3(b)(1), unless the authorization is terminated or revoked.  Performed at [facility], [address] [place], [place] [address]   MRSA Next Gen by PCR, Nasal     Status: None   Collection Time: 01/28/22  9:00 AM   Specimen: Nasal Mucosa; Nasal Swab  Result Value Ref Range Status   MRSA by PCR Next Gen NOT DETECTED NOT DETECTED Final    Comment: (NOTE) The GeneXpert MRSA Assay (FDA approved for NASAL specimens only), is one component of a comprehensive MRSA colonization surveillance program. It is not intended to diagnose MRSA infection nor to guide or monitor treatment for MRSA infections. Test performance is not FDA approved in patients less than 4 years old. Performed at [facility], [place], [place] [address]   Respiratory (~20 pathogens) panel by PCR     Status: None   Collection Time: 01/28/22  9:00 AM   Specimen: Nasopharyngeal Swab; Respiratory  Result Value Ref Range Status   Adenovirus NOT DETECTED NOT DETECTED Final   Coronavirus 229E NOT DETECTED  NOT DETECTED Final    Comment: (NOTE) The Coronavirus on the Respiratory Panel, DOES NOT test for the novel  Coronavirus (2019 nCoV)    Coronavirus HKU1 NOT DETECTED NOT DETECTED Final   Coronavirus NL63 NOT DETECTED NOT DETECTED Final   Coronavirus OC43 NOT DETECTED NOT DETECTED Final   Metapneumovirus NOT DETECTED NOT DETECTED Final   Rhinovirus / Enterovirus NOT DETECTED NOT DETECTED Final   Influenza A NOT DETECTED NOT DETECTED Final   Influenza B NOT DETECTED NOT DETECTED Final   Parainfluenza Virus 1 NOT DETECTED NOT DETECTED Final   Parainfluenza Virus 2 NOT DETECTED NOT DETECTED Final   Parainfluenza Virus 3 NOT DETECTED NOT DETECTED Final   Parainfluenza Virus 4 NOT DETECTED  NOT DETECTED Final   Respiratory Syncytial Virus NOT DETECTED NOT DETECTED Final   Bordetella pertussis NOT DETECTED NOT DETECTED Final   Bordetella Parapertussis NOT DETECTED NOT DETECTED Final   Chlamydophila pneumoniae NOT DETECTED NOT DETECTED Final   Mycoplasma pneumoniae NOT DETECTED NOT DETECTED Final    Comment: Performed at [facility], [place] [address], [place], [place] [address]    Labs: CBC: Recent Labs  Lab 01/28/22 0450 01/29/22 0525 01/31/22 0500 02/01/22 0600 02/02/22 0510  WBC 7.9 9.8 9.4 9.3 14.3*  NEUTROABS  --  8.7* 8.5*  --   --   HGB 10.0* 9.7* 9.3* 8.7* 9.9*  HCT 29.5* 28.2* 27.2* 26.3* 30.6*  MCV 94.6 95.3 94.4 95.6 96.8  PLT 248 218 255 271 657   Basic Metabolic Panel: Recent Labs  Lab 01/29/22 0525 01/30/22 0530 01/31/22 0500 02/01/22 0600 02/02/22 0510  NA 131* 131* 132* 138 141  K 3.3* 3.0* 3.0* 3.5 3.5  CL 91* 92* 90* 97* 100  CO2 32 34* 34* 37* 33*  GLUCOSE 132* 122* 128* 144* 134*  BUN 7* 6* 5* 11 11  CREATININE 0.56 0.54 0.53 0.59 0.54  CALCIUM 8.9 8.9 8.6* 9.2 10.0  MG  --  1.6* 2.1  --   --   PHOS  --   --  2.1* 3.4  --    Liver Function Tests: Recent Labs  Lab 01/31/22 0500  AST 23  ALT 16  ALKPHOS 80  BILITOT 0.6  PROT 5.3*   ALBUMIN 2.0*     Discharge time spent: greater than 30 minutes.  Signed: Loletha Grayer, MD [facility] 02/03/2022

## 2022-02-03 NOTE — Progress Notes (Signed)
OT Cancellation Note  Patient Details Name: Amanda Skinner MRN: [id number] DOB: 1960/07/28   Cancelled Treatment:    Reason Eval/Treat Not Completed: Medical issues which prohibited therapy. OT arriving to see pt but HR elevated to 120's at rest and per chart review RN requesting therapy hold secondary to HR with mobility. OT to hold this afternoon and re-attempt when pt is next able to actively participate in therapeutic intervention.   Darleen Crocker, MS, OTR/L , CBIS ascom [phone]  02/03/22, 2:00 PM

## 2022-02-03 NOTE — Progress Notes (Signed)
Mobility Specialist - Progress Note    02/03/22 1004  Mobility  Activity Ambulated with assistance in hallway;Stood at bedside  Level of Assistance Contact guard assist, steadying assist  Assistive Device Other (Comment) (Me (HHA))  Distance Ambulated (ft) 180 ft  Activity Response Tolerated well  Mobility Referral Yes  $Mobility charge 1 Mobility   Pt resting in bed on 5L upon entry. Pt STS and ambulates in hallway around NS CGA with HHA. Pt SpO2 consistant at 95% during ambulation. Pt telemetry monitor reporting pt tachy and missed beat. RN Notified. Pt returned to bed and left with needs in reach. Left with RN and family present in room.    [name] Mobility Specialist 02/03/22, 10:11 AM

## 2022-02-03 NOTE — Progress Notes (Signed)
Patient clinically stable post Left Pleurx catheter placement per Dr Maryelizabeth Kaufmann, with vitals unchanged post procedure. Per patient preference only Versed 2 mg IV used for procedure. Family at bedside post procedure with update given. Report given at bedside to nurse 249. 450 ml serous pink/red fluid removed post placement of pleurx catheter.

## 2022-02-04 ENCOUNTER — Other Ambulatory Visit: Payer: Self-pay

## 2022-02-04 ENCOUNTER — Telehealth: Payer: Self-pay | Admitting: *Deleted

## 2022-02-04 DIAGNOSIS — J45909 Unspecified asthma, uncomplicated: Secondary | ICD-10-CM | POA: Diagnosis not present

## 2022-02-04 DIAGNOSIS — C7800 Secondary malignant neoplasm of unspecified lung: Secondary | ICD-10-CM | POA: Diagnosis not present

## 2022-02-04 DIAGNOSIS — C7951 Secondary malignant neoplasm of bone: Secondary | ICD-10-CM | POA: Diagnosis not present

## 2022-02-04 DIAGNOSIS — C50919 Malignant neoplasm of unspecified site of unspecified female breast: Secondary | ICD-10-CM | POA: Diagnosis not present

## 2022-02-04 DIAGNOSIS — D630 Anemia in neoplastic disease: Secondary | ICD-10-CM | POA: Diagnosis not present

## 2022-02-04 DIAGNOSIS — K219 Gastro-esophageal reflux disease without esophagitis: Secondary | ICD-10-CM | POA: Diagnosis not present

## 2022-02-04 DIAGNOSIS — R1310 Dysphagia, unspecified: Secondary | ICD-10-CM | POA: Diagnosis not present

## 2022-02-04 DIAGNOSIS — J302 Other seasonal allergic rhinitis: Secondary | ICD-10-CM | POA: Diagnosis not present

## 2022-02-04 DIAGNOSIS — C771 Secondary and unspecified malignant neoplasm of intrathoracic lymph nodes: Secondary | ICD-10-CM | POA: Diagnosis not present

## 2022-02-04 DIAGNOSIS — J90 Pleural effusion, not elsewhere classified: Secondary | ICD-10-CM | POA: Diagnosis not present

## 2022-02-04 LAB — QUANTIFERON-TB GOLD PLUS (RQFGPL)
QuantiFERON Mitogen Value: 0.99 IU/mL
QuantiFERON Nil Value: 0 IU/mL
QuantiFERON TB1 Ag Value: 0 IU/mL
QuantiFERON TB2 Ag Value: 0 IU/mL

## 2022-02-04 LAB — MISC LABCORP TEST (SEND OUT)
Labcorp test code: 164284
Labcorp test code: 832599

## 2022-02-04 LAB — QUANTIFERON-TB GOLD PLUS: QuantiFERON-TB Gold Plus: NEGATIVE

## 2022-02-04 NOTE — Telephone Encounter (Signed)
I received a fax for revision of FMLA for [facility] as patient condition has changed and need for time off has thus changed. Form update and sent for initials

## 2022-02-04 NOTE — Telephone Encounter (Signed)
Form completed initialed by Sharion Dove, NP and faxed back to [facility]

## 2022-02-05 ENCOUNTER — Emergency Department: Payer: 59

## 2022-02-05 ENCOUNTER — Other Ambulatory Visit: Payer: Self-pay

## 2022-02-05 ENCOUNTER — Emergency Department
Admission: EM | Admit: 2022-02-05 | Discharge: 2022-02-05 | Disposition: A | Discharge status: Home / Self Care | Payer: 59 | Attending: Emergency Medicine | Admitting: Emergency Medicine

## 2022-02-05 ENCOUNTER — Encounter: Payer: Self-pay | Admitting: Oncology

## 2022-02-05 ENCOUNTER — Encounter: Payer: Self-pay | Admitting: Emergency Medicine

## 2022-02-05 DIAGNOSIS — J45909 Unspecified asthma, uncomplicated: Secondary | ICD-10-CM | POA: Diagnosis not present

## 2022-02-05 DIAGNOSIS — M25569 Pain in unspecified knee: Secondary | ICD-10-CM | POA: Diagnosis not present

## 2022-02-05 DIAGNOSIS — S72492A Other fracture of lower end of left femur, initial encounter for closed fracture: Secondary | ICD-10-CM | POA: Diagnosis not present

## 2022-02-05 DIAGNOSIS — K219 Gastro-esophageal reflux disease without esophagitis: Secondary | ICD-10-CM | POA: Diagnosis not present

## 2022-02-05 DIAGNOSIS — C771 Secondary and unspecified malignant neoplasm of intrathoracic lymph nodes: Secondary | ICD-10-CM | POA: Diagnosis not present

## 2022-02-05 DIAGNOSIS — W19XXXA Unspecified fall, initial encounter: Secondary | ICD-10-CM | POA: Diagnosis not present

## 2022-02-05 DIAGNOSIS — S8992XA Unspecified injury of left lower leg, initial encounter: Secondary | ICD-10-CM | POA: Diagnosis present

## 2022-02-05 DIAGNOSIS — C50919 Malignant neoplasm of unspecified site of unspecified female breast: Secondary | ICD-10-CM | POA: Diagnosis not present

## 2022-02-05 DIAGNOSIS — R1310 Dysphagia, unspecified: Secondary | ICD-10-CM | POA: Diagnosis not present

## 2022-02-05 DIAGNOSIS — M79605 Pain in left leg: Secondary | ICD-10-CM | POA: Diagnosis not present

## 2022-02-05 DIAGNOSIS — J90 Pleural effusion, not elsewhere classified: Secondary | ICD-10-CM | POA: Diagnosis not present

## 2022-02-05 DIAGNOSIS — R000 Tachycardia, unspecified: Secondary | ICD-10-CM | POA: Diagnosis not present

## 2022-02-05 DIAGNOSIS — R0902 Hypoxemia: Secondary | ICD-10-CM | POA: Diagnosis not present

## 2022-02-05 DIAGNOSIS — S79929A Unspecified injury of unspecified thigh, initial encounter: Secondary | ICD-10-CM | POA: Diagnosis not present

## 2022-02-05 DIAGNOSIS — J302 Other seasonal allergic rhinitis: Secondary | ICD-10-CM | POA: Diagnosis not present

## 2022-02-05 DIAGNOSIS — C7951 Secondary malignant neoplasm of bone: Secondary | ICD-10-CM | POA: Diagnosis not present

## 2022-02-05 DIAGNOSIS — C7800 Secondary malignant neoplasm of unspecified lung: Secondary | ICD-10-CM | POA: Diagnosis not present

## 2022-02-05 DIAGNOSIS — D630 Anemia in neoplastic disease: Secondary | ICD-10-CM | POA: Diagnosis not present

## 2022-02-05 MED ORDER — MORPHINE SULFATE (PF) 4 MG/ML IV SOLN
4.0000 mg | Freq: Once | INTRAVENOUS | Status: AC
  Administered 2022-02-05: 4 mg via INTRAVENOUS
  Filled 2022-02-05: qty 1

## 2022-02-05 MED ORDER — KETAMINE HCL 50 MG/5ML IJ SOSY
15.0000 mg | PREFILLED_SYRINGE | Freq: Once | INTRAMUSCULAR | Status: AC
  Administered 2022-02-05: 15 mg via INTRAVENOUS
  Filled 2022-02-05: qty 5

## 2022-02-05 MED ORDER — ONDANSETRON HCL 4 MG/2ML IJ SOLN
4.0000 mg | Freq: Once | INTRAMUSCULAR | Status: AC
  Administered 2022-02-05: 4 mg via INTRAVENOUS
  Filled 2022-02-05: qty 2

## 2022-02-05 NOTE — ED Notes (Signed)
[facility]  CALLED  TO  TRANSPORT  PT  TO  [facility]  HOME

## 2022-02-05 NOTE — ED Notes (Signed)
Transport called [facility] to carry pt home

## 2022-02-05 NOTE — ED Notes (Signed)
MD Tamala Julian and this RN placed knee brace/immobilizer to L leg without incident. PMS intact post placement. No complaints voiced.

## 2022-02-05 NOTE — Progress Notes (Signed)
[facility] ED [facility]) Hospice hospital liaison note  Amanda Skinner is a current [facility] patient who was admitted to services on 12.13 and subsequently had a fall in the bathroom and is now suffering from a fractured femur with intractable pain. The plan will be to transfer her to the hospice home.   Report can be called at any time to [phone], please leave IV intact.   Thank you for the opportunity to participate in this patient's care Jhonnie Garner, BSN, RN [phone]

## 2022-02-05 NOTE — ED Provider Notes (Signed)
[facility] Provider Note    Event Date/Time   First MD Initiated Contact with Patient 02/05/22 [phone]     (approximate)   History   Leg Pain   HPI  Amanda Skinner is a 61 y.o. female who presents to the ED for evaluation of Leg Pain   I reviewed medical DC summary from 12/12, 1.5 days ago.  She was admitted for 1 week due to hypoxic respiratory failure from an obstructive pneumonia associated with metastatic breast cancer, malignant pleural effusion.  DNR/DNI.  Discharged on 5 L, went home with hospice.  Patient presents from home with her daughter, of note her daughter is a physician a radiologist, due to a witnessed fall.  The daughter was helped the patient to the restroom to void.  Patient reports a turning or twisting motion, causing her to fall due to her left knee buckling.  No syncope or head trauma.  No further trauma or acute pain down her left knee.  Physical Exam   Triage Vital Signs: ED Triage Vitals  Enc Vitals Group     BP 02/05/22 0436 111/63     Pulse Rate 02/05/22 0436 (!) 115     Resp 02/05/22 0436 20     Temp 02/05/22 0436 97.7 F (36.5 C)     Temp Source 02/05/22 0436 Oral     SpO2 02/05/22 0436 96 %     Weight 02/05/22 0435 136 lb 14.5 oz (62.1 kg)     Height 02/05/22 0435 4\' 11"  (1.499 m)     Head Circumference --      Peak Flow --      Pain Score --      Pain Loc --      Pain Edu? --      Excl. in [place]? --     Most recent vital signs: Vitals:   02/05/22 0436  BP: 111/63  Pulse: (!) 115  Resp: 20  Temp: 97.7 F (36.5 C)  SpO2: 96%    General: Awake, no distress.  CV:  Good peripheral perfusion.  Resp:  Normal effort.  Abd:  No distention.  MSK:  Close deformity to the left knee and distal femur, tender to palpation.  Right foot is distally neurovascularly intact. Neuro:  No focal deficits appreciated. Other:     ED Results / Procedures / Treatments   Labs (all labs ordered are listed, but only abnormal  results are displayed) Labs Reviewed - No data to display  EKG   RADIOLOGY Plain film the left knee interpreted by me with a displaced distal femur fracture  Official radiology report(s): DG Knee Complete 4 Views Left  Result Date: 02/05/2022 CLINICAL DATA:  Left femoral fracture. EXAM: LEFT KNEE - COMPLETE 4+ VIEW COMPARISON:  None Available. FINDINGS: There is normal bone mineralization. There is acute oblique distal left femoral diametaphyseal fracture extending to just above the level of the condyles. The distal fracture fragment is displaced posteriorly by an entire bone width, and also cephalad displaced with dorsal overriding. There is about 1/3 of a shaft width of medial displacement of the main distal fragment as well. The fracture margin of the main proximal fragment superimposes in the area of the suprapatellar bursa of the knee joint space. Also noted is a linear chip fracture of the dorsal surface of the fibular head, but this appears chronic. Arthritic changes are not seen. The suprapatellar bursa is obscured by the proximal fracture fragment. Proximal tibia appears grossly  intact. IMPRESSION: 1. Acute displaced distal left femoral diametaphyseal fracture with dorsal overriding. 2. Linear chip fracture of the dorsal surface of the fibular head, but this appears chronic. Electronically Signed   By: Telford Nab M.D.   On: 02/05/2022 05:00    PROCEDURES and INTERVENTIONS:  Reduction of fracture  Date/Time: 02/05/2022 6:55 AM  Performed by: Vladimir Crofts, MD Authorized by: Vladimir Crofts, MD  Risks and benefits: risks, benefits and alternatives were discussed Consent given by: guardian (Daughter) Time out: Immediately prior to procedure a "time out" was called to verify the correct patient, procedure, equipment, support staff and site/side marked as required. Patient tolerance: patient tolerated the procedure well with no immediate complications Comments: After analgesia with  ketamine, just prior to placement of knee immobilizer and with traction I reduce fracture     Medications  ketamine 50 mg in normal saline 5 mL (10 mg/mL) syringe (15 mg Intravenous Given 02/05/22 0531)  ondansetron (ZOFRAN) injection 4 mg (4 mg Intravenous Given 02/05/22 0640)     IMPRESSION / MDM / ASSESSMENT AND PLAN / ED COURSE  I reviewed the triage vital signs and the nursing notes.  Differential diagnosis includes, but is not limited to, hip fracture, femur fracture, stroke, mechanical fall, musculoskeletal  {Patient presents with symptoms of an acute illness or injury that is potentially life-threatening.  61 year old woman on home hospice due to metastatic cancer presents with a minor injury causing a suspected pathologic fracture of her distal femur suitable for conservative measures and return to home hospice.  Pain is controlled with IV ketamine.  X-ray confirms a distal femur fracture with significant displacement.  After discussing plan of care, risks and benefits and conferring with family we will manage nonoperatively with bracing and return to home hospice.  Provided orthopedic information and she is suitable for outpatient management care of hospice and her daughter.   Clinical Course as of 02/05/22 0724  Thu Feb 05, 2022  0530 Discussed plan of care with patient, daughter at the bedside (who is a radiologist) and another sister over the phone who is a Software engineer.  They are agreeable with IV analgesic ketamine, knee immobilizer placement and return to outpatient management for continued hospice care. [DS]  1610 Knee immobilizer placed by me with assistance of nursing staff.  Well-tolerated. [DS]  9604 Reassessed. [DS]    Clinical Course User Index [DS] Vladimir Crofts, MD     FINAL CLINICAL IMPRESSION(S) / ED DIAGNOSES   Final diagnoses:  Fall, initial encounter  Other closed fracture of distal end of left femur, initial encounter ([place])     Rx / DC Orders   ED  Discharge Orders     None        Note:  This document was prepared using Dragon voice recognition software and may include unintentional dictation errors.   Vladimir Crofts, MD 02/05/22 [phone]

## 2022-02-05 NOTE — ED Triage Notes (Signed)
Pt presents via EMS from home with complaints of left leg/knee pain that started earlier this week. She states going to the restroom tonight and her knee gave out on her and has had severe pain since that time. Pt is a hospice pt - wears 5L [place] @ baseline. Pt took her prescribed morphine and zofran without improvement. Denies CP or SOB.

## 2022-02-06 ENCOUNTER — Other Ambulatory Visit: Payer: Self-pay

## 2022-02-06 DIAGNOSIS — C7951 Secondary malignant neoplasm of bone: Secondary | ICD-10-CM | POA: Diagnosis not present

## 2022-02-06 DIAGNOSIS — K219 Gastro-esophageal reflux disease without esophagitis: Secondary | ICD-10-CM | POA: Diagnosis not present

## 2022-02-06 DIAGNOSIS — C771 Secondary and unspecified malignant neoplasm of intrathoracic lymph nodes: Secondary | ICD-10-CM | POA: Diagnosis not present

## 2022-02-06 DIAGNOSIS — D630 Anemia in neoplastic disease: Secondary | ICD-10-CM | POA: Diagnosis not present

## 2022-02-06 DIAGNOSIS — C7800 Secondary malignant neoplasm of unspecified lung: Secondary | ICD-10-CM | POA: Diagnosis not present

## 2022-02-06 DIAGNOSIS — J45909 Unspecified asthma, uncomplicated: Secondary | ICD-10-CM | POA: Diagnosis not present

## 2022-02-06 DIAGNOSIS — C50919 Malignant neoplasm of unspecified site of unspecified female breast: Secondary | ICD-10-CM | POA: Diagnosis not present

## 2022-02-06 DIAGNOSIS — R1310 Dysphagia, unspecified: Secondary | ICD-10-CM | POA: Diagnosis not present

## 2022-02-06 DIAGNOSIS — J90 Pleural effusion, not elsewhere classified: Secondary | ICD-10-CM | POA: Diagnosis not present

## 2022-02-07 ENCOUNTER — Encounter: Payer: Self-pay | Admitting: Oncology

## 2022-02-07 DIAGNOSIS — D630 Anemia in neoplastic disease: Secondary | ICD-10-CM | POA: Diagnosis not present

## 2022-02-07 DIAGNOSIS — R1310 Dysphagia, unspecified: Secondary | ICD-10-CM | POA: Diagnosis not present

## 2022-02-07 DIAGNOSIS — J90 Pleural effusion, not elsewhere classified: Secondary | ICD-10-CM | POA: Diagnosis not present

## 2022-02-07 DIAGNOSIS — C7800 Secondary malignant neoplasm of unspecified lung: Secondary | ICD-10-CM | POA: Diagnosis not present

## 2022-02-07 DIAGNOSIS — C7951 Secondary malignant neoplasm of bone: Secondary | ICD-10-CM | POA: Diagnosis not present

## 2022-02-07 DIAGNOSIS — K219 Gastro-esophageal reflux disease without esophagitis: Secondary | ICD-10-CM | POA: Diagnosis not present

## 2022-02-07 DIAGNOSIS — J45909 Unspecified asthma, uncomplicated: Secondary | ICD-10-CM | POA: Diagnosis not present

## 2022-02-07 DIAGNOSIS — C771 Secondary and unspecified malignant neoplasm of intrathoracic lymph nodes: Secondary | ICD-10-CM | POA: Diagnosis not present

## 2022-02-07 DIAGNOSIS — C50919 Malignant neoplasm of unspecified site of unspecified female breast: Secondary | ICD-10-CM | POA: Diagnosis not present

## 2022-02-09 ENCOUNTER — Other Ambulatory Visit: Payer: 59

## 2022-02-09 ENCOUNTER — Ambulatory Visit: Payer: 59 | Admitting: Oncology

## 2022-02-09 ENCOUNTER — Ambulatory Visit: Payer: 59

## 2022-02-10 ENCOUNTER — Ambulatory Visit: Payer: 59 | Admitting: Oncology

## 2022-02-10 ENCOUNTER — Other Ambulatory Visit: Payer: 59

## 2022-02-10 ENCOUNTER — Ambulatory Visit: Payer: 59

## 2022-02-11 ENCOUNTER — Other Ambulatory Visit: Payer: Self-pay

## 2022-02-13 ENCOUNTER — Other Ambulatory Visit: Payer: Self-pay

## 2022-02-17 ENCOUNTER — Other Ambulatory Visit: Payer: 59

## 2022-02-17 ENCOUNTER — Ambulatory Visit: Payer: 59 | Admitting: Oncology

## 2022-02-17 ENCOUNTER — Other Ambulatory Visit: Payer: Self-pay

## 2022-02-17 ENCOUNTER — Ambulatory Visit: Payer: 59

## 2022-02-18 ENCOUNTER — Ambulatory Visit: Payer: 59

## 2022-02-18 ENCOUNTER — Other Ambulatory Visit: Payer: 59

## 2022-02-18 ENCOUNTER — Ambulatory Visit: Payer: 59 | Admitting: Oncology

## 2022-02-23 DEATH — deceased

## 2022-02-24 ENCOUNTER — Ambulatory Visit: Payer: Self-pay

## 2022-02-24 ENCOUNTER — Ambulatory Visit: Payer: Self-pay | Admitting: Oncology

## 2022-02-24 ENCOUNTER — Other Ambulatory Visit: Payer: Self-pay

## 2022-02-25 ENCOUNTER — Ambulatory Visit: Payer: Self-pay

## 2022-02-25 ENCOUNTER — Ambulatory Visit: Payer: Self-pay | Admitting: Oncology

## 2022-03-03 ENCOUNTER — Ambulatory Visit: Payer: Self-pay

## 2022-03-03 ENCOUNTER — Ambulatory Visit: Payer: Self-pay | Admitting: Oncology

## 2022-04-01 ENCOUNTER — Ambulatory Visit: Payer: 59 | Admitting: Radiation Oncology

## 2022-04-17 ENCOUNTER — Encounter: Payer: 59 | Admitting: Family Medicine

## 2023-02-02 ENCOUNTER — Encounter: Payer: Self-pay | Admitting: Dermatology
